# Patient Record
Sex: Male | Born: 1940
Health system: Southern US, Community
[De-identification: ages and names within clinical notes are randomized; demographics above are authoritative.]

## PROBLEM LIST (undated history)

## (undated) DIAGNOSIS — T7840XA Allergy, unspecified, initial encounter: Secondary | ICD-10-CM

## (undated) DIAGNOSIS — H409 Unspecified glaucoma: Secondary | ICD-10-CM

## (undated) DIAGNOSIS — R040 Epistaxis: Secondary | ICD-10-CM

## (undated) DIAGNOSIS — L309 Dermatitis, unspecified: Secondary | ICD-10-CM

## (undated) DIAGNOSIS — N281 Cyst of kidney, acquired: Secondary | ICD-10-CM

## (undated) DIAGNOSIS — K219 Gastro-esophageal reflux disease without esophagitis: Secondary | ICD-10-CM

## (undated) DIAGNOSIS — M5431 Sciatica, right side: Secondary | ICD-10-CM

## (undated) DIAGNOSIS — J101 Influenza due to other identified influenza virus with other respiratory manifestations: Secondary | ICD-10-CM

## (undated) DIAGNOSIS — E669 Obesity, unspecified: Secondary | ICD-10-CM

## (undated) DIAGNOSIS — Z8709 Personal history of other diseases of the respiratory system: Secondary | ICD-10-CM

## (undated) DIAGNOSIS — H8110 Benign paroxysmal vertigo, unspecified ear: Secondary | ICD-10-CM

## (undated) DIAGNOSIS — K760 Fatty (change of) liver, not elsewhere classified: Secondary | ICD-10-CM

## (undated) DIAGNOSIS — I499 Cardiac arrhythmia, unspecified: Secondary | ICD-10-CM

## (undated) DIAGNOSIS — I493 Ventricular premature depolarization: Secondary | ICD-10-CM

## (undated) DIAGNOSIS — R413 Other amnesia: Secondary | ICD-10-CM

## (undated) DIAGNOSIS — Z87442 Personal history of urinary calculi: Secondary | ICD-10-CM

## (undated) DIAGNOSIS — Z860100 Personal history of colon polyps, unspecified: Secondary | ICD-10-CM

## (undated) DIAGNOSIS — Z973 Presence of spectacles and contact lenses: Secondary | ICD-10-CM

## (undated) DIAGNOSIS — Z974 Presence of external hearing-aid: Secondary | ICD-10-CM

## (undated) DIAGNOSIS — J45909 Unspecified asthma, uncomplicated: Secondary | ICD-10-CM

## (undated) DIAGNOSIS — N400 Enlarged prostate without lower urinary tract symptoms: Secondary | ICD-10-CM

## (undated) DIAGNOSIS — H269 Unspecified cataract: Secondary | ICD-10-CM

## (undated) DIAGNOSIS — G4730 Sleep apnea, unspecified: Secondary | ICD-10-CM

## (undated) DIAGNOSIS — M1990 Unspecified osteoarthritis, unspecified site: Secondary | ICD-10-CM

## (undated) DIAGNOSIS — I2510 Atherosclerotic heart disease of native coronary artery without angina pectoris: Secondary | ICD-10-CM

## (undated) DIAGNOSIS — I10 Essential (primary) hypertension: Secondary | ICD-10-CM

## (undated) DIAGNOSIS — E349 Endocrine disorder, unspecified: Secondary | ICD-10-CM

## (undated) DIAGNOSIS — N200 Calculus of kidney: Secondary | ICD-10-CM

## (undated) DIAGNOSIS — N3281 Overactive bladder: Secondary | ICD-10-CM

## (undated) DIAGNOSIS — Z8719 Personal history of other diseases of the digestive system: Secondary | ICD-10-CM

## (undated) DIAGNOSIS — E785 Hyperlipidemia, unspecified: Secondary | ICD-10-CM

## (undated) DIAGNOSIS — K5790 Diverticulosis of intestine, part unspecified, without perforation or abscess without bleeding: Secondary | ICD-10-CM

## (undated) HISTORY — DX: Gastro-esophageal reflux disease without esophagitis: K21.9

## (undated) HISTORY — DX: Allergy, unspecified, initial encounter: T78.40XA

## (undated) HISTORY — DX: Atherosclerotic heart disease of native coronary artery without angina pectoris: I25.10

## (undated) HISTORY — DX: Presence of external hearing-aid: Z97.4

## (undated) HISTORY — DX: Unspecified cataract: H26.9

## (undated) HISTORY — DX: Essential (primary) hypertension: I10

## (undated) HISTORY — DX: Unspecified glaucoma: H40.9

## (undated) HISTORY — DX: Other amnesia: R41.3

## (undated) HISTORY — DX: Sleep apnea, unspecified: G47.30

## (undated) HISTORY — PX: OTHER SURGICAL HISTORY: SHX169

## (undated) HISTORY — DX: Cardiac arrhythmia, unspecified: I49.9

## (undated) HISTORY — PX: VASECTOMY: SHX75

## (undated) HISTORY — PX: COLONOSCOPY: SHX174

## (undated) HISTORY — PX: LITHOTRIPSY: SUR834

## (undated) HISTORY — PX: APPENDECTOMY: SHX54

---
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
## 2005-06-23 ENCOUNTER — Other Ambulatory Visit: Payer: Self-pay

## 2005-06-23 ENCOUNTER — Emergency Department: Payer: Self-pay | Admitting: Emergency Medicine

## 2005-11-11 ENCOUNTER — Ambulatory Visit: Payer: Self-pay | Admitting: Gastroenterology

## 2005-11-11 LAB — HM COLONOSCOPY: HM Colonoscopy: NORMAL

## 2006-09-01 ENCOUNTER — Ambulatory Visit: Payer: Self-pay | Admitting: Family Medicine

## 2006-09-07 ENCOUNTER — Ambulatory Visit: Payer: Self-pay | Admitting: Family Medicine

## 2006-09-07 LAB — CONVERTED CEMR LAB
ALT: 16 units/L (ref 0–40)
AST: 21 units/L (ref 0–37)
Alkaline Phosphatase: 76 units/L (ref 39–117)
Calcium: 9.6 mg/dL (ref 8.4–10.5)
Cholesterol: 171 mg/dL (ref 0–200)
GFR calc Af Amer: 65 mL/min
Glucose, Bld: 104 mg/dL — ABNORMAL HIGH (ref 70–99)
HDL: 32.4 mg/dL — ABNORMAL LOW (ref >39.0)
LDL Cholesterol: 107 mg/dL — ABNORMAL HIGH (ref 0–99)
Potassium: 4.2 meq/L (ref 3.5–5.1)
Sodium: 140 meq/L (ref 135–145)
Total Protein: 7.3 g/dL (ref 6.0–8.3)
VLDL: 32 mg/dL (ref 0–40)

## 2006-12-20 ENCOUNTER — Emergency Department (HOSPITAL_COMMUNITY): Admission: EM | Admit: 2006-12-20 | Discharge: 2006-12-20 | Payer: Self-pay | Admitting: Family Medicine

## 2007-02-16 ENCOUNTER — Ambulatory Visit: Payer: Self-pay | Admitting: *Deleted

## 2007-02-16 DIAGNOSIS — L299 Pruritus, unspecified: Secondary | ICD-10-CM | POA: Insufficient documentation

## 2007-02-16 DIAGNOSIS — E781 Pure hyperglyceridemia: Secondary | ICD-10-CM

## 2007-02-16 DIAGNOSIS — M1990 Unspecified osteoarthritis, unspecified site: Secondary | ICD-10-CM | POA: Insufficient documentation

## 2007-02-16 DIAGNOSIS — E6609 Other obesity due to excess calories: Secondary | ICD-10-CM | POA: Insufficient documentation

## 2007-02-16 DIAGNOSIS — K219 Gastro-esophageal reflux disease without esophagitis: Secondary | ICD-10-CM

## 2007-02-16 DIAGNOSIS — J45909 Unspecified asthma, uncomplicated: Secondary | ICD-10-CM | POA: Insufficient documentation

## 2007-02-16 DIAGNOSIS — Z6832 Body mass index (BMI) 32.0-32.9, adult: Secondary | ICD-10-CM | POA: Insufficient documentation

## 2007-02-16 DIAGNOSIS — N400 Enlarged prostate without lower urinary tract symptoms: Secondary | ICD-10-CM | POA: Insufficient documentation

## 2007-02-16 DIAGNOSIS — Q809 Congenital ichthyosis, unspecified: Secondary | ICD-10-CM

## 2007-02-16 DIAGNOSIS — I10 Essential (primary) hypertension: Secondary | ICD-10-CM | POA: Insufficient documentation

## 2007-02-16 DIAGNOSIS — E669 Obesity, unspecified: Secondary | ICD-10-CM

## 2007-02-17 LAB — CONVERTED CEMR LAB
PSA: 3.28 ng/mL (ref 0.10–4.00)
Total Bilirubin: 0.8 mg/dL (ref 0.3–1.2)
Total Protein: 7.6 g/dL (ref 6.0–8.3)

## 2007-03-01 ENCOUNTER — Telehealth: Payer: Self-pay | Admitting: Family Medicine

## 2007-04-07 ENCOUNTER — Ambulatory Visit: Payer: Self-pay | Admitting: Family Medicine

## 2007-04-07 DIAGNOSIS — I10 Essential (primary) hypertension: Secondary | ICD-10-CM | POA: Insufficient documentation

## 2007-04-07 DIAGNOSIS — H918X9 Other specified hearing loss, unspecified ear: Secondary | ICD-10-CM | POA: Insufficient documentation

## 2007-04-07 LAB — CONVERTED CEMR LAB
HDL goal, serum: 40 mg/dL
LDL Goal: 130 mg/dL

## 2007-08-03 ENCOUNTER — Emergency Department: Payer: Self-pay | Admitting: Emergency Medicine

## 2007-08-10 ENCOUNTER — Telehealth: Payer: Self-pay | Admitting: Family Medicine

## 2007-08-16 ENCOUNTER — Ambulatory Visit: Payer: Self-pay | Admitting: Family Medicine

## 2007-08-17 ENCOUNTER — Encounter (INDEPENDENT_AMBULATORY_CARE_PROVIDER_SITE_OTHER): Payer: Self-pay | Admitting: Internal Medicine

## 2007-09-12 ENCOUNTER — Encounter (INDEPENDENT_AMBULATORY_CARE_PROVIDER_SITE_OTHER): Payer: Self-pay | Admitting: *Deleted

## 2007-09-14 ENCOUNTER — Ambulatory Visit: Payer: Self-pay | Admitting: Family Medicine

## 2007-09-15 ENCOUNTER — Encounter: Admission: RE | Admit: 2007-09-15 | Discharge: 2007-09-15 | Payer: Self-pay | Admitting: Family Medicine

## 2007-09-16 ENCOUNTER — Ambulatory Visit: Payer: Self-pay | Admitting: Family Medicine

## 2007-09-21 DIAGNOSIS — E349 Endocrine disorder, unspecified: Secondary | ICD-10-CM | POA: Insufficient documentation

## 2007-09-21 LAB — CONVERTED CEMR LAB
ALT: 15 units/L (ref 0–53)
Basophils Relative: 0.2 % (ref 0.0–1.0)
Bilirubin, Direct: 0.1 mg/dL (ref 0.0–0.3)
CO2: 30 meq/L (ref 19–32)
Calcium: 9.6 mg/dL (ref 8.4–10.5)
Eosinophils Relative: 2.7 % (ref 0.0–5.0)
GFR calc Af Amer: 86 mL/min
Glucose, Bld: 96 mg/dL (ref 70–99)
HCT: 41.9 % (ref 39.0–52.0)
Hemoglobin: 13.9 g/dL (ref 13.0–17.0)
Lymphocytes Relative: 21.5 % (ref 12.0–46.0)
Monocytes Absolute: 0.5 10*3/uL (ref 0.2–0.7)
Neutro Abs: 5 10*3/uL (ref 1.4–7.7)
Neutrophils Relative %: 69 % (ref 43.0–77.0)
Potassium: 4.2 meq/L (ref 3.5–5.1)
Sodium: 142 meq/L (ref 135–145)
TSH: 0.87 microintl units/mL (ref 0.35–5.50)
Total Protein: 7.5 g/dL (ref 6.0–8.3)
VLDL: 24 mg/dL (ref 0–40)
Vitamin B-12: 316 pg/mL (ref 211–911)
WBC: 7.2 10*3/uL (ref 4.5–10.5)

## 2007-12-09 ENCOUNTER — Ambulatory Visit: Payer: Self-pay | Admitting: Family Medicine

## 2007-12-09 DIAGNOSIS — E7800 Pure hypercholesterolemia, unspecified: Secondary | ICD-10-CM | POA: Insufficient documentation

## 2007-12-16 LAB — CONVERTED CEMR LAB
ALT: 13 units/L (ref 0–53)
AST: 17 units/L (ref 0–37)
LDL Cholesterol: 117 mg/dL — ABNORMAL HIGH (ref 0–99)
VLDL: 23 mg/dL (ref 0–40)

## 2008-03-01 ENCOUNTER — Telehealth: Payer: Self-pay | Admitting: Family Medicine

## 2008-03-16 ENCOUNTER — Ambulatory Visit: Payer: Self-pay | Admitting: Family Medicine

## 2008-03-19 ENCOUNTER — Ambulatory Visit: Payer: Self-pay | Admitting: Family Medicine

## 2008-03-21 LAB — CONVERTED CEMR LAB
ALT: 13 units/L (ref 0–53)
AST: 18 units/L (ref 0–37)
Bilirubin, Direct: 0.1 mg/dL (ref 0.0–0.3)
HDL: 27.5 mg/dL — ABNORMAL LOW (ref >39.0)
Total Bilirubin: 1.2 mg/dL (ref 0.3–1.2)

## 2008-05-28 ENCOUNTER — Telehealth: Payer: Self-pay | Admitting: Family Medicine

## 2008-05-30 ENCOUNTER — Encounter (INDEPENDENT_AMBULATORY_CARE_PROVIDER_SITE_OTHER): Payer: Self-pay | Admitting: *Deleted

## 2008-06-02 ENCOUNTER — Emergency Department (HOSPITAL_COMMUNITY): Admission: EM | Admit: 2008-06-02 | Discharge: 2008-06-02 | Payer: Self-pay | Admitting: Psychology

## 2008-06-04 ENCOUNTER — Telehealth: Payer: Self-pay | Admitting: Family Medicine

## 2008-06-12 ENCOUNTER — Ambulatory Visit: Payer: Self-pay | Admitting: Family Medicine

## 2008-06-14 ENCOUNTER — Telehealth: Payer: Self-pay | Admitting: Family Medicine

## 2008-09-20 ENCOUNTER — Ambulatory Visit: Payer: Self-pay | Admitting: Family Medicine

## 2008-09-24 LAB — CONVERTED CEMR LAB
Alkaline Phosphatase: 93 units/L (ref 39–117)
Bilirubin, Direct: 0.1 mg/dL (ref 0.0–0.3)
GFR calc Af Amer: 86 mL/min
GFR calc non Af Amer: 71 mL/min
LDL Cholesterol: 122 mg/dL — ABNORMAL HIGH (ref 0–99)
Potassium: 4.3 meq/L (ref 3.5–5.1)
Sodium: 142 meq/L (ref 135–145)
VLDL: 17 mg/dL (ref 0–40)

## 2008-09-27 ENCOUNTER — Ambulatory Visit: Payer: Self-pay | Admitting: Family Medicine

## 2008-10-13 ENCOUNTER — Telehealth: Payer: Self-pay | Admitting: Internal Medicine

## 2009-01-08 ENCOUNTER — Encounter: Payer: Self-pay | Admitting: Family Medicine

## 2009-01-16 ENCOUNTER — Encounter: Payer: Self-pay | Admitting: Family Medicine

## 2009-01-23 ENCOUNTER — Encounter: Payer: Self-pay | Admitting: Family Medicine

## 2009-02-01 ENCOUNTER — Ambulatory Visit: Payer: Self-pay | Admitting: Family Medicine

## 2009-02-21 ENCOUNTER — Telehealth: Payer: Self-pay | Admitting: Family Medicine

## 2009-04-03 ENCOUNTER — Encounter (INDEPENDENT_AMBULATORY_CARE_PROVIDER_SITE_OTHER): Payer: Self-pay | Admitting: *Deleted

## 2009-04-05 ENCOUNTER — Ambulatory Visit: Payer: Self-pay | Admitting: Family Medicine

## 2009-04-08 ENCOUNTER — Ambulatory Visit: Payer: Self-pay | Admitting: Family Medicine

## 2009-04-08 LAB — CONVERTED CEMR LAB
Albumin: 4 g/dL (ref 3.5–5.2)
Alkaline Phosphatase: 77 units/L (ref 39–117)
Bilirubin, Direct: 0.2 mg/dL (ref 0.0–0.3)
Calcium: 9.1 mg/dL (ref 8.4–10.5)
GFR calc non Af Amer: 70.74 mL/min (ref >60)
Glucose, Bld: 89 mg/dL (ref 70–99)
HDL: 36.9 mg/dL — ABNORMAL LOW (ref >39.00)
Sodium: 141 meq/L (ref 135–145)
Total CHOL/HDL Ratio: 5
VLDL: 17 mg/dL (ref 0.0–40.0)

## 2009-04-09 ENCOUNTER — Telehealth: Payer: Self-pay | Admitting: Family Medicine

## 2009-04-19 ENCOUNTER — Encounter: Payer: Self-pay | Admitting: Family Medicine

## 2009-06-05 ENCOUNTER — Ambulatory Visit: Payer: Self-pay | Admitting: Family Medicine

## 2009-07-04 ENCOUNTER — Ambulatory Visit: Payer: Self-pay | Admitting: Family Medicine

## 2009-07-23 ENCOUNTER — Encounter: Payer: Self-pay | Admitting: Family Medicine

## 2009-09-11 ENCOUNTER — Encounter (INDEPENDENT_AMBULATORY_CARE_PROVIDER_SITE_OTHER): Payer: Self-pay | Admitting: *Deleted

## 2009-09-12 ENCOUNTER — Telehealth (INDEPENDENT_AMBULATORY_CARE_PROVIDER_SITE_OTHER): Payer: Self-pay | Admitting: *Deleted

## 2009-09-27 ENCOUNTER — Ambulatory Visit: Payer: Self-pay | Admitting: Family Medicine

## 2009-09-27 LAB — CONVERTED CEMR LAB
Albumin: 4.2 g/dL (ref 3.5–5.2)
BUN: 12 mg/dL (ref 6–23)
Bilirubin, Direct: 0.2 mg/dL (ref 0.0–0.3)
CO2: 28 meq/L (ref 19–32)
Calcium: 9.2 mg/dL (ref 8.4–10.5)
Cholesterol: 189 mg/dL (ref 0–200)
Creatinine, Ser: 1.1 mg/dL (ref 0.4–1.5)
Glucose, Bld: 94 mg/dL (ref 70–99)
HDL: 42.1 mg/dL (ref >39.00)
Total CHOL/HDL Ratio: 4
Total Protein: 7.3 g/dL (ref 6.0–8.3)
Triglycerides: 95 mg/dL (ref 0.0–149.0)

## 2009-10-01 ENCOUNTER — Ambulatory Visit: Payer: Self-pay | Admitting: Family Medicine

## 2009-11-08 ENCOUNTER — Encounter: Payer: Self-pay | Admitting: Family Medicine

## 2009-11-29 ENCOUNTER — Ambulatory Visit (HOSPITAL_BASED_OUTPATIENT_CLINIC_OR_DEPARTMENT_OTHER): Admission: RE | Admit: 2009-11-29 | Discharge: 2009-11-30 | Payer: Self-pay | Admitting: Urology

## 2009-11-29 HISTORY — PX: TRANSURETHRAL RESECTION OF PROSTATE: SHX73

## 2009-12-23 ENCOUNTER — Encounter: Payer: Self-pay | Admitting: Family Medicine

## 2010-04-01 ENCOUNTER — Ambulatory Visit: Payer: Self-pay | Admitting: Family Medicine

## 2010-04-07 LAB — CONVERTED CEMR LAB
ALT: 21 units/L (ref 0–53)
Albumin: 4.3 g/dL (ref 3.5–5.2)
BUN: 16 mg/dL (ref 6–23)
CO2: 24 meq/L (ref 19–32)
Calcium: 9.3 mg/dL (ref 8.4–10.5)
Chloride: 104 meq/L (ref 96–112)
Cholesterol: 181 mg/dL (ref 0–200)
Creatinine, Ser: 1 mg/dL (ref 0.4–1.5)
Glucose, Bld: 84 mg/dL (ref 70–99)
HDL: 37.1 mg/dL — ABNORMAL LOW (ref >39.00)
Total Protein: 6.8 g/dL (ref 6.0–8.3)
Triglycerides: 83 mg/dL (ref 0.0–149.0)

## 2010-07-08 ENCOUNTER — Encounter: Payer: Self-pay | Admitting: Family Medicine

## 2010-08-24 HISTORY — PX: ROTATOR CUFF REPAIR: SHX139

## 2010-08-26 ENCOUNTER — Ambulatory Visit
Admission: RE | Admit: 2010-08-26 | Discharge: 2010-08-26 | Payer: Self-pay | Source: Home / Self Care | Attending: Internal Medicine | Admitting: Internal Medicine

## 2010-09-23 NOTE — Progress Notes (Signed)
Summary: lab orderd for 09/12/09  ---- Converted from flag ---- ---- 09/11/2009 1:08 PM, Kerby Nora MD wrote: Dx 272.0 lipids, CMET.  ---- 09/10/2009 8:39 AM, Liane Comber CMA (AAMA) wrote: Pt is scheduled for cpx labs tomorrow, what labs to draw and dx codes? Thanks Tasha ------------------------------

## 2010-09-23 NOTE — Letter (Signed)
Summary: [facility]  [facility]   Imported By: Lanelle Bal 01/09/2010 09:53:35  _____________________________________________________________________  External Attachment:    Type:   Image     Comment:   External Document

## 2010-09-23 NOTE — Letter (Signed)
Summary: [place] No Show Letter  [place] [facility]  [address] [place], [place] [address]   Phone: [phone]  Fax: [phone]    09/11/2009 MRN: [id number]  [facility] [address] [place], [place]  [address]   Dear Mr. Dethloff,   Our records indicate that you missed your scheduled appointment with ____Lab_________________ on _11.19.11___________.  Please contact this office to reschedule your appointment as soon as possible.  It is important that you keep your scheduled appointments with your physician, so we can provide you the best care possible.  Please be advised that there may be a charge for "no show" appointments.    Sincerely,   [place] at [facility]

## 2010-09-23 NOTE — Letter (Signed)
Summary: [facility]  [facility]   Imported By: Maryln Gottron 11/12/2009 15:38:38  _____________________________________________________________________  External Attachment:    Type:   Image     Comment:   External Document

## 2010-09-23 NOTE — Letter (Signed)
Summary: [facility]  [facility]   Imported By: Maryln Gottron 07/18/2010 09:55:31  _____________________________________________________________________  External Attachment:    Type:   Image     Comment:   External Document

## 2010-09-23 NOTE — Assessment & Plan Note (Signed)
Summary: cpx/dlo   Vital Signs:  Patient profile:   70 year old male Weight:      214 pounds BMI:     30.82 Temp:     98.0 degrees F oral Pulse rate:   64 / minute Pulse rhythm:   regular BP sitting:   130 / 80  (left arm) Cuff size:   large CC: 30 minute exam, Hypertension Management   History of Present Illness: Nasal discharge, post nasal drip x 6 days. Not worseing, getting some better.  No sinus pain, no ear pain. No ST. No fever.   Recent skin exam at [facility]. Given eczema. Crypotherapy for AKs.   BPH..increasing issues with urinary urgency.  Sees Dr. Hillis Range..considering TURP for treatment. Stable PSA.. bx 1 year ago negative.   High chol.Conchita Paris well controlled on gemfibrozil and HDL increasing on niaspan. Some weight gain over holidays.Marland Kitchenexercsiing regularly.   Hypertension History:      He denies headache, chest pain, palpitations, syncope, and side effects from treatment.  Well controlled at home.        Positive major cardiovascular risk factors include male age 70 years old or older, hyperlipidemia, and hypertension.  Negative major cardiovascular risk factors include negative family history for ischemic heart disease and non-tobacco-user status.     Problems Prior to Update: 1)  External Hemorrhoids With Other Complication  (ICD-455.5) 2)  Cough  (ICD-786.2) 3)  Hemorrhoid, External, Thrombosed  (ICD-455.4) 4)  Back Pain, Upper  (ICD-724.5) 5)  Hypercholesterolemia  (ICD-272.0) 6)  Testosterone Deficiency  (ICD-257.2) 7)  Other Malaise and Fatigue  (ICD-780.79) 8)  Screening For Lipoid Disorders  (ICD-V77.91) 9)  Testicular Pain, Right  (ICD-608.9) 10)  Encounter For Removal of Sutures  (ICD-V58.32) 11)  Loss, Hearing Nec  (ICD-389.8) 12)  Onychomycosis, Toenails  (ICD-110.1) 13)  B P H  (ICD-600.01) 14)  Screening For Malignant Neoplasm, Prostate  (ICD-V76.44) 15)  Ichthyosis  (ICD-757.1) 16)  Hypertriglyceridemia  (ICD-272.1) 17)  Asthma, Childhood   (ICD-493.00) 18)  Obesity  (ICD-278.00) 19)  Osteoarthritis  (ICD-715.90) 20)  Hypertension  (ICD-401.9) 21)  Gerd  (ICD-530.81)  Current Medications (verified): 1)  Benicar 20 Mg  Tabs (Olmesartan Medoxomil) .... Take 1 Tablet By Mouth Once A Day 2)  Aspirin Childrens 81 Mg Chew (Aspirin) .... Take 1 Tablet By Mouth Once A Day 3)  Gemfibrozil 600 Mg Tabs (Gemfibrozil) .... Take 1 Tablet By Mouth Once A Day 4)  Niaspan 1000 Mg  Tbcr (Niacin (Antihyperlipidemic)) .... Take 1 Tablet By Mouth Twice A Day At Bedtime 5)  Testim 1 %  Gel (Testosterone) .... Once Daily 6)  Jalyn 0.5-0.4 Mg Caps (Dutasteride-Tamsulosin Hcl) .... Once A Day 7)  Hycodan Syrup .Marland Kitchen.. 1 - 2 Tsp By Mouth At Bedtime As Needed Cough 8)  Proair Hfa 108 (90 Base) Mcg/act  Aers (Albuterol Sulfate) .... 2 Inh Q4h As Needed Shortness of Breath  Allergies: 1)  Paxil  Past History:  Past medical, surgical, family and social histories (including risk factors) reviewed, and no changes noted (except as noted below).  Past Medical History: Reviewed history from 09/14/2007 and no changes required. GERD Hypertension Osteoarthritis  Past Surgical History: Reviewed history from 09/27/2008 and no changes required. Vasectomy 1976 Stress negative 06/2005 ECHO 06/2005 Lithotripsy- left kidney x 2 2003 bone spur removal  2008 Left and right foot 1st digit external hemmorhoid thrombose...irrigated 01/2008  Family History: Reviewed history from 02/16/2007 and no changes required. Father: Died at age 84, lung cancer  Mother: Alive with breast cancer and kidney cancer Siblings: Twin, who is overweight Grandparent CVA, CHF  Social History: Reviewed history from 02/16/2007 and no changes required. Marital Status: Married Children: 3 Occupation: Acupuncturist  Review of Systems General:  Denies fatigue and fever. CV:  Denies chest pain or discomfort. Resp:  Denies shortness of breath. GI:  Denies abdominal pain. GU:   Denies dysuria.  Physical Exam  General:  Well-developed,well-nourished,in no acute distress; alert,appropriate and cooperative throughout examination Ears:  External ear exam shows no significant lesions or deformities.  Otoscopic examination reveals clear canals, tympanic membranes are intact bilaterally without bulging, retraction, inflammation or discharge. Hearing is grossly normal bilaterally. Nose:  nasal dischargemucosal pallor.   Mouth:  Oral mucosa and oropharynx without lesions or exudates.  Teeth in good repair. MMM Neck:  no carotid bruit or thyromegaly no cervical or supraclavicular lymphadenopathy  Lungs:  Normal respiratory effort, chest expands symmetrically. Lungs are clear to auscultation, no crackles or wheezes. Heart:  Normal rate and regular rhythm. S1 and S2 normal without gallop, murmur, click, rub or other extra sounds. Abdomen:  Bowel sounds positive,abdomen soft and non-tender without masses, organomegaly or hernias noted. Prostate:  per uro Msk:  No deformity or scoliosis noted of thoracic or lumbar spine.   Pulses:  R and L posterior tibial pulses are full and equal bilaterally  Extremities:  no edema  Skin:  Intact without suspicious lesions or rashes Psych:  Cognition and judgment appear intact. Alert and cooperative with normal attention span and concentration. No apparent delusions, illusions, hallucinations   Impression & Recommendations:  Problem # 1:  HYPERCHOLESTEROLEMIA (ICD-272.0) Improved on current regimen with lifestyle change.  His updated medication list for this problem includes:    Gemfibrozil 600 Mg Tabs (Gemfibrozil) .Marland Kitchen... Take 1 tablet by mouth once a day    Niaspan 1000 Mg Tbcr (Niacin (antihyperlipidemic)) .Marland Kitchen... Take 1 tablet by mouth twice a day at bedtime  Labs Reviewed: SGOT: 22 (09/27/2009)   SGPT: 19 (09/27/2009)  Lipid Goals: Chol Goal: 200 (04/08/2009)   HDL Goal: 40 (04/07/2007)   LDL Goal: 130 (04/07/2007)   TG Goal: 150  (04/07/2007)  Prior 10 Yr Risk Heart Disease: 22 % (04/08/2009)   HDL:42.10 (09/27/2009), 36.90 (04/05/2009)  LDL:128 (09/27/2009), 128 (04/05/2009)  Chol:189 (09/27/2009), 182 (04/05/2009)  Trig:95.0 (09/27/2009), 85.0 (04/05/2009)  Problem # 2:  HYPERTENSION (ICD-401.9) Well controlled. Continue current medication.  His updated medication list for this problem includes:    Benicar 20 Mg Tabs (Olmesartan medoxomil) .Marland Kitchen... Take 1 tablet by mouth once a day  BP today: 130/80 Prior BP: 140/88 (07/04/2009)  Prior 10 Yr Risk Heart Disease: 22 % (04/08/2009)  Labs Reviewed: K+: 4.4 (09/27/2009) Creat: : 1.1 (09/27/2009)   Chol: 189 (09/27/2009)   HDL: 42.10 (09/27/2009)   LDL: 128 (09/27/2009)   TG: 95.0 (09/27/2009)  Problem # 3:  B P H (ICD-600.01) Surgery planned per [facility].   Problem # 4:  Preventive Health Care (ICD-V70.0) Assessment: Comment Only Reviewed preventive care protocols, scheduled due services, and updated immunizations.   Complete Medication List: 1)  Benicar 20 Mg Tabs (Olmesartan medoxomil) .... Take 1 tablet by mouth once a day 2)  Aspirin Childrens 81 Mg Chew (Aspirin) .... Take 1 tablet by mouth once a day 3)  Gemfibrozil 600 Mg Tabs (Gemfibrozil) .... Take 1 tablet by mouth once a day 4)  Niaspan 1000 Mg Tbcr (Niacin (antihyperlipidemic)) .... Take 1 tablet by mouth twice a day at bedtime 5)  Testim 1 % Gel (Testosterone) .... Once daily 6)  Jalyn 0.5-0.4 Mg Caps (Dutasteride-tamsulosin hcl) .... Once a day 7)  Hycodan Syrup  .Marland Kitchen.. 1 - 2 tsp by mouth at bedtime as needed cough 8)  Proair Hfa 108 (90 Base) Mcg/act Aers (Albuterol sulfate) .... 2 inh q4h as needed shortness of breath  Hypertension Assessment/Plan:      The patient's hypertensive risk group is category B: At least one risk factor (excluding diabetes) with no target organ damage.  His calculated 10 year risk of coronary heart disease is 22 %.  Today's blood pressure is 130/80.  His blood pressure goal  is < 140/90.  Patient Instructions: 1)  Fasitng lipids, CMET Dx 272.0 in 6 months.  2)  Please schedule a follow-up appointment in 1 year.   Current Allergies (reviewed today): PAXIL   Past Medical History:    Reviewed history from 09/14/2007 and no changes required:       GERD       Hypertension       Osteoarthritis  Past Surgical History:    Reviewed history from 09/27/2008 and no changes required:       Vasectomy 1976       Stress negative 06/2005       ECHO 06/2005       Lithotripsy- left kidney x 2 2003       bone spur removal  2008 Left and right foot 1st digit       external hemmorhoid thrombose...irrigated 01/2008

## 2010-09-25 NOTE — Assessment & Plan Note (Signed)
Summary: cold symptoms/alc   Vital Signs:  Patient profile:   70 year old male Height:      70 inches Weight:      217 pounds BMI:     31.25 Temp:     98.2 degrees F oral Pulse rate:   84 / minute Pulse rhythm:   regular BP sitting:   130 / 82  (left arm) Cuff size:   large  Vitals Entered By: Selena Batten Dance CMA (AAMA) (August 26, 2010 3:37 PM) CC: Cold symptom   History of Present Illness: CC: cold sxs, blood pressure "my main concern is blood pressure today"  1. blood pressure - has been running 155-160/90s at home.  Has gained a few lbs over holidays.  was on benicar/hctz now only benicar.  recently in pain and increase in NSAIDs.  no HA, vision changes, SOB, LE swelling.  2. cold - 1 wk h/o cold.  + sick contacts.  minimally productive cough.  Simple mucinex.  No fevers/chills.  worse at night.  + h/o asthma.  feels a bit tight in chest/back currently.  Tightness comes and goes.  Nothing relieves it.  Not exhertional.  doesn't happen when walking.  quit smoking remotely.  LDL last checked 03/2010, 127.  father with CAD/MI 70yo, CABG.  3. constant pain from torn rotator cuff L shoulder, seeing [place] [facility]. to see surgeon next week.  s/p [place] and minor tear dx.  vicodin at night helps him sleep.  during day taking alleve 1 in am and 1 in pm.  Cardiovascular Risk History:      Positive major cardiovascular risk factors include male age 87 years old or older, hyperlipidemia, and hypertension.  Negative major cardiovascular risk factors include no history of diabetes, negative family history for ischemic heart disease, and non-tobacco-user status.        Further assessment for target organ damage reveals no history of stroke/TIA, peripheral vascular disease, renal insufficiency, or hypertensive retinopathy.     Current Medications (verified): 1)  Benicar 20 Mg  Tabs (Olmesartan Medoxomil) .... Take 1 Tablet By Mouth Once A Day 2)  Aspirin Childrens 81 Mg Chew (Aspirin) .... Take 1 Tablet By  Mouth Once A Day 3)  Gemfibrozil 600 Mg Tabs (Gemfibrozil) .... Take 1 Tablet By Mouth Once A Day 4)  Niaspan 1000 Mg  Tbcr (Niacin (Antihyperlipidemic)) .... Take 1 Tablet By Mouth Twice A Day At Bedtime 5)  Testim 1 %  Gel (Testosterone) .... Once Daily 6)  Proair Hfa 108 (90 Base) Mcg/act  Aers (Albuterol Sulfate) .... 2 Inh Q4h As Needed Shortness of Breath  Allergies: 1)  Paxil  Past History:  Social History: Last updated: 02/16/2007 Marital Status: Married Children: 3 Occupation: Acupuncturist  Past Medical History: GERD Hypertension Osteoarthritis h/o asthma  Family History: Father: Died at age 58, lung cancer, CABG Mother: Alive with breast cancer and kidney cancer Siblings: Twin, who is overweight Grandparent CVA, CHF  Review of Systems       per HPI  Physical Exam  General:  Well-developed,well-nourished,in no acute distress; alert,appropriate and cooperative throughout examination Head:  no maxially sinus ttp Eyes:  No corneal or conjunctival inflammation noted. EOMI. Perrla.  Ears:  L TM clear R TM clear, significant amt cerumen Nose:  mucosal pallor Mouth:  MMM, no pharyngeal erythema/exudates Neck:  no carotid bruit or thyromegaly no cervical or supraclavicular lymphadenopathy  Lungs:  Normal respiratory effort, chest expands symmetrically. Lungs are clear to auscultation, no crackles  or wheezes. Heart:  Normal rate and regular rhythm. S1 and S2 normal without gallop, murmur, click, rub or other extra sounds. Pulses:  2+ rad pulses, brisk cap refill Extremities:  no edema  Skin:  Intact without suspicious lesions or rashes   Impression & Recommendations:  Problem # 1:  VIRAL URI (ICD-465.9) supportive care as per pt instructions.  lungs clear today, no asthma flare today.  chest/back tightness more consistent with pain from RTC tear, MSK rather than cardiac.  His updated medication list for this problem includes:    Aspirin Childrens 81 Mg  Chew (Aspirin) .Marland Kitchen... Take 1 tablet by mouth once a day  Problem # 2:  CERUMEN IMPACTION, RIGHT (ICD-380.4) attempted cleaning with plastic curette, however cerumen too thick.  successful irrigation  Orders: Cerumen Impaction Removal (11914)  Problem # 3:  HYPERTENSION (ICD-401.9) no changes currently, at goal.  discussed factor that pain and NSAIDs play on elevated BP.  f/u with pcp.   His updated medication list for this problem includes:    Benicar 20 Mg Tabs (Olmesartan medoxomil) .Marland Kitchen... Take 1 tablet by mouth once a day  BP today: 130/82 Prior BP: 130/80 (10/01/2009)  Prior 10 Yr Risk Heart Disease: 22 % (04/08/2009)  Labs Reviewed: K+: 4.8 (04/01/2010) Creat: : 1.0 (04/01/2010)   Chol: 181 (04/01/2010)   HDL: 37.10 (04/01/2010)   LDL: 127 (04/01/2010)   TG: 83.0 (04/01/2010)  Complete Medication List: 1)  Benicar 20 Mg Tabs (Olmesartan medoxomil) .... Take 1 tablet by mouth once a day 2)  Aspirin Childrens 81 Mg Chew (Aspirin) .... Take 1 tablet by mouth once a day 3)  Gemfibrozil 600 Mg Tabs (Gemfibrozil) .... Take 1 tablet by mouth once a day 4)  Niaspan 1000 Mg Tbcr (Niacin (antihyperlipidemic)) .... Take 1 tablet by mouth twice a day at bedtime 5)  Testim 1 % Gel (Testosterone) .... Once daily 6)  Proair Hfa 108 (90 Base) Mcg/act Aers (Albuterol sulfate) .... 2 inh q4h as needed shortness of breath  Cardiovascular Risk Assessment/Plan:      The patient's hypertensive risk group is category B: At least one risk factor (excluding diabetes) with no target organ damage.  His calculated 10 year risk of coronary heart disease is 22 %.  Today's blood pressure is 130/82.  His blood pressure goal is < 140/90.  Patient Instructions: 1)  better pain control may help with blood pressures 2)  changing from alleve to tylenol may help blood pressure control.  (watch out because vicodin has tylenol, max dose is 3 gm/day). 3)  Sounds like you have a viral upper respiratory infection. 4)   Antibiotics are not needed for this.  Viral infections usually take 7-10 days to resolve.  The cough can last 4 weeks to go away. 5)  Continue mucinex with plenty of fluid, get plenty of rest. 6)  Please return if you are not improving as expected, or if you have high fevers (>101.5) or difficulty swallowing. 7)  Call clinic with questions.  Pleasure to see you today!    Orders Added: 1)  Est. Patient Level III [78295] 2)  Cerumen Impaction Removal [[id number]]    Current Allergies (reviewed today): PAXIL

## 2010-09-30 ENCOUNTER — Telehealth (INDEPENDENT_AMBULATORY_CARE_PROVIDER_SITE_OTHER): Payer: Self-pay | Admitting: *Deleted

## 2010-10-03 ENCOUNTER — Other Ambulatory Visit (INDEPENDENT_AMBULATORY_CARE_PROVIDER_SITE_OTHER): Payer: 59

## 2010-10-03 ENCOUNTER — Encounter (INDEPENDENT_AMBULATORY_CARE_PROVIDER_SITE_OTHER): Payer: Self-pay | Admitting: *Deleted

## 2010-10-03 ENCOUNTER — Other Ambulatory Visit: Payer: Self-pay | Admitting: Family Medicine

## 2010-10-03 DIAGNOSIS — E781 Pure hyperglyceridemia: Secondary | ICD-10-CM

## 2010-10-03 LAB — HEPATIC FUNCTION PANEL
ALT: 21 U/L (ref 0–53)
Albumin: 4.3 g/dL (ref 3.5–5.2)
Alkaline Phosphatase: 83 U/L (ref 39–117)
Bilirubin, Direct: 0.2 mg/dL (ref 0.0–0.3)
Total Protein: 7 g/dL (ref 6.0–8.3)

## 2010-10-03 LAB — BASIC METABOLIC PANEL
CO2: 29 mEq/L (ref 19–32)
Calcium: 9.5 mg/dL (ref 8.4–10.5)
Chloride: 105 mEq/L (ref 96–112)
Creatinine, Ser: 1.1 mg/dL (ref 0.4–1.5)
Glucose, Bld: 89 mg/dL (ref 70–99)

## 2010-10-03 LAB — LIPID PANEL
HDL: 42.4 mg/dL (ref >39.00)
Triglycerides: 82 mg/dL (ref 0.0–149.0)

## 2010-10-07 ENCOUNTER — Encounter: Payer: Self-pay | Admitting: Family Medicine

## 2010-10-09 ENCOUNTER — Encounter (INDEPENDENT_AMBULATORY_CARE_PROVIDER_SITE_OTHER): Payer: 59 | Admitting: Family Medicine

## 2010-10-09 ENCOUNTER — Encounter: Payer: Self-pay | Admitting: Family Medicine

## 2010-10-09 DIAGNOSIS — I10 Essential (primary) hypertension: Secondary | ICD-10-CM

## 2010-10-09 DIAGNOSIS — E291 Testicular hypofunction: Secondary | ICD-10-CM

## 2010-10-09 DIAGNOSIS — E7800 Pure hypercholesterolemia, unspecified: Secondary | ICD-10-CM

## 2010-10-09 NOTE — Progress Notes (Signed)
----   Converted from flag ---- ---- 09/30/2010 8:29 AM, Kerby Nora MD wrote: Has PSA at urologist CMET, lipids Dx 272.0  ---- 09/30/2010 7:50 AM, Liane Comber CMA (AAMA) wrote: Lab orders please! Good Morning! This pt is scheduled for cpx labs Friday, which labs to draw and dx codes to use? Thanks Tasha ------------------------------

## 2010-10-13 ENCOUNTER — Other Ambulatory Visit (HOSPITAL_COMMUNITY): Payer: Self-pay

## 2010-10-15 ENCOUNTER — Ambulatory Visit (HOSPITAL_COMMUNITY): Admission: RE | Admit: 2010-10-15 | Payer: Self-pay | Source: Home / Self Care | Admitting: Orthopedic Surgery

## 2010-10-15 NOTE — Assessment & Plan Note (Signed)
Summary: CPX/RBH   Vital Signs:  Patient profile:   70 year old male Height:      70 inches Weight:      215.75 pounds BMI:     31.07 Temp:     98.2 degrees F oral Pulse rate:   84 / minute Pulse rhythm:   regular BP sitting:   150 / 84  (left arm) Cuff size:   large  Vitals Entered By: Benny Lennert CMA Duncan Dull) (October 09, 2010 10:21 AM)  History of Present Illness: Chief complaint cpx  HTN: ? control on benicar. At home...BPs have been higher with shoulder pain..  140/85-90   High cholesterol: Stable .Marland Kitchen LDL at goal <130. HDL >40 and triglycerides controlled on niaspan and gemfibrozil.   HAS gained wieight in last year.  BPH s/p TURP.Marland Kitchen sees URO for PSA and rectal exam. Dr Hillis Range.. next OV in May. Now off flomax. Testosterone supplementation as well.. stable on testim.  Left shoulder surgery scheduled Dr Tedra Coupe  in 1 week.. rotator cuff injury/partial tendon tear, acromial bone spur. Using tylenol for BP.  Lipid Management History:      Positive NCEP/ATP III risk factors include male age 104 years old or older and hypertension.  Negative NCEP/ATP III risk factors include non-diabetic, no family history for ischemic heart disease, non-tobacco-user status, no ASHD (atherosclerotic heart disease), no prior stroke/TIA, and no peripheral vascular disease.        His compliance with the TLC diet is fair.     Problems Prior to Update: 1)  Cerumen Impaction, Right  (ICD-380.4) 2)  Viral Uri  (ICD-465.9) 3)  Hemorrhoid, External, Thrombosed  (ICD-455.4) 4)  Hypercholesterolemia  (ICD-272.0) 5)  Testosterone Deficiency  (ICD-257.2) 6)  Screening For Lipoid Disorders  (ICD-V77.91) 7)  Loss, Hearing Nec  (ICD-389.8) 8)  Onychomycosis, Toenails  (ICD-110.1) 9)  B P H  (ICD-600.01) 10)  Screening For Malignant Neoplasm, Prostate  (ICD-V76.44) 11)  Ichthyosis  (ICD-757.1) 12)  Hypertriglyceridemia  (ICD-272.1) 13)  Asthma, Childhood  (ICD-493.00) 14)  Obesity   (ICD-278.00) 15)  Osteoarthritis  (ICD-715.90) 16)  Hypertension  (ICD-401.9) 17)  Gerd  (ICD-530.81)  Current Medications (verified): 1)  Benicar 20 Mg  Tabs (Olmesartan Medoxomil) .... Take 1 Tablet By Mouth Once A Day 2)  Aspirin Childrens 81 Mg Chew (Aspirin) .... Take 1 Tablet By Mouth Once A Day 3)  Gemfibrozil 600 Mg Tabs (Gemfibrozil) .... Take 1 Tablet By Mouth Once A Day 4)  Niaspan 1000 Mg  Tbcr (Niacin (Antihyperlipidemic)) .... Take 1 Tablet By Mouth Twice A Day At Bedtime 5)  Testim 1 %  Gel (Testosterone) .... Once Daily 6)  Proair Hfa 108 (90 Base) Mcg/act  Aers (Albuterol Sulfate) .... 2 Inh Q4h As Needed Shortness of Breath  Allergies: 1)  Paxil  Past History:  Past medical, surgical, family and social histories (including risk factors) reviewed, and no changes noted (except as noted below).  Past Medical History: Reviewed history from 08/26/2010 and no changes required. GERD Hypertension Osteoarthritis h/o asthma  Past Surgical History: Vasectomy 1976 Stress negative 06/2005 ECHO 06/2005 Lithotripsy- left kidney x 2 2003 bone spur removal  2008 Left and right foot 1st digit external hemmorhoid thrombose...irrigated 01/2008  12/2009 TURP. left rotator cuff surgery 09/2010  Family History: Reviewed history from 08/26/2010 and no changes required. Father: Died at age 61, lung cancer, CABG Mother: Alive with breast cancer and kidney cancer Siblings: Twin, who is overweight Grandparent CVA, CHF  Social History: Reviewed  history from 02/16/2007 and no changes required. Marital Status: Married Children: 3 Occupation: Acupuncturist  Review of Systems General:  Denies fatigue and fever. CV:  Denies chest pain or discomfort. Resp:  Denies shortness of breath. GI:  Denies abdominal pain, bloody stools, and constipation. GU:  Denies dysuria and hematuria.  Physical Exam  General:  OVerweight male in NAD  Nose:  External nasal examination shows no  deformity or inflammation. Nasal mucosa are pink and moist without lesions or exudates. Mouth:  MMM Neck:  no carotid bruit or thyromegaly no cervical or supraclavicular lymphadenopathy  Lungs:  Normal respiratory effort, chest expands symmetrically. Lungs are clear to auscultation, no crackles or wheezes. Heart:  Normal rate and regular rhythm. S1 and S2 normal without gallop, murmur, click, rub or other extra sounds. Abdomen:  Bowel sounds positive,abdomen soft and non-tender without masses, organomegaly or hernias noted. Genitalia:  per URO Prostate:  per URO Pulses:  R and L posterior tibial pulses are full and equal bilaterally  Extremities:  no edema Neurologic:  No cranial nerve deficits noted. Station and gait are normal. Plantar reflexes are down-going bilaterally. DTRs are symmetrical throughout. Sensory, motor and coordinative functions appear intact. Skin:  Intact without suspicious lesions or rashes, ichthyosis Psych:  Cognition and judgment appear intact. Alert and cooperative with normal attention span and concentration. No apparent delusions, illusions, hallucinations   Impression & Recommendations:  Problem # 1:  HYPERCHOLESTEROLEMIA (ICD-272.0)  Well controlled. Continue current medication. Encouraged exercise, weight loss, healthy eating habits.  His updated medication list for this problem includes:    Gemfibrozil 600 Mg Tabs (Gemfibrozil) .Marland Kitchen... Take 1 tablet by mouth once a day    Niaspan 1000 Mg Tbcr (Niacin (antihyperlipidemic)) .Marland Kitchen... Take 1 tablet by mouth twice a day at bedtime  Labs Reviewed: SGOT: 21 (10/03/2010)   SGPT: 21 (10/03/2010)  Lipid Goals: Chol Goal: 200 (04/08/2009)   HDL Goal: 40 (04/07/2007)   LDL Goal: 130 (04/07/2007)   TG Goal: 150 (04/07/2007)  10 Yr Risk Heart Disease: 27 % Prior 10 Yr Risk Heart Disease: 22 % (04/08/2009)   HDL:42.40 (10/03/2010), 37.10 (04/01/2010)  LDL:124 (10/03/2010), 127 (04/01/2010)  Chol:183 (10/03/2010), 181  (04/01/2010)  Trig:82.0 (10/03/2010), 83.0 (04/01/2010)  Problem # 2:  TESTOSTERONE DEFICIENCY (ICD-257.2) Stable per URO on testim.   Problem # 3:  HYPERTENSION (ICD-401.9) Borderline control.. pt hesitant abpout increasing med as elevation may be due to pain from shoulder. Continue to follow. If abpve 160/95 prior to surgery start higher dose of benicar. OR.... If after surgery BP remains borderline.Marland Kitchen go ahead and increase ot 40 mg daily of benicar.  Work on exercise and weight loss. His updated medication list for this problem includes:    Benicar 20 Mg Tabs (Olmesartan medoxomil) .Marland Kitchen... Take 1 tablet by mouth once a day  Problem # 4:  Preventive Health Care (ICD-V70.0) The patient's preventative maintenance and recommended screening tests for an annual wellness exam were reviewed in full today. Brought up to date unless services declined.  Counselled on the importance of diet, exercise, and its role in overall health and mortality. The patient's FH and SH was reviewed, including their home life, tobacco status, and drug and alcohol status.     Complete Medication List: 1)  Benicar 20 Mg Tabs (Olmesartan medoxomil) .... Take 1 tablet by mouth once a day 2)  Aspirin Childrens 81 Mg Chew (Aspirin) .... Take 1 tablet by mouth once a day 3)  Gemfibrozil 600 Mg Tabs (Gemfibrozil) .Marland KitchenMarland KitchenMarland Kitchen  Take 1 tablet by mouth once a day 4)  Niaspan 1000 Mg Tbcr (Niacin (antihyperlipidemic)) .... Take 1 tablet by mouth twice a day at bedtime 5)  Testim 1 % Gel (Testosterone) .... Once daily 6)  Proair Hfa 108 (90 Base) Mcg/act Aers (Albuterol sulfate) .... 2 inh q4h as needed shortness of breath  Lipid Assessment/Plan:      Based on NCEP/ATP III, the patient's risk factor category is "2 or more risk factors and a calculated 10 year CAD risk of > 20%".  The patient's lipid goals are as follows: Total cholesterol goal is 200; LDL cholesterol goal is 130; HDL cholesterol goal is 40; Triglyceride goal is 150.  His  LDL cholesterol goal has been met.    Patient Instructions: 1)  Get back on track with exercise and weight loss after surgery. 2)   Fasitng lipids, CMET Dx in 3)  Please schedule a follow-up appointment in 6 months BP check.    Orders Added: 1)  Est. Patient Level IV [60454]    Current Allergies (reviewed today): PAXIL  Last Flu Vaccine:  given (07/04/2009 11:54:13 AM) Flu Vaccine Result Date:  05/24/2010 Flu Vaccine Result:  given Flu Vaccine Next Due:  1 yr

## 2010-11-12 LAB — POCT I-STAT 4, (NA,K, GLUC, HGB,HCT)
Glucose, Bld: 91 mg/dL (ref 70–99)
Potassium: 4.3 mEq/L (ref 3.5–5.1)

## 2010-12-04 ENCOUNTER — Other Ambulatory Visit: Payer: Self-pay | Admitting: *Deleted

## 2010-12-04 MED ORDER — NIACIN ER (ANTIHYPERLIPIDEMIC) 1000 MG PO TBCR: EXTENDED_RELEASE_TABLET | ORAL | Status: DC

## 2010-12-04 MED ORDER — GEMFIBROZIL 600 MG PO TABS: ORAL_TABLET | ORAL | Status: DC

## 2011-01-09 NOTE — Letter (Signed)
September 21, 2006    Max T. Al Corpus, D.P.M.  [address]  [place], [place]. [address]   RE:  Jack, Norris  MRN:  [id number]  /  DOB:  May 09, 1941   Dear Dr. Al Corpus:   Thank you so much for seeing my patient, Jack Norris. After  reviewing his chart and the complete physical exam we did on his initial  visit in my office on September 01, 2006, he appears to be a 70 year old  gentleman with history of BPH, GERD, obesity, arthritis, hypertension,  hypertriglyceridemia, and metabolic syndrome/pre diabetes.  His labs  done on September 07, 2006 show normal electrolytes, normal kidney  function, normal liver function, slightly elevated blood sugar and  cholesterol at goal.  He is stable enough to undergo arthroplasty of the  first metatarsal phalangeal joint under intravenous sedation and local  anesthetic. There are no specific recommendations regarding this patient  prior to his surgery. I have attached a copy of his most recent lab  work. Please let me know if there are any further concerns or  information you require.    Sincerely,      Kerby Nora, MD  Electronically Signed    AB/MedQ  DD: 09/21/2006  DT: 09/21/2006  Job #: [id number]

## 2011-01-09 NOTE — Assessment & Plan Note (Signed)
[facility]                           [facility] OFFICE NOTE   WAH, SABIC                     MRN:          [id number]  DATE:09/01/2006                            DOB:          1941/06/13    CHIEF COMPLAINT:  A 70 year old male here to establish new doctor.   HISTORY OF PRESENT ILLNESS:  Mr. Lemming moved here approximately 1-  1/2 years ago from [place].  He has recently been seeing a doctor  at [facility], but would like to change his care to our practice so  that it is closer to his home.   He has the following concerns:  1. Diabetes screen:  He states that he has been lethargic after eating      sweets over the holidays.  He is concerned that he may have      diabetes.  He does not have a family history of diabetes.  He      denies increased thirst, increased urination, but does notice more      fatigue off and on lately.  2. Right nose bleeds:  This has been going on off and on for several      months to a year.  He does state that he picks his nose and is      trying to stop.  His nose seems to bleed pretty much daily first      thing in the morning. It only lasts about a minute, and he has been      able to get it to resolve with simple pressure.  He does have a      humidifier at home, but is not currently doing any nasal spray.   REVIEW OF SYSTEMS:  Otherwise negative.   PAST MEDICAL HISTORY:  1. BPH.  2. GERD.  3. Obesity.  4. Arthritis.  5. Childhood asthma, mild, intermittent.  6. Hypertension.  7. High triglycerides.  8. Kidney stones.   HOSPITALIZATIONS/SURGERIES/PROCEDURES:  1. In 1976, vasectomy.  2. In November 2006, negative cardiac stress test.  3. Echocardiogram, normal per patient.  4. In 2003, lithotripsy for left kidney stones x2.  He states that the      kidney stone is still in place, but does not cause him pain.  5. Colonoscopy, March 2007, negative.   ALLERGIES:  NONE.   MEDICATIONS:  1. Benicar 20/12.5 mg 1 tablet daily.  2. Gemfibrozil 600 mg 1 tablet p.o. b.i.d.  3. Flomax 0.4 mg 1 tablet p.o. daily.  4. Aspirin 81 mg 1 tablet daily.   SOCIAL HISTORY:  He has a 10-pack history of smoking, but quit in 1976.  He occasionally has 1 at holidays.  He has no history of drug use.  He  works as an Community education officer full time.  He has been married for 38  years.  He has 3 children who are healthy.  He does not get any regular  exercise.  He does eat a high fiber diet, but does not get a lot of  fruits and vegetables.  He is lactose intolerant.  He drinks occasional  water.   FAMILY HISTORY:  Father deceased at age 8 with lung cancer.  At age 64,  he began having problems with coronary artery disease which resulted  later in life with a coronary artery bypass graft.  His mother is alive  at age 22 with breast cancer and kidney cancer.  He has several  grandparents with history of stroke and what sounds like congestive  heart failure.  He has a twin who is overweight but is otherwise  healthy.  There is no family history of heart attack before age 60.  There is no family history of other type of cancer.   PHYSICAL EXAMINATION:  VITAL SIGNS:  Height 70 inches, weight 237  pounds, making BMI 33.  Blood pressure 147/74.  Pulse 92.  Temperature  98.1.  GENERAL:  Obese-appearing male in no apparent distress.  HEENT:  PERRLA.  Extraocular muscles intact.  Oropharynx clear.  Tympanic membranes clear.  No thyromegaly, no lymphadenopathy,  supraclavicular or cervical.  Right naris with several small clots.  No  active bleeding.  No blood vessels noted close to the surface.  ABDOMEN:  Central obesity.  Soft, nontender, normoactive bowel sounds.  No hepatosplenomegaly.  CARDIOVASCULAR:  Regular rate and rhythm, no murmurs, rubs, or gallops.  Normal PMI.  2+peripheral pulses.  No peripheral edema.  LUNGS:  Clear to auscultation bilaterally.  No wheezing, rales or   rhonchi.  MUSCULOSKELETAL:  Strength 5/5 in upper and lower extremities.  NEURO:  Cranial nerves II-XII grossly intact.  Alert and oriented x3.   ASSESSMENT/PLAN:  1. Concern for diabetes:  He does not have a lot of the symptoms of      diabetes, but given his concern and his central obesity, we will      check a fasting blood sugar.  2. Right nose bleeds:  He was instructed to completely eliminate nose      picking.  He will use nasal saline 3-4 times daily in bilateral      nostrils.  If the nose bleeding continues at this frequency, he was      instructed to give [place] a call and we can consider referring him to      an ear, nose, and throat doctor.  If his nose bleeds for more than      15 minutes at a time, he is instructed to go to the hospital for an      emergency, given the amount of blood loss that can happen.  3. Prevention.  It sounds as though he is due for a cholesterol panel.      I will obtain records      from his previous doctor.  He is otherwise up-to-date with colon      cancer screening and prostate cancer screening.  He is unsure when      tetanus was last checked.     Kerby Nora, MD  Electronically Signed    AB/MedQ  DD: 09/01/2006  DT: 09/01/2006  Job #: [id number]

## 2011-01-29 ENCOUNTER — Telehealth: Payer: Self-pay | Admitting: *Deleted

## 2011-01-29 NOTE — Telephone Encounter (Signed)
Patient has an appt on 04-16-11 for blood pressure follow up and he is asking if you want him to have labs prior. Please advise.

## 2011-01-30 NOTE — Telephone Encounter (Signed)
No labs needed...just had in 09/2010 prior to CPX.

## 2011-02-02 NOTE — Telephone Encounter (Signed)
Recommendations in the last year have suggest no need to check LFT more thatn yearly if on low dose cholesterol meds. He is not even on a statin med.  If he would still like to follow, we can. Let me know.

## 2011-02-02 NOTE — Telephone Encounter (Signed)
Patient says that you have checked a liver panel on him every 6 months for the past 6 years b/c he is taken cholesterol lowering medication

## 2011-02-03 NOTE — Telephone Encounter (Signed)
Patient advised via message on machine and also advised patient to call back if he wants labs done

## 2011-03-25 ENCOUNTER — Encounter: Payer: Self-pay | Admitting: Family Medicine

## 2011-03-26 ENCOUNTER — Ambulatory Visit (INDEPENDENT_AMBULATORY_CARE_PROVIDER_SITE_OTHER): Payer: 59 | Admitting: Family Medicine

## 2011-03-26 ENCOUNTER — Encounter: Payer: Self-pay | Admitting: Family Medicine

## 2011-03-26 VITALS — BP 140/90 | HR 72 | Temp 98.9°F | Ht 69.5 in | Wt 216.8 lb

## 2011-03-26 DIAGNOSIS — R109 Unspecified abdominal pain: Secondary | ICD-10-CM

## 2011-03-26 DIAGNOSIS — R3129 Other microscopic hematuria: Secondary | ICD-10-CM

## 2011-03-26 DIAGNOSIS — N200 Calculus of kidney: Secondary | ICD-10-CM

## 2011-03-26 LAB — POCT URINALYSIS DIPSTICK
Bilirubin, UA: NEGATIVE
Glucose, UA: NEGATIVE
Ketones, UA: NEGATIVE
Leukocytes, UA: NEGATIVE
Nitrite, UA: NEGATIVE
pH, UA: 7

## 2011-03-26 MED ORDER — TAMSULOSIN HCL 0.4 MG PO CAPS: 0.4000 mg | ORAL_CAPSULE | Freq: Every day | ORAL | Status: DC

## 2011-03-26 NOTE — Progress Notes (Signed)
  Subjective:    Patient ID: Jack Norris, male    DOB: 12/21/1940, 70 y.o.   MRN: [id number]  HPI  Pleasant patient presents with non-acute abdominal pain in the lower quadrants that has been ongoing for about the last 2 weeks. He denies any nausea or vomiting. No back pain. No chest pain. No shortness of breath. He is on any gross hematuria. There is some microscopic hematuria on urinalysis. There is overall distress sensation. Bowel movements have been perfectly normal. No diarrhea. He does have a significant history of nephrolithiasis twice in the past requiring lithotripsy.  Lower gi distress, some gas.  Not unlike when he had a kidney stone in the past. Did lithotripsy. Sees Dr. Lynnae Sandhoff. Had a TURP about a year ago. Normal bowel movements.   Has some overactive bladder. Fairly normal urination.  No STD risk.  The PMH, PSH, Social History, Family History, Medications, and allergies have been reviewed in [facility], and have been updated if relevant.  Review of Systems ROS: GEN: Acute illness details above GI: Tolerating PO intake GU: maintaining adequate hydration and urination Pulm: No SOB Interactive and getting along well at home.  Otherwise, ROS is as per the HPI.     Objective:   Physical Exam   Physical Exam  Blood pressure 140/90, pulse 72, temperature 98.9 F (37.2 C), temperature source Oral, height 5' 9.5" (1.765 m), weight 216 lb 12.8 oz (98.34 kg), SpO2 98.00%.  GEN: WDWN, NAD, Non-toxic, A & O x 3 HEENT: Atraumatic, Normocephalic. Neck supple. No masses, No LAD. Ears and Nose: No external deformity. CV: RRR, No M/G/R. No JVD. No thrill. No extra heart sounds. PULM: CTA B, no wheezes, crackles, rhonchi. No retractions. No resp. distress. No accessory muscle use. ABD: S, NT, ND, +BS. No rebound tenderness. No HSM.  EXTR: No c/c/e NEURO Normal gait.  PSYCH: Normally interactive. Conversant. Not depressed or anxious appearing.  Calm demeanor.          Assessment & Plan:   1. Nephrolithiasis    2. Abdominal pain  POCT Urinalysis Dipstick  3. Microscopic hematuria      Clinically most consistent with nephrolithiasis given microscopic hematuria and benign examination findings. For now, I given him some Flomax, asked him to push fluids. If on Monday he still has significant symptoms, I think that at the very least we need to do a renal protocol CT versus him following up with his urologist.

## 2011-03-27 DIAGNOSIS — N200 Calculus of kidney: Secondary | ICD-10-CM | POA: Insufficient documentation

## 2011-04-14 ENCOUNTER — Ambulatory Visit: Payer: 59 | Admitting: Family Medicine

## 2011-04-16 ENCOUNTER — Ambulatory Visit: Payer: 59 | Admitting: Family Medicine

## 2011-04-17 ENCOUNTER — Ambulatory Visit (INDEPENDENT_AMBULATORY_CARE_PROVIDER_SITE_OTHER): Payer: 59 | Admitting: Family Medicine

## 2011-04-17 ENCOUNTER — Encounter: Payer: Self-pay | Admitting: Family Medicine

## 2011-04-17 DIAGNOSIS — R109 Unspecified abdominal pain: Secondary | ICD-10-CM

## 2011-04-17 DIAGNOSIS — I10 Essential (primary) hypertension: Secondary | ICD-10-CM

## 2011-04-17 DIAGNOSIS — R1030 Lower abdominal pain, unspecified: Secondary | ICD-10-CM | POA: Insufficient documentation

## 2011-04-17 MED ORDER — OLMESARTAN MEDOXOMIL-HCTZ 20-12.5 MG PO TABS: 1.0000 | ORAL_TABLET | Freq: Every day | ORAL | Status: DC

## 2011-04-17 NOTE — Progress Notes (Signed)
  Subjective:    Patient ID: Jack Norris, male    DOB: Sep 06, 1940, 70 y.o.   MRN: [id number]  HPI  HTN: Improved control on  Stable dose of benicar.  Using medication without problems or lightheadedness:  Chest pain with exertion: None Edema:None Short of breath:None Average home BPs: Has not been checking regularly... At last 2 OV >140/90 Other issues:  walking 2-3 miles a day.   He has had shoulder surgery with Dr. Ave Filter.  Went well, no complications.  S/P PT rehab.  See earlier on 8/2 for presumed kidney stone. Followed up with Dr. Hillis Range...he felt possibly due to diverticulitis...placed on cipro and flagyl. Brownish urine while on medication, now resolved. Pain is improved but still remains some in left lower abdomen and right lower abdomen.. Very mild now, almost like heaviness.  No blood in stool, no diarrhea, no constipation on metamucil daily.  BPH s/p TURP.Marland Kitchen sees URO for PSA and rectal exam. Dr Hillis Range.. next OV in May.  Now off flomax.  Testosterone supplementation as well.. stable on testim.       Review of Systems  Constitutional: Negative for fever and unexpected weight change.  HENT: Negative for ear pain.   Eyes: Negative for pain.  Respiratory: Negative for chest tightness, shortness of breath and wheezing.   Cardiovascular: Negative for chest pain, palpitations and leg swelling.  Gastrointestinal: Negative for nausea, diarrhea, constipation, blood in stool and abdominal distention.  Genitourinary: Negative for dysuria, urgency, hematuria, penile swelling and penile pain.       Objective:   Physical Exam  Constitutional: Vital signs are normal. He appears well-developed and well-nourished.  HENT:  Head: Normocephalic.  Right Ear: Hearing normal.  Left Ear: Hearing normal.  Nose: Nose normal.  Mouth/Throat: Oropharynx is clear and moist and mucous membranes are normal.  Neck: Trachea normal. Carotid bruit is not present. No mass and no  thyromegaly present.  Cardiovascular: Normal rate, regular rhythm and normal pulses.  Exam reveals no gallop, no distant heart sounds and no friction rub.   No murmur heard.      No peripheral edema  Pulmonary/Chest: Effort normal and breath sounds normal. No respiratory distress.  Abdominal: Soft. Normal appearance and normal aorta. There is no hepatosplenomegaly. There is tenderness in the suprapubic area and left upper quadrant. There is no rigidity, no rebound, no guarding, no CVA tenderness, no tenderness at McBurney's point and negative Murphy's sign. No hernia. Hernia confirmed negative in the ventral area.  Skin: Skin is warm, dry and intact. No rash noted.  Psychiatric: He has a normal mood and affect. His speech is normal and behavior is normal. Thought content normal.          Assessment & Plan:

## 2011-04-17 NOTE — Assessment & Plan Note (Signed)
Add back HCTZ to benicar for improved control. Follow Bps at home. Call if remaining elevated.

## 2011-04-17 NOTE — Patient Instructions (Addendum)
Work on exercise and weight loss. We will add HCTZ back to Benicar. Follow BPs at home in next 2 weeks. Call if not  <140/90. Keep upcoming appt with Dr. Hillis Range. Call if urologic issues not likely.. We can go forward with abdominal CT to evaluate further.

## 2011-04-17 NOTE — Assessment & Plan Note (Signed)
Not celarly diverticultis or stones.  Will have Dr. Lilla Shook urine and consider possibility of stone causing pain. Symptms improived but did not resolve with antibiotics. Pain right now very mild. If not improving consider abd CT to eval further.. No other ongoing issues such as firm stools etc.

## 2011-06-12 ENCOUNTER — Ambulatory Visit (INDEPENDENT_AMBULATORY_CARE_PROVIDER_SITE_OTHER): Payer: 59 | Admitting: Family Medicine

## 2011-06-12 ENCOUNTER — Encounter: Payer: Self-pay | Admitting: Family Medicine

## 2011-06-12 DIAGNOSIS — M79609 Pain in unspecified limb: Secondary | ICD-10-CM

## 2011-06-12 DIAGNOSIS — M79606 Pain in leg, unspecified: Secondary | ICD-10-CM

## 2011-06-12 NOTE — Patient Instructions (Signed)
Keep your leg up/elevated.  You can alternate ice/heat, about 10 minutes each.  I would walk as pain allows. If you have a dramatic increase in pain or if you can't move your foot without severe pain, then notify us.  Take care.  Keep your finger clean and bandaged.

## 2011-06-12 NOTE — Progress Notes (Signed)
R anterior shin pain and swelling.  Started a few days ago with the cramp in the R calf.  Since then he has more anterior pain. No posterior pain now. Had been working hard in the hard.  Pain is 5/10, worse walking.  No trauma.    Also with R 2nd finger with apparent splinter on dorsum near DIP.  Had been working on a rose bush recently.    Meds, vitals, and allergies reviewed.   ROS: See HPI.  Otherwise, noncontributory.  nad ncat R shin with discolored area anteriorly and medially, tender and faintly darker, not red.  No fluctuant mass.  Able to bear weight.  No pain with rom of the foot.  Calf not ttp.   R 2nd finger with splinter on dorsum near DIP, easily removed with tweezers after area cleaned with alcohol.  Covered with bandaid and neosporin.

## 2011-06-14 ENCOUNTER — Encounter: Payer: Self-pay | Admitting: Family Medicine

## 2011-06-14 DIAGNOSIS — M79606 Pain in leg, unspecified: Secondary | ICD-10-CM | POA: Insufficient documentation

## 2011-06-14 NOTE — Assessment & Plan Note (Signed)
Likely hematoma, no sign of infection or compartment syndrome.  Ice, heat, rest and activity as tolerated.  Also local treatment for hand and fu prn. He agrees.

## 2011-06-24 ENCOUNTER — Telehealth: Payer: Self-pay

## 2011-06-24 NOTE — Telephone Encounter (Signed)
Pt saw Dr Para March on 06/12/11 with bump on front of shin and cramping in calf of rt leg. Pt not sure if had injury or what caused hematoma but is not better. Pt said leg hurts worse when walking especially stairs and hematoma is still there. Pt states he is limping from the pain and pain level now is 3-4. Pt understands Dr Ermalene Searing will be in office 06/25/11 and wants to wait to see what she suggest. Pt can be reached at [phone] or [phone].Please advise.

## 2011-06-25 NOTE — Telephone Encounter (Signed)
i will forward this to Dr. Para March since he saw Pt, but if he does not have any further recommendations...otherwise I would have him make a follow up for re-eval.

## 2011-06-25 NOTE — Telephone Encounter (Signed)
Patient schedule appt with Aram Beecham

## 2011-06-25 NOTE — Telephone Encounter (Signed)
These usually self resolve. If he's continuing to have significant pain (or if the pain is increasing), then we should recheck him.

## 2011-06-25 NOTE — Telephone Encounter (Signed)
Will await dr Armanda Heritage response

## 2011-06-26 ENCOUNTER — Ambulatory Visit (INDEPENDENT_AMBULATORY_CARE_PROVIDER_SITE_OTHER): Payer: 59 | Admitting: Family Medicine

## 2011-06-26 ENCOUNTER — Encounter: Payer: Self-pay | Admitting: Family Medicine

## 2011-06-26 VITALS — BP 140/80 | HR 94 | Temp 98.5°F | Ht 69.0 in | Wt 217.4 lb

## 2011-06-26 DIAGNOSIS — M79609 Pain in unspecified limb: Secondary | ICD-10-CM

## 2011-06-26 DIAGNOSIS — M79606 Pain in leg, unspecified: Secondary | ICD-10-CM

## 2011-06-26 DIAGNOSIS — Z23 Encounter for immunization: Secondary | ICD-10-CM

## 2011-06-26 NOTE — Assessment & Plan Note (Signed)
Hematoma.. No change in size. Pain appears most due to swelling at ankle from fluid/ blood moving with gravity. No red flags for nerve or vessel compression.  Recommend focused elevation of leg which pt has not been doing much.  Tylenol for pain.  Call back on Monday for update.  Iwill curbside Dr. Patsy Lager to see if he has any further recs if he is available... Or we can consider referral to [facility]  If not improving.

## 2011-06-26 NOTE — Progress Notes (Signed)
  Subjective:    Patient ID: Jack Norris, male    DOB: 1941-07-09, 70 y.o.   MRN: [id number]  HPI  70 year old male presents with continued  Right leg pain.  Seen on 10/19 by Dr. Duncan:dx with deep muscle hematoma. R anterior shin pain and swelling. Started a few days prior to presentation  on 10/6  with the cramp in the R calf after playing volleyball.. Hematoma developed and some more ad been working hard in the yard. No blunt trauma.  Pain is 5/10, worse walking.   Pt reports  since then continued  swelling/ hematoma in right anterior leg. No change in size of hematoma.  Now more right calf pain. Swelling  in medial ankle,  has had change in color of leg.. slightly purplish and  can see contusion/poooled blood in right medial ankle. Pain on pain scale 5-6/10  In last few day has had to start limping given ankle pain.   No limit of range of motion in knee, ankle, foot. Improved at rest.  He has been applying heat, not using any pain med. Occ elevating leg but not consistently.      Review of Systems  Constitutional: Negative for fever and fatigue.  HENT: Negative for ear pain.   Eyes: Negative for pain.  Respiratory: Negative for shortness of breath and wheezing.   Cardiovascular: Negative for chest pain.       Objective:   Physical Exam  Constitutional: He is oriented to person, place, and time. He appears well-developed and well-nourished.  Cardiovascular: Normal rate, regular rhythm and intact distal pulses.   Pulmonary/Chest: Effort normal and breath sounds normal.  Musculoskeletal:       Right knee: Normal.       Right ankle: He exhibits swelling and ecchymosis. He exhibits normal range of motion. tenderness. No lateral malleolus and no medial malleolus tenderness found.       6 cm diameter hematoma right anterior shin mildly tender to palpation, pain to palpation in right calf.  Discoloration/bruising in right calf ,most swelling and discoloration focused at  right medial ankle.  Neurological: He is alert and oriented to person, place, and time.          Assessment & Plan:

## 2011-06-26 NOTE — Patient Instructions (Addendum)
Focus on elevating foot above heart as much as possible over weekend.  Hold aspirin. Use tylenol for pain. Ice massage on calf. Call on Monday for update and possible referral to Ortho or discuss with Dr. Patsy Lager for further recommendations. Call sooner if increase in pain, numbness in foot etc.

## 2011-08-05 ENCOUNTER — Telehealth: Payer: Self-pay | Admitting: Internal Medicine

## 2011-08-05 NOTE — Telephone Encounter (Signed)
Patient called and stated he is switching pharmacies and needs his Rx printed out to mail into the new pharmacy.  Is this ok.  Please advise.

## 2011-08-05 NOTE — Telephone Encounter (Signed)
Yes  Check and see if this was done

## 2011-08-06 ENCOUNTER — Other Ambulatory Visit: Payer: Self-pay

## 2011-08-06 MED ORDER — OLMESARTAN MEDOXOMIL-HCTZ 20-12.5 MG PO TABS: 1.0000 | ORAL_TABLET | Freq: Every day | ORAL | Status: DC

## 2011-08-06 MED ORDER — LISINOPRIL-HYDROCHLOROTHIAZIDE 20-12.5 MG PO TABS: 1.0000 | ORAL_TABLET | Freq: Every day | ORAL | Status: DC

## 2011-08-06 MED ORDER — ALBUTEROL SULFATE HFA 108 (90 BASE) MCG/ACT IN AERS: 2.0000 | INHALATION_SPRAY | RESPIRATORY_TRACT | Status: DC | PRN

## 2011-08-06 MED ORDER — NIACIN ER (ANTIHYPERLIPIDEMIC) 1000 MG PO TBCR: EXTENDED_RELEASE_TABLET | ORAL | Status: DC

## 2011-08-06 MED ORDER — GEMFIBROZIL 600 MG PO TABS: ORAL_TABLET | ORAL | Status: DC

## 2011-08-06 NOTE — Telephone Encounter (Signed)
Pt picked up prescriptions today and his insurance will not approve Benicar HCT. Pt request a generic blood pressure medicine prescription so his insurance will approve it. Pt can be reached at [phone] or cell [phone].

## 2011-08-06 NOTE — Telephone Encounter (Signed)
rx put up front for pick up

## 2011-08-06 NOTE — Telephone Encounter (Signed)
Change to lisinopril HCTZ.  Follow Bp closely so we can see if it remains well controlled on med change.

## 2011-08-07 NOTE — Telephone Encounter (Signed)
Already done by [facility]

## 2011-08-10 ENCOUNTER — Other Ambulatory Visit: Payer: Self-pay | Admitting: *Deleted

## 2011-08-10 MED ORDER — LISINOPRIL-HYDROCHLOROTHIAZIDE 20-12.5 MG PO TABS: 1.0000 | ORAL_TABLET | Freq: Every day | ORAL | Status: DC

## 2011-09-07 ENCOUNTER — Telehealth: Payer: Self-pay | Admitting: Family Medicine

## 2011-09-07 ENCOUNTER — Encounter: Payer: Self-pay | Admitting: Family Medicine

## 2011-09-07 ENCOUNTER — Ambulatory Visit (INDEPENDENT_AMBULATORY_CARE_PROVIDER_SITE_OTHER): Payer: Self-pay | Admitting: Family Medicine

## 2011-09-07 VITALS — BP 144/78 | HR 78 | Temp 98.4°F | Wt 218.0 lb

## 2011-09-07 DIAGNOSIS — J029 Acute pharyngitis, unspecified: Secondary | ICD-10-CM

## 2011-09-07 DIAGNOSIS — Z20818 Contact with and (suspected) exposure to other bacterial communicable diseases: Secondary | ICD-10-CM

## 2011-09-07 DIAGNOSIS — Z2089 Contact with and (suspected) exposure to other communicable diseases: Secondary | ICD-10-CM

## 2011-09-07 NOTE — Assessment & Plan Note (Signed)
Anticipate due to PNDrainage. + strep exposures but not typical sxs, no centor criteria. RST negative. Supportive care, use albuterol PRN, use tylenol, antihistamin, nasal saline. Pt declines INS for now.

## 2011-09-07 NOTE — Telephone Encounter (Signed)
Patient was seen today by dr g

## 2011-09-07 NOTE — Progress Notes (Signed)
  Subjective:    Patient ID: Jack Norris, male    DOB: 21-Aug-1941, 71 y.o.   MRN: [id number]  HPI CC: ST  1 wk h/o sinus drainage.  Recent trip to [place], sick contacts.  Recent strep exposure - son, grandson, other friends.  Mild cough.  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  Yesterday a bit of chest tightness.  Has tried acetaminophen which helps.  Denies fevers/chills, abd pain, n/v, ear pain or tooth pain.  No h/o allergy issues. No h/o COPD.  + childhood asthma, uses albuterol PRN.  Review of Systems Per HPI    Objective:   Physical Exam  Nursing note and vitals reviewed. Constitutional: He appears well-developed and well-nourished. No distress.  HENT:  Head: Normocephalic and atraumatic.  Right Ear: Hearing, tympanic membrane, external ear and ear canal normal.  Left Ear: Hearing, tympanic membrane, external ear and ear canal normal.  Nose: Nose normal. No mucosal edema or rhinorrhea. Right sinus exhibits no maxillary sinus tenderness and no frontal sinus tenderness. Left sinus exhibits no maxillary sinus tenderness and no frontal sinus tenderness.  Mouth/Throat: Uvula is midline and mucous membranes are normal. Posterior oropharyngeal erythema present. No oropharyngeal exudate, posterior oropharyngeal edema or tonsillar abscesses.       Cerumen R ear  Eyes: Conjunctivae and EOM are normal. Pupils are equal, round, and reactive to light. No scleral icterus.  Neck: Normal range of motion. Neck supple. No thyromegaly present.  Cardiovascular: Normal rate, regular rhythm, normal heart sounds and intact distal pulses.   No murmur heard. Pulmonary/Chest: Effort normal and breath sounds normal. No respiratory distress. He has no wheezes. He has no rales.  Musculoskeletal: He exhibits no edema.  Lymphadenopathy:    He has no cervical adenopathy.  Skin: Skin is warm and dry. No rash noted.  Psychiatric: He has a normal mood and affect.      Assessment & Plan:

## 2011-09-07 NOTE — Telephone Encounter (Signed)
Make sure pt has appt scheduled to be seen or has been seen at urgent care.

## 2011-09-07 NOTE — Patient Instructions (Signed)
I think the drainage is causing the sore throat. Could be viral upper respiratory infection (cold). If fever >101, worsening cough, or other concerns, please let us know. Good to see you today, call us with questions. May do albuterol regularly for next few days, continue nasal saline, may use mucinex if congestion worsens, or antihistamine.

## 2011-09-07 NOTE — Telephone Encounter (Signed)
Triage Record Num: [id number] Operator: Ether Griffins Patient Name: Jack Norris Call Date & Time: 09/06/2011 1:59:50PM Patient Phone: [phone] PCP: Kerby Nora Patient Gender: Male PCP Fax : [phone] Patient DOB: Apr 03, 1941 Practice Name: [facility] Reason for Call: Caller: Dionis/Patient; PCP: Excell Seltzer.; CB#: [phone]; Calling about sore throat--white nodules & patches, sinus drainage and pressure--onset 1 week ago, tight chest.Onset 09/05/11.Was exposed to Strep throat.All emergent sxs of Sore throat or hoarseness r/o.Advised to call office in am to schedule an appt. Protocol(s) Used: Sore Throat or Hoarseness Recommended Outcome per Protocol: See Provider within 24 hours Reason for Outcome: Creamy-white sore patches in the mouth or throat AND patches brush off or come off when eating, leaving a new, bleeding surface Care Advice: ~ Call provider if have pain or difficulty swallowing. Call provider immediately if you are immunocompromised, including HIV positive, taking chemotherapy or medication that suppresses the immune system. ~ ~ SYMPTOM / CONDITION MANAGEMENT ~ CAUTIONS Increase intake of fluids. Drinking through a straw may reduce discomfort. Eat foods that are easy to swallow. Avoid drinking carbonated or alcoholic drinks. ~ Sore Throat Relief: - Use warm salt water gargles 3 to 4 times/day, as needed (1/2 tsp. salt in 8 oz. [.2 liters] water). - Suck on hard candy, nonprescription or herbal throat lozenges (sugar-free if diabetic) - Eat soothing, soft food/fluids (broths, soups, or honey and lemon juice in hot tea, Popsicles, frozen yogurt or sherbet, scrambled eggs, cooked cereals, Jell-O or puddings) whichever is most comforting. - Avoid eating salty, spicy or acidic foods. ~ 01/13/

## 2011-09-17 ENCOUNTER — Other Ambulatory Visit: Payer: Self-pay | Admitting: *Deleted

## 2011-09-17 MED ORDER — LISINOPRIL-HYDROCHLOROTHIAZIDE 20-12.5 MG PO TABS: 1.0000 | ORAL_TABLET | Freq: Every day | ORAL | Status: DC

## 2011-12-14 ENCOUNTER — Other Ambulatory Visit: Payer: 59

## 2011-12-14 ENCOUNTER — Ambulatory Visit (INDEPENDENT_AMBULATORY_CARE_PROVIDER_SITE_OTHER): Payer: Medicare Other | Admitting: Family Medicine

## 2011-12-14 ENCOUNTER — Encounter: Payer: Self-pay | Admitting: Family Medicine

## 2011-12-14 VITALS — BP 130/80 | HR 98 | Temp 98.2°F | Ht 69.0 in | Wt 216.1 lb

## 2011-12-14 DIAGNOSIS — I10 Essential (primary) hypertension: Secondary | ICD-10-CM

## 2011-12-14 DIAGNOSIS — E7800 Pure hypercholesterolemia, unspecified: Secondary | ICD-10-CM

## 2011-12-14 DIAGNOSIS — R109 Unspecified abdominal pain: Secondary | ICD-10-CM

## 2011-12-14 DIAGNOSIS — Z125 Encounter for screening for malignant neoplasm of prostate: Secondary | ICD-10-CM

## 2011-12-14 DIAGNOSIS — R1030 Lower abdominal pain, unspecified: Secondary | ICD-10-CM

## 2011-12-14 DIAGNOSIS — Z79899 Other long term (current) drug therapy: Secondary | ICD-10-CM

## 2011-12-14 LAB — CBC WITH DIFFERENTIAL/PLATELET
Basophils Absolute: 0 10*3/uL (ref 0.0–0.1)
Eosinophils Relative: 3.4 % (ref 0.0–5.0)
HCT: 43.2 % (ref 39.0–52.0)
Lymphocytes Relative: 24.1 % (ref 12.0–46.0)
Monocytes Relative: 6 % (ref 3.0–12.0)
Neutrophils Relative %: 65.9 % (ref 43.0–77.0)
Platelets: 256 10*3/uL (ref 150.0–400.0)
RDW: 14.3 % (ref 11.5–14.6)
WBC: 5.5 10*3/uL (ref 4.5–10.5)

## 2011-12-14 LAB — BASIC METABOLIC PANEL
BUN: 22 mg/dL (ref 6–23)
Calcium: 9.6 mg/dL (ref 8.4–10.5)
Creatinine, Ser: 1.1 mg/dL (ref 0.4–1.5)
GFR: 68.04 mL/min (ref >60.00)
Potassium: 4.4 mEq/L (ref 3.5–5.1)

## 2011-12-14 LAB — HEPATIC FUNCTION PANEL
ALT: 18 U/L (ref 0–53)
AST: 18 U/L (ref 0–37)
Alkaline Phosphatase: 86 U/L (ref 39–117)
Bilirubin, Direct: 0.1 mg/dL (ref 0.0–0.3)
Total Bilirubin: 0.6 mg/dL (ref 0.3–1.2)

## 2011-12-14 LAB — POCT URINALYSIS DIPSTICK
Glucose, UA: NEGATIVE
Ketones, UA: NEGATIVE
Leukocytes, UA: NEGATIVE
Protein, UA: NEGATIVE
Spec Grav, UA: 1.02

## 2011-12-14 MED ORDER — METRONIDAZOLE 500 MG PO TABS: 500.0000 mg | ORAL_TABLET | Freq: Three times a day (TID) | ORAL | Status: AC

## 2011-12-14 MED ORDER — CIPROFLOXACIN HCL 750 MG PO TABS: 750.0000 mg | ORAL_TABLET | Freq: Two times a day (BID) | ORAL | Status: AC

## 2011-12-14 NOTE — Progress Notes (Signed)
Patient Name: Jack Norris Date of Birth: 1940-09-27 Age: 71 y.o. Medical Record Number: [id number] Gender: male Date of Encounter: 12/14/2011  History of Present Illness:  Jack Norris is a 71 y.o. very pleasant male patient who presents with the following:  9 mo h/o abdominal pain:  The patient has an abdominal pain approximately 9 months ago, he was empirically treated with diverticulitis, and he also was found to have a nonobstructing kidney stone. Ultimately the patient's pain resolved. Approximately 2 weeks ago, the patient was eating some corn per his memory, and afterwards he is had some continued lower abdominal discomfort. He has also had some intermittent diarrhea. No fever. No blood in the stool. No mucus in the stool.  Has been lactose intolerant for many years, and has been for ten years or so. A couple of weeks ago, ate some corn, and had some discomfort and then had some diarrhea. Never has really gotten over the discomfort from a couple of weeks ago. Will feel some discomfort with cutting grass and cutting ggas. Lower abd.   Has an appointment with Urologist coming up in the next week or so.   Gallstones were found on CT in the fall.  Patient Active Problem List  Diagnoses  . TESTOSTERONE DEFICIENCY  . HYPERCHOLESTEROLEMIA  . HYPERTRIGLYCERIDEMIA  . OBESITY  . LOSS, HEARING NEC  . HYPERTENSION  . ASTHMA, CHILDHOOD  . GERD  . B P H  . OSTEOARTHRITIS  . ICHTHYOSIS  . Nephrolithiasis  . Lower abdominal pain   Past Medical History  Diagnosis Date  . Arthritis   . GERD (gastroesophageal reflux disease)   . Hypertension   . Asthma    Past Surgical History  Procedure Date  . Vasectomy   . Lithotripsy     left kidney x 2  . Bone spur removal   . Rotator cuff repair   . Transurethral resection of prostate    History  Substance Use Topics  . Smoking status: Never Smoker   . Smokeless tobacco: Not on file  . Alcohol Use: Not on file   Family  History  Problem Relation Age of Onset  . Cancer Mother     breast and kidney   . Cancer Father     lung  . Heart disease Father    Allergies  Allergen Reactions  . Paroxetine     REACTION: Had mild personality change on this medication and doesn't want to take this again.   Current Outpatient Prescriptions on File Prior to Visit  Medication Sig Dispense Refill  . aspirin 81 MG tablet Take 81 mg by mouth daily.        Marland Kitchen gemfibrozil (LOPID) 600 MG tablet Take one tablet daily  90 tablet  3  . lisinopril-hydrochlorothiazide (PRINZIDE,ZESTORETIC) 20-12.5 MG per tablet Take 1 tablet by mouth daily.  90 tablet  3  . niacin (NIASPAN) 1000 MG CR tablet Take one tablet twice a day      . Testosterone (ANDROGEL PUMP TD) Place onto the skin.           Past Medical History, Surgical History, Social History, Family History, Problem List, Medications, and Allergies have been reviewed and updated if relevant.  Review of Systems: As above. Able to eat and drink. No fever. No upper abdominal pain.  Physical Examination: Filed Vitals:   12/14/11 0852  BP: 130/80  Pulse: 98  Temp: 98.2 F (36.8 C)  TempSrc: Oral  Height: 5\' 9"  (1.753 m)  Weight: 216  lb 1.9 oz (98.031 kg)  SpO2: 98%    Body mass index is 31.92 kg/(m^2).   GEN: WDWN, NAD, Non-toxic, A & O x 3 HEENT: Atraumatic, Normocephalic. Neck supple. No masses, No LAD. Ears and Nose: No external deformity. CV: RRR, No M/G/R. No JVD. No thrill. No extra heart sounds. PULM: CTA B, no wheezes, crackles, rhonchi. No retractions. No resp. distress. No accessory muscle use. ABD: soft. Lower RIGHT and LEFT abdomen  With mild tenderness. No rebound. No RIGHT upper quadrant tenderness. No epigastric tenderness. No distention. Positive bowel sounds EXTR: No c/c/e NEURO Normal gait.  PSYCH: Normally interactive. Conversant. Not depressed or anxious appearing.  Calm demeanor.    Assessment and Plan:  1. Abdominal pain, lower  Hepatic  function panel, Lipase, POCT Urinalysis Dipstick  2. Prostate cancer screening  PSA  3. HYPERCHOLESTEROLEMIA  LDL cholesterol, direct  4. HYPERTENSION    5. Encounter for long-term (current) use of other medications  Basic metabolic panel, CBC with Differential, Hepatic function panel  6. Lower abdominal pain     Lower abdominal pain. Historically, concerning for potential diverticulitis. We will treat him empirically.  The patient also has not had any laboratories, and he is coming in for a physical with his primary care provider in the upcoming weeks.  He also has an upcoming urological appointment. If he continues to have lower abdominal pain without a source, he is going to follow up and call in the next 7-10 days, and I think it is reasonable to have gastroenterology evaluate him  Orders Today: Orders Placed This Encounter  Procedures  . Basic metabolic panel  . CBC with Differential  . Hepatic function panel  . PSA  . Lipase  . LDL cholesterol, direct  . POCT Urinalysis Dipstick    Medications Today: Meds ordered this encounter  Medications  . ciprofloxacin (CIPRO) 750 MG tablet    Sig: Take 1 tablet (750 mg total) by mouth 2 (two) times daily.    Dispense:  20 tablet    Refill:  0  . metroNIDAZOLE (FLAGYL) 500 MG tablet    Sig: Take 1 tablet (500 mg total) by mouth 3 (three) times daily.    Dispense:  30 tablet    Refill:  0

## 2011-12-15 ENCOUNTER — Encounter (HOSPITAL_COMMUNITY): Payer: Self-pay | Admitting: *Deleted

## 2011-12-15 ENCOUNTER — Emergency Department (HOSPITAL_COMMUNITY)
Admission: EM | Admit: 2011-12-15 | Discharge: 2011-12-16 | Disposition: A | Discharge status: Home / Self Care | Payer: Medicare Other | Attending: Emergency Medicine | Admitting: Emergency Medicine

## 2011-12-15 ENCOUNTER — Encounter: Payer: Self-pay | Admitting: *Deleted

## 2011-12-15 DIAGNOSIS — Z79899 Other long term (current) drug therapy: Secondary | ICD-10-CM | POA: Insufficient documentation

## 2011-12-15 DIAGNOSIS — R0602 Shortness of breath: Secondary | ICD-10-CM | POA: Insufficient documentation

## 2011-12-15 DIAGNOSIS — K219 Gastro-esophageal reflux disease without esophagitis: Secondary | ICD-10-CM | POA: Insufficient documentation

## 2011-12-15 DIAGNOSIS — J45909 Unspecified asthma, uncomplicated: Secondary | ICD-10-CM | POA: Insufficient documentation

## 2011-12-15 DIAGNOSIS — M129 Arthropathy, unspecified: Secondary | ICD-10-CM | POA: Insufficient documentation

## 2011-12-15 DIAGNOSIS — Z7982 Long term (current) use of aspirin: Secondary | ICD-10-CM | POA: Insufficient documentation

## 2011-12-15 DIAGNOSIS — R1110 Vomiting, unspecified: Secondary | ICD-10-CM | POA: Insufficient documentation

## 2011-12-15 DIAGNOSIS — R109 Unspecified abdominal pain: Secondary | ICD-10-CM | POA: Insufficient documentation

## 2011-12-15 LAB — DIFFERENTIAL
Eosinophils Absolute: 0.1 10*3/uL (ref 0.0–0.7)
Lymphs Abs: 1 10*3/uL (ref 0.7–4.0)
Monocytes Absolute: 0.5 10*3/uL (ref 0.1–1.0)
Monocytes Relative: 5 % (ref 3–12)
Neutrophils Relative %: 84 % — ABNORMAL HIGH (ref 43–77)

## 2011-12-15 LAB — CBC
HCT: 38.7 % — ABNORMAL LOW (ref 39.0–52.0)
Hemoglobin: 13.8 g/dL (ref 13.0–17.0)
MCH: 29.2 pg (ref 26.0–34.0)
MCV: 82 fL (ref 78.0–100.0)
RBC: 4.72 MIL/uL (ref 4.22–5.81)

## 2011-12-15 LAB — POCT I-STAT, CHEM 8
BUN: 26 mg/dL — ABNORMAL HIGH (ref 6–23)
Creatinine, Ser: 1.1 mg/dL (ref 0.50–1.35)
Potassium: 4 mEq/L (ref 3.5–5.1)
Sodium: 143 mEq/L (ref 135–145)
TCO2: 23 mmol/L (ref 0–100)

## 2011-12-15 MED ORDER — IOHEXOL 300 MG/ML  SOLN: 20.0000 mL | INTRAMUSCULAR | Status: AC

## 2011-12-15 MED ORDER — SODIUM CHLORIDE 0.9 % IV BOLUS (SEPSIS)
1000.0000 mL | Freq: Once | INTRAVENOUS | Status: AC
  Administered 2011-12-16: 1000 mL via INTRAVENOUS

## 2011-12-15 NOTE — ED Provider Notes (Signed)
History     CSN: [id number]  Arrival date & time 12/15/11  2306   First MD Initiated Contact with Patient 12/15/11 2325      Chief Complaint  Patient presents with  . Shortness of Breath  . Emesis    (Consider location/radiation/quality/duration/timing/severity/associated sxs/prior treatment) HPI Comments: 71 year old male with a history of hypertension and asthma presents with a complaint of shortness of breath. According to the patient and his spouse he was seen at the urgent care yesterday and started on ciprofloxacin and Flagyl do to what was thought to be clinical diverticulitis.  This was secondary to some lower abdominal pain that he had been having for several days. He denies any fevers, coughing, back pain, swelling, rashes. He does admit to having some shortness of breath after taking his third dose of antibiotics this evening. He describes the shortness of breath is having some difficulty getting a deep breath and having to work a little harder. He denies coughing, wheezing though he does have a history of asthma.  The symptoms are mild to moderate, persistent, seems to be better with supplemental oxygen.  Patient is a 71 y.o. male presenting with shortness of breath and vomiting. The history is provided by the patient, the spouse and the EMS personnel.  Shortness of Breath  Associated symptoms include shortness of breath.  Emesis     Past Medical History  Diagnosis Date  . Arthritis   . GERD (gastroesophageal reflux disease)   . Hypertension   . Asthma     Past Surgical History  Procedure Date  . Vasectomy   . Lithotripsy     left kidney x 2  . Bone spur removal   . Rotator cuff repair   . Transurethral resection of prostate     Family History  Problem Relation Age of Onset  . Cancer Mother     breast and kidney   . Cancer Father     lung  . Heart disease Father     History  Substance Use Topics  . Smoking status: Never Smoker   . Smokeless tobacco:  Not on file  . Alcohol Use: Not on file      Review of Systems  Respiratory: Positive for shortness of breath.   Gastrointestinal: Positive for vomiting.  All other systems reviewed and are negative.    Allergies  Paroxetine  Home Medications   Current Outpatient Rx  Name Route Sig Dispense Refill  . ALBUTEROL SULFATE HFA 108 (90 BASE) MCG/ACT IN AERS Inhalation Inhale 2 puffs into the lungs every 6 (six) hours as needed. Wheezing or shortness of breath Rarely used    . ASPIRIN 81 MG PO TABS Oral Take 81 mg by mouth daily.      Marland Kitchen CIPROFLOXACIN HCL 750 MG PO TABS Oral Take 1 tablet (750 mg total) by mouth 2 (two) times daily. 20 tablet 0  . GEMFIBROZIL 600 MG PO TABS  Take one tablet daily 90 tablet 3  . LISINOPRIL-HYDROCHLOROTHIAZIDE 20-12.5 MG PO TABS Oral Take 1 tablet by mouth daily. 90 tablet 3  . METRONIDAZOLE 500 MG PO TABS Oral Take 1 tablet (500 mg total) by mouth 3 (three) times daily. 30 tablet 0  . NIACIN ER (ANTIHYPERLIPIDEMIC) 1000 MG PO TBCR  Take one tablet twice a day    . ANDROGEL PUMP TD Transdermal Place 2 application onto the skin daily. 1.62 mg  Application = pumps    . NAPROXEN 500 MG PO TABS Oral Take 1  tablet (500 mg total) by mouth 2 (two) times daily with a meal. 30 tablet 0  . ONDANSETRON 4 MG PO TBDP Oral Take 1 tablet (4 mg total) by mouth every 8 (eight) hours as needed for nausea. 10 tablet 0  . ONDANSETRON HCL 4 MG/2ML IJ SOLN Intravenous Inject 4 mg into the vein once.      BP 122/75  Pulse 80  Temp 98.1 F (36.7 C)  Resp 23  SpO2 96%  Physical Exam  Nursing note and vitals reviewed. Constitutional: He appears well-developed and well-nourished. No distress.  HENT:  Head: Normocephalic and atraumatic.  Mouth/Throat: Oropharynx is clear and moist. No oropharyngeal exudate.       No angioedema of the lips or tongue  Eyes: Conjunctivae and EOM are normal. Pupils are equal, round, and reactive to light. Right eye exhibits no discharge. Left  eye exhibits no discharge. No scleral icterus.  Neck: Normal range of motion. Neck supple. No JVD present. No thyromegaly present.  Cardiovascular: Normal rate, regular rhythm, normal heart sounds and intact distal pulses.  Exam reveals no gallop and no friction rub.   No murmur heard. Pulmonary/Chest: Effort normal and breath sounds normal. No respiratory distress. He has no wheezes. He has no rales.       Breath sounds are absolutely normal with no rales wheezing increased work of breathing accessory muscle use. He is able to speak in full sentences without any distress  Abdominal: Soft. Bowel sounds are normal. He exhibits no distension and no mass. There is tenderness ( Mild bilateral lower abdominal tenderness, no guarding, non-peritoneal).  Musculoskeletal: Normal range of motion. He exhibits no edema and no tenderness.  Lymphadenopathy:    He has no cervical adenopathy.  Neurological: He is alert. Coordination normal.  Skin: Skin is warm and dry. No rash noted. No erythema.  Psychiatric: He has a normal mood and affect. His behavior is normal.    ED Course  Procedures (including critical care time)  Labs Reviewed  CBC - Abnormal; Notable for the following:    HCT 38.7 (*)    All other components within normal limits  DIFFERENTIAL - Abnormal; Notable for the following:    Neutrophils Relative 84 (*)    Neutro Abs 8.2 (*)    Lymphocytes Relative 10 (*)    All other components within normal limits  URINALYSIS, ROUTINE W REFLEX MICROSCOPIC - Abnormal; Notable for the following:    Color, Urine AMBER (*) BIOCHEMICALS MAY BE AFFECTED BY COLOR   Specific Gravity, Urine 1.031 (*)    Ketones, ur 15 (*)    Protein, ur 30 (*)    Leukocytes, UA SMALL (*)    All other components within normal limits  POCT I-STAT, CHEM 8 - Abnormal; Notable for the following:    BUN 26 (*)    Glucose, Bld 149 (*)    All other components within normal limits  URINE MICROSCOPIC-ADD ON   Ct Abdomen  Pelvis W Contrast  12/16/2011  *RADIOLOGY REPORT*  Clinical Data: Abdominal pain, nausea, vomiting, and diarrhea. Lower abdominal pain.  Shortness of breath.  CT ABDOMEN AND PELVIS WITH CONTRAST  Technique:  Multidetector CT imaging of the abdomen and pelvis was performed following the standard protocol during bolus administration of intravenous contrast.  Contrast: OMNIPAQUE IOHEXOL 300 MG/ML  SOLN  Comparison: 04/24/2011  Findings: Mild dependent changes in the lung bases.  Cholelithiasis without gallbladder wall thickening or distension. Liver, spleen, pancreas, adrenal glands, abdominal aorta, and  retroperitoneal lymph nodes are unremarkable.  Large cyst in the right kidney measures the 7 cm diameter.  Nonobstructing stones in the lower pole right kidney and in the right renal pelvis, measuring up to 11 mm diameter.  The stones in the renal pelvis have progressed from the collecting system since the previous study.  No ureterectasis or ureteral stones.  The stomach and small bowel demonstrate no evidence of distension or wall thickening. Contrast material and stool in the colon.  No distension or wall thickening.  No free air or free fluid in the abdomen.  Pelvis:  Diverticulosis of the sigmoid colon without evidence of inflammatory change.  Nothing to suggest diverticulitis.  The prostate gland is not enlarged.  The bladder wall is not thickened. The appendix is normal.  No significant pelvic lymphadenopathy. The spondylolysis with mild spondylolisthesis of L5 on the sacrum.  IMPRESSION: Diverticulosis without evidence of diverticulitis.  No focal inflammatory process demonstrated in the pelvis.  Cholelithiasis. Renal cysts.  Nonobstructing stone in the lower pole right kidney. Nonobstructing stones in the right renal pelvis which have moved to that location since the previous study.  Original Report Authenticated By: Marlon Pel, M.D.     1. Abdominal pain       MDM  Vital signs reflect  no significant abnormalities, he is afebrile, normal pulse of 85, blood pressure of 122/70 and oxygen saturations of 98-100% on room air as I examine the patient. He states that he feels better with supplemental oxygen however he stated this after I turned the oxygen off 10 minutes prior. He has normal lung sounds area and this may be related to a medication reaction. At this point we will do CT scan to rule out true diverticulitis as he may not need antibiotics anyway. He declines pain medications nausea medicines at this time. Labs and CT scan pending  Patient has been reevaluated several times, remains to have normal vital signs, no shortness of breath and minimal abdominal pain. On repeat abdominal exam there is minimal lower abdominal tenderness, urinalysis shows dehydration, patient given IV fluid bolus and has been tolerating by mouth fluids at home. We'll discharge him with pain medications, nausea medicines and followup as needed. Will stop antibiotics, patient informed of treatment plan and is agreeable with same.  Discharge Prescriptions include:  #1 Zofran  #2 Naprosyn      Vida Roller, MD 12/16/11 [phone]

## 2011-12-15 NOTE — ED Notes (Signed)
Received pt. From EMS with C/O N/V/D, pt. Seen at PCP yesterday started on abx. For diverticulosis, pt. Alert and oriented NAD noted, IV access per EMS N/S bolus, and Zofran given

## 2011-12-16 ENCOUNTER — Emergency Department (HOSPITAL_COMMUNITY): Payer: Medicare Other

## 2011-12-16 ENCOUNTER — Telehealth: Payer: Self-pay

## 2011-12-16 LAB — URINALYSIS, ROUTINE W REFLEX MICROSCOPIC
Glucose, UA: NEGATIVE mg/dL
Hgb urine dipstick: NEGATIVE
Protein, ur: 30 mg/dL — AB

## 2011-12-16 LAB — URINE MICROSCOPIC-ADD ON

## 2011-12-16 MED ORDER — IOHEXOL 300 MG/ML  SOLN
100.0000 mL | Freq: Once | INTRAMUSCULAR | Status: AC | PRN
  Administered 2011-12-16: 100 mL via INTRAVENOUS

## 2011-12-16 MED ORDER — NAPROXEN 500 MG PO TABS: 500.0000 mg | ORAL_TABLET | Freq: Two times a day (BID) | ORAL | Status: DC

## 2011-12-16 MED ORDER — ONDANSETRON 4 MG PO TBDP: 4.0000 mg | ORAL_TABLET | Freq: Three times a day (TID) | ORAL | Status: AC | PRN

## 2011-12-16 NOTE — Telephone Encounter (Signed)
Pt saw  caller ID and pt called back. Pt is feeling better today, still some abdominal pain but thinks could be kidney stone and pt already has appt with urologist tomorrow. Pt said has CPX with Dr Ermalene Searing 01/19/12 and plans to keep that appt. Pt is not sure whether N&V and trouble breathing was related to Cipro or Flagyl but would like one or both noted in pts chart as allergic reaction.Please advise.

## 2011-12-16 NOTE — Telephone Encounter (Signed)
INTOLERANCE DOCUMENTED

## 2011-12-16 NOTE — Telephone Encounter (Signed)
Unlike to be true allergy - discussed with Herbert Seta who will note in chart

## 2011-12-16 NOTE — ED Notes (Signed)
Pt. Discharged to home, pt. Alert and oriented, ambulatory gait steady, NAD noted 

## 2011-12-16 NOTE — Telephone Encounter (Signed)
Pt left v/m;Pt saw Dr Patsy Lager on 12/14/11 with abdominal pain. Pt said had reaction to antibiotic with N&V and breathing problem and  Pt seen at [facility] ER on 12/15/11 and had CT scan and was told no diverticulitis.  since pt does not have diverticulitis he said he will not take anymore antibiotic. Pt wanted update information for Dr Patsy Lager. Pt left [phone] as contact #. I tried to call pt to get more info and no answer and no v/m.

## 2011-12-16 NOTE — Discharge Instructions (Signed)
You have been diagnosed with undifferentiated abdominal pain.  Abdominal pain can be caused by many things. Your caregiver evaluates the seriousness of your pain by an examination and possibly blood or urine tests and imaging (CT scan, x-rays, ultrasound). Many cases can be observed and treated at home after initial evaluation in the emergency department. Even though you are being discharged home, abdominal pain can be unpredictable. Therefore, you need a repeat exam if your pain does not resolve, returns, or worsens. Most patient's with abdominal pain do not need to be admitted to the hospital or have surgery, but serious problems like appendicitis and gallbladder attacks can start out as nonspecific pain. Many abdominal conditions cannot be diagnosed in 1 visit, so followup evaluations are very important.  Seek immediate medical attention if:  *The pain does not go away or becomes severe. *Temperature above 101 develops *Repeated vomiting occurs(multiple episodes) *The pain becomes localized to portions of the abdomen. The right side could possibly be appendicitis. In an adult, the left lower portion of the abdomen could be colitis or diverticulitis. *Blood is being passed in stools or vomit *Return also if you develop chest pain, difficulty breathing, dizziness or fainting, or become confused poorly responsive or inconsolable (young children).     If you do not have a physician, you should reference the below phone numbers and call in the morning to establish follow up care.  RESOURCE GUIDE  Dental Problems  Patients with Medicaid: [facility]                     [facility] [address]                                           [address] Phone:  [phone]                                                  Phone:  [phone]  If unable to pay or uninsured, contact:  [facility] or [facility] to become qualified for the adult dental  clinic.  Chronic Pain Problems Contact [facility]  [phone] Patients need to be referred by their primary care doctor.  Insufficient Money for Medicine Contact [employer]:  call "211" or [facility] 271-5999.  No Primary Care Doctor Call [facility]  832-8000 Other agencies that provide inexpensive medical care    [facility]  [phone]    [facility]  [phone]    [facility]  [phone]    [facility] Clinic  [phone]    [facility]  [phone]    [facility]  [phone]  Psychological [facility]  [phone] [facility]  [phone] [facility]   [phone] (emergency services [phone])  Substance Abuse Resources [facility]  336-882-2125 [facility] 336-784-9470 [facility] 336-285-9073 [facility] 336-845-3988 Residential & Outpatient Substance Abuse Program  800-659-3381  Abuse/Neglect [place] Child Abuse Hotline (336) 641-3795 [place] Child Abuse Hotline 800-378-5315 (After Hours)  Emergency Shelter [facility] (336) 271-5985  [facility] (336) 275-9566 [facility]   [facility] (704) 372-4663  MRSA Hotline #:   832-7006    [place] [facility]  [facility] [place]     [employer]                          [facility] [address] [place]                       [address]      [address]                                                  [place]                            [place] Phone:  [phone]                                   Phone:  [phone]                 Phone:  [phone]  [facility] Phone:  [phone]  [place] Child Abuse Hotline (336) 342-1394 (336) 342-3537 (After Hours)    

## 2011-12-17 ENCOUNTER — Encounter: Payer: 59 | Admitting: Family Medicine

## 2011-12-18 ENCOUNTER — Other Ambulatory Visit: Payer: Self-pay | Admitting: Urology

## 2011-12-28 ENCOUNTER — Other Ambulatory Visit: Payer: 59

## 2011-12-31 ENCOUNTER — Encounter: Payer: 59 | Admitting: Family Medicine

## 2012-01-04 ENCOUNTER — Encounter (HOSPITAL_BASED_OUTPATIENT_CLINIC_OR_DEPARTMENT_OTHER): Payer: Self-pay | Admitting: *Deleted

## 2012-01-05 ENCOUNTER — Encounter (HOSPITAL_BASED_OUTPATIENT_CLINIC_OR_DEPARTMENT_OTHER): Payer: Self-pay | Admitting: *Deleted

## 2012-01-05 NOTE — Progress Notes (Signed)
NPO AFTER MN. ARRIVES AT 0900. NEEDS ISTAT. CURRENT EKG IN CHART AND EPIC. MAY TAKE PERCOCET/ ZOFRAN IF NEEDED W/ SIPS OF WATER.

## 2012-01-11 ENCOUNTER — Encounter (HOSPITAL_BASED_OUTPATIENT_CLINIC_OR_DEPARTMENT_OTHER): Payer: Self-pay | Admitting: *Deleted

## 2012-01-11 ENCOUNTER — Ambulatory Visit (HOSPITAL_BASED_OUTPATIENT_CLINIC_OR_DEPARTMENT_OTHER): Payer: Medicare Other | Admitting: Anesthesiology

## 2012-01-11 ENCOUNTER — Ambulatory Visit (HOSPITAL_BASED_OUTPATIENT_CLINIC_OR_DEPARTMENT_OTHER)
Admission: RE | Admit: 2012-01-11 | Discharge: 2012-01-11 | Disposition: A | Discharge status: Home / Self Care | Payer: Medicare Other | Source: Ambulatory Visit | Attending: Urology | Admitting: Urology

## 2012-01-11 ENCOUNTER — Encounter (HOSPITAL_BASED_OUTPATIENT_CLINIC_OR_DEPARTMENT_OTHER)
Admission: RE | Disposition: A | Discharge status: Home / Self Care | Payer: Self-pay | Source: Ambulatory Visit | Attending: Urology

## 2012-01-11 ENCOUNTER — Encounter (HOSPITAL_BASED_OUTPATIENT_CLINIC_OR_DEPARTMENT_OTHER): Payer: Self-pay | Admitting: Anesthesiology

## 2012-01-11 DIAGNOSIS — R972 Elevated prostate specific antigen [PSA]: Secondary | ICD-10-CM | POA: Insufficient documentation

## 2012-01-11 DIAGNOSIS — I10 Essential (primary) hypertension: Secondary | ICD-10-CM | POA: Insufficient documentation

## 2012-01-11 DIAGNOSIS — K219 Gastro-esophageal reflux disease without esophagitis: Secondary | ICD-10-CM | POA: Insufficient documentation

## 2012-01-11 DIAGNOSIS — J45909 Unspecified asthma, uncomplicated: Secondary | ICD-10-CM | POA: Insufficient documentation

## 2012-01-11 DIAGNOSIS — N400 Enlarged prostate without lower urinary tract symptoms: Secondary | ICD-10-CM | POA: Insufficient documentation

## 2012-01-11 DIAGNOSIS — N200 Calculus of kidney: Secondary | ICD-10-CM | POA: Insufficient documentation

## 2012-01-11 HISTORY — DX: Unspecified asthma, uncomplicated: J45.909

## 2012-01-11 HISTORY — DX: Personal history of urinary calculi: Z87.442

## 2012-01-11 HISTORY — DX: Dermatitis, unspecified: L30.9

## 2012-01-11 HISTORY — DX: Benign prostatic hyperplasia without lower urinary tract symptoms: N40.0

## 2012-01-11 HISTORY — DX: Calculus of kidney: N20.0

## 2012-01-11 HISTORY — DX: Cyst of kidney, acquired: N28.1

## 2012-01-11 LAB — POCT I-STAT 4, (NA,K, GLUC, HGB,HCT): Hemoglobin: 14.6 g/dL (ref 13.0–17.0)

## 2012-01-11 SURGERY — CYSTOURETEROSCOPY, WITH RETROGRADE PYELOGRAM AND STENT INSERTION
Anesthesia: General | Site: Ureter | Laterality: Right | Wound class: Clean Contaminated

## 2012-01-11 MED ORDER — ACETAMINOPHEN 325 MG PO TABS: 650.0000 mg | ORAL_TABLET | ORAL | Status: DC | PRN

## 2012-01-11 MED ORDER — PROMETHAZINE HCL 25 MG/ML IJ SOLN
6.2500 mg | INTRAMUSCULAR | Status: DC | PRN
  Administered 2012-01-11: 6.25 mg via INTRAVENOUS

## 2012-01-11 MED ORDER — OXYBUTYNIN CHLORIDE 5 MG PO TABS: 5.0000 mg | ORAL_TABLET | Freq: Three times a day (TID) | ORAL | Status: DC

## 2012-01-11 MED ORDER — PROPOFOL 10 MG/ML IV EMUL
INTRAVENOUS | Status: DC | PRN
  Administered 2012-01-11: 250 mg via INTRAVENOUS

## 2012-01-11 MED ORDER — DEXAMETHASONE SODIUM PHOSPHATE 4 MG/ML IJ SOLN
INTRAMUSCULAR | Status: DC | PRN
  Administered 2012-01-11: 10 mg via INTRAVENOUS

## 2012-01-11 MED ORDER — ONDANSETRON HCL 4 MG/2ML IJ SOLN: 4.0000 mg | Freq: Once | INTRAMUSCULAR | Status: DC

## 2012-01-11 MED ORDER — SODIUM CHLORIDE 0.9 % IR SOLN
Status: DC | PRN
  Administered 2012-01-11: 6000 mL

## 2012-01-11 MED ORDER — CEPHALEXIN 500 MG PO CAPS: 500.0000 mg | ORAL_CAPSULE | Freq: Four times a day (QID) | ORAL | Status: DC

## 2012-01-11 MED ORDER — FENTANYL CITRATE 0.05 MG/ML IJ SOLN
INTRAMUSCULAR | Status: DC | PRN
  Administered 2012-01-11: 50 ug via INTRAVENOUS
  Administered 2012-01-11 (×3): 25 ug via INTRAVENOUS
  Administered 2012-01-11: 50 ug via INTRAVENOUS
  Administered 2012-01-11 (×2): 25 ug via INTRAVENOUS

## 2012-01-11 MED ORDER — IOHEXOL 350 MG/ML SOLN
INTRAVENOUS | Status: DC | PRN
  Administered 2012-01-11: 60 mL

## 2012-01-11 MED ORDER — SODIUM CHLORIDE 0.9 % IJ SOLN: 3.0000 mL | Freq: Two times a day (BID) | INTRAMUSCULAR | Status: DC

## 2012-01-11 MED ORDER — MORPHINE SULFATE 2 MG/ML IJ SOLN: 2.0000 mg | INTRAMUSCULAR | Status: DC | PRN

## 2012-01-11 MED ORDER — BELLADONNA ALKALOIDS-OPIUM 16.2-60 MG RE SUPP
RECTAL | Status: DC | PRN
  Administered 2012-01-11: 1 via RECTAL

## 2012-01-11 MED ORDER — LIDOCAINE HCL (CARDIAC) 20 MG/ML IV SOLN
INTRAVENOUS | Status: DC | PRN
  Administered 2012-01-11: 80 mg via INTRAVENOUS

## 2012-01-11 MED ORDER — ONDANSETRON HCL 4 MG/2ML IJ SOLN
INTRAMUSCULAR | Status: DC | PRN
  Administered 2012-01-11: 4 mg
  Administered 2012-01-11: 4 mg via INTRAVENOUS

## 2012-01-11 MED ORDER — LACTATED RINGERS IV SOLN
INTRAVENOUS | Status: DC
Start: 2012-01-11 — End: 2012-01-11
  Administered 2012-01-11 (×4): via INTRAVENOUS

## 2012-01-11 MED ORDER — OXYCODONE HCL 5 MG PO TABS
5.0000 mg | ORAL_TABLET | ORAL | Status: DC | PRN
  Administered 2012-01-11 (×2): 5 mg via ORAL

## 2012-01-11 MED ORDER — ACETAMINOPHEN 650 MG RE SUPP: 650.0000 mg | RECTAL | Status: DC | PRN

## 2012-01-11 MED ORDER — SODIUM CHLORIDE 0.9 % IJ SOLN: 3.0000 mL | INTRAMUSCULAR | Status: DC | PRN

## 2012-01-11 MED ORDER — EPHEDRINE SULFATE 50 MG/ML IJ SOLN
INTRAMUSCULAR | Status: DC | PRN
  Administered 2012-01-11: 10 mg via INTRAVENOUS

## 2012-01-11 MED ORDER — ONDANSETRON HCL 4 MG/2ML IJ SOLN: 4.0000 mg | Freq: Four times a day (QID) | INTRAMUSCULAR | Status: DC | PRN

## 2012-01-11 MED ORDER — CEFAZOLIN SODIUM 1-5 GM-% IV SOLN
1.0000 g | INTRAVENOUS | Status: AC
  Administered 2012-01-11: 2 g via INTRAVENOUS

## 2012-01-11 MED ORDER — FENTANYL CITRATE 0.05 MG/ML IJ SOLN: 25.0000 ug | INTRAMUSCULAR | Status: DC | PRN

## 2012-01-11 MED ORDER — SODIUM CHLORIDE 0.9 % IV SOLN
250.0000 mL | INTRAVENOUS | Status: DC | PRN
Start: 2012-01-11 — End: 2012-01-11

## 2012-01-11 SURGICAL SUPPLY — 23 items
ADAPTER CATH URET PLST 4-6FR (CATHETERS) IMPLANT
ADPR CATH URET STRL DISP 4-6FR (CATHETERS)
BAG DRAIN URO-CYSTO SKYTR STRL (DRAIN) ×3 IMPLANT
BAG DRN UROCATH (DRAIN) ×2
BASKET ZERO TIP NITINOL 2.4FR (BASKET) ×1 IMPLANT
BSKT STON RTRVL ZERO TP 2.4FR (BASKET) ×2
CANISTER SUCT LVC 12 LTR MEDI- (MISCELLANEOUS) IMPLANT
CATH INTERMIT  6FR 70CM (CATHETERS) IMPLANT
CLOTH BEACON ORANGE TIMEOUT ST (SAFETY) ×3 IMPLANT
DRAPE CAMERA CLOSED 9X96 (DRAPES) ×3 IMPLANT
GLOVE BIO SURGEON STRL SZ8 (GLOVE) ×3 IMPLANT
GOWN PREVENTION PLUS LG XLONG (DISPOSABLE) ×3 IMPLANT
GOWN STRL REIN XL XLG (GOWN DISPOSABLE) ×3 IMPLANT
GUIDEWIRE 0.038 PTFE COATED (WIRE) IMPLANT
GUIDEWIRE ANG ZIPWIRE 038X150 (WIRE) IMPLANT
GUIDEWIRE STR DUAL SENSOR (WIRE) IMPLANT
IV NS IRRIG 3000ML ARTHROMATIC (IV SOLUTION) ×3 IMPLANT
KIT BALLN UROMAX 15FX4 (MISCELLANEOUS) IMPLANT
KIT BALLN UROMAX 26 75X4 (MISCELLANEOUS) ×1
LASER FIBER DISP (UROLOGICAL SUPPLIES) ×1 IMPLANT
NS IRRIG 500ML POUR BTL (IV SOLUTION) IMPLANT
PACK CYSTOSCOPY (CUSTOM PROCEDURE TRAY) ×3 IMPLANT
SHEATH ACCESS URETERAL 54CM (SHEATH) ×1 IMPLANT

## 2012-01-11 NOTE — Anesthesia Procedure Notes (Signed)
Procedure Name: LMA Insertion Date/Time: 01/11/2012 9:51 AM Performed by: Fran Lowes Pre-anesthesia Checklist: Patient identified, Emergency Drugs available, Suction available and Patient being monitored Patient Re-evaluated:Patient Re-evaluated prior to inductionOxygen Delivery Method: Circle System Utilized Preoxygenation: Pre-oxygenation with 100% oxygen Intubation Type: IV induction Ventilation: Mask ventilation without difficulty LMA: LMA inserted LMA Size: 4.0 Number of attempts: 1 Airway Equipment and Method: bite block Placement Confirmation: positive ETCO2 Tube secured with: Tape Dental Injury: Teeth and Oropharynx as per pre-operative assessment

## 2012-01-11 NOTE — Anesthesia Postprocedure Evaluation (Signed)
  Anesthesia Post-op Note  Patient: Jack Norris  Procedure(s) Performed: Procedure(s) (LRB): CYSTOSCOPY WITH RETROGRADE PYELOGRAM, URETEROSCOPY AND STENT PLACEMENT (Right) HOLMIUM LASER APPLICATION (Right)  Patient Location: PACU  Anesthesia Type: General  Level of Consciousness: awake and alert   Airway and Oxygen Therapy: Patient Spontanous Breathing  Post-op Pain: mild  Post-op Assessment: Post-op Vital signs reviewed, Patient's Cardiovascular Status Stable, Respiratory Function Stable, Patent Airway and No signs of Nausea or vomiting  Post-op Vital Signs: stable  Complications: No apparent anesthesia complications

## 2012-01-11 NOTE — Transfer of Care (Signed)
Immediate Anesthesia Transfer of Care Note  Patient: Jack Norris  Procedure(s) Performed: Procedure(s) (LRB): CYSTOSCOPY WITH RETROGRADE PYELOGRAM, URETEROSCOPY AND STENT PLACEMENT (Right) HOLMIUM LASER APPLICATION (Right)  Patient Location: Patient transported to PACU with oxygen via face mask at 4 Liters / Min  Anesthesia Type: General  Level of Consciousness: awake and alert   Airway & Oxygen Therapy: Patient Spontanous Breathing and Patient connected to face mask oxygen  Post-op Assessment: Report given to PACU RN and Post -op Vital signs reviewed and stable  Post vital signs: Reviewed and stable  Dentition: Teeth and oropharynx remain in pre-op condition  Complications: No apparent anesthesia complications

## 2012-01-11 NOTE — H&P (Signed)
Urology History and Physical Exam  CC: Right sided kidney stones  HPI: 71 year old male with the following history:  History of Present Illness   This man returns for followup of the below list of issues:  BPH  He was first seen in August, 2010. At that time, he presented for second opinion regarding management of BPH. He had been on Jalyn for a few months prior. Dr. Artis Flock, in [place], recommended thermotherapy. Because of the size of his prostate, as well as his symptoms, I recommended TURP. He concurred. He underwent TURP using the gyrus button in April, 2011. Prior to that, by ultrasound, prostatic volume was 55 g.   HYPOGONADISM  Prior to his presentation, he had been on androgen repletion, started by Dr. Artis Flock. He continues on that, and is on AndroGel daily. His testosterone levels have been physiologic.  PSA, ELEVATED  Prior to his presentation here, in May of 2010 he underwent ultrasound and biopsy of the prostate by Dr. Artis Flock. By history, his PSA was approximately 5. Glandular size was 55 g, biopsies were negative. His PSAs have been appropriately low, with the last PSA checked in July of 2012 being 1.2.  GROSS HEMATURIA  The patient noted dark colored urine. He underwent CT of the abdomen and pelvis in August, 2012 which revealed a 7 mm right lower pole stone as well as a 1 mm left renal stone. It also revealed a 7 cm simple right renal cyst.  BILATERAL RENAL CALCULI  He does have a history of urolithiasis, and lithotripsy. He has a 9 x 7 mm right lower pole stone, somewhat branched. There also are noted left renal calculi.   His right sided stones have been more symptomatic recently, with pain and intermittent hematuria. He presents now for ureteroscopic treatment instead of percutaneous or extracorporal treatment due to the presence of large renal cysts.   PMH: Past Medical History  Diagnosis Date  . Arthritis   . Hypertension   . Eczema   . BPH (benign  prostatic hypertrophy)   . Simple renal cyst right interpoler, simpler in nature , asymptomatic  per urologist note (dr Diron Haddon)  12-26-2011  . Renal calculi right   . Asthma, mild   . Reflux OCCASIONAL-  WATCHES DIET  . History of kidney stones     PSH: Past Surgical History  Procedure Date  . Vasectomy 1970'S    GEN. ANES.  . Lithotripsy APPROX.  1998    left kidney x 2  . Bone spur removal     BILATERAL GREAT TOE  . Rotator cuff repair 2012    LEFT SHOULDER  . Transurethral resection of prostate 11-29-2009    BPH    Allergies: Allergies  Allergen Reactions  . Paroxetine     REACTION: Had mild personality change on this medication and doesn't want to take this again.  . Ciprofloxacin Other (See Comments)    ABDOMINAL PAIN/ sob  . Flagyl (Metronidazole Hcl) Other (See Comments)    ABDOMINAL PAIN/ sob    Medications: No prescriptions prior to admission     Social History: History   Social History  . Marital Status: Married    Spouse Name: N/A    Number of Children: 3  . Years of Education: N/A   Occupational History  . 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 Smoking status: Former Smoker -- 8 years    Types: Cigarettes    Quit date: 01/05/1967  .  Smokeless tobacco: Never Used   Comment: QUIT AGE 30  . Alcohol Use: Yes     OCCASIONAL  . Drug Use: No  . Sexually Active: Not on file   Other Topics Concern  . Not on file   Social History Narrative  . No narrative on file    Family History: Family History  Problem Relation Age of Onset  . Cancer Mother     breast and kidney   . Cancer Father     lung  . Heart disease Father     Review of Systems: Positive: Gross hematuria, flank pain Negative:  A further 10 point review of systems was negative except what is listed in the HPI.  Physical Exam: @VITALS2 @ General: No acute distress.  Awake. Head:  Normocephalic.  Atraumatic. ENT:  EOMI.  Mucous membranes  moist Neck:  Supple.  No lymphadenopathy. CV:  S1 present. S2 present. Regular rate. Pulmonary: Equal effort bilaterally.  Clear to auscultation bilaterally. Abdomen: Soft.  Non tender to palpation. Skin:  Normal turgor.  No visible rash. Extremity: No gross deformity of bilateral upper extremities.  No gross deformity of    bilateral lower extremities. Neurologic: Alert. Appropriate mood.    Studies:  No results found for this basename: HGB:2,WBC:2,PLT:2 in the last 72 hours  No results found for this basename: NA:2,K:2,CL:2,CO2:2,BUN:2,CREATININE:2,CALCIUM:2,MAGNESIUM:2,GFRNONAA:2,GFRAA:2 in the last 72 hours   No results found for this basename: PT:2,INR:2,APTT:2 in the last 72 hours   No components found with this basename: ABG:2    Assessment:  Right lower pole and UPJ stones  Plan: Right ureteroscopy and laser treatment/extraction of stones.

## 2012-01-11 NOTE — Discharge Instructions (Signed)
POSTOPERATIVE CARE AFTER URETEROSCOPY ° °Stent management ° °*Stents are often left in after ureteroscopy and stone treatment. If left in, they often cause urinary frequency, urgency, occasional blood in the urine, as well as flank discomfort with urination. These are all expected issues, and should resolve after the stent is removed. °*Often times, a small thread is left on the end of the stent, and brought out through the urethra. If so, this is used to remove the stent, making it unnecessary to look in the bladder with a scope in the office to remove the stent. If a thread is left on, did not pull on it until instructed. ° °Diet ° °Once you have adequately recovered from anesthesia, you may gradually advance your diet, as tolerated, to your regular diet. ° °Activities ° °You may gradually increase your activities to your normal unrestricted level the day following your procedure. ° °Medications ° °You should resume all preoperative medications. If you are on aspirin-like compounds, you should not resume these until the blood clears from your urine. If given an antibiotic by the surgeon, take these until they are completed. You may also be given, if you have a stent, medications to decrease the urinary frequency and urgency. ° °Pain ° °After ureteroscopy, there may be some pain on the side of the scope. Take your pain medicine for this. Usually, this pain resolves within a day or 2. ° °Fever ° °Please report any fever over 100° to the doctor. ° °Post Anesthesia Home Care Instructions ° °Activity: °Get plenty of rest for the remainder of the day. A responsible adult should stay with you for 24 hours following the procedure.  °For the next 24 hours, DO NOT: °-Drive a car °-Operate machinery °-Drink alcoholic beverages °-Take any medication unless instructed by your physician °-Make any legal decisions or sign important papers. ° °Meals: °Start with liquid foods such as gelatin or soup. Progress to regular foods as  tolerated. Avoid greasy, spicy, heavy foods. If nausea and/or vomiting occur, drink only clear liquids until the nausea and/or vomiting subsides. Call your physician if vomiting continues. ° °Special Instructions/Symptoms: °Your throat may feel dry or sore from the anesthesia or the breathing tube placed in your throat during surgery. If this causes discomfort, gargle with warm salt water. The discomfort should disappear within 24 hours. ° °

## 2012-01-11 NOTE — Anesthesia Preprocedure Evaluation (Signed)
Anesthesia Evaluation  Patient identified by MRN, date of birth, ID band Patient awake    Reviewed: Allergy & Precautions, H&P , NPO status , Patient's Chart, lab work & pertinent test results  Airway Mallampati: II TM Distance: >3 FB Neck ROM: Full    Dental No notable dental hx. (+)    Pulmonary asthma ,  breath sounds clear to auscultation  Pulmonary exam normal       Cardiovascular Exercise Tolerance: Good hypertension, Pt. on medications Rhythm:Regular Rate:Normal  ECG: SR LAD   Neuro/Psych negative neurological ROS  negative psych ROS   GI/Hepatic Neg liver ROS, GERD-  Controlled,  Endo/Other  negative endocrine ROS  Renal/GU negative Renal ROS  negative genitourinary   Musculoskeletal negative musculoskeletal ROS (+)   Abdominal (+) + obese,   Peds negative pediatric ROS (+)  Hematology negative hematology ROS (+)   Anesthesia Other Findings   Reproductive/Obstetrics negative OB ROS                          Anesthesia Physical  Anesthesia Plan  ASA: II  Anesthesia Plan: General   Post-op Pain Management:    Induction: Intravenous  Airway Management Planned: LMA  Additional Equipment:   Intra-op Plan:   Post-operative Plan: Extubation in OR  Informed Consent: I have reviewed the patients History and Physical, chart, labs and discussed the procedure including the risks, benefits and alternatives for the proposed anesthesia with the patient or authorized representative who has indicated his/her understanding and acceptance.   Dental advisory given  Plan Discussed with: CRNA  Anesthesia Plan Comments:         Anesthesia Quick Evaluation  

## 2012-01-19 ENCOUNTER — Ambulatory Visit (INDEPENDENT_AMBULATORY_CARE_PROVIDER_SITE_OTHER): Payer: Medicare Other | Admitting: Family Medicine

## 2012-01-19 ENCOUNTER — Encounter: Payer: Self-pay | Admitting: Family Medicine

## 2012-01-19 VITALS — BP 118/80 | HR 91 | Temp 98.4°F | Ht 69.5 in | Wt 209.4 lb

## 2012-01-19 DIAGNOSIS — E7800 Pure hypercholesterolemia, unspecified: Secondary | ICD-10-CM

## 2012-01-19 DIAGNOSIS — R202 Paresthesia of skin: Secondary | ICD-10-CM

## 2012-01-19 DIAGNOSIS — Z0000 Encounter for general adult medical examination without abnormal findings: Secondary | ICD-10-CM

## 2012-01-19 DIAGNOSIS — I10 Essential (primary) hypertension: Secondary | ICD-10-CM

## 2012-01-19 DIAGNOSIS — R209 Unspecified disturbances of skin sensation: Secondary | ICD-10-CM

## 2012-01-19 DIAGNOSIS — E781 Pure hyperglyceridemia: Secondary | ICD-10-CM

## 2012-01-19 NOTE — Assessment & Plan Note (Signed)
Possibly due to meralgia paresthetica... Info given.  With noted ... Foot flapping ? Foot drop on right... Pt considering neuro eval.

## 2012-01-19 NOTE — Progress Notes (Signed)
Subjective:    Patient ID: Jack Norris, male    DOB: Jun 13, 1941, 71 y.o.   MRN: [id number]  HPI  71 year old male presents for annual exam. He states he does not have "real"medicare and does not want a medicare wellness exam.  Retired now in last year.  Elevated Cholesterol:  At goal on gemfibrozil, niacin Lab Results  Component Value Date   CHOL 183 10/03/2010   HDL 42.40 10/03/2010   LDLCALC 124* 10/03/2010   LDLDIRECT 107.6 12/14/2011   TRIG 82.0 10/03/2010   CHOLHDL 4 10/03/2010   Using medications without problems:None Diet compliance: Good Exercise:None in last month. Has lost 5 lbs Other complaints:  Hypertension:  Well controlled on lisinopril HCTZ   Using medication without problems or lightheadedness: None Chest pain with exertion:None Edema:None Short of breath:None Average home BPs: 125-130/80 Other issues:  Abdominal pain:  9 months ago.. CT scan showed diverticulitits: Given antibiotics. Had a serious reaction to metronidazole (SOB). EKG was unremarkable. Went to  ER.. repeta CT showed stones. Had kidney stone procedure for 6 stones 1 week ago with Dr. Hillis Range. Stent placed.  Has had bleeding since in urine.. But expected per URO. No dysuria. On tramadol for pain.  Review of Systems  Constitutional: Negative for fever, fatigue and unexpected weight change.  HENT: Negative for ear pain, congestion, sore throat, rhinorrhea, trouble swallowing and postnasal drip.   Eyes: Negative for pain.  Respiratory: Negative for cough, shortness of breath and wheezing.   Cardiovascular: Negative for chest pain, palpitations and leg swelling.  Gastrointestinal: Negative for nausea, abdominal pain, diarrhea, constipation and blood in stool.  Genitourinary: Negative for dysuria, urgency, hematuria, discharge, penile swelling, scrotal swelling, difficulty urinating, penile pain and testicular pain.  Musculoskeletal: Negative for back pain.       Has noted mild burning (no  numbness) on left lateral leg in last year. Saw derm, no changes in skin. Wife has noted that he flops his right foot in last year Has not noted weakness.  Skin: Negative for rash.  Neurological: Negative for syncope, weakness, light-headedness, numbness and headaches.       No slurred speech, no vision changes.  Psychiatric/Behavioral: Negative for behavioral problems, confusion and dysphoric mood. The patient is not nervous/anxious.        Objective:   Physical Exam  Constitutional: He appears well-developed and well-nourished.  Non-toxic appearance. He does not appear ill. No distress.  HENT:  Head: Normocephalic and atraumatic.  Right Ear: Hearing, tympanic membrane, external ear and ear canal normal.  Left Ear: Hearing, tympanic membrane, external ear and ear canal normal.  Nose: Nose normal.  Mouth/Throat: Uvula is midline, oropharynx is clear and moist and mucous membranes are normal.  Eyes: Conjunctivae, EOM and lids are normal. Pupils are equal, round, and reactive to light. No foreign bodies found.  Neck: Trachea normal, normal range of motion and phonation normal. Neck supple. Carotid bruit is not present. No mass and no thyromegaly present.  Cardiovascular: Normal rate, regular rhythm, S1 normal, S2 normal, intact distal pulses and normal pulses.  Exam reveals no gallop.   No murmur heard. Pulmonary/Chest: Breath sounds normal. He has no wheezes. He has no rhonchi. He has no rales.  Abdominal: Soft. Normal appearance and bowel sounds are normal. There is no hepatosplenomegaly. There is no tenderness. There is no rebound, no guarding and no CVA tenderness. No hernia.  Lymphadenopathy:    He has no cervical adenopathy.       Left:  No inguinal adenopathy present.  Neurological: He is alert. He has normal strength and normal reflexes. No cranial nerve deficit or sensory deficit. Gait normal.  Skin: Skin is warm, dry and intact. No rash noted.  Psychiatric: He has a normal mood  and affect. His speech is normal and behavior is normal. Judgment normal.    Foot exam: Normal inspection No skin breakdown No calluses  Normal DP pulses Normal sensation to light touch and monofilament Nails normal       Assessment & Plan:   Vaccines: Uptodate with shingles, PNA and Tdap Colon: 10/2005, [facility], nml,  plan every 10 years. Prostate: per uro. PSA nml on 12/14/2011 labs Former smoker, remotely

## 2012-01-19 NOTE — Patient Instructions (Addendum)
Return for fasting labs in 6 months prior to  6 month follow up appt.  Call if interested in neurology referral.

## 2012-01-25 NOTE — Op Note (Signed)
Preoperative diagnosis: Right renal pelvis and right lower pole renal calculi  Postoperative diagnosis: Same  Principal procedure: Cystoscopy, right retrograde ureteropyelogram, interpretive fluoroscopy, right ureteroscopy following dilatation of right ureter, holmium laser lithotripsy of right renal pelvic and right lower pole calculi, double-J stent placement  Surgeon: Taneeka Curtner  Anesthesia: Gen.  Complications: None  Drains: 24 cm x 6 French contour double-J stent  Indications: 71 year old male with recurrent hematuria and evidence of a right renal pelvic and right lower pole stone. He has large renal cyst on the right which precludes percutaneous and extracorporeal management. He presents at this time for ureteroscopy and holmium laser of his renal calculi.  Procedure: The patient was properly identified and marked in the holding area and received preoperative IV antibiotics. He was taken the operating room where general anesthetic was administered with the LMA. He is placed in the dorsolithotomy position. Genitalia and perineum were prepped and draped. Timeout was then performed.  A 22 French panendoscope was advanced and the patient's bladder. He had moderate obstruction from BPH. Bladder was inspected circumferentially. There were moderate trabeculations. No tumors or foreign bodies were noted.   A 6 French ureteral catheter was used to perform a retrograde. This revealed a normal ureter throughout, with no evident filling defects or hydronephrosis. The pyelo-calyceal system was normal except for a filling defect in the right renal pelvis consistent with renal pelvic calculi. I then passed a guidewire through the ureteral catheter, and remove the guidewire. The scope was removed. I then placed, after dilating with the inner core, a ureteral access sheath up to the right UPJ. The digital ureteroscope was then advanced into the access sheath and up into the renal pelvis. The tube renal  pelvic stones were seen, and were fragmented into multiple tiny fragments with the holmium laser, using a 200  laser fiber. These fragments were quite small, and actually were too small to be easily grasped by the Nitinol basket. I then passed the ureteroscope into the lower pole, where the previously mentioned stone was seen. This was also fragmented into multiple small pieces. Because I felt like there was adequate lithotripsy the small fragments, and the fact that there was some blood in the renal pelvis at this time from manipulation making visualization  somewhat difficult, I felt it best at this point to place a double-J stent. The access sheath was removed, and a 24 cm x 6 French contour stent was placed without tether. Good proximal and distal curls were seen following removal of the guidewire. The bladder was then drained. The scope was removed and the procedure terminated. The patient was awakened and taken to PACU in stable condition.

## 2012-01-26 NOTE — Assessment & Plan Note (Signed)
Well controlled. Continue current medication.  

## 2012-02-12 ENCOUNTER — Other Ambulatory Visit: Payer: Self-pay

## 2012-02-12 ENCOUNTER — Telehealth: Payer: Self-pay | Admitting: Family Medicine

## 2012-02-12 ENCOUNTER — Encounter: Payer: Self-pay | Admitting: Family Medicine

## 2012-02-12 ENCOUNTER — Ambulatory Visit (INDEPENDENT_AMBULATORY_CARE_PROVIDER_SITE_OTHER): Payer: Medicare Other | Admitting: Family Medicine

## 2012-02-12 ENCOUNTER — Ambulatory Visit: Payer: Medicare Other | Admitting: Family Medicine

## 2012-02-12 VITALS — BP 110/78 | HR 81 | Temp 97.5°F | Ht 69.5 in | Wt 208.5 lb

## 2012-02-12 DIAGNOSIS — H539 Unspecified visual disturbance: Secondary | ICD-10-CM

## 2012-02-12 DIAGNOSIS — I10 Essential (primary) hypertension: Secondary | ICD-10-CM

## 2012-02-12 DIAGNOSIS — R42 Dizziness and giddiness: Secondary | ICD-10-CM | POA: Insufficient documentation

## 2012-02-12 MED ORDER — LISINOPRIL 20 MG PO TABS: 20.0000 mg | ORAL_TABLET | Freq: Every day | ORAL | Status: DC

## 2012-02-12 NOTE — Telephone Encounter (Signed)
Caller: Jack Norris/Patient; PCP: Kerby Nora E.; CB#: [phone]; Call regarding having some problems with vision in R eye-having distortion and floaters-onset 02/11/11.  Took Benecar HCTZ in the past and had to go off med d/t lightheadedness and weight loss. Wondering if Lisinopril HCTZ is causing eye problem.  BP=121/79 last night. Recent surgery to removed Kidney Stones on 01/13/12-stent removed ~ 01/16/12. He was outside in the heat yesterday for a couple hours and then washed car. Still recovering from procedure and staminia not back to normal. Has appnt with Optometrist on 02/13/12. Triage and Care advice per Eye Vision Change Protocol and appnt advised within 4 hours for "sudden appearance of many floaters, halos, spots, specks lines or flashes". Scheduled for today -02/12/12 @ 1430 with Dr. Dayton Martes.

## 2012-02-12 NOTE — Assessment & Plan Note (Signed)
Decrease BP med to lisinopril alone. Follow BPs closely.

## 2012-02-12 NOTE — Progress Notes (Signed)
  Subjective:    Patient ID: Jack Norris, male    DOB: Jan 08, 1941, 71 y.o.   MRN: [id number]  HPI  71 year old male.. Noted floaters in right eye yesterday. He has noted distorted arch of vision in right medial eye at the same time. BP has been running 121/78.  Occ lightheaded with standing. He has lost weight and wonders if BP running too low.  No eye redness, no headache. No neurologic changes.  During appt .Marland Kitchen Distorted area has progressed to lateral lower area of vision.  Family history of carotid stenosis.  Review of Systems  Constitutional: Negative for fatigue.  HENT: Negative for neck pain.   Eyes: Negative for pain.  Cardiovascular: Negative for chest pain.  Gastrointestinal: Negative for abdominal pain.  Genitourinary: Negative for dysuria.       Objective:   Physical Exam  Constitutional: Vital signs are normal. He appears well-developed and well-nourished.  HENT:  Head: Normocephalic.  Right Ear: Hearing normal.  Left Ear: Hearing normal.  Nose: Nose normal.  Mouth/Throat: Oropharynx is clear and moist and mucous membranes are normal.  Eyes: Conjunctivae, EOM and lids are normal.  Fundoscopic exam:      The right eye shows no arteriolar narrowing, no AV nicking, no exudate, no hemorrhage and no papilledema.       The left eye shows no arteriolar narrowing, no AV nicking, no exudate, no hemorrhage and no papilledema.  Neck: Trachea normal. Carotid bruit is not present. No mass and no thyromegaly present.  Cardiovascular: Normal rate, regular rhythm and normal pulses.  Exam reveals no gallop, no distant heart sounds and no friction rub.   No murmur heard.      No peripheral edema No bruit B  Pulmonary/Chest: Effort normal and breath sounds normal. No respiratory distress.  Skin: Skin is warm, dry and intact. No rash noted.  Psychiatric: He has a normal mood and affect. His speech is normal and behavior is normal. Thought content normal.            Assessment & Plan:

## 2012-02-12 NOTE — Assessment & Plan Note (Addendum)
Concerning for retinal detachment vs clot. Emergent referral to Optho.  Given dizziness and family history of carotid stenosis.. Will eval with carotid dopplers.. No sign of bruit on exam.

## 2012-02-12 NOTE — Telephone Encounter (Signed)
Pt wanted to verify which pharmacy lisinopril was sent to. Dr Ermalene Searing sent rx electronically to [facility].Pt verified that was correct.

## 2012-02-12 NOTE — Patient Instructions (Addendum)
Change to lisinopril alone without HCTZ.  Stop at front desk to set up carotid dopplers and optho refferall, please schedule early next week or sooner.

## 2012-02-15 ENCOUNTER — Telehealth: Payer: Self-pay | Admitting: Family Medicine

## 2012-02-15 NOTE — Telephone Encounter (Signed)
Patient saw Dr Fransico Michael last Friday and he has a Vitreous detachment. No real treatment for the problem it will eventually resolve on its own as far as the floaters are concerned. Now the patient is questioning whether or not he needs the Carotid Dopplers that you have ordered. Please advise if they are necessary or not.

## 2012-02-16 NOTE — Telephone Encounter (Signed)
Patient advised and he started new medication and feels better

## 2012-02-16 NOTE — Telephone Encounter (Signed)
Let pt know .Marland Kitchen We can postpone them for now but if dizziness does not improve with lower dose of BP med then we will reorder. He needs to let us know.

## 2012-04-26 ENCOUNTER — Telehealth: Payer: Self-pay

## 2012-04-26 ENCOUNTER — Other Ambulatory Visit: Payer: Self-pay | Admitting: Urology

## 2012-04-26 NOTE — Telephone Encounter (Signed)
MArion.. There is no other urologist group in [place] right?

## 2012-04-26 NOTE — Telephone Encounter (Signed)
Pt has been seeing [facility] 1) recommended to have kidney stone removal and 2) pt has cyst on back of kidney. Pt wants second opinion from another urologist or urology group in [place].Please advise.  Pt will wait to hear from pt care coordinator.

## 2012-04-27 NOTE — Telephone Encounter (Signed)
[facility], [place], [place] only options

## 2012-04-27 NOTE — Telephone Encounter (Signed)
[facility] is the only Urology group in [place].

## 2012-04-28 NOTE — Telephone Encounter (Signed)
Patient stated he wants to be referred to [facility] for second opinion.  He is aware that patient care coordinator will be calling with the appt.

## 2012-04-28 NOTE — Telephone Encounter (Signed)
Let pt know and we can refer him to his choice for second oipinion.

## 2012-04-28 NOTE — Telephone Encounter (Signed)
Left message asking patient to return my call.

## 2012-05-03 ENCOUNTER — Telehealth: Payer: Self-pay | Admitting: Family Medicine

## 2012-05-03 DIAGNOSIS — N200 Calculus of kidney: Secondary | ICD-10-CM

## 2012-05-03 DIAGNOSIS — N281 Cyst of kidney, acquired: Secondary | ICD-10-CM | POA: Insufficient documentation

## 2012-05-03 NOTE — Telephone Encounter (Signed)
Message copied by Excell Seltzer on Tue May 03, 2012  4:32 PM ------      Message from: Carlton Adam      Created: Mon May 02, 2012  9:13 AM       Hello Dr B, Would you please put a referral in Epic for this patients 2nd opinion Urology to [facility] for me. This way we can track the referral. Thanks, Shirlee Limerick

## 2012-05-09 ENCOUNTER — Telehealth: Payer: Self-pay

## 2012-05-09 MED ORDER — TESTOSTERONE 20.25 MG/ACT (1.62%) TD GEL: 2.0000 "application " | Freq: Every day | TRANSDERMAL | Status: DC

## 2012-05-09 NOTE — Telephone Encounter (Signed)
Pt left v/m requesting refill Androgel 1.62% to [facility]; would not let me list under meds and orders need new rx.

## 2012-05-10 ENCOUNTER — Other Ambulatory Visit: Payer: Self-pay

## 2012-05-27 ENCOUNTER — Encounter (HOSPITAL_BASED_OUTPATIENT_CLINIC_OR_DEPARTMENT_OTHER): Payer: Self-pay | Admitting: *Deleted

## 2012-05-27 NOTE — Progress Notes (Signed)
NPO AFTER MN. ARRIVES AT 0745. NEEDS ISTAT AND KUB. CURRENT EKG IN EPIC AND CHART.  

## 2012-06-01 NOTE — H&P (Signed)
Urology History and Physical Exam  CC: Right sided upper ureteral stone  HPI: This 71 year old male with a history of obstructing right renal calculi presents for right ureteroscopy with lase and extraction of persistent right upper ureteral stones. His history is as follows:  He underwent ureteroscopy, holmium laser lithotripsy of 3 renal calculi  on 01/11/2012. A stent was left in, which was removed at his followup appt on 01/29/2012. Since that time, he has passed a few small stones from the right side. Followup KUB has revealed persistent 3-4 mm stones at his right UPJ.  BPH  He was first seen in August, 2010. At that time, he presented for second opinion regarding management of BPH. He had been on Jalyn for a few months prior. Dr. Artis Flock, in [place], recommended thermotherapy. Because of the size of his prostate, as well as his symptoms, I recommended TURP. He concurred. He underwent TURP using the gyrus button in April, 2011. Prior to that, by ultrasound, prostatic volume was 55 g.   HYPOGONADISM  Prior to his presentation, he had been on androgen repletion, started by Dr. Artis Flock. He continues on that, and is on AndroGel daily. His testosterone levels have been physiologic.  PSA, ELEVATED  Prior to his presentation here, in May of 2010 he underwent ultrasound and biopsy of the prostate by Dr. Artis Flock. By history, his PSA was approximately 5. Glandular size was 55 g, biopsies were negative. His PSAs have been appropriately low, with the last PSA checked in July of 2012 being 1.2.  GROSS HEMATURIA  The patient noted dark colored urine. He underwent CT of the abdomen and pelvis in August, 2012 which revealed a 7 mm right lower pole stone as well as a 1 mm left renal stone. It also revealed a 7 cm simple right renal cyst.     PMH: Past Medical History  Diagnosis Date  . Arthritis   . Hypertension   . Eczema   . BPH (benign prostatic hypertrophy)   . Asthma, mild   . Reflux  OCCASIONAL-  WATCHES DIET  . History of kidney stones   . Simple renal cyst right interpoler, simpler in nature , asymptomatic  per urologist note (dr Rafael Salway)  12-26-2011    BENIGN  . Renal calculi right     AND LEFT X1 NON-OBSTRUCTIVE    PSH: Past Surgical History  Procedure Date  . Vasectomy 1970'S    GEN. ANES.  . Lithotripsy APPROX.  1998    ESWL  left kidney x 2  . Bone spur removal     BILATERAL GREAT TOE  . Rotator cuff repair 2012    LEFT SHOULDER  . Transurethral resection of prostate 11-29-2009    BPH  . Cysto/ right retrograde pyelogram/ right ureter dilatation/ right ureteroscopy/ laser lithotripsy right renal pelvis and lower pole calculi/ placement stent 01-11-2012  DR Emri Sample    RIGHT RENAL PELVIS AND LOWER POLE CALCULI    Allergies: Allergies  Allergen Reactions  . Paroxetine     REACTION: Had mild personality change on this medication and doesn't want to take this again.  . Ciprofloxacin Other (See Comments)    ABDOMINAL PAIN/ sob  . Flagyl (Metronidazole Hcl) Other (See Comments)    ABDOMINAL PAIN/ sob    Medications: No prescriptions prior to admission     Social History: History   Social History  . Marital Status: Married    Spouse Name: N/A    Number of Children: 3  .  Years of Education: N/A   Occupational History  . 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 Smoking status: Former Smoker -- 8 years    Types: Cigarettes    Quit date: 01/05/1967  . Smokeless tobacco: Never Used   Comment: QUIT AGE 47  . Alcohol Use: Yes     OCCASIONAL  . Drug Use: No  . Sexually Active: Not on file   Other Topics Concern  . Not on file   Social History Narrative  . No narrative on file    Family History: Family History  Problem Relation Age of Onset  . Cancer Mother     breast and kidney   . Cancer Father     lung  . Heart disease Father     Review of Systems: Positive: Intermittent  hematuria. Negative:   A further 10 point review of systems was negative except what is listed in the HPI.  Physical Exam: @VITALS2 @ General: No acute distress.  Awake. Head:  Normocephalic.  Atraumatic. ENT:  EOMI.  Mucous membranes moist Neck:  Supple.  No lymphadenopathy. CV:  S1 present. S2 present. Regular rate. Pulmonary: Equal effort bilaterally.  Clear to auscultation bilaterally. Abdomen: Soft.  Non tender to palpation. Skin:  Normal turgor.  No visible rash. Extremity: No gross deformity of bilateral upper extremities.  No gross deformity of    bilateral lower extremities. Neurologic: Alert. Appropriate mood.   Studies:  No results found for this basename: HGB:2,WBC:2,PLT:2 in the last 72 hours  No results found for this basename: NA:2,K:2,CL:2,CO2:2,BUN:2,CREATININE:2,CALCIUM:2,MAGNESIUM:2,GFRNONAA:2,GFRAA:2 in the last 72 hours   No results found for this basename: PT:2,INR:2,APTT:2 in the last 72 hours   No components found with this basename: ABG:2    Assessment:  Right upper ureteral stones  Plan: Right ureteroscopic stone extraction.

## 2012-06-02 ENCOUNTER — Encounter (HOSPITAL_BASED_OUTPATIENT_CLINIC_OR_DEPARTMENT_OTHER)
Admission: RE | Disposition: A | Discharge status: Home / Self Care | Payer: Self-pay | Source: Ambulatory Visit | Attending: Urology

## 2012-06-02 ENCOUNTER — Ambulatory Visit (HOSPITAL_BASED_OUTPATIENT_CLINIC_OR_DEPARTMENT_OTHER): Payer: Medicare Other | Admitting: Anesthesiology

## 2012-06-02 ENCOUNTER — Ambulatory Visit (HOSPITAL_COMMUNITY): Payer: Medicare Other

## 2012-06-02 ENCOUNTER — Encounter (HOSPITAL_BASED_OUTPATIENT_CLINIC_OR_DEPARTMENT_OTHER): Payer: Self-pay | Admitting: *Deleted

## 2012-06-02 ENCOUNTER — Encounter (HOSPITAL_BASED_OUTPATIENT_CLINIC_OR_DEPARTMENT_OTHER): Payer: Self-pay | Admitting: Anesthesiology

## 2012-06-02 ENCOUNTER — Ambulatory Visit (HOSPITAL_BASED_OUTPATIENT_CLINIC_OR_DEPARTMENT_OTHER)
Admission: RE | Admit: 2012-06-02 | Discharge: 2012-06-02 | Disposition: A | Discharge status: Home / Self Care | Payer: Medicare Other | Source: Ambulatory Visit | Attending: Urology | Admitting: Urology

## 2012-06-02 DIAGNOSIS — Q619 Cystic kidney disease, unspecified: Secondary | ICD-10-CM | POA: Insufficient documentation

## 2012-06-02 DIAGNOSIS — I10 Essential (primary) hypertension: Secondary | ICD-10-CM | POA: Insufficient documentation

## 2012-06-02 DIAGNOSIS — N400 Enlarged prostate without lower urinary tract symptoms: Secondary | ICD-10-CM | POA: Insufficient documentation

## 2012-06-02 DIAGNOSIS — N201 Calculus of ureter: Secondary | ICD-10-CM | POA: Insufficient documentation

## 2012-06-02 DIAGNOSIS — R972 Elevated prostate specific antigen [PSA]: Secondary | ICD-10-CM | POA: Insufficient documentation

## 2012-06-02 DIAGNOSIS — E291 Testicular hypofunction: Secondary | ICD-10-CM | POA: Insufficient documentation

## 2012-06-02 DIAGNOSIS — F430 Acute stress reaction: Secondary | ICD-10-CM | POA: Insufficient documentation

## 2012-06-02 HISTORY — PX: CYSTOSCOPY WITH URETEROSCOPY: SHX5123

## 2012-06-02 HISTORY — PX: CYSTOSCOPY W/ RETROGRADES: SHX1426

## 2012-06-02 LAB — POCT I-STAT, CHEM 8
Calcium, Ion: 1.27 mmol/L (ref 1.13–1.30)
Chloride: 108 mEq/L (ref 96–112)
Glucose, Bld: 93 mg/dL (ref 70–99)
HCT: 42 % (ref 39.0–52.0)
Hemoglobin: 14.3 g/dL (ref 13.0–17.0)
TCO2: 22 mmol/L (ref 0–100)

## 2012-06-02 SURGERY — CYSTOSCOPY WITH URETEROSCOPY
Anesthesia: General | Site: Ureter | Laterality: Right | Wound class: Clean Contaminated

## 2012-06-02 MED ORDER — ACETAMINOPHEN 650 MG RE SUPP: 650.0000 mg | RECTAL | Status: DC | PRN

## 2012-06-02 MED ORDER — CEPHALEXIN 250 MG PO CAPS: ORAL_CAPSULE | ORAL | Status: DC

## 2012-06-02 MED ORDER — MEPERIDINE HCL 25 MG/ML IJ SOLN: 6.2500 mg | INTRAMUSCULAR | Status: DC | PRN

## 2012-06-02 MED ORDER — CEFAZOLIN SODIUM 1-5 GM-% IV SOLN: 1.0000 g | INTRAVENOUS | Status: DC

## 2012-06-02 MED ORDER — SODIUM CHLORIDE 0.9 % IJ SOLN: 3.0000 mL | INTRAMUSCULAR | Status: DC | PRN

## 2012-06-02 MED ORDER — BELLADONNA ALKALOIDS-OPIUM 16.2-60 MG RE SUPP
RECTAL | Status: DC | PRN
  Administered 2012-06-02: 1 via RECTAL

## 2012-06-02 MED ORDER — CEFAZOLIN SODIUM-DEXTROSE 2-3 GM-% IV SOLR: 2.0000 g | INTRAVENOUS | Status: DC

## 2012-06-02 MED ORDER — ONDANSETRON HCL 4 MG/2ML IJ SOLN
INTRAMUSCULAR | Status: DC | PRN
  Administered 2012-06-02: 4 mg via INTRAVENOUS

## 2012-06-02 MED ORDER — FENTANYL CITRATE 0.05 MG/ML IJ SOLN
INTRAMUSCULAR | Status: DC | PRN
  Administered 2012-06-02: 25 ug via INTRAVENOUS
  Administered 2012-06-02: 100 ug via INTRAVENOUS
  Administered 2012-06-02 (×3): 25 ug via INTRAVENOUS

## 2012-06-02 MED ORDER — DEXAMETHASONE SODIUM PHOSPHATE 4 MG/ML IJ SOLN
INTRAMUSCULAR | Status: DC | PRN
  Administered 2012-06-02: 10 mg via INTRAVENOUS

## 2012-06-02 MED ORDER — SODIUM CHLORIDE 0.9 % IV SOLN: 250.0000 mL | INTRAVENOUS | Status: DC | PRN

## 2012-06-02 MED ORDER — CEFAZOLIN SODIUM-DEXTROSE 2-3 GM-% IV SOLR
INTRAVENOUS | Status: DC | PRN
  Administered 2012-06-02: 2 g via INTRAVENOUS

## 2012-06-02 MED ORDER — KETOROLAC TROMETHAMINE 30 MG/ML IJ SOLN
INTRAMUSCULAR | Status: DC | PRN
  Administered 2012-06-02: 30 mg via INTRAVENOUS

## 2012-06-02 MED ORDER — SODIUM CHLORIDE 0.9 % IR SOLN
Status: DC | PRN
  Administered 2012-06-02: 60000 mL

## 2012-06-02 MED ORDER — PROPOFOL 10 MG/ML IV BOLUS
INTRAVENOUS | Status: DC | PRN
  Administered 2012-06-02: 200 mg via INTRAVENOUS

## 2012-06-02 MED ORDER — PROMETHAZINE HCL 25 MG PO TABS
25.0000 mg | ORAL_TABLET | Freq: Four times a day (QID) | ORAL | Status: DC | PRN
  Administered 2012-06-02: 25 mg via ORAL

## 2012-06-02 MED ORDER — FENTANYL CITRATE 0.05 MG/ML IJ SOLN: 25.0000 ug | INTRAMUSCULAR | Status: DC | PRN

## 2012-06-02 MED ORDER — LACTATED RINGERS IV SOLN
INTRAVENOUS | Status: DC
  Administered 2012-06-02 (×2): via INTRAVENOUS

## 2012-06-02 MED ORDER — MORPHINE SULFATE 2 MG/ML IJ SOLN: 2.0000 mg | INTRAMUSCULAR | Status: DC | PRN

## 2012-06-02 MED ORDER — EPHEDRINE SULFATE 50 MG/ML IJ SOLN
INTRAMUSCULAR | Status: DC | PRN
  Administered 2012-06-02 (×2): 10 mg via INTRAVENOUS

## 2012-06-02 MED ORDER — IOHEXOL 350 MG/ML SOLN
INTRAVENOUS | Status: DC | PRN
  Administered 2012-06-02: 6 mL

## 2012-06-02 MED ORDER — SODIUM CHLORIDE 0.9 % IJ SOLN: 3.0000 mL | Freq: Two times a day (BID) | INTRAMUSCULAR | Status: DC

## 2012-06-02 MED ORDER — ONDANSETRON HCL 4 MG/2ML IJ SOLN: 4.0000 mg | Freq: Four times a day (QID) | INTRAMUSCULAR | Status: DC | PRN

## 2012-06-02 MED ORDER — LACTATED RINGERS IV SOLN
INTRAVENOUS | Status: DC | PRN
  Administered 2012-06-02 (×2): via INTRAVENOUS

## 2012-06-02 MED ORDER — PROMETHAZINE HCL 25 MG/ML IJ SOLN: 6.2500 mg | INTRAMUSCULAR | Status: DC | PRN

## 2012-06-02 MED ORDER — LIDOCAINE HCL (CARDIAC) 20 MG/ML IV SOLN
INTRAVENOUS | Status: DC | PRN
  Administered 2012-06-02: 100 mg via INTRAVENOUS

## 2012-06-02 MED ORDER — MIDAZOLAM HCL 5 MG/5ML IJ SOLN
INTRAMUSCULAR | Status: DC | PRN
  Administered 2012-06-02: 2 mg via INTRAVENOUS

## 2012-06-02 MED ORDER — ACETAMINOPHEN 325 MG PO TABS: 650.0000 mg | ORAL_TABLET | ORAL | Status: DC | PRN

## 2012-06-02 MED ORDER — LACTATED RINGERS IV SOLN: INTRAVENOUS | Status: DC

## 2012-06-02 MED ORDER — OXYCODONE HCL 5 MG PO TABS
5.0000 mg | ORAL_TABLET | ORAL | Status: DC | PRN
  Administered 2012-06-02: 5 mg via ORAL

## 2012-06-02 SURGICAL SUPPLY — 22 items
ADAPTER CATH URET PLST 4-6FR (CATHETERS) ×2 IMPLANT
ADPR CATH URET STRL DISP 4-6FR (CATHETERS) ×2
BAG DRAIN URO-CYSTO SKYTR STRL (DRAIN) ×3 IMPLANT
BAG DRN UROCATH (DRAIN) ×2
BASKET ZERO TIP NITINOL 2.4FR (BASKET) ×2 IMPLANT
BSKT STON RTRVL ZERO TP 2.4FR (BASKET) ×2
CANISTER SUCT LVC 12 LTR MEDI- (MISCELLANEOUS) ×2 IMPLANT
CATH INTERMIT  6FR 70CM (CATHETERS) ×2 IMPLANT
CLOTH BEACON ORANGE TIMEOUT ST (SAFETY) ×3 IMPLANT
DRAPE CAMERA CLOSED 9X96 (DRAPES) ×3 IMPLANT
GLOVE BIO SURGEON STRL SZ8 (GLOVE) ×3 IMPLANT
GLOVE BIOGEL PI IND STRL 6.5 (GLOVE) ×2 IMPLANT
GLOVE BIOGEL PI INDICATOR 6.5 (GLOVE) ×2
GOWN STRL NON-REIN LRG LVL3 (GOWN DISPOSABLE) ×2 IMPLANT
GOWN STRL REIN XL XLG (GOWN DISPOSABLE) ×3 IMPLANT
GUIDEWIRE 0.038 PTFE COATED (WIRE) IMPLANT
GUIDEWIRE ANG ZIPWIRE 038X150 (WIRE) IMPLANT
GUIDEWIRE STR DUAL SENSOR (WIRE) ×2 IMPLANT
IV NS IRRIG 3000ML ARTHROMATIC (IV SOLUTION) ×5 IMPLANT
NS IRRIG 500ML POUR BTL (IV SOLUTION) ×2 IMPLANT
PACK CYSTOSCOPY (CUSTOM PROCEDURE TRAY) ×3 IMPLANT
SHEATH ACCESS URETERAL 54CM (SHEATH) ×2 IMPLANT

## 2012-06-02 NOTE — Anesthesia Postprocedure Evaluation (Signed)
  Anesthesia Post-op Note  Patient: Jack Norris  Procedure(s) Performed: Procedure(s) (LRB): CYSTOSCOPY WITH URETEROSCOPY (Right) CYSTOSCOPY WITH RETROGRADE PYELOGRAM (Right)  Patient Location: PACU  Anesthesia Type: General  Level of Consciousness: awake and alert   Airway and Oxygen Therapy: Patient Spontanous Breathing  Post-op Pain: mild  Post-op Assessment: Post-op Vital signs reviewed, Patient's Cardiovascular Status Stable, Respiratory Function Stable, Patent Airway and No signs of Nausea or vomiting  Post-op Vital Signs: stable  Complications: No apparent anesthesia complications

## 2012-06-02 NOTE — Anesthesia Preprocedure Evaluation (Addendum)
Anesthesia Evaluation  Patient identified by MRN, date of birth, ID band Patient awake    Reviewed: Allergy & Precautions, H&P , NPO status , Patient's Chart, lab work & pertinent test results  Airway Mallampati: II TM Distance: >3 FB Neck ROM: Full    Dental No notable dental hx. (+)    Pulmonary asthma ,  breath sounds clear to auscultation  Pulmonary exam normal       Cardiovascular Exercise Tolerance: Good hypertension, Pt. on medications Rhythm:Regular Rate:Normal  ECG: SR LAD   Neuro/Psych negative neurological ROS  negative psych ROS   GI/Hepatic Neg liver ROS, GERD-  Controlled,  Endo/Other  negative endocrine ROS  Renal/GU negative Renal ROS  negative genitourinary   Musculoskeletal negative musculoskeletal ROS (+)   Abdominal (+) + obese,   Peds negative pediatric ROS (+)  Hematology negative hematology ROS (+)   Anesthesia Other Findings   Reproductive/Obstetrics negative OB ROS                          Anesthesia Physical  Anesthesia Plan  ASA: II  Anesthesia Plan: General   Post-op Pain Management:    Induction: Intravenous  Airway Management Planned: LMA  Additional Equipment:   Intra-op Plan:   Post-operative Plan: Extubation in OR  Informed Consent: I have reviewed the patients History and Physical, chart, labs and discussed the procedure including the risks, benefits and alternatives for the proposed anesthesia with the patient or authorized representative who has indicated his/her understanding and acceptance.   Dental advisory given  Plan Discussed with: CRNA  Anesthesia Plan Comments:         Anesthesia Quick Evaluation

## 2012-06-02 NOTE — Op Note (Signed)
Preoperative diagnosis: Right ureteropelvic junction stone  Postoperative diagnosis: Right ureter and pelvic junction and right ureteral stones   Procedure: Cystoscopy, right retrograde ureteropyelogram with interpretation of fluoroscopy, right ureteroscopic stone extractions    Surgeon: Bertram Millard. Chyrl Elwell, M.D.   Anesthesia: Gen.   Complications: None  Specimen(s): Stones, the patient's wife    Drain(s): None   Indications: 71 year-old male with persistent ureteral calculi after prior procedure earlier this year. He is relatively asymptomatic but has 4-5 stones that have caused some intermittent pain and hematuria. He presents for ureteroscopic stone extraction with possible use of laser. Risks and complications have been discussed with the patient. He understands these and desires to proceed.    Technique and findings: the patient was properly identified and marked in the holding area. He was given preoperative IV antibiotics previous taken the operating room where general anesthetic was administered with the LMA. He is placed in the dorsolithotomy position. Genitalia and perineum were prepped and draped. Timeout was then performed.  A 22 French panendoscope was then passed to the patient's urethra, which was found to be normal. Prostate was indicative of prior TURP, without evidence of obstruction. His bladder was entered and inspected circumferentially. Mild trabeculations were noted, no urothelial lesions were noted. His ureteral orifices were normal in configuration and location. The right ureteral orifice was then cannulated with a 6 [place] open-ended catheter. A retrograde pyelogram was then performed.  The retrograde pyelogram revealed normal ureteral caliber throughout, with 2 small filling defects in the mid and more proximal ureter. There were no filling defects seen at the patient's ureteropelvic junction. There was mild pyelocaliectasis. I did not see any specific filling  defects within the renal pelvis. I then removed the ureteral catheter after I passed a guidewire through this all the way to the renal pelvis. The cystoscope was then removed, leaving the guidewire indwelling. I then dilated the distal and mid ureter with the inner core of a 55 cm digital ureteral access sheath. I then passed the rigid ureteroscope to the bladder and up to the first stone which was grasped with the Nitinol basket and removed. A second stone was encountered in the ureter slightly higher up and this was removed with the Nitinol basket as well. I then passed the scope more proximally up to the UPJ. No further stones or lesions were seen. I removed the rigid scope, and then placed the ureteral access sheath all the way up into the renal pelvis using the inner core/guide. The inner core in the wire was then removed. I then passed a digital ureteroscope up into the renal pelvis. All calyces were inspected. No urothelial lesions were noted. 4 separate calculi, each 3-4 mm in size, were identified, grasped, and removed through the ureteral access sheath. This may the total number of stones extracted 6. After a thorough inspection of all the calyces a couple of times, and seeing no further stones, I then removed the ureteroscope. The access sheath was removed, and the bladder drained. I did not leave a stent.  The patient tolerated procedure well. He was awakened and then taken to the PACU in stable condition.

## 2012-06-02 NOTE — Anesthesia Procedure Notes (Signed)
Procedure Name: LMA Insertion Date/Time: 06/02/2012 9:38 AM Performed by: Jessica Priest Pre-anesthesia Checklist: Patient identified, Emergency Drugs available, Suction available and Patient being monitored Patient Re-evaluated:Patient Re-evaluated prior to inductionOxygen Delivery Method: Circle System Utilized Preoxygenation: Pre-oxygenation with 100% oxygen Intubation Type: IV induction Ventilation: Mask ventilation without difficulty LMA: LMA inserted LMA Size: 5.0 Number of attempts: 1 Airway Equipment and Method: bite block Placement Confirmation: positive ETCO2 Tube secured with: Tape Dental Injury: Teeth and Oropharynx as per pre-operative assessment

## 2012-06-02 NOTE — Interval H&P Note (Signed)
History and Physical Interval Note:  06/02/2012 9:26 AM  Jack Norris Severe  has presented today for surgery, with the diagnosis of RIGHT RENAL CALCULI  The various methods of treatment have been discussed with the patient and family. After consideration of risks, benefits and other options for treatment, the patient has consented to  Procedure(s) (LRB) with comments: CYSTOSCOPY WITH URETEROSCOPY (Right) - 90 MIN ALSO EXTRACTION OF CALCULI  HOLMIUM LASER APPLICATION (Right) CYSTOSCOPY WITH STENT PLACEMENT (Right) as a surgical intervention .  The patient's history has been reviewed, patient examined, no change in status, stable for surgery.  I have reviewed the patient's chart and labs.  Questions were answered to the patient's satisfaction.     [name]

## 2012-06-02 NOTE — Transfer of Care (Signed)
Immediate Anesthesia Transfer of Care Note  Patient: Jack Norris  Procedure(s) Performed: Procedure(s) (LRB): CYSTOSCOPY WITH URETEROSCOPY (Right) CYSTOSCOPY WITH RETROGRADE PYELOGRAM (Right)  Patient Location: PACU  Anesthesia Type: General  Level of Consciousness: awake, sedated, patient cooperative and responds to stimulation  Airway & Oxygen Therapy: Patient Spontanous Breathing and Patient connected to face mask oxygen  Post-op Assessment: Report given to PACU RN, Post -op Vital signs reviewed and stable and Patient moving all extremities  Post vital signs: Reviewed and stable  Complications: No apparent anesthesia complications

## 2012-06-02 NOTE — H&P (View-Only) (Signed)
NPO AFTER MN. ARRIVES AT 0745. NEEDS ISTAT AND KUB. CURRENT EKG IN EPIC AND CHART.

## 2012-06-03 ENCOUNTER — Encounter (HOSPITAL_BASED_OUTPATIENT_CLINIC_OR_DEPARTMENT_OTHER): Payer: Self-pay | Admitting: Urology

## 2012-07-01 ENCOUNTER — Telehealth: Payer: Self-pay | Admitting: Family Medicine

## 2012-07-01 DIAGNOSIS — E7800 Pure hypercholesterolemia, unspecified: Secondary | ICD-10-CM

## 2012-07-01 DIAGNOSIS — E291 Testicular hypofunction: Secondary | ICD-10-CM

## 2012-07-01 DIAGNOSIS — E781 Pure hyperglyceridemia: Secondary | ICD-10-CM

## 2012-07-01 DIAGNOSIS — I10 Essential (primary) hypertension: Secondary | ICD-10-CM

## 2012-07-01 NOTE — Telephone Encounter (Signed)
Message copied by Excell Seltzer on Fri Jul 01, 2012 10:33 AM ------      Message from: Baldomero Lamy      Created: Fri Jun 24, 2012 10:34 AM      Regarding: 6 mo f/u labs Mon 11/11       Please order  future f/u labs for pt's upcomming lab appt.      Thanks      Rodney Booze

## 2012-07-04 ENCOUNTER — Other Ambulatory Visit (INDEPENDENT_AMBULATORY_CARE_PROVIDER_SITE_OTHER): Payer: Medicare Other

## 2012-07-04 DIAGNOSIS — E781 Pure hyperglyceridemia: Secondary | ICD-10-CM

## 2012-07-04 LAB — COMPREHENSIVE METABOLIC PANEL
AST: 16 U/L (ref 0–37)
Albumin: 4.2 g/dL (ref 3.5–5.2)
Alkaline Phosphatase: 94 U/L (ref 39–117)
BUN: 16 mg/dL (ref 6–23)
Creatinine, Ser: 1.1 mg/dL (ref 0.4–1.5)
Glucose, Bld: 99 mg/dL (ref 70–99)
Potassium: 4.5 mEq/L (ref 3.5–5.1)
Total Bilirubin: 0.6 mg/dL (ref 0.3–1.2)

## 2012-07-04 LAB — LIPID PANEL
Cholesterol: 185 mg/dL (ref 0–200)
HDL: 37.8 mg/dL — ABNORMAL LOW (ref >39.00)
LDL Cholesterol: 123 mg/dL — ABNORMAL HIGH (ref 0–99)
Triglycerides: 120 mg/dL (ref 0.0–149.0)

## 2012-07-11 ENCOUNTER — Ambulatory Visit (INDEPENDENT_AMBULATORY_CARE_PROVIDER_SITE_OTHER): Payer: Medicare Other | Admitting: Family Medicine

## 2012-07-11 ENCOUNTER — Encounter: Payer: Self-pay | Admitting: Family Medicine

## 2012-07-11 VITALS — BP 124/82 | HR 88 | Temp 98.5°F | Ht 69.5 in | Wt 214.8 lb

## 2012-07-11 DIAGNOSIS — E7800 Pure hypercholesterolemia, unspecified: Secondary | ICD-10-CM

## 2012-07-11 DIAGNOSIS — I10 Essential (primary) hypertension: Secondary | ICD-10-CM

## 2012-07-11 DIAGNOSIS — Z23 Encounter for immunization: Secondary | ICD-10-CM

## 2012-07-11 DIAGNOSIS — E781 Pure hyperglyceridemia: Secondary | ICD-10-CM

## 2012-07-11 NOTE — Assessment & Plan Note (Signed)
Well controlled. Continue current medication.  

## 2012-07-11 NOTE — Assessment & Plan Note (Signed)
Well controlled on gemfibrozil.

## 2012-07-11 NOTE — Assessment & Plan Note (Signed)
LDL at goal <130. HDl lower than goal.. Increase exercise continue niacin.

## 2012-07-11 NOTE — Patient Instructions (Addendum)
Work on exercise, weight loss, healthy eating habits.  

## 2012-07-11 NOTE — Progress Notes (Signed)
  Subjective:    Patient ID: Jack Norris, male    DOB: December 08, 1940, 72 y.o.   MRN: [id number]  HPI 71 year old male presents for 6 month follow up.  Elevated Cholesterol: Worsened control but at goal on gemfibrozil, niacin  Lab Results  Component Value Date   CHOL 185 07/04/2012   HDL 37.80* 07/04/2012   LDLCALC 123* 07/04/2012   LDLDIRECT 107.6 12/14/2011   TRIG 120.0 07/04/2012   CHOLHDL 5 07/04/2012  Using medications without problems:None  Diet compliance: Good  Exercise: walking 1-2 times a week  Wt Readings from Last 3 Encounters:  07/11/12 214 lb 12 oz (97.41 kg)  06/02/12 210 lb 5 oz (95.397 kg)  06/02/12 210 lb 5 oz (95.397 kg)  Other complaints:   Hypertension: Well controlled on lisinopril now without HCTZ. Using medication without problems or lightheadedness: None  Off HCTZ. Chest pain with exertion:None  Edema:None  Short of breath:None  Average home BPs: 120/70 Other issues:   Dr Vernice Jefferson.Marland KitchenMarland KitchenHad transurethral stone removal  06/02/2012. Tolerated well.  Dr. Holley Bouche: Detached vitreous stabalized.    Review of Systems  Constitutional: Negative for fever and fatigue.  HENT: Negative for ear pain.   Respiratory: Negative for cough and shortness of breath.   Cardiovascular: Negative for chest pain, palpitations and leg swelling.  Gastrointestinal: Negative for abdominal pain.       Objective:   Physical Exam  Constitutional: Vital signs are normal. He appears well-developed and well-nourished.  HENT:  Head: Normocephalic.  Right Ear: Hearing normal.  Left Ear: Hearing normal.  Nose: Nose normal.  Mouth/Throat: Oropharynx is clear and moist and mucous membranes are normal.  Neck: Trachea normal. Carotid bruit is not present. No mass and no thyromegaly present.  Cardiovascular: Normal rate, regular rhythm and normal pulses.  Exam reveals no gallop, no distant heart sounds and no friction rub.   No murmur heard.      No peripheral edema    Pulmonary/Chest: Effort normal and breath sounds normal. No respiratory distress.  Skin: Skin is warm, dry and intact. No rash noted.  Psychiatric: He has a normal mood and affect. His speech is normal and behavior is normal. Thought content normal.          Assessment & Plan:

## 2012-09-08 ENCOUNTER — Encounter (HOSPITAL_BASED_OUTPATIENT_CLINIC_OR_DEPARTMENT_OTHER): Payer: Self-pay | Admitting: *Deleted

## 2012-09-08 ENCOUNTER — Encounter (HOSPITAL_BASED_OUTPATIENT_CLINIC_OR_DEPARTMENT_OTHER)
Admission: RE | Admit: 2012-09-08 | Discharge: 2012-09-08 | Disposition: A | Discharge status: Home / Self Care | Payer: Medicare Other | Source: Ambulatory Visit | Attending: Orthopedic Surgery | Admitting: Orthopedic Surgery

## 2012-09-08 LAB — BASIC METABOLIC PANEL
BUN: 17 mg/dL (ref 6–23)
CO2: 24 mEq/L (ref 19–32)
Calcium: 9.6 mg/dL (ref 8.4–10.5)
Chloride: 103 mEq/L (ref 96–112)
Creatinine, Ser: 1.03 mg/dL (ref 0.50–1.35)
GFR calc Af Amer: 82 mL/min — ABNORMAL LOW (ref >90)

## 2012-09-08 NOTE — Progress Notes (Signed)
To come in for bmet Will go ahead and fix overnight bag just in case he needs to stay-is planning to go home post op No cardiac or resp now

## 2012-09-09 NOTE — Progress Notes (Signed)
09/09/12 0932  OBSTRUCTIVE SLEEP APNEA  Score 4 or greater  Results sent to PCP    

## 2012-09-09 NOTE — Progress Notes (Signed)
09/09/12 0932  OBSTRUCTIVE SLEEP APNEA  Score 4 or greater  Results sent to PCP

## 2012-09-12 ENCOUNTER — Encounter (HOSPITAL_BASED_OUTPATIENT_CLINIC_OR_DEPARTMENT_OTHER): Payer: Self-pay | Admitting: Anesthesiology

## 2012-09-12 ENCOUNTER — Encounter (HOSPITAL_BASED_OUTPATIENT_CLINIC_OR_DEPARTMENT_OTHER)
Admission: RE | Disposition: A | Discharge status: Home / Self Care | Payer: Self-pay | Source: Ambulatory Visit | Attending: Orthopedic Surgery

## 2012-09-12 ENCOUNTER — Ambulatory Visit (HOSPITAL_BASED_OUTPATIENT_CLINIC_OR_DEPARTMENT_OTHER): Payer: Medicare Other | Admitting: Anesthesiology

## 2012-09-12 ENCOUNTER — Other Ambulatory Visit: Payer: Self-pay

## 2012-09-12 ENCOUNTER — Encounter (HOSPITAL_BASED_OUTPATIENT_CLINIC_OR_DEPARTMENT_OTHER): Payer: Self-pay | Admitting: *Deleted

## 2012-09-12 ENCOUNTER — Ambulatory Visit (HOSPITAL_BASED_OUTPATIENT_CLINIC_OR_DEPARTMENT_OTHER)
Admission: RE | Admit: 2012-09-12 | Discharge: 2012-09-12 | Disposition: A | Discharge status: Home / Self Care | Payer: Medicare Other | Source: Ambulatory Visit | Attending: Orthopedic Surgery | Admitting: Orthopedic Surgery

## 2012-09-12 DIAGNOSIS — M66329 Spontaneous rupture of flexor tendons, unspecified upper arm: Secondary | ICD-10-CM | POA: Insufficient documentation

## 2012-09-12 DIAGNOSIS — Z79899 Other long term (current) drug therapy: Secondary | ICD-10-CM | POA: Insufficient documentation

## 2012-09-12 DIAGNOSIS — M75100 Unspecified rotator cuff tear or rupture of unspecified shoulder, not specified as traumatic: Secondary | ICD-10-CM

## 2012-09-12 DIAGNOSIS — Z7982 Long term (current) use of aspirin: Secondary | ICD-10-CM | POA: Insufficient documentation

## 2012-09-12 DIAGNOSIS — J45909 Unspecified asthma, uncomplicated: Secondary | ICD-10-CM | POA: Insufficient documentation

## 2012-09-12 DIAGNOSIS — Z01812 Encounter for preprocedural laboratory examination: Secondary | ICD-10-CM | POA: Insufficient documentation

## 2012-09-12 DIAGNOSIS — M25819 Other specified joint disorders, unspecified shoulder: Secondary | ICD-10-CM | POA: Insufficient documentation

## 2012-09-12 DIAGNOSIS — I10 Essential (primary) hypertension: Secondary | ICD-10-CM | POA: Insufficient documentation

## 2012-09-12 DIAGNOSIS — M67919 Unspecified disorder of synovium and tendon, unspecified shoulder: Secondary | ICD-10-CM | POA: Insufficient documentation

## 2012-09-12 DIAGNOSIS — M24119 Other articular cartilage disorders, unspecified shoulder: Secondary | ICD-10-CM | POA: Insufficient documentation

## 2012-09-12 DIAGNOSIS — M719 Bursopathy, unspecified: Secondary | ICD-10-CM | POA: Insufficient documentation

## 2012-09-12 HISTORY — DX: Presence of spectacles and contact lenses: Z97.3

## 2012-09-12 HISTORY — PX: SHOULDER ARTHROSCOPY WITH ROTATOR CUFF REPAIR AND SUBACROMIAL DECOMPRESSION: SHX5686

## 2012-09-12 LAB — POCT HEMOGLOBIN-HEMACUE: Hemoglobin: 15.3 g/dL (ref 13.0–17.0)

## 2012-09-12 SURGERY — SHOULDER ARTHROSCOPY WITH ROTATOR CUFF REPAIR AND SUBACROMIAL DECOMPRESSION
Anesthesia: General | Site: Shoulder | Laterality: Right | Wound class: Clean

## 2012-09-12 MED ORDER — DEXAMETHASONE SODIUM PHOSPHATE 4 MG/ML IJ SOLN
INTRAMUSCULAR | Status: DC | PRN
  Administered 2012-09-12: 4 mg
  Administered 2012-09-12: 10 mg via INTRAVENOUS

## 2012-09-12 MED ORDER — PROPOFOL 10 MG/ML IV BOLUS
INTRAVENOUS | Status: DC | PRN
  Administered 2012-09-12: 150 mg via INTRAVENOUS

## 2012-09-12 MED ORDER — OXYCODONE HCL 5 MG PO TABS: 5.0000 mg | ORAL_TABLET | Freq: Once | ORAL | Status: DC | PRN

## 2012-09-12 MED ORDER — SUCCINYLCHOLINE CHLORIDE 20 MG/ML IJ SOLN
INTRAMUSCULAR | Status: DC | PRN
  Administered 2012-09-12: 100 mg via INTRAVENOUS

## 2012-09-12 MED ORDER — BUPIVACAINE-EPINEPHRINE PF 0.5-1:200000 % IJ SOLN
INTRAMUSCULAR | Status: DC | PRN
  Administered 2012-09-12: 25 mL

## 2012-09-12 MED ORDER — MIDAZOLAM HCL 2 MG/2ML IJ SOLN
1.0000 mg | INTRAMUSCULAR | Status: DC | PRN
  Administered 2012-09-12: 2 mg via INTRAVENOUS

## 2012-09-12 MED ORDER — ONDANSETRON HCL 4 MG/2ML IJ SOLN
INTRAMUSCULAR | Status: DC | PRN
  Administered 2012-09-12: 4 mg via INTRAVENOUS

## 2012-09-12 MED ORDER — FENTANYL CITRATE 0.05 MG/ML IJ SOLN
50.0000 ug | Freq: Once | INTRAMUSCULAR | Status: AC
  Administered 2012-09-12: 100 ug via INTRAVENOUS

## 2012-09-12 MED ORDER — ACETAMINOPHEN 10 MG/ML IV SOLN
1000.0000 mg | Freq: Once | INTRAVENOUS | Status: AC
  Administered 2012-09-12: 1000 mg via INTRAVENOUS

## 2012-09-12 MED ORDER — OXYCODONE-ACETAMINOPHEN 5-325 MG PO TABS: 1.0000 | ORAL_TABLET | ORAL | Status: DC | PRN

## 2012-09-12 MED ORDER — LACTATED RINGERS IV SOLN
INTRAVENOUS | Status: DC
  Administered 2012-09-12 (×2): via INTRAVENOUS

## 2012-09-12 MED ORDER — OXYCODONE HCL 5 MG/5ML PO SOLN: 5.0000 mg | Freq: Once | ORAL | Status: DC | PRN

## 2012-09-12 MED ORDER — EPHEDRINE SULFATE 50 MG/ML IJ SOLN
INTRAMUSCULAR | Status: DC | PRN
  Administered 2012-09-12: 10 mg via INTRAVENOUS

## 2012-09-12 MED ORDER — HYDROMORPHONE HCL PF 1 MG/ML IJ SOLN: 0.2500 mg | INTRAMUSCULAR | Status: DC | PRN

## 2012-09-12 MED ORDER — GEMFIBROZIL 600 MG PO TABS: 600.0000 mg | ORAL_TABLET | Freq: Every day | ORAL | Status: DC

## 2012-09-12 MED ORDER — LIDOCAINE HCL (CARDIAC) 20 MG/ML IV SOLN
INTRAVENOUS | Status: DC | PRN
  Administered 2012-09-12: 60 mg via INTRAVENOUS

## 2012-09-12 MED ORDER — SODIUM CHLORIDE 0.9 % IR SOLN
Status: DC | PRN
  Administered 2012-09-12: 3000 mL
  Administered 2012-09-12: 6000 mL

## 2012-09-12 MED ORDER — CEFAZOLIN SODIUM 1-5 GM-% IV SOLN
1.0000 g | Freq: Once | INTRAVENOUS | Status: AC
  Administered 2012-09-12: 2 g via INTRAVENOUS

## 2012-09-12 MED ORDER — PROMETHAZINE HCL 25 MG/ML IJ SOLN: 6.2500 mg | INTRAMUSCULAR | Status: DC | PRN

## 2012-09-12 SURGICAL SUPPLY — 87 items
ADH SKN CLS APL DERMABOND .7 (GAUZE/BANDAGES/DRESSINGS)
ANCH SUT SWLK 19.1X4.75 (Anchor) ×4 IMPLANT
ANCHOR SUT BIO SW 4.75X19.1 (Anchor) ×4 IMPLANT
APL SKNCLS STERI-STRIP NONHPOA (GAUZE/BANDAGES/DRESSINGS)
BENZOIN TINCTURE PRP APPL 2/3 (GAUZE/BANDAGES/DRESSINGS) IMPLANT
BLADE SURG 15 STRL LF DISP TIS (BLADE) IMPLANT
BLADE SURG 15 STRL SS (BLADE)
BLADE SURG ROTATE 9660 (MISCELLANEOUS) IMPLANT
BUR OVAL 4.0 (BURR) ×1 IMPLANT
CANISTER OMNI JUG 16 LITER (MISCELLANEOUS) ×2 IMPLANT
CANISTER SUCTION 2500CC (MISCELLANEOUS) IMPLANT
CANNULA 5.75X71 LONG (CANNULA) ×2 IMPLANT
CANNULA TWIST IN 8.25X7CM (CANNULA) ×1 IMPLANT
CHLORAPREP W/TINT 26ML (MISCELLANEOUS) ×2 IMPLANT
CLOTH BEACON ORANGE TIMEOUT ST (SAFETY) ×2 IMPLANT
DECANTER SPIKE VIAL GLASS SM (MISCELLANEOUS) IMPLANT
DERMABOND ADVANCED (GAUZE/BANDAGES/DRESSINGS)
DERMABOND ADVANCED .7 DNX12 (GAUZE/BANDAGES/DRESSINGS) IMPLANT
DRAPE INCISE IOBAN 66X45 STRL (DRAPES) ×2 IMPLANT
DRAPE STERI 35X30 U-POUCH (DRAPES) ×2 IMPLANT
DRAPE SURG 17X23 STRL (DRAPES) ×2 IMPLANT
DRAPE U 20/CS (DRAPES) ×2 IMPLANT
DRAPE U-SHAPE 47X51 STRL (DRAPES) ×2 IMPLANT
DRAPE U-SHAPE 76X120 STRL (DRAPES) ×4 IMPLANT
DRSG PAD ABDOMINAL 8X10 ST (GAUZE/BANDAGES/DRESSINGS) ×2 IMPLANT
ELECT REM PT RETURN 9FT ADLT (ELECTROSURGICAL) ×2
ELECTRODE REM PT RTRN 9FT ADLT (ELECTROSURGICAL) ×1 IMPLANT
GAUZE SPONGE 4X4 16PLY XRAY LF (GAUZE/BANDAGES/DRESSINGS) IMPLANT
GAUZE XEROFORM 1X8 LF (GAUZE/BANDAGES/DRESSINGS) ×2 IMPLANT
GLOVE BIO SURGEON STRL SZ 6.5 (GLOVE) ×1 IMPLANT
GLOVE BIO SURGEON STRL SZ7 (GLOVE) ×2 IMPLANT
GLOVE BIO SURGEON STRL SZ7.5 (GLOVE) ×5 IMPLANT
GLOVE BIOGEL PI IND STRL 7.0 (GLOVE) ×1 IMPLANT
GLOVE BIOGEL PI IND STRL 7.5 (GLOVE) IMPLANT
GLOVE BIOGEL PI IND STRL 8 (GLOVE) ×2 IMPLANT
GLOVE BIOGEL PI INDICATOR 7.0 (GLOVE) ×2
GLOVE BIOGEL PI INDICATOR 7.5 (GLOVE) ×1
GLOVE BIOGEL PI INDICATOR 8 (GLOVE) ×2
GOWN PREVENTION PLUS XLARGE (GOWN DISPOSABLE) ×5 IMPLANT
IV NS IRRIG 3000ML ARTHROMATIC (IV SOLUTION) ×4 IMPLANT
NDL 1/2 CIR CATGUT .05X1.09 (NEEDLE) IMPLANT
NDL SCORPION MULTI FIRE (NEEDLE) IMPLANT
NDL SUT 6 .5 CRC .975X.05 MAYO (NEEDLE) IMPLANT
NEEDLE 1/2 CIR CATGUT .05X1.09 (NEEDLE) IMPLANT
NEEDLE MAYO TAPER (NEEDLE)
NEEDLE SCORPION MULTI FIRE (NEEDLE) ×2 IMPLANT
NS IRRIG 1000ML POUR BTL (IV SOLUTION) IMPLANT
PACK ARTHROSCOPY DSU (CUSTOM PROCEDURE TRAY) ×2 IMPLANT
PACK BASIN DAY SURGERY FS (CUSTOM PROCEDURE TRAY) ×2 IMPLANT
PENCIL BUTTON HOLSTER BLD 10FT (ELECTRODE) IMPLANT
RESECTOR FULL RADIUS 4.2MM (BLADE) ×2 IMPLANT
SLEEVE SCD COMPRESS KNEE MED (MISCELLANEOUS) ×2 IMPLANT
SLING ARM FOAM STRAP LRG (SOFTGOODS) IMPLANT
SLING ARM FOAM STRAP MED (SOFTGOODS) IMPLANT
SLING ARM FOAM STRAP XLG (SOFTGOODS) IMPLANT
SLING ARM IMMOBILIZER LRG (SOFTGOODS) ×1 IMPLANT
SLING ARM IMMOBILIZER MED (SOFTGOODS) IMPLANT
SPONGE GAUZE 4X4 12PLY (GAUZE/BANDAGES/DRESSINGS) ×2 IMPLANT
SPONGE LAP 4X18 X RAY DECT (DISPOSABLE) IMPLANT
STRIP CLOSURE SKIN 1/2X4 (GAUZE/BANDAGES/DRESSINGS) IMPLANT
SUCTION FRAZIER TIP 10 FR DISP (SUCTIONS) IMPLANT
SUPPORT WRAP ARM LG (MISCELLANEOUS) IMPLANT
SUT BONE WAX W31G (SUTURE) IMPLANT
SUT ETHIBOND 2 OS 4 DA (SUTURE) IMPLANT
SUT ETHILON 3 0 PS 1 (SUTURE) ×2 IMPLANT
SUT ETHILON 4 0 PS 2 18 (SUTURE) IMPLANT
SUT FIBERWIRE #2 38 T-5 BLUE (SUTURE)
SUT FIBERWIRE 2-0 18 17.9 3/8 (SUTURE)
SUT MNCRL AB 3-0 PS2 18 (SUTURE) IMPLANT
SUT MNCRL AB 4-0 PS2 18 (SUTURE) IMPLANT
SUT PDS AB 0 CT 36 (SUTURE) IMPLANT
SUT PROLENE 3 0 PS 2 (SUTURE) IMPLANT
SUT TIGER TAPE 7 IN WHITE (SUTURE) ×1 IMPLANT
SUT VIC AB 0 CT1 18XCR BRD 8 (SUTURE) IMPLANT
SUT VIC AB 0 CT1 8-18 (SUTURE)
SUT VIC AB 2-0 SH 18 (SUTURE) IMPLANT
SUTURE FIBERWR #2 38 T-5 BLUE (SUTURE) IMPLANT
SUTURE FIBERWR 2-0 18 17.9 3/8 (SUTURE) IMPLANT
SYR BULB 3OZ (MISCELLANEOUS) IMPLANT
TAPE FIBER 2MM 7IN #2 BLUE (SUTURE) ×1 IMPLANT
TOWEL OR 17X24 6PK STRL BLUE (TOWEL DISPOSABLE) ×3 IMPLANT
TOWEL OR NON WOVEN STRL DISP B (DISPOSABLE) ×3 IMPLANT
TUBE CONNECTING 20X1/4 (TUBING) ×3 IMPLANT
TUBING ARTHROSCOPY IRRIG 16FT (MISCELLANEOUS) ×2 IMPLANT
WAND STAR VAC 90 (SURGICAL WAND) ×2 IMPLANT
WATER STERILE IRR 1000ML POUR (IV SOLUTION) ×2 IMPLANT
YANKAUER SUCT BULB TIP NO VENT (SUCTIONS) IMPLANT

## 2012-09-12 NOTE — Anesthesia Procedure Notes (Addendum)
Anesthesia Regional Block:  Interscalene brachial plexus block  Pre-Anesthetic Checklist: ,, timeout performed, Correct Patient, Correct Site, Correct Laterality, Correct Procedure, Correct Position, site marked, Risks and benefits discussed,  Surgical consent,  Pre-op evaluation,  At surgeon's request and post-op pain management  Laterality: Right  Prep: chloraprep       Needles:  Injection technique: Single-shot  Needle Type: Echogenic Stimulator Needle     Needle Length: 5cm 5 cm Needle Gauge: 22 and 22 G    Additional Needles:  Procedures: ultrasound guided (picture in chart) and nerve stimulator Interscalene brachial plexus block  Nerve Stimulator or Paresthesia:  Response: 0.5 mA,   Additional Responses:   Narrative:  Start time: 09/12/2012 10:56 AM End time: 09/12/2012 11:08 AM Injection made incrementally with aspirations every 3 mL. Anesthesiologist: Dr Gypsy Balsam  Additional Notes: [phone] R ISB POP CHG prep, sterile tech #22 stim/echo needle w/good [place] visualization and pix in chart, stim at .5ma Multiple neg asp Vernia Buff .5% w/epi 1:200000 total 25cc+decadron 4mg  infiltrated No compl Dr Gypsy Balsam   Procedure Name: Intubation Performed by: York Grice Pre-anesthesia Checklist: Patient identified, Timeout performed, Emergency Drugs available, Suction available and Patient being monitored Patient Re-evaluated:Patient Re-evaluated prior to inductionOxygen Delivery Method: Circle system utilized Preoxygenation: Pre-oxygenation with 100% oxygen Intubation Type: IV induction Ventilation: Mask ventilation without difficulty Laryngoscope Size: Miller and 2 Grade View: Grade I Tube type: Oral Tube size: 8.0 mm Number of attempts: 2 Airway Equipment and Method: Stylet and Video-laryngoscopy Placement Confirmation: ETT inserted through vocal cords under direct vision,  breath sounds checked- equal and bilateral and positive ETCO2 Dental Injury: Teeth and Oropharynx  as per pre-operative assessment

## 2012-09-12 NOTE — H&P (Signed)
Jack Norris is an 72 y.o. male.   Chief Complaint: R shoulder pain  HPI: R shoulder small full thickness RCT, failed conservative treatment  Past Medical History  Diagnosis Date  . Arthritis   . Hypertension   . Eczema   . BPH (benign prostatic hypertrophy)   . Reflux OCCASIONAL-  WATCHES DIET  . History of kidney stones   . Simple renal cyst right interpoler, simpler in nature , asymptomatic  per urologist note (dr dahlstedt)  12-26-2011    BENIGN  . Renal calculi right     AND LEFT X1 NON-OBSTRUCTIVE  . Asthma, mild     as child-only now if has URI  . Wears glasses     Past Surgical History  Procedure Date  . Vasectomy 1970'S    GEN. ANES.  . Lithotripsy APPROX.  1998    ES[facility]  left kidney x 2  . Bone spur removal     BILATERAL GREAT TOE  . Rotator cuff repair 2012    LEFT SHOULDER  . Transurethral resection of prostate 11-29-2009    BPH  . Cysto/ right retrograde pyelogram/ right ureter dilatation/ right ureteroscopy/ laser lithotripsy right renal pelvis and lower pole calculi/ placement stent 01-11-2012  DR DAHLSTEDT    RIGHT RENAL PELVIS AND LOWER POLE CALCULI  . Cystoscopy with ureteroscopy 06/02/2012    Procedure: CYSTOSCOPY WITH URETEROSCOPY;  Surgeon: Marcine Matar, MD;  Location: [facility];  Service: Urology;  Laterality: Right;  90 MIN ALSO EXTRACTION OF CALCULI   . Cystoscopy w/ retrogrades 06/02/2012    Procedure: CYSTOSCOPY WITH RETROGRADE PYELOGRAM;  Surgeon: Marcine Matar, MD;  Location: [facility];  Service: Urology;  Laterality: Right;    Family History  Problem Relation Age of Onset  . Cancer Mother     breast and kidney   . Cancer Father     lung  . Heart disease Father    Social History:  reports that he quit smoking about 45 years ago. His smoking use included Cigarettes. He quit after 8 years of use. He has never used smokeless tobacco. He reports that he drinks alcohol. He reports that he does not  use illicit drugs.  Allergies:  Allergies  Allergen Reactions  . Paroxetine     REACTION: Had mild personality change on this medication and doesn't want to take this again.  . Ciprofloxacin Other (See Comments)    ABDOMINAL PAIN/ sob  . Flagyl (Metronidazole Hcl) Other (See Comments)    ABDOMINAL PAIN/ sob    Medications Prior to Admission  Medication Sig Dispense Refill  . aspirin 81 MG tablet Take 81 mg by mouth daily.       Marland Kitchen gemfibrozil (LOPID) 600 MG tablet Take 1 tablet (600 mg total) by mouth daily. Take one tablet daily  90 tablet  1  . lisinopril (PRINIVIL,ZESTRIL) 20 MG tablet Take 1 tablet (20 mg total) by mouth daily.  90 tablet  3  . niacin (NIASPAN) 1000 MG CR tablet Take 2,000 mg by mouth at bedtime.      . Testosterone (ANDROGEL PUMP) 20.25 MG/ACT (1.62%) GEL Apply 2 application topically daily. Apply as directed  75 g  0  . albuterol (PROVENTIL HFA;VENTOLIN HFA) 108 (90 BASE) MCG/ACT inhaler Inhale 2 puffs into the lungs every 6 (six) hours as needed.      . traMADol (ULTRAM) 50 MG tablet Take 50 mg by mouth every 6 (six) hours as needed.  Results for orders placed during the hospital encounter of 09/12/12 (from the past 48 hour(s))  POCT HEMOGLOBIN-HEMACUE     Status: Normal   Collection Time   09/12/12 10:54 AM      Component Value Range Comment   Hemoglobin 15.3  13.0 - 17.0 g/dL    No results found.  Review of Systems  All other systems reviewed and are negative.    Blood pressure 131/60, pulse 76, temperature 98.2 F (36.8 C), temperature source Oral, resp. rate 18, height 5' 9.5" (1.765 m), weight 97.07 kg (214 lb), SpO2 98.00%. Physical Exam  Constitutional: He is oriented to person, place, and time. He appears well-developed and well-nourished.  HENT:  Head: Atraumatic.  Eyes: EOM are normal.  Cardiovascular: Intact distal pulses.   Respiratory: Effort normal.  Musculoskeletal:       Right shoulder: He exhibits tenderness and decreased  strength.  Neurological: He is alert and oriented to person, place, and time.  Skin: Skin is warm and dry.  Psychiatric: He has a normal mood and affect.     Assessment/Plan  R shoulder small full thickness RCT, failed conservative treatment Plan R arth RCR, SAD Risks / benefits of surgery discussed Consent on chart  NPO for OR Preop antibiotics   Jack Norris 09/12/2012, 11:33 AM

## 2012-09-12 NOTE — Op Note (Signed)
Procedure(s): SHOULDER ARTHROSCOPY WITH ROTATOR CUFF REPAIR AND SUBACROMIAL DECOMPRESSION Procedure Note  Jack Norris male 72 y.o. 09/12/2012  Procedure(s) and Anesthesia Type:   #1 right shoulder arthroscopic rotator cuff repair   #2 right shoulder arthroscopic subacromial decompression   #3 right shoulder arthroscopic long head biceps tenotomy and debridement of superior labral tear  Postoperative diagnosis:   #1 right shoulder supraspinatus tear   #2 right shoulder impingement   #3 right shoulder superior labral and proximal long head biceps tear  Surgeon(s) and Role:    * Mable Paris, MD - Primary     Surgeon: Mable Paris   Assistants: Damita Lack PA-C (Danielle was present and scrubbed throughout the procedure and was essential in positioning, assisting with the camera and instrumentation, and closure)  Anesthesia: General endotracheal anesthesia with preoperative interscalene block    Procedure Detail   Estimated Blood Loss: Min         Drains: none  Blood Given: none         Specimens: none        Complications:  * No complications entered in OR log *         Disposition: PACU - hemodynamically stable.         Condition: stable    Procedure:   INDICATIONS FOR SURGERY: The patient is 72 y.o. male who has asymptomatic full-thickness right shoulder rotator cuff tear which failed conservative treatment. He was indicated for surgical treatment to restore function and decrease pain. He understood risks benefits alternatives to the procedure including but not limited to risk of bleeding infection damage to neurovascular structures, stiffness, nonhealing, and incomplete pain relief. He wished to go forward with surgery to  OPERATIVE FINDINGS: Examination under anesthesia: No stiffness or instability Diagnostic Arthroscopy:  Glenoid articular cartilage: Intact Humeral head articular cartilage: Intact Labrum: Extensive tearing of  the superior labrum into the biceps anchor, the proximal long head biceps tendon was also involved. The biceps tenotomy was carried out. Loose bodies: None Synovitis: Mild Rotator cuff: He had a full-thickness supraspinatus tear measuring about 1/2-2 cm in width. There was minimal retraction and the tendon was easily reduced. This was repaired in a double rose speed bridge fashion with 4 4.75 biocomposite swivel lock anchors using 2 fiber tapes in a crossing pattern. Tendon quality was excellent but bone quality was poor. Coracoacromial ligament: Severely frayed indicating chronic impingement. He had a hooked anterior acromion which was taken down the standard acromioplasty.  DESCRIPTION OF PROCEDURE: The patient was identified in preoperative  holding area where I personally marked the operative site after  verifying site, side, and procedure with the patient. An interscalene block was given by the attending anesthesiologist the holding area.  The patient was taken back to the operating room where general anesthesia was induced without complication and was placed in the beach-chair position with the back  elevated about 60 degrees and all extremities and head and neck carefully padded and  positioned.   The right upper extremity was then prepped and  draped in a standard sterile fashion. The appropriate time-out  procedure was carried out. The patient did receive IV antibiotics  within 30 minutes of incision.   A small posterior portal incision was made and the arthroscope was introduced into the joint. An anterior portal was then established above the subscapularis using needle localization. Small cannula was placed anteriorly. Diagnostic arthroscopy was then carried out with findings as described above.  The intra-articular portion of the long  head biceps tendon was carefully examined. The superior labrum was probed. He had significant tearing of the superior labrum into the biceps anchor.  The biceps long head root was also involved. This was felt to be a likely pain generator and therefore a biceps tenotomy was carried out through the intra-articular anterior portal using a large biter. The remaining superior labrum was debrided back to stable base.  The undersurface of the rotator cuff was carefully examined. The subscapularis was intact. The posterior rotator cuff was intact. He had a full-thickness tear of the supraspinatus involving essentially the entire supraspinatus footprint. There is not significant retraction. the undersurface of the tear was debrided through the anterior portal.  The arthroscope was then introduced into the subacromial space a standard lateral portal was established with needle localization. The shaver was used through the lateral portal to perform extensive bursectomy. Coracoacromial ligament was examined and found to be severely frayed indicating chronic impingement.  After the bursectomy the rotator cuff tear was identified from the bursal surface. A lateral portal was established and then a posterior lateral portal was established for viewing. A large cannula was placed laterally. The cuff edge was debrided back to healthy tendon. The tuberosity was debrided down to bleeding bone using a bur. A grasper was used to verify that the cuff could easily be reduced to its anatomic position. Anchors were then placed percutaneously just off the lateral edge of the acromion placing 2 4.75 mm swivel lock anchors spread evenly along the articular margin in the medial row. Each of these had a pre-loaded fiber tape. The 4 strands of the 2 fiber tapes were then passed evenly about 10 mm medial to the cuff edge. The most anterior suture and then the anterior suture from the posterior anchor were brought together into a lateral row anchor placed anterolaterally. The remaining sutures were then brought to a posterior lateral anchor. This created a suture bridge bringing the tendon  nicely down over the prepared tuberosity. The repair was viewed from posterior lateral and lateral portals and felt to be excellent.  The coracoacromial ligament was taken down off the anterior acromion with the ArthroCare exposing a  moderatehooked anterior anterior acromial spur. A high-speed bur was then used through the lateral portal to take down the anterior acromial spur from lateral to medial in a standard acromioplasty.  The acromioplasty was also viewed from the lateral portal and the bur was used as necessary to ensure that the acromion was completely flat from posterior to anterior.  The arthroscopic equipment was removed from the joint and the portals were closed with 3-0 nylon in an interrupted fashion. Sterile dressings were then applied including Xeroform 4 x 4's ABDs and tape. The patient was then allowed to awaken from general anesthesia, placed in a sling, transferred to the stretcher and taken to the recovery room in stable condition.   POSTOPERATIVE PLAN: The patient will be discharged home today and will followup in one week for suture removal and wound check.  He will follow the standard cuff protocol.

## 2012-09-12 NOTE — Anesthesia Postprocedure Evaluation (Signed)
  Anesthesia Post-op Note  Patient: Jack Norris  Procedure(s) Performed: Procedure(s) (LRB) with comments: SHOULDER ARTHROSCOPY WITH ROTATOR CUFF REPAIR AND SUBACROMIAL DECOMPRESSION (Right) - see above and biceps tendonotomy  Patient Location: PACU  Anesthesia Type:GA combined with regional for post-op pain  Level of Consciousness: awake and alert   Airway and Oxygen Therapy: Patient Spontanous Breathing  Post-op Pain: none  Post-op Assessment: Post-op Vital signs reviewed, Patient's Cardiovascular Status Stable, Respiratory Function Stable, Patent Airway, No signs of Nausea or vomiting, Adequate PO intake and Pain level controlled  Post-op Vital Signs: stable  Complications: No apparent anesthesia complications

## 2012-09-12 NOTE — Transfer of Care (Signed)
Immediate Anesthesia Transfer of Care Note  Patient: Jack Norris  Procedure(s) Performed: Procedure(s) (LRB) with comments: SHOULDER ARTHROSCOPY WITH ROTATOR CUFF REPAIR AND SUBACROMIAL DECOMPRESSION (Right) - see above and biceps tendonotomy  Patient Location: PACU  Anesthesia Type:GA combined with regional for post-op pain  Level of Consciousness: sedated and patient cooperative  Airway & Oxygen Therapy: Patient Spontanous Breathing and Patient connected to face mask oxygen  Post-op Assessment: Report given to PACU RN and Post -op Vital signs reviewed and stable  Post vital signs: Reviewed and stable  Complications: No apparent anesthesia complications

## 2012-09-12 NOTE — Telephone Encounter (Signed)
Pt request refill Gemfibrozil to [facility]. Advised pt done.

## 2012-09-12 NOTE — Anesthesia Preprocedure Evaluation (Signed)
Anesthesia Evaluation  Patient identified by MRN, date of birth, ID band Patient awake    Reviewed: Allergy & Precautions, H&P , NPO status , Patient's Chart, lab work & pertinent test results  Airway Mallampati: II TM Distance: >3 FB Neck ROM: Full    Dental No notable dental hx. (+)    Pulmonary asthma , former smoker,  breath sounds clear to auscultation  Pulmonary exam normal       Cardiovascular Exercise Tolerance: Good hypertension, Pt. on medications Rhythm:Regular Rate:Normal  ECG: SR LAD   Neuro/Psych negative neurological ROS  negative psych ROS   GI/Hepatic Neg liver ROS, GERD-  Controlled,  Endo/Other  negative endocrine ROS  Renal/GU negative Renal ROS  negative genitourinary   Musculoskeletal negative musculoskeletal ROS (+)   Abdominal (+) + obese,   Peds negative pediatric ROS (+)  Hematology negative hematology ROS (+)   Anesthesia Other Findings   Reproductive/Obstetrics negative OB ROS                           Anesthesia Physical Anesthesia Plan  ASA: II  Anesthesia Plan: General   Post-op Pain Management:    Induction: Intravenous  Airway Management Planned: Oral ETT  Additional Equipment:   Intra-op Plan:   Post-operative Plan: Extubation in OR  Informed Consent: I have reviewed the patients History and Physical, chart, labs and discussed the procedure including the risks, benefits and alternatives for the proposed anesthesia with the patient or authorized representative who has indicated his/her understanding and acceptance.     Plan Discussed with: CRNA and Surgeon  Anesthesia Plan Comments:         Anesthesia Quick Evaluation

## 2012-09-12 NOTE — Progress Notes (Signed)
Assisted Dr. Kasik with right, ultrasound guided, interscalene  block. Side rails up, monitors on throughout procedure. See vital signs in flow sheet. Tolerated Procedure well. 

## 2012-09-14 ENCOUNTER — Encounter (HOSPITAL_BASED_OUTPATIENT_CLINIC_OR_DEPARTMENT_OTHER): Payer: Self-pay | Admitting: Orthopedic Surgery

## 2012-10-05 ENCOUNTER — Encounter: Payer: Self-pay | Admitting: Family Medicine

## 2012-10-05 ENCOUNTER — Ambulatory Visit (INDEPENDENT_AMBULATORY_CARE_PROVIDER_SITE_OTHER): Payer: Medicare Other | Admitting: Family Medicine

## 2012-10-05 ENCOUNTER — Telehealth: Payer: Self-pay | Admitting: Family Medicine

## 2012-10-05 VITALS — BP 117/88 | HR 93 | Temp 97.8°F | Ht 69.5 in | Wt 211.5 lb

## 2012-10-05 DIAGNOSIS — R42 Dizziness and giddiness: Secondary | ICD-10-CM

## 2012-10-05 DIAGNOSIS — I951 Orthostatic hypotension: Secondary | ICD-10-CM

## 2012-10-05 MED ORDER — LISINOPRIL 20 MG PO TABS: 10.0000 mg | ORAL_TABLET | Freq: Every day | ORAL | Status: DC

## 2012-10-05 NOTE — Telephone Encounter (Signed)
Patient Information:  Caller Name: An[place]aun  Phone: [phone]  Patient: Jack Norris, Jack Norris  Gender: Male  DOB: May 24, 1941  Age: 72 Years  PCP: Kerby Nora (Family Practice)  Office Follow Up:  Does the office need to follow up with this patient?: No  Instructions For The Office: N/A   Symptoms  Reason For Call & Symptoms: Has dizziiness whenever he  bends over or leans over since Mon 2/10.  Sometimes feels dull in head.  Rolled over in bed once last night 2/11 and experienced vertigo but that has gone away  Reviewed Health History In EMR: Yes  Reviewed Medications In EMR: Yes  Reviewed Allergies In EMR: Yes  Reviewed Surgeries / Procedures: Yes  Date of Onset of Symptoms: 10/03/2012  Guideline(s) Used:  Dizziness  Disposition Per Guideline:   See Today in Office  Reason For Disposition Reached:   Patient wants to be seen  Advice Given:  N/A  Appointment Scheduled:  10/05/2012 11:45:00 Appointment Scheduled Provider:  Hannah Beat (Family Practice)

## 2012-10-05 NOTE — Progress Notes (Signed)
Nature conservation officer at [facility] [address] [place] [address] Phone: [phone] Fax: [phone]  Date:  10/05/2012   Name:  Jack Norris   DOB:  Jul 15, 1941   MRN:  [id number] Gender: male Age: 72 y.o.  Primary Physician:  Kerby Nora, MD  Evaluating MD: Hannah Beat, MD   Chief Complaint: Dizziness   History of Present Illness:  Jack Norris is a 72 y.o. pleasant patient who presents with the following:  A week ago :Dizziness, unsteadiness when walking. Was having some unsteadiness with oxycodone and tramadol, yesterday, started to feel a little bit more lightheaded. He was a lot steadier since discontinuing all of his pain medication, and he discontinued these approximately 5 days ago. He is approximately 2-1/2 weeks out from a RIGHT arthroscopic rotator cuff repair. His pain is manageable, and he is not taking anything aside from Tylenol and NSAIDs right now.  Sleeps on back, and when turned over, the room started to get some spinning. Vertigo, but it went away. Hit the wall this morning and wife had to help him to get up. He felt very lightheaded, as if he was going to pass out, but he did not truly lose consciousness. He has not had any palpitations or rapid heartbeat. No chest pain or shortness of breath.    Patient Active Problem List  Diagnosis  . TESTOSTERONE DEFICIENCY  . HYPERCHOLESTEROLEMIA  . HYPERTRIGLYCERIDEMIA  . OBESITY  . LOSS, HEARING NEC  . HYPERTENSION  . ASTHMA, CHILDHOOD  . GERD  . B P H  . OSTEOARTHRITIS  . ICHTHYOSIS  . Nephrolithiasis  . Solitary cyst of kidney    Past Medical History  Diagnosis Date  . Arthritis   . Hypertension   . Eczema   . BPH (benign prostatic hypertrophy)   . Reflux OCCASIONAL-  WATCHES DIET  . History of kidney stones   . Simple renal cyst right interpoler, simpler in nature , asymptomatic  per urologist note (dr dahlstedt)  12-26-2011    BENIGN  . Renal calculi right     AND LEFT X1  NON-OBSTRUCTIVE  . Asthma, mild     as child-only now if has URI  . Wears glasses     Past Surgical History  Procedure Laterality Date  . Vasectomy  1970'S    GEN. ANES.  . Lithotripsy  APPROX.  1998    ESWL  left kidney x 2  . Bone spur removal      BILATERAL GREAT TOE  . Rotator cuff repair  2012    LEFT SHOULDER  . Transurethral resection of prostate  11-29-2009    BPH  . Cysto/ right retrograde pyelogram/ right ureter dilatation/ right ureteroscopy/ laser lithotripsy right renal pelvis and lower pole calculi/ placement stent  01-11-2012  DR DAHLSTEDT    RIGHT RENAL PELVIS AND LOWER POLE CALCULI  . Cystoscopy with ureteroscopy  06/02/2012    Procedure: CYSTOSCOPY WITH URETEROSCOPY;  Surgeon: Marcine Matar, MD;  Location: [facility];  Service: Urology;  Laterality: Right;  90 MIN ALSO EXTRACTION OF CALCULI   . Cystoscopy w/ retrogrades  06/02/2012    Procedure: CYSTOSCOPY WITH RETROGRADE PYELOGRAM;  Surgeon: Marcine Matar, MD;  Location: [facility];  Service: Urology;  Laterality: Right;  . Shoulder arthroscopy with rotator cuff repair and subacromial decompression  09/12/2012    Procedure: SHOULDER ARTHROSCOPY WITH ROTATOR CUFF REPAIR AND SUBACROMIAL DECOMPRESSION;  Surgeon: Mable Paris, MD;  Location: [facility];  Service: Orthopedics;  Laterality: Right;  see above and biceps tendonotomy    History  Substance Use Topics  . Smoking status: Former Smoker -- 8 years    Types: Cigarettes    Quit date: 01/05/1967  . Smokeless tobacco: Never Used     Comment: QUIT AGE 52  . Alcohol Use: Yes     Comment: OCCASIONAL    Family History  Problem Relation Age of Onset  . Cancer Mother     breast and kidney   . Cancer Father     lung  . Heart disease Father     Allergies  Allergen Reactions  . Paroxetine     REACTION: Had mild personality change on this medication and doesn't want to take this again.  .  Ciprofloxacin Other (See Comments)    ABDOMINAL PAIN/ sob  . Flagyl (Metronidazole Hcl) Other (See Comments)    ABDOMINAL PAIN/ sob    Medication list has been reviewed and updated.  Outpatient Prescriptions Prior to Visit  Medication Sig Dispense Refill  . albuterol (PROVENTIL HFA;VENTOLIN HFA) 108 (90 BASE) MCG/ACT inhaler Inhale 2 puffs into the lungs every 6 (six) hours as needed.      Marland Kitchen aspirin 81 MG tablet Take 81 mg by mouth daily.       Marland Kitchen lisinopril (PRINIVIL,ZESTRIL) 20 MG tablet Take 1 tablet (20 mg total) by mouth daily.  90 tablet  3  . niacin (NIASPAN) 1000 MG CR tablet Take 2,000 mg by mouth at bedtime.      . Testosterone (ANDROGEL PUMP) 20.25 MG/ACT (1.62%) GEL Apply 2 application topically daily. Apply as directed  75 g  0  . gemfibrozil (LOPID) 600 MG tablet Take 1 tablet (600 mg total) by mouth daily. Take one tablet daily  90 tablet  1  . oxyCODONE-acetaminophen (ROXICET) 5-325 MG per tablet Take 1-2 tablets by mouth every 4 (four) hours as needed for pain.  60 tablet  0  . traMADol (ULTRAM) 50 MG tablet Take 50 mg by mouth every 6 (six) hours as needed.       No facility-administered medications prior to visit.    Review of Systems:  Recent right-sided shoulder surgery. No fever, chills, sweats, cold, flu, or other acute illnesses. No chest pain or shortness of breath  Physical Examination: Filed Vitals:   10/05/12 1128 10/05/12 1211 10/05/12 1213 10/05/12 1214  BP: 130/78 133/77 119/83 117/88  Pulse: 78 79 83 93  Temp: 97.8 F (36.6 C)     TempSrc: Oral     Height: 5' 9.5" (1.765 m)     Weight: 211 lb 8 oz (95.936 kg)     SpO2: 98%        Ideal Body Weight: Weight in (lb) to have BMI = 25: 171.4   GEN: WDWN, NAD, Non-toxic, A & O x 3 HEENT: Atraumatic, Normocephalic. Neck supple. No masses, No LAD. Ears and Nose: No external deformity. CV: RRR, No M/G/R. No JVD. No thrill. No extra heart sounds. PULM: CTA B, no wheezes, crackles, rhonchi. No  retractions. No resp. distress. No accessory muscle use. ABD: S, NT, ND, +BS. No rebound tenderness. No HSM.  EXTR: No c/c/e  Neuro: CN 2-12 grossly intact. PERRLA. EOMI. Sensation intact throughout. Str 5/5 all extremities. DTR 2+. No clonus. A and o x 4. Romberg neg. Finger nose neg. Heel -shin neg.   PSYCH: Normally interactive. Conversant. Not depressed or anxious appearing.  Calm demeanor.    Assessment and Plan:  Orthostasis  Dizziness and giddiness  His blood pressure didn't drop with standing and sitting up as well as his pulse increased, but not enough to be true orthostatic hypotension. Nevertheless, he is dehydrated some, and I suspect that this is most likely orthostasis and dropping his blood pressure. I again have him cut his blood pressure medication in half, hydrate aggressively, and see how he does from a clinical stance.  Orders Today:  No orders of the defined types were placed in this encounter.    Updated Medication List: (Includes new medications, updates to list, dose adjustments) Meds ordered this encounter  Medications  . lisinopril (PRINIVIL,ZESTRIL) 20 MG tablet    Sig: Take 0.5 tablets (10 mg total) by mouth daily.    Dispense:  90 tablet    Refill:  3    Medications Discontinued: Medications Discontinued During This Encounter  Medication Reason  . oxyCODONE-acetaminophen (ROXICET) 5-325 MG per tablet Error  . traMADol (ULTRAM) 50 MG tablet Error  . lisinopril (PRINIVIL,ZESTRIL) 20 MG tablet Reorder      Signed, Karleen Hampshire T. Seraphim Affinito, MD 10/05/2012 12:16 PM

## 2012-10-17 ENCOUNTER — Other Ambulatory Visit: Payer: Self-pay | Admitting: *Deleted

## 2012-10-17 MED ORDER — NIACIN ER (ANTIHYPERLIPIDEMIC) 1000 MG PO TBCR: 2000.0000 mg | EXTENDED_RELEASE_TABLET | Freq: Every day | ORAL | Status: DC

## 2012-10-24 ENCOUNTER — Telehealth: Payer: Self-pay | Admitting: Family Medicine

## 2012-10-24 NOTE — Telephone Encounter (Signed)
I would be interested in Dr. Daphine Deutscher opinion  In this case, too. Please cc me on note

## 2012-10-24 NOTE — Telephone Encounter (Signed)
Patient Information:  Caller Name: Earvin Hansen  Phone: [phone]  Patient: Jack Norris, Jack Norris  Gender: Male  DOB: [birth year]  Age: 72 Years  PCP: Kerby Nora (Family Practice)  Office Follow Up:  Does the office need to follow up with this patient?: No  Instructions For The Office: N/A   Symptoms  Reason For Call & Symptoms: Pt had right  rotator cuff surgery  six weeks ago.  Two weeks ago he started experiencing dizziness and saw Dr. Dallas Schimke who suggested he decrease his B/P medicaitons.  This has not helped.  Pt has had Vertigo before and states this is different.  He is still unsteady during the day, especilally when he leans over, or when he first awakens.  He wants an appt with Dr.  Ermalene Searing only next.  He also has a mild H/A in the back on top x 2 weeks that is relieved with Acetaminophen.    Reviewed Health History In EMR: Yes  Reviewed Medications In EMR: Yes  Reviewed Allergies In EMR: Yes  Reviewed Surgeries / Procedures: Yes  Date of Onset of Symptoms: 10/13/2012  Treatments Tried: Taking 5 mg of Lisinopril and hydrated.  tylenol helped with H/A.  Treatments Tried Worked: No  Guideline(s) Used:  Dizziness  Disposition Per Guideline:   Go to Office Now  Reason For Disposition Reached:   Lightheadedness (dizziness) present now, after 2 hours of rest and fluids  Advice Given:  Some Causes of Temporary Dizziness:  Poor Fluid Intake - Not drinking enough fluids and being a little dehydrated is a common cause of temporary dizziness. This is always worse during hot weather.  Standing Up Suddenly - Standing up suddenly (especially getting out of bed) or prolonged standing in one place are common causes of temporary dizziness. Not drinking enough fluids always makes it worse. Certain medications can cause or increase this type of dizziness (e.g., blood pressure medications).  Drink Fluids:  Drink several glasses of fruit juice, other clear fluids, or water. This will improve  hydration and blood glucose. If you have a fever or have had heat exposure, make sure the fluids are cold.  Rest for 1-2 Hours:  Lie down with feet elevated for 1 hour. This will improve blood flow and increase blood flow to the brain.  Stand Up Slowly:  In the mornings, sit up for a few minutes before you stand up. That will help your blood flow make the adjustment.  Call Back If:  Still feel dizzy after 2 hours of rest and fluids  Passes out (faints)  You become worse.  RN Overrode Recommendation:  Make Appointment  Pt only wants to be seen by Dr. Ermalene Searing and she is not in today, will be in 10/25/12.  Appointment Scheduled:  10/25/2012 08:15:00 Appointment Scheduled Provider:  Kerby Nora [facility])

## 2012-10-25 ENCOUNTER — Ambulatory Visit (INDEPENDENT_AMBULATORY_CARE_PROVIDER_SITE_OTHER): Payer: Medicare Other | Admitting: Family Medicine

## 2012-10-25 ENCOUNTER — Ambulatory Visit: Payer: Self-pay | Admitting: Family Medicine

## 2012-10-25 ENCOUNTER — Encounter: Payer: Self-pay | Admitting: Family Medicine

## 2012-10-25 VITALS — BP 130/80 | HR 94 | Temp 98.0°F | Ht 69.5 in | Wt 215.0 lb

## 2012-10-25 DIAGNOSIS — H8110 Benign paroxysmal vertigo, unspecified ear: Secondary | ICD-10-CM | POA: Insufficient documentation

## 2012-10-25 NOTE — Progress Notes (Signed)
Subjective:    Patient ID: Jack Norris, male    DOB: 06-30-1941, 72 y.o.   MRN: [id number]  HPI  Jack Norris is a 72 y.o. pleasant patient who presents with continued dizziness  Saw Dr Patsy Lager on 2/12 for: 1 week ago : Sudden onset Dizziness, unsteadiness when walking. Was having some unsteadiness with oxycodone and tramadol, yesterday, started to feel a little bit more lightheaded. He was a lot steadier since discontinuing all of his pain medication, and he discontinued these approximately 5 days ago. He is approximately 2-1/2 weeks out from a RIGHT arthroscopic rotator cuff repair. His pain is manageable, and he is not taking anything aside from Tylenol and NSAIDs right now.  Sleeps on back, and when turned over, the room started to get some spinning. Vertigo, but it went away. Hit the wall this morning and wife had to help him to get up. He felt very lightheaded, as if he was going to pass out, but he did not truly lose consciousness. He has not had any palpitations or rapid heartbeat. No chest pain or shortness of breath.  Dr. Patsy Lager felt issue could be due to orthostatic hypotension. Recommended  him to cut his blood pressure medication in half, hydrate aggressively. He tried to drop BP meds to half.. No improvement in symptom   Since then pt reports shoulder is healing really well from surgery now 7 weeks ago Getting PT. No longer on pain med.  No complications with surgery except pre-op nerve block.. Facial droop, vocal cord change, decrease breathing, cough...resolved in 24 hours  He reports continued room spinning when he lies back or gets up. Rolling bed. Moving head quickly.  Lasts 5 secs.  alking is slightly different then previously, wife has noted he looks more unsteady.  Mild headhache in AM, tylenol makes it go away. No prescyncope.  No confusion, no vision change, no weakness, no numbness.. No other new neuro symptoms.  Has histroy of benign vertigo in  past.    Review of Systems  Constitutional: Negative for fever, fatigue and unexpected weight change.  HENT: Negative for ear pain, congestion, sore throat, rhinorrhea, trouble swallowing and postnasal drip.   Eyes: Negative for pain.  Respiratory: Negative for cough, shortness of breath and wheezing.   Cardiovascular: Negative for chest pain, palpitations and leg swelling.  Gastrointestinal: Negative for nausea, abdominal pain, diarrhea, constipation and blood in stool.  Skin: Negative for rash.  Neurological: Positive for headaches. Negative for syncope, weakness and numbness.  Psychiatric/Behavioral: Negative for behavioral problems and dysphoric mood. The patient is not nervous/anxious.        Objective:   Physical Exam  Constitutional: He is oriented to person, place, and time. Vital signs are normal. He appears well-developed and well-nourished.  HENT:  Head: Normocephalic.  Right Ear: Hearing normal.  Left Ear: Hearing normal.  Nose: Nose normal.  Mouth/Throat: Oropharynx is clear and moist and mucous membranes are normal.  Neck: Trachea normal. Carotid bruit is not present. No mass and no thyromegaly present.  Cardiovascular: Normal rate, regular rhythm and normal pulses.  Exam reveals no gallop, no distant heart sounds and no friction rub.   No murmur heard. No peripheral edema  Pulmonary/Chest: Effort normal and breath sounds normal. No respiratory distress.  Neurological: He is alert and oriented to person, place, and time. He has normal strength and normal reflexes. He displays no atrophy. No cranial nerve deficit or sensory deficit. He exhibits normal muscle tone. He displays a negative Romberg  sign. Coordination and gait normal.  Mildly positive dix hallpike  Skin: Skin is warm, dry and intact. No rash noted.  Psychiatric: He has a normal mood and affect. His speech is normal and behavior is normal. Thought content normal.          Assessment & Plan:

## 2012-10-25 NOTE — Patient Instructions (Addendum)
Home position manuevers. Can use meclizine if very symptomatic.  Do not drive while vertigo is present. Call if symptoms not improving after 2 weeks for referral to rehab.

## 2012-10-25 NOTE — Assessment & Plan Note (Signed)
May initially had some dehydration and SE to pain meds... Lightheadedness now resolved.  Only vertigo remains , intermittant a dn positional.  Likely BPPV as he has had in past.  No sign of neuro changes... Doubt CVA or brain tumor. Will treat with home eply maneuver, info given. If not improving consider vestibular rehab.

## 2012-10-25 NOTE — Telephone Encounter (Signed)
Noted. Will see pt in an appointment.

## 2012-12-05 ENCOUNTER — Telehealth: Payer: Self-pay | Admitting: Family Medicine

## 2012-12-05 NOTE — Telephone Encounter (Signed)
Patient Information:  Caller Name: Kase  Phone: [phone]  Patient: Jack Norris, Jack Norris  Gender: Male  DOB: May 06, 1941  Age: 72 Years  PCP: Kerby Nora (Family Practice)  Office Follow Up:  Does the office need to follow up with this patient?: No  Instructions For The Office: N/A  RN Note:  Pt declined to go to [facility] or ED (since he has had intermittent pain since 11/30/2012) and requested appointmemt for 12/06/2012.  Symptoms  Reason For Call & Symptoms: Onset 11/30/2012 intermittent left lower quadrant abd pain (rates 3-4 on 0-10 scale).  Had fever on 04/10-06/2013, checked by touch.  Chills also then.  Pain worsened if "bladder or colon full" according to pt.  Urologist office visit 12/05/2012.  Reviewed Health History In EMR: Yes  Reviewed Medications In EMR: Yes  Reviewed Allergies In EMR: Yes  Reviewed Surgeries / Procedures: Yes  Date of Onset of Symptoms: 11/30/2012  Treatments Tried: APAP 1,000 mg every six hour for fever.  Treatments Tried Worked: No  Any Fever: Yes  Fever Taken: Oral  Fever Time Of Reading: 09:00:00  Fever Last Reading: 98.5  Guideline(s) Used:  Abdominal Pain - Male  Disposition Per Guideline:   Go to ED Now (or to Office with PCP Approval)  Reason For Disposition Reached:   Constant abdominal pain lasting > 2 hours  Advice Given:  Call Back If:  Abdominal pain is constant and present for more than 2 hours  You become worse.  Patient Will Follow Care Advice:  YES  Appointment Scheduled:  12/06/2012 09:30:00 Appointment Scheduled Provider:  Kerby Nora [facility])

## 2012-12-06 ENCOUNTER — Other Ambulatory Visit: Payer: Self-pay | Admitting: *Deleted

## 2012-12-06 ENCOUNTER — Encounter: Payer: Self-pay | Admitting: Family Medicine

## 2012-12-06 ENCOUNTER — Ambulatory Visit (INDEPENDENT_AMBULATORY_CARE_PROVIDER_SITE_OTHER): Payer: Medicare Other | Admitting: Family Medicine

## 2012-12-06 VITALS — BP 128/80 | HR 103 | Temp 97.8°F | Ht 69.5 in | Wt 214.5 lb

## 2012-12-06 DIAGNOSIS — R1032 Left lower quadrant pain: Secondary | ICD-10-CM | POA: Insufficient documentation

## 2012-12-06 MED ORDER — AMOXICILLIN-POT CLAVULANATE 875-125 MG PO TABS: 1.0000 | ORAL_TABLET | Freq: Two times a day (BID) | ORAL | Status: DC

## 2012-12-06 MED ORDER — TESTOSTERONE 20.25 MG/ACT (1.62%) TD GEL: 2.0000 "application " | Freq: Every day | TRANSDERMAL | Status: DC

## 2012-12-06 NOTE — Progress Notes (Signed)
  Subjective:    Patient ID: Jack Norris, male    DOB: September 17, 1940, 72 y.o.   MRN: [id number]  HPI  72 year old male with history of GERD, kidney stones presents with 1 week of pain in LLQ.  This feels different to pain from kidney stones he has had in past. Saw urologist, X-ray and [place] of kidneys was nml, no blood in urine.  Tenderness to palpation now, no pain with movement.  No change in stool, no blood, no N/V. Taking metamucil in past.   Treated with acetaminophen. Has helped some.  Has had diverticulosis noted on  CT abdomen in past. Last colonoscopy  7 years ago, nml, no polyps.  Last seen for BPPV.Marland Kitchen Resolved after Eply maneuver.   Review of Systems  Constitutional: Positive for fatigue. Negative for fever.  HENT: Negative for ear pain.   Eyes: Negative for pain.  Respiratory: Negative for chest tightness, shortness of breath and wheezing.   Cardiovascular: Negative for chest pain, palpitations and leg swelling.  Gastrointestinal: Positive for abdominal pain. Negative for abdominal distention and anal bleeding.  Genitourinary: Negative for urgency, flank pain, discharge, scrotal swelling, penile pain and testicular pain.       Objective:   Physical Exam  Constitutional: Vital signs are normal. He appears well-developed and well-nourished.  HENT:  Head: Normocephalic.  Right Ear: Hearing normal.  Left Ear: Hearing normal.  Nose: Nose normal.  Mouth/Throat: Oropharynx is clear and moist and mucous membranes are normal.  Neck: Trachea normal. Carotid bruit is not present. No mass and no thyromegaly present.  Cardiovascular: Normal rate, regular rhythm and normal pulses.  Exam reveals no gallop, no distant heart sounds and no friction rub.   No murmur heard. No peripheral edema  Pulmonary/Chest: Effort normal and breath sounds normal. No respiratory distress.  Abdominal: Soft. Normal appearance and bowel sounds are normal. He exhibits no fluid wave and no ascites.  There is no hepatosplenomegaly. There is tenderness in the left lower quadrant. There is no rigidity, no rebound, no guarding, no CVA tenderness, no tenderness at McBurney's point and negative Murphy's sign. No hernia.  Skin: Skin is warm, dry and intact. No rash noted.  Psychiatric: He has a normal mood and affect. His speech is normal and behavior is normal. Thought content normal.          Assessment & Plan:

## 2012-12-06 NOTE — Patient Instructions (Addendum)
This is likely diverticulitis. Treat with fluids, rest and antibiotics. Follow up next week on Tuesday. If not dramatically better in 48-72 hours or if pain worsening.. Follow up and we will evaluate with CT abd pelvis.

## 2012-12-06 NOTE — Assessment & Plan Note (Signed)
Urologic source ruled out at Urologist. Most likely acute diverticulitis, given very typical symptoms, will treat emperically... Allergic to cipro and  Flagyl .Marland Kitchen Will treat with augmentin x 10 days.  If not responding or if worse.. Pt to call, follow up in 1 week for re-eval.

## 2012-12-13 ENCOUNTER — Ambulatory Visit: Payer: Medicare Other | Admitting: Family Medicine

## 2013-01-24 ENCOUNTER — Telehealth: Payer: Self-pay | Admitting: Family Medicine

## 2013-01-24 ENCOUNTER — Other Ambulatory Visit (INDEPENDENT_AMBULATORY_CARE_PROVIDER_SITE_OTHER): Payer: Medicare Other

## 2013-01-24 DIAGNOSIS — E7800 Pure hypercholesterolemia, unspecified: Secondary | ICD-10-CM

## 2013-01-24 DIAGNOSIS — E291 Testicular hypofunction: Secondary | ICD-10-CM

## 2013-01-24 LAB — COMPREHENSIVE METABOLIC PANEL
ALT: 14 U/L (ref 0–53)
AST: 17 U/L (ref 0–37)
Albumin: 4.1 g/dL (ref 3.5–5.2)
CO2: 28 mEq/L (ref 19–32)
Calcium: 9.5 mg/dL (ref 8.4–10.5)
Chloride: 107 mEq/L (ref 96–112)
GFR: 72.23 mL/min (ref >60.00)
Potassium: 4.8 mEq/L (ref 3.5–5.1)
Sodium: 140 mEq/L (ref 135–145)
Total Protein: 7.2 g/dL (ref 6.0–8.3)

## 2013-01-24 LAB — LIPID PANEL
HDL: 35.7 mg/dL — ABNORMAL LOW (ref >39.00)
Total CHOL/HDL Ratio: 5

## 2013-01-24 NOTE — Telephone Encounter (Signed)
Message copied by Excell Seltzer on Tue Jan 24, 2013  8:27 AM ------      Message from: Alvina Chou      Created: Wed Jan 11, 2013  4:15 PM      Regarding: Lab orders for Tuesday,6.3.14       Patient is scheduled for CPX labs, please order future labs, Thanks , Terri       ------

## 2013-01-27 ENCOUNTER — Ambulatory Visit (INDEPENDENT_AMBULATORY_CARE_PROVIDER_SITE_OTHER): Payer: Medicare Other | Admitting: Family Medicine

## 2013-01-27 ENCOUNTER — Encounter: Payer: Self-pay | Admitting: Family Medicine

## 2013-01-27 VITALS — BP 124/60 | HR 84 | Temp 97.5°F | Ht 69.0 in | Wt 211.0 lb

## 2013-01-27 DIAGNOSIS — E291 Testicular hypofunction: Secondary | ICD-10-CM

## 2013-01-27 DIAGNOSIS — I10 Essential (primary) hypertension: Secondary | ICD-10-CM

## 2013-01-27 DIAGNOSIS — Z0000 Encounter for general adult medical examination without abnormal findings: Secondary | ICD-10-CM

## 2013-01-27 DIAGNOSIS — E781 Pure hyperglyceridemia: Secondary | ICD-10-CM

## 2013-01-27 DIAGNOSIS — E7800 Pure hypercholesterolemia, unspecified: Secondary | ICD-10-CM

## 2013-01-27 DIAGNOSIS — R413 Other amnesia: Secondary | ICD-10-CM | POA: Insufficient documentation

## 2013-01-27 LAB — CBC WITH DIFFERENTIAL/PLATELET
Eosinophils Relative: 4.9 % (ref 0.0–5.0)
Lymphocytes Relative: 22.2 % (ref 12.0–46.0)
MCV: 87.8 fl (ref 78.0–100.0)
Monocytes Absolute: 0.4 10*3/uL (ref 0.1–1.0)
Neutrophils Relative %: 64.6 % (ref 43.0–77.0)
Platelets: 228 10*3/uL (ref 150.0–400.0)
WBC: 5.4 10*3/uL (ref 4.5–10.5)

## 2013-01-27 LAB — TSH: TSH: 0.62 u[IU]/mL (ref 0.35–5.50)

## 2013-01-27 LAB — VITAMIN B12: Vitamin B-12: 369 pg/mL (ref 211–911)

## 2013-01-27 MED ORDER — TERBINAFINE HCL 250 MG PO TABS: 250.0000 mg | ORAL_TABLET | Freq: Every day | ORAL | Status: DC

## 2013-01-27 MED ORDER — TESTOSTERONE 20.25 MG/ACT (1.62%) TD GEL: 2.0000 "application " | Freq: Every day | TRANSDERMAL | Status: DC

## 2013-01-27 NOTE — Assessment & Plan Note (Signed)
Well controlled. Continue current medication.  

## 2013-01-27 NOTE — Patient Instructions (Addendum)
Work on [employer] changes and continue regular exercise.  Stop at lab on your way out.

## 2013-01-27 NOTE — Assessment & Plan Note (Signed)
Level checked by uro in last year.  Refilled.

## 2013-01-27 NOTE — Progress Notes (Signed)
Subjective:    Patient ID: Jack Norris, male    DOB: 1941/01/03, 72 y.o.   MRN: [id number]  HPI The patient is here for annual wellness exam and preventative care.    Hypertension:    At goal on lisinopril Using medication without problems or lightheadedness: None Chest pain with exertion: None Edema:None Short of breath:None Average home BPs: 130/80 Other issues:  Elevated Cholesterol: LDL above goal < 130 on gemfibrozil and niacin. Lab Results  Component Value Date   CHOL 190 01/24/2013   HDL 35.70* 01/24/2013   LDLCALC 137* 01/24/2013   LDLDIRECT 107.6 12/14/2011   TRIG 88.0 01/24/2013   CHOLHDL 5 01/24/2013  Using medications without problems: Muscle aches: None Diet compliance: moderate Exercise: walking everyday. Other complaints:  Wt Readings from Last 3 Encounters:  01/27/13 211 lb (95.709 kg)  12/06/12 214 lb 8 oz (97.297 kg)  10/25/12 215 lb (97.523 kg)   Requests refill of lamisil for toenail infeciton in great toes.  Has noted some short term memory loss, mild. MMSE 30 today. Animal recall 14/15 in 30 sec.  History   Social History  . Marital Status: Married    Spouse Name: N/A    Number of Children: 3  . Years of Education: N/A   Occupational History  . 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 Smoking status: Former Smoker -- 8 years    Types: Cigarettes    Quit date: 01/05/1967  . Smokeless tobacco: Never Used     Comment: QUIT AGE 53  . Alcohol Use: Yes     Comment: OCCASIONAL  . Drug Use: No  . Sexually Active: None   Other Topics Concern  . None   Social History Narrative   regular exercise    Full code, (Reviewed 2014)      Review of Systems  Constitutional: Negative for fever and fatigue.  HENT: Negative for ear pain.   Eyes: Negative for pain.  Respiratory: Negative for shortness of breath.   Cardiovascular: Negative for chest pain, palpitations and leg swelling.  Gastrointestinal: Negative for  abdominal pain.  Endocrine: Negative for polyuria.  Genitourinary: Negative for hematuria.       Objective:   Physical Exam  Constitutional: He is oriented to person, place, and time. Vital signs are normal. He appears well-developed and well-nourished.  HENT:  Head: Normocephalic.  Right Ear: Hearing normal.  Left Ear: Hearing normal.  Nose: Nose normal.  Mouth/Throat: Oropharynx is clear and moist and mucous membranes are normal.  Neck: Trachea normal. Carotid bruit is not present. No mass and no thyromegaly present.  Cardiovascular: Normal rate, regular rhythm and normal pulses.  Exam reveals no gallop, no distant heart sounds and no friction rub.   No murmur heard. No peripheral edema  Pulmonary/Chest: Effort normal and breath sounds normal. No respiratory distress.  Neurological: He is alert and oriented to person, place, and time. He has normal reflexes. He displays normal reflexes. No cranial nerve deficit. Coordination normal.  Skin: Skin is warm, dry and intact. No rash noted.  Psychiatric: He has a normal mood and affect. His speech is normal and behavior is normal. Thought content normal.          Assessment & Plan:   The patient's preventative maintenance and recommended screening tests for an annual wellness exam were reviewed in full today. Brought up to date unless services declined.  Counselled on the importance of diet, exercise, and its role in overall  health and mortality. The patient's FH and SH was reviewed, including their home life, tobacco status, and drug and alcohol status.   Vaccines: Uptodate with shingles, PNA and Tdap  Colon: 10/2005, [facility], nml, plan every 10 years.  Prostate: per uro. PSA nml on 06/2012 labs Former smoker, remotely

## 2013-01-27 NOTE — Assessment & Plan Note (Signed)
Borderline control... Work on lifestyle change and continue current meds.

## 2013-01-27 NOTE — Assessment & Plan Note (Signed)
MMSE 30/30 Will eval with labs. Follow over time. Encouraged exercise,staying active and mind memory games.  No family history of alzheimer's.

## 2013-01-28 LAB — VITAMIN D 25 HYDROXY (VIT D DEFICIENCY, FRACTURES): Vit D, 25-Hydroxy: 63 ng/mL (ref 30–89)

## 2013-01-31 ENCOUNTER — Encounter: Payer: Medicare Other | Admitting: Family Medicine

## 2013-01-31 ENCOUNTER — Other Ambulatory Visit: Payer: Self-pay | Admitting: Family Medicine

## 2013-02-28 ENCOUNTER — Telehealth: Payer: Self-pay

## 2013-02-28 NOTE — Telephone Encounter (Signed)
Prior auth needed for Androgel 1.62 % gel pump; form in Dr Daphine Deutscher in box.

## 2013-03-02 ENCOUNTER — Other Ambulatory Visit: Payer: Self-pay | Admitting: *Deleted

## 2013-03-02 MED ORDER — GEMFIBROZIL 600 MG PO TABS: 600.0000 mg | ORAL_TABLET | Freq: Every day | ORAL | Status: DC

## 2013-03-09 NOTE — Telephone Encounter (Signed)
In your inbox for signature.

## 2013-03-10 NOTE — Telephone Encounter (Signed)
Jack Norris with [employer] left v/m Androgel approved 03/09/13 thru 03/09/14. [facility] notified and rx went thru.

## 2013-04-20 ENCOUNTER — Ambulatory Visit (INDEPENDENT_AMBULATORY_CARE_PROVIDER_SITE_OTHER): Payer: Medicare Other | Admitting: Family Medicine

## 2013-04-20 ENCOUNTER — Encounter: Payer: Self-pay | Admitting: Family Medicine

## 2013-04-20 VITALS — BP 118/66 | HR 78 | Temp 98.2°F | Ht 69.0 in | Wt 218.2 lb

## 2013-04-20 DIAGNOSIS — K5732 Diverticulitis of large intestine without perforation or abscess without bleeding: Secondary | ICD-10-CM | POA: Insufficient documentation

## 2013-04-20 DIAGNOSIS — K5792 Diverticulitis of intestine, part unspecified, without perforation or abscess without bleeding: Secondary | ICD-10-CM | POA: Insufficient documentation

## 2013-04-20 MED ORDER — AMOXICILLIN-POT CLAVULANATE 875-125 MG PO TABS: 1.0000 | ORAL_TABLET | Freq: Two times a day (BID) | ORAL | Status: DC

## 2013-04-20 NOTE — Progress Notes (Signed)
Augmentin sent to wrong pharmacy.  [facility] and cancelled with [employer].

## 2013-04-20 NOTE — Patient Instructions (Addendum)
Complete Augmentin twice daily x 10 days. Call if pain not improving in 48 hours, go to ER if fever or severe pain. Follow up early next week on Tuesday.

## 2013-04-20 NOTE — Assessment & Plan Note (Signed)
Allergic to cipro an flagyl... Treat with augmentin with close follow up.

## 2013-04-20 NOTE — Progress Notes (Signed)
  Subjective:    Patient ID: Jack Norris, male    DOB: 08/19/1941, 72 y.o.   MRN: [id number]  HPI  [age] year old male with history of diverticulitis presents with episode of lower central abdominal tenderness 5 days ago.Marland Kitchen Awoke him from sleep.  Sharp pain  Intermittant. Improved by AM.  Soreness never went away until yesterday and today.  only pain he has is mild ttp in lower abdomen today.  No N/V/D/C no fever  No dysuria, no hematuria, no blood in stool.  Last diverticulitis flare earlier this year in 11/2012. Last colonoscopy 7 years ago, nml, no polyps.   Allergic to cipro, flagyl.. Tolerates augmentin.     Review of Systems  Constitutional: Negative for fever and fatigue.  HENT: Negative for ear pain.   Eyes: Negative for pain.  Respiratory: Negative for shortness of breath.   Cardiovascular: Negative for chest pain.  Gastrointestinal: Positive for abdominal pain.       Objective:   Physical Exam  Constitutional: Vital signs are normal. He appears well-developed and well-nourished.  HENT:  Head: Normocephalic.  Right Ear: Hearing normal.  Left Ear: Hearing normal.  Nose: Nose normal.  Mouth/Throat: Oropharynx is clear and moist and mucous membranes are normal.  Neck: Trachea normal. Carotid bruit is not present. No mass and no thyromegaly present.  Cardiovascular: Normal rate, regular rhythm and normal pulses.  Exam reveals no gallop, no distant heart sounds and no friction rub.   No murmur heard. No peripheral edema  Pulmonary/Chest: Effort normal and breath sounds normal. No respiratory distress.  Abdominal: There is no hepatosplenomegaly. There is tenderness in the suprapubic area and left lower quadrant. There is guarding. There is no rigidity, no rebound and no CVA tenderness. No hernia.  Skin: Skin is warm, dry and intact. No rash noted.  Psychiatric: He has a normal mood and affect. His speech is normal and behavior is normal. Thought content normal.           Assessment & Plan:

## 2013-04-25 ENCOUNTER — Encounter: Payer: Self-pay | Admitting: Family Medicine

## 2013-04-25 ENCOUNTER — Ambulatory Visit (INDEPENDENT_AMBULATORY_CARE_PROVIDER_SITE_OTHER): Payer: Medicare Other | Admitting: Family Medicine

## 2013-04-25 VITALS — BP 110/72 | HR 80 | Temp 98.1°F | Ht 69.0 in | Wt 217.8 lb

## 2013-04-25 DIAGNOSIS — K5732 Diverticulitis of large intestine without perforation or abscess without bleeding: Secondary | ICD-10-CM

## 2013-04-25 DIAGNOSIS — K5792 Diverticulitis of intestine, part unspecified, without perforation or abscess without bleeding: Secondary | ICD-10-CM

## 2013-04-25 NOTE — Assessment & Plan Note (Signed)
Improving, tolerating antibiotics.

## 2013-04-25 NOTE — Patient Instructions (Addendum)
Compete antibiotics and let me know if symptoms are continuing at end of course.  Go to ER if severe pain, call if fever.

## 2013-04-25 NOTE — Progress Notes (Signed)
  Subjective:    Patient ID: Jack Norris, male    DOB: [birth year], 72 y.o.   MRN: [id number]  HPI  72 year old male presents for follow up on diverticulitis... On day 4/10 of augmentin. Decreasing pain, now about 50% pain as he had before. Still some. Having some diarrhea occ from antibiotics. No N/V. No fever.  He has been having hearing loss in right ear.  Review of Systems  Constitutional: Negative for fever and fatigue.  Cardiovascular: Negative for chest pain.  Gastrointestinal: Positive for abdominal pain and diarrhea. Negative for vomiting.       Objective:   Physical Exam  Constitutional: He appears well-developed and well-nourished.  HENT:  Mouth/Throat: Oropharynx is clear and moist.  Cardiovascular: Normal rate and regular rhythm.   Pulmonary/Chest: Effort normal and breath sounds normal.  Abdominal: Soft. There is tenderness in the left lower quadrant.          Assessment & Plan:

## 2013-05-01 ENCOUNTER — Other Ambulatory Visit: Payer: Self-pay | Admitting: Family Medicine

## 2013-05-02 ENCOUNTER — Telehealth: Payer: Self-pay

## 2013-05-02 DIAGNOSIS — K5732 Diverticulitis of large intestine without perforation or abscess without bleeding: Secondary | ICD-10-CM

## 2013-05-02 DIAGNOSIS — K5792 Diverticulitis of intestine, part unspecified, without perforation or abscess without bleeding: Secondary | ICD-10-CM

## 2013-05-02 NOTE — Telephone Encounter (Signed)
Pt left v/m has completed antibiotics for diverticulitis given 04/25/13; if pt pushes on lower abdomen still has pain. Pt request GI referral (pt previously discussed with Dr Ermalene Searing). Pt request Dr Evette Cristal with [facility].Please advise.

## 2013-05-08 ENCOUNTER — Telehealth: Payer: Self-pay

## 2013-05-08 DIAGNOSIS — H8110 Benign paroxysmal vertigo, unspecified ear: Secondary | ICD-10-CM

## 2013-05-08 NOTE — Telephone Encounter (Signed)
Referral sent... Also let pt know that we can set him up with vestibular rehab... Like PT for the inner ear... if ENT does not.

## 2013-05-08 NOTE — Telephone Encounter (Signed)
Patient notified as instructed by telephone. 

## 2013-05-08 NOTE — Telephone Encounter (Signed)
Pt has had vertigo on and off for months; last night pt had another episode of vertigo with nausea. Pt request referral to ENT in [place]. [facility]; pt said E[facility]y helping. Please advise.

## 2013-05-22 ENCOUNTER — Other Ambulatory Visit: Payer: Self-pay | Admitting: Family Medicine

## 2013-05-22 NOTE — Telephone Encounter (Signed)
Last office visit 09//09/2012.  Ok to refill? 

## 2013-05-23 NOTE — Telephone Encounter (Signed)
Jack Norris notified refill has been called in to his pharmacy.

## 2013-05-23 NOTE — Telephone Encounter (Signed)
[facility] 

## 2013-05-23 NOTE — Telephone Encounter (Signed)
Please call in .Marland Kitchen Sent to [facility] by mistake.

## 2013-05-23 NOTE — Telephone Encounter (Signed)
Pt checking on status of androgel refill; pt concerned will need prior auth. Under media tab PA good thru 03/09/2014. Pt said will need refill before end of week.

## 2013-06-19 LAB — HM COLONOSCOPY

## 2013-06-27 ENCOUNTER — Encounter: Payer: Self-pay | Admitting: Family Medicine

## 2013-06-29 ENCOUNTER — Other Ambulatory Visit: Payer: Self-pay

## 2013-07-14 ENCOUNTER — Ambulatory Visit
Admission: RE | Admit: 2013-07-14 | Discharge: 2013-07-14 | Disposition: A | Discharge status: Home / Self Care | Payer: Medicare Other | Source: Ambulatory Visit | Attending: Gastroenterology | Admitting: Gastroenterology

## 2013-07-14 ENCOUNTER — Other Ambulatory Visit: Payer: Self-pay | Admitting: Gastroenterology

## 2013-07-14 DIAGNOSIS — K5792 Diverticulitis of intestine, part unspecified, without perforation or abscess without bleeding: Secondary | ICD-10-CM

## 2013-07-14 MED ORDER — IOHEXOL 300 MG/ML  SOLN
125.0000 mL | Freq: Once | INTRAMUSCULAR | Status: AC | PRN
  Administered 2013-07-14: 125 mL via INTRAVENOUS

## 2013-07-26 ENCOUNTER — Encounter: Payer: Self-pay | Admitting: Family Medicine

## 2013-07-26 ENCOUNTER — Ambulatory Visit (INDEPENDENT_AMBULATORY_CARE_PROVIDER_SITE_OTHER): Payer: Medicare Other | Admitting: Family Medicine

## 2013-07-26 VITALS — BP 122/74 | HR 89 | Temp 98.5°F | Ht 70.0 in | Wt 229.0 lb

## 2013-07-26 DIAGNOSIS — R051 Acute cough: Secondary | ICD-10-CM | POA: Insufficient documentation

## 2013-07-26 DIAGNOSIS — R059 Cough, unspecified: Secondary | ICD-10-CM

## 2013-07-26 DIAGNOSIS — Z23 Encounter for immunization: Secondary | ICD-10-CM

## 2013-07-26 DIAGNOSIS — R05 Cough: Secondary | ICD-10-CM | POA: Insufficient documentation

## 2013-07-26 MED ORDER — GUAIFENESIN-CODEINE 100-10 MG/5ML PO SYRP: 5.0000 mL | ORAL_SOLUTION | Freq: Every evening | ORAL | Status: DC | PRN

## 2013-07-26 MED ORDER — PREDNISONE 20 MG PO TABS: ORAL_TABLET | ORAL | Status: DC

## 2013-07-26 MED ORDER — ALBUTEROL SULFATE HFA 108 (90 BASE) MCG/ACT IN AERS: 2.0000 | INHALATION_SPRAY | Freq: Four times a day (QID) | RESPIRATORY_TRACT | Status: DC | PRN

## 2013-07-26 NOTE — Assessment & Plan Note (Signed)
Anticipate viral URI that has led to reactive airway flare. Treat with albuterol scheduled (updated prescription), cheratussin for cough at night, and provided with printed steroid script to fill if worsening wheezing or dypsnea. Discussed red flags to seek urgent care. Pt agrees with plan.

## 2013-07-26 NOTE — Patient Instructions (Signed)
Flu shot today. I do think you have mild upper respiratory infection that has led to a flare of reactive airways. Treat with albuterol scheduled for the next 2-3 days and codeine cough syrup at night time. If worsening wheezing or shortness of breath, fill prednisone course. If fever >101 or worsening productive cough, return to see [place].

## 2013-07-26 NOTE — Addendum Note (Signed)
Addended by: Josph Macho A on: 07/26/2013 10:05 AM   Modules accepted: Orders

## 2013-07-26 NOTE — Progress Notes (Signed)
Pre-visit discussion using our clinic review tool. No additional management support is needed unless otherwise documented below in the visit note.  

## 2013-07-26 NOTE — Progress Notes (Signed)
Patient seen and examined with PA student Ricarda Frame.  Note reviewed, agree with assessment and plan unless changes documented in my note.   CC: cough  4d h/o wheezing, dyspnea, some chest tightness with coughing and chest congestion, headache, rhinorrhea, worsening ST.  Subjective fever yesterday, chest tightness and congestion.  Cough dry, waking him up from sleep.  Using albuterol (?expired) without relief.  Also taking tylenol (1000mg  tid). No chest pain, ear pain, facial pain or pressure.  Recent diverticulitis, treating with augmentin (currently on this and culturelle).  Wife recently dx with bronchitis.  F/u with GI scheduled for tomorrow.  H/o childhood asthma, triggered by cold sxs or over exertion. Denies h/o seasonal allergies. No smokers at home. Would like flu shot today.  Past Medical History  Diagnosis Date  . Arthritis   . Hypertension   . Eczema   . BPH (benign prostatic hypertrophy)   . Reflux OCCASIONAL-  WATCHES DIET  . History of kidney stones   . Simple renal cyst right interpoler, simpler in nature , asymptomatic  per urologist note (dr dahlstedt)  12-26-2011    BENIGN  . Renal calculi right     AND LEFT X1 NON-OBSTRUCTIVE  . Asthma, mild     as child-only now if has URI  . Wears glasses      PE: GEN: NAD, WDWN CM HEENT: TMs clear, nares clear, oropharynx clear, PERRLA, EOMI CVS: nl S1, S2, no m/r/g Pulm: good air movement, no significant wheeze noted.

## 2013-07-26 NOTE — Progress Notes (Signed)
   Subjective:    Patient ID: Jack Norris, male    DOB: 03-02-1941, 72 y.o.   MRN: [id number]  HPI CC: cough and asthma flare x 4 days  Sx began Sunday with wheezing, coughing, headache, rhinorrhea, mild sore throat and have been worsening. Subjective fever yesterday. Complains of chest tightness and congestion. Cough is nonproductive and wakes him from sleep. Using Albuterol (likely expired) several times daily without much relief. Last albuterol use was yesterday. Tylenol helps headache and subjective fevers (taking 1000 mg 3 times a day). Asthma was issue in childhood, mostly outgrown. Usually triggered by cold symptoms or over exertion.  Denies facial pain or pressure, tooth pain, ear pain, chest pain.   Diagnosed with diverticulitis 2 weeks ago and on Augmentin for last 2 weeks, follows up with GI tomorrow. Some antibiotic associated diarrhea treating with Culturelle.    Wife has similar symptoms since Saturday and diagnosed with bronchitis yesterday.   Denies hx of seasonal allergies. No smokers at home. Requests flu shot today.   Review of Systems Per HPI.     Objective:   Physical Exam  Constitutional: He appears well-developed and well-nourished.  HENT:  Head: Normocephalic and atraumatic.  Right Ear: Tympanic membrane, external ear and ear canal normal.  Left Ear: Tympanic membrane, external ear and ear canal normal.  Nose: Rhinorrhea present. Right sinus exhibits no maxillary sinus tenderness and no frontal sinus tenderness. Left sinus exhibits no maxillary sinus tenderness and no frontal sinus tenderness.  Mouth/Throat: Posterior oropharyngeal erythema present. No oropharyngeal exudate, posterior oropharyngeal edema or tonsillar abscesses.  Eyes: Conjunctivae and EOM are normal. Pupils are equal, round, and reactive to light. Right eye exhibits no discharge. Left eye exhibits no discharge. No scleral icterus.  Neck: Normal range of motion. Neck supple.  Cardiovascular:  Normal rate, regular rhythm and normal heart sounds.  Exam reveals no gallop and no friction rub.   No murmur heard. Pulmonary/Chest: Effort normal. No respiratory distress. He has no wheezes.  Lymphadenopathy:    He has no cervical adenopathy.  Skin: Skin is warm. No rash noted.      Assessment & Plan:  Upper respiratory infection (suspect viral etiology). Bronchitis with associated asthma exacerbation. Will give new Rx for albuterol to use every 6 hours for the next 2-3 days. Codine cough syrup for use at bedtime.    WASP for short prednisone course if worsening wheezing or shortness of breath.  Mucinex with lots of water for next several days. Contact us if symptoms worsen, fail to improve, worsening productive cough, or fevers over 101.5.  Flu shot today.

## 2013-08-07 ENCOUNTER — Other Ambulatory Visit: Payer: Self-pay | Admitting: Family Medicine

## 2013-09-01 ENCOUNTER — Other Ambulatory Visit: Payer: Self-pay

## 2013-09-01 MED ORDER — GEMFIBROZIL 600 MG PO TABS: 600.0000 mg | ORAL_TABLET | Freq: Every day | ORAL | Status: DC

## 2013-09-01 MED ORDER — NIACIN ER (ANTIHYPERLIPIDEMIC) 1000 MG PO TBCR: 1000.0000 mg | EXTENDED_RELEASE_TABLET | Freq: Two times a day (BID) | ORAL | Status: DC

## 2013-09-01 NOTE — Telephone Encounter (Signed)
Pt left note requesting refill gemfibrozil and niacin to [facility]. Left v/m on pts phone refill done.

## 2013-09-14 ENCOUNTER — Telehealth: Payer: Self-pay

## 2013-09-14 NOTE — Telephone Encounter (Signed)
Pt left v/m; pt said Niacin CR 1000 mg is now a Tier 4 with current ins. Co and is extremely expensive. Pt request a substitute med for Niacin CR to increase HDL to [facility]. Pt request cb.

## 2013-09-15 MED ORDER — GEMFIBROZIL 600 MG PO TABS: 600.0000 mg | ORAL_TABLET | Freq: Two times a day (BID) | ORAL | Status: DC

## 2013-09-15 NOTE — Telephone Encounter (Signed)
Left message for patient to return my call.

## 2013-09-15 NOTE — Telephone Encounter (Signed)
He can increase gemfibrozil to twice daily. Change in med list and refill as needed.  Also regular exercise and eating veggie fats  ( almonds, avacado, olive oil) instead of animal fats in diet.

## 2013-09-15 NOTE — Telephone Encounter (Signed)
Jack Norris notified as instructed by telephone.  Updated on Medication List.  Prescription sent to [facility] with new directions and quantity.

## 2013-09-20 MED ORDER — GEMFIBROZIL 600 MG PO TABS: 600.0000 mg | ORAL_TABLET | Freq: Two times a day (BID) | ORAL | Status: DC

## 2013-09-20 NOTE — Addendum Note (Signed)
Addended by: Carter Kitten on: 09/20/2013 03:12 PM   Modules accepted: Orders

## 2014-01-23 ENCOUNTER — Other Ambulatory Visit: Payer: Medicare Other

## 2014-01-23 ENCOUNTER — Telehealth: Payer: Self-pay | Admitting: Family Medicine

## 2014-01-23 DIAGNOSIS — E291 Testicular hypofunction: Secondary | ICD-10-CM

## 2014-01-23 DIAGNOSIS — E7800 Pure hypercholesterolemia, unspecified: Secondary | ICD-10-CM

## 2014-01-23 NOTE — Telephone Encounter (Signed)
Message copied by Jinny Sanders on Tue Jan 23, 2014  7:31 AM ------      Message from: Marchia Bond      Created: Thu Jan 18, 2014  5:44 PM      Regarding: Cpx labs 01/23/14       Please order  future cpx labs for pt's upcoming lab appt.      Thanks      Tasha       ------

## 2014-01-30 ENCOUNTER — Other Ambulatory Visit (INDEPENDENT_AMBULATORY_CARE_PROVIDER_SITE_OTHER): Payer: Medicare HMO

## 2014-01-30 ENCOUNTER — Encounter: Payer: Medicare Other | Admitting: Family Medicine

## 2014-01-30 DIAGNOSIS — E7800 Pure hypercholesterolemia, unspecified: Secondary | ICD-10-CM

## 2014-01-30 DIAGNOSIS — I10 Essential (primary) hypertension: Secondary | ICD-10-CM

## 2014-01-30 LAB — COMPREHENSIVE METABOLIC PANEL
ALT: 16 U/L (ref 0–53)
AST: 23 U/L (ref 0–37)
Albumin: 4.3 g/dL (ref 3.5–5.2)
Alkaline Phosphatase: 89 U/L (ref 39–117)
BUN: 20 mg/dL (ref 6–23)
CALCIUM: 9.5 mg/dL (ref 8.4–10.5)
CHLORIDE: 106 meq/L (ref 96–112)
CO2: 24 meq/L (ref 19–32)
CREATININE: 1.2 mg/dL (ref 0.4–1.5)
GFR: 66.28 mL/min (ref >60.00)
Glucose, Bld: 88 mg/dL (ref 70–99)
Potassium: 4.4 mEq/L (ref 3.5–5.1)
Sodium: 139 mEq/L (ref 135–145)
Total Bilirubin: 0.8 mg/dL (ref 0.2–1.2)
Total Protein: 7.6 g/dL (ref 6.0–8.3)

## 2014-01-30 LAB — LIPID PANEL
Cholesterol: 184 mg/dL (ref 0–200)
HDL: 35.4 mg/dL — AB (ref >39.00)
LDL Cholesterol: 129 mg/dL — ABNORMAL HIGH (ref 0–99)
NonHDL: 148.6
TRIGLYCERIDES: 97 mg/dL (ref 0.0–149.0)
Total CHOL/HDL Ratio: 5
VLDL: 19.4 mg/dL (ref 0.0–40.0)

## 2014-02-01 ENCOUNTER — Other Ambulatory Visit: Payer: Self-pay | Admitting: Family Medicine

## 2014-02-06 ENCOUNTER — Ambulatory Visit (INDEPENDENT_AMBULATORY_CARE_PROVIDER_SITE_OTHER): Payer: Medicare HMO | Admitting: Family Medicine

## 2014-02-06 ENCOUNTER — Encounter: Payer: Self-pay | Admitting: Family Medicine

## 2014-02-06 VITALS — BP 126/74 | HR 74 | Temp 98.3°F | Ht 69.5 in | Wt 215.2 lb

## 2014-02-06 DIAGNOSIS — E781 Pure hyperglyceridemia: Secondary | ICD-10-CM

## 2014-02-06 DIAGNOSIS — R413 Other amnesia: Secondary | ICD-10-CM

## 2014-02-06 DIAGNOSIS — I10 Essential (primary) hypertension: Secondary | ICD-10-CM

## 2014-02-06 DIAGNOSIS — K219 Gastro-esophageal reflux disease without esophagitis: Secondary | ICD-10-CM

## 2014-02-06 DIAGNOSIS — Z0000 Encounter for general adult medical examination without abnormal findings: Secondary | ICD-10-CM

## 2014-02-06 NOTE — Assessment & Plan Note (Signed)
Well controlled. Continue current medication.  

## 2014-02-06 NOTE — Assessment & Plan Note (Signed)
2 week course of prilosec

## 2014-02-06 NOTE — Progress Notes (Signed)
The patient is here for annual wellness exam and preventative care.   He has had diverticulitis 3-4 times in last year. Followed by Dr. Penelope Coop at [facility].  he is considering seeing a Psychologist, sport and exercise for definitive treatment if occurs again.  He responds to Augmentin.  Hypertension: At goal on lisinopril  BP Readings from Last 3 Encounters:  02/06/14 126/74  07/26/13 122/74  04/25/13 110/72  Using medication without problems or lightheadedness: None  Chest pain with exertion: None  Edema:None  Short of breath:None  Average home BPs: 130/80  Other issues:   Elevated Cholesterol: LDL at goal < 130 on gemfibrozil. He has stopped niacin due to cost. Lab Results  Component Value Date   CHOL 184 01/30/2014   HDL 35.40* 01/30/2014   LDLCALC 129* 01/30/2014   LDLDIRECT 107.6 12/14/2011   TRIG 97.0 01/30/2014   CHOLHDL 5 01/30/2014  Using medications without problems:  Muscle aches: None  Diet compliance: moderate  Exercise: walking 3-4 times a week  Other complaints:  Wt Readings from Last 3 Encounters:  02/06/14 215 lb 4 oz (97.637 kg)  07/26/13 229 lb (103.874 kg)  04/25/13 217 lb 12 oz (98.771 kg)   At last OV noted some short term memory loss, mild.  MMSE 30 last year Animal recall 14/15 in 30 sec.  Labs were normal 2015. Today animal recall 13/15 in 30 sec.  He has mild daily GERD and daily cough from acid.  History    Social History   .  Marital Status:  Married     Spouse Name:  N/A     Number of Children:  3   .  Years of Education:  N/A    Occupational History   .  QUOTATION ENGINEER  [employer]    Social History Main Topics   .  Smoking status:  Former Smoker -- 8 years     Types:  Cigarettes     Quit date:  01/05/1967   .  Smokeless tobacco:  Never Used      Comment: QUIT AGE 68   .  Alcohol Use:  Yes      Comment: OCCASIONAL   .  Drug Use:  No   .  Sexually Active:  None    Other Topics  Concern   .  None    Social History Narrative    regular exercise     Full code, (Reviewed 2015)   Review of Systems  Constitutional: Negative for fever and fatigue.  HENT: Negative for ear pain.  Eyes: Negative for pain.  Respiratory: Negative for shortness of breath.  Cardiovascular: Negative for chest pain, palpitations and leg swelling.  Gastrointestinal: Negative for abdominal pain.  Endocrine: Negative for polyuria.  Genitourinary: Negative for hematuria.  Objective:   Physical Exam  Constitutional: He is oriented to person, place, and time. Vital signs are normal. He appears well-developed and well-nourished.  HENT:  Head: Normocephalic.  Right Ear: Hearing normal.  Left Ear: Hearing normal.  Nose: Nose normal.  Mouth/Throat: Oropharynx is clear and moist and mucous membranes are normal.  Neck: Trachea normal. Carotid bruit is not present. No mass and no thyromegaly present.  Cardiovascular: Normal rate, regular rhythm and normal pulses. Exam reveals no gallop, no distant heart sounds and no friction rub.  No murmur heard. No peripheral edema  Pulmonary/Chest: Effort normal and breath sounds normal. No respiratory distress.  Neurological: He is alert and oriented to person, place, and time. He has normal reflexes. He  displays normal reflexes. No cranial nerve deficit. Coordination normal.  Skin: Skin is warm, dry and intact. No rash noted.  Psychiatric: He has a normal mood and affect. His speech is normal and behavior is normal. Thought content normal.  Assessment & Plan:   The patient's preventative maintenance and recommended screening tests for an annual wellness exam were reviewed in full today.  Brought up to date unless services declined.  Counselled on the importance of diet, exercise, and its role in overall health and mortality.  The patient's FH and SH was reviewed, including their home life, tobacco status, and drug and alcohol status.   Vaccines: Uptodate with shingles, PNA and Tdap  Colon: 10/2005, [facility], nml, plan every 10 years.   Prostate: per uro. PSA nml on 08/2013 labs  Former smoker, remotely

## 2014-02-06 NOTE — Progress Notes (Signed)
Pre visit review using our clinic review tool, if applicable. No additional management support is needed unless otherwise documented below in the visit note. 

## 2014-02-06 NOTE — Patient Instructions (Addendum)
Prilosec 20 mg daily x 2 weeks. Avoid triggers, alcohol, tomato, spicy, chocolate, caffeine etc. Look into audiology eval.  Keep working on healthy eating, exercise and weight loss.

## 2014-02-07 ENCOUNTER — Telehealth: Payer: Self-pay | Admitting: Family Medicine

## 2014-02-07 NOTE — Telephone Encounter (Signed)
Relevant patient education assigned to patient using Emmi. ° °

## 2014-04-03 ENCOUNTER — Encounter (HOSPITAL_COMMUNITY): Payer: Self-pay | Admitting: Emergency Medicine

## 2014-04-03 ENCOUNTER — Emergency Department (HOSPITAL_COMMUNITY): Payer: No Typology Code available for payment source

## 2014-04-03 ENCOUNTER — Emergency Department (HOSPITAL_COMMUNITY)
Admission: EM | Admit: 2014-04-03 | Discharge: 2014-04-04 | Disposition: A | Discharge status: Home / Self Care | Payer: No Typology Code available for payment source | Attending: Emergency Medicine | Admitting: Emergency Medicine

## 2014-04-03 DIAGNOSIS — Z87891 Personal history of nicotine dependence: Secondary | ICD-10-CM | POA: Diagnosis not present

## 2014-04-03 DIAGNOSIS — IMO0002 Reserved for concepts with insufficient information to code with codable children: Secondary | ICD-10-CM | POA: Diagnosis present

## 2014-04-03 DIAGNOSIS — Z872 Personal history of diseases of the skin and subcutaneous tissue: Secondary | ICD-10-CM | POA: Diagnosis not present

## 2014-04-03 DIAGNOSIS — Y9389 Activity, other specified: Secondary | ICD-10-CM | POA: Diagnosis not present

## 2014-04-03 DIAGNOSIS — Z87442 Personal history of urinary calculi: Secondary | ICD-10-CM | POA: Insufficient documentation

## 2014-04-03 DIAGNOSIS — Z7982 Long term (current) use of aspirin: Secondary | ICD-10-CM | POA: Insufficient documentation

## 2014-04-03 DIAGNOSIS — M129 Arthropathy, unspecified: Secondary | ICD-10-CM | POA: Diagnosis not present

## 2014-04-03 DIAGNOSIS — Y92410 Unspecified street and highway as the place of occurrence of the external cause: Secondary | ICD-10-CM | POA: Diagnosis not present

## 2014-04-03 DIAGNOSIS — I10 Essential (primary) hypertension: Secondary | ICD-10-CM | POA: Insufficient documentation

## 2014-04-03 DIAGNOSIS — J45909 Unspecified asthma, uncomplicated: Secondary | ICD-10-CM | POA: Diagnosis not present

## 2014-04-03 DIAGNOSIS — M545 Low back pain: Secondary | ICD-10-CM

## 2014-04-03 DIAGNOSIS — Z79899 Other long term (current) drug therapy: Secondary | ICD-10-CM | POA: Insufficient documentation

## 2014-04-03 DIAGNOSIS — S335XXA Sprain of ligaments of lumbar spine, initial encounter: Secondary | ICD-10-CM | POA: Diagnosis not present

## 2014-04-03 DIAGNOSIS — Z87448 Personal history of other diseases of urinary system: Secondary | ICD-10-CM | POA: Insufficient documentation

## 2014-04-03 DIAGNOSIS — S39012A Strain of muscle, fascia and tendon of lower back, initial encounter: Secondary | ICD-10-CM
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
## 2014-04-04 ENCOUNTER — Telehealth: Payer: Self-pay
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
## 2014-04-04 NOTE — ED Notes (Signed)
See Paper Charting

## 2014-04-04 NOTE — Discharge Instructions (Signed)
Please call your doctor for a followup appointment within 24-48 hours. When you talk to your doctor please let them know that you were seen in the emergency department and have them acquire all of your records so that they can discuss the findings with you and formulate a treatment plan to fully care for your new and ongoing problems. Please call and set-up an appointment with orthopedics Please rest, heat, massage Please continue to monitor symptoms closely and if symptoms are to worsen or change please report back to the ED immediately   Back Pain, Adult Low back pain is very common. About 1 in 5 people have back pain.The cause of low back pain is rarely dangerous. The pain often gets better over time.About half of people with a sudden onset of back pain feel better in just 2 weeks. About 8 in 10 people feel better by 6 weeks.  CAUSES Some common causes of back pain include:  Strain of the muscles or ligaments supporting the spine.  Wear and tear (degeneration) of the spinal discs.  Arthritis.  Direct injury to the back. DIAGNOSIS Most of the time, the direct cause of low back pain is not known.However, back pain can be treated effectively even when the exact cause of the pain is unknown.Answering your caregiver's questions about your overall health and symptoms is one of the most accurate ways to make sure the cause of your pain is not dangerous. If your caregiver needs more information, he or she may order lab work or imaging tests (X-rays or MRIs).However, even if imaging tests show changes in your back, this usually does not require surgery. HOME CARE INSTRUCTIONS For many people, back pain returns.Since low back pain is rarely dangerous, it is often a condition that people can learn to [facility] their own.   Remain active. It is stressful on the back to sit or stand in one place. Do not sit, drive, or stand in one place for more than 30 minutes at a time. Take short walks on level  surfaces as soon as pain allows.Try to increase the length of time you walk each day.  Do not stay in bed.Resting more than 1 or 2 days can delay your recovery.  Do not avoid exercise or work.Your body is made to move.It is not dangerous to be active, even though your back may hurt.Your back will likely heal faster if you return to being active before your pain is gone.  Pay attention to your body when you bend and lift. Many people have less discomfortwhen lifting if they bend their knees, keep the load close to their bodies,and avoid twisting. Often, the most comfortable positions are those that put less stress on your recovering back.  Find a comfortable position to sleep. Use a firm mattress and lie on your side with your knees slightly bent. If you lie on your back, put a pillow under your knees.  Only take over-the-counter or prescription medicines as directed by your caregiver. Over-the-counter medicines to reduce pain and inflammation are often the most helpful.Your caregiver may prescribe muscle relaxant drugs.These medicines help dull your pain so you can more quickly return to your normal activities and healthy exercise.  Put ice on the injured area.  Put ice in a plastic bag.  Place a towel between your skin and the bag.  Leave the ice on for 15-20 minutes, 03-04 times a day for the first 2 to 3 days. After that, ice and heat may be alternated to  reduce pain and spasms.  Ask your caregiver about trying back exercises and gentle massage. This may be of some benefit.  Avoid feeling anxious or stressed.Stress increases muscle tension and can worsen back pain.It is important to recognize when you are anxious or stressed and learn ways to manage it.Exercise is a great option. SEEK MEDICAL CARE IF:  You have pain that is not relieved with rest or medicine.  You have pain that does not improve in 1 week.  You have new symptoms.  You are generally not feeling  well. SEEK IMMEDIATE MEDICAL CARE IF:   You have pain that radiates from your back into your legs.  You develop new bowel or bladder control problems.  You have unusual weakness or numbness in your arms or legs.  You develop nausea or vomiting.  You develop abdominal pain.  You feel faint. Document Released: 08/10/2005 Document Revised: 02/09/2012 Document Reviewed: 12/12/2013 [facility] Patient Information 2015 [place], [place]. This information is not intended to replace advice given to you by your health care provider. Make sure you discuss any questions you have with your health care provider.

## 2014-04-05 NOTE — Telephone Encounter (Signed)
Noted  
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
## 2014-04-23 ENCOUNTER — Other Ambulatory Visit: Payer: Self-pay | Admitting: *Deleted

## 2014-04-23 MED ORDER — GEMFIBROZIL 600 MG PO TABS: 600.0000 mg | ORAL_TABLET | Freq: Two times a day (BID) | ORAL | Status: DC

## 2014-06-12 ENCOUNTER — Ambulatory Visit (INDEPENDENT_AMBULATORY_CARE_PROVIDER_SITE_OTHER): Payer: Commercial Managed Care - HMO | Admitting: Family Medicine

## 2014-06-12 ENCOUNTER — Encounter: Payer: Self-pay | Admitting: Family Medicine

## 2014-06-12 ENCOUNTER — Ambulatory Visit (INDEPENDENT_AMBULATORY_CARE_PROVIDER_SITE_OTHER)
Admission: RE | Admit: 2014-06-12 | Discharge: 2014-06-12 | Disposition: A | Discharge status: Home / Self Care | Payer: Commercial Managed Care - HMO | Source: Ambulatory Visit | Attending: Family Medicine | Admitting: Family Medicine

## 2014-06-12 VITALS — BP 120/70 | HR 63 | Temp 98.1°F | Ht 69.5 in | Wt 205.5 lb

## 2014-06-12 DIAGNOSIS — M25562 Pain in left knee: Secondary | ICD-10-CM

## 2014-06-12 DIAGNOSIS — Z23 Encounter for immunization: Secondary | ICD-10-CM

## 2014-06-12 DIAGNOSIS — R040 Epistaxis: Secondary | ICD-10-CM | POA: Insufficient documentation

## 2014-06-12 MED ORDER — MELOXICAM 15 MG PO TABS: 15.0000 mg | ORAL_TABLET | Freq: Every day | ORAL | Status: DC

## 2014-06-12 NOTE — Addendum Note (Signed)
Addended by: Modena Nunnery on: 06/12/2014 01:12 PM   Modules accepted: Orders

## 2014-06-12 NOTE — Patient Instructions (Addendum)
Hold ibuprofen. Use meloxicam for pain and swelling in left knee.  Ice, elevated, can use pull on brace as needed. We will call with X-rays.  If not improving in 2 weeks follow up.

## 2014-06-12 NOTE — Assessment & Plan Note (Signed)
Due to NSAIDS, ASA and blood vessels close to surface in nose.  Will try to limit NSAIDs as able. If continuing consider ENT for cautery.

## 2014-06-12 NOTE — Progress Notes (Signed)
   Subjective:    Patient ID: Jack Norris, male    DOB: 1941/07/20, 73 y.o.   MRN: [id number]  HPI  73 year old male presents with new onset pain in left knee in last several months after walking . Last week he noted swelling in left knee as well as pain after walking a golf course.  Decrease ROM. Pain greatest with bending.  No trauma or falls.  Using ibuprofen 800 mg daily in AM. Helps with pain.  He feels like left knee is unstable with twisting foot. No clicking, no popping.  Never had X-ray in knee. No past knee surgeries.  Has lost weight with weight watchers.  Wt Readings from Last 3 Encounters:  06/12/14 205 lb 8 oz (93.214 kg)  04/03/14 210 lb (95.255 kg)  02/06/14 215 lb 4 oz (97.637 kg)     Review of Systems  Constitutional: Negative for fever and fatigue.  HENT: Negative for ear pain.   Eyes: Negative for pain.  Respiratory: Negative for shortness of breath.   Cardiovascular: Negative for chest pain.  Musculoskeletal:       Occ low back pain intermittent , radiates down right leg.       Objective:   Physical Exam  Constitutional: Vital signs are normal. He appears well-developed and well-nourished.  HENT:  Head: Normocephalic.  Right Ear: Hearing normal.  Left Ear: Hearing normal.  Nose: No epistaxis.  Mouth/Throat: Oropharynx is clear and moist and mucous membranes are normal.  Scab on septum right, blood vessels close to surface.  Neck: Trachea normal. Carotid bruit is not present. No mass and no thyromegaly present.  Cardiovascular: Normal rate, regular rhythm and normal pulses.  Exam reveals no gallop, no distant heart sounds and no friction rub.   No murmur heard. No peripheral edema  Pulmonary/Chest: Effort normal and breath sounds normal. No respiratory distress.  Musculoskeletal:       Right knee: Normal.       Left knee: He exhibits decreased range of motion, swelling and effusion. He exhibits no bony tenderness, normal meniscus and no  MCL laxity. Tenderness found. Medial joint line and lateral joint line tenderness noted. No MCL, no LCL and no patellar tendon tenderness noted.  Crepitus in B knees Neg Mcmurray's B.  Skin: Skin is warm, dry and intact. No rash noted.  Psychiatric: He has a normal mood and affect. His speech is normal and behavior is normal. Thought content normal.          Assessment & Plan:

## 2014-06-12 NOTE — Assessment & Plan Note (Signed)
X-ray , likely OA in joint. Start NSAIDS, ice, elevation and brace prn.  Follow up if not improving in 2 weeks.

## 2014-06-12 NOTE — Progress Notes (Signed)
Pre visit review using our clinic review tool, if applicable. No additional management support is needed unless otherwise documented below in the visit note. 

## 2014-06-14 ENCOUNTER — Other Ambulatory Visit: Payer: Self-pay | Admitting: Family Medicine

## 2014-06-14 DIAGNOSIS — M25562 Pain in left knee: Secondary | ICD-10-CM

## 2014-06-18 ENCOUNTER — Telehealth: Payer: Self-pay | Admitting: Family Medicine

## 2014-06-18 DIAGNOSIS — M25562 Pain in left knee: Secondary | ICD-10-CM

## 2014-06-18 NOTE — Telephone Encounter (Signed)
Pt is requesting a referral to Dr Thea Alken for left knee. Pt has [employer].

## 2014-07-25 ENCOUNTER — Other Ambulatory Visit: Payer: Self-pay | Admitting: Family Medicine

## 2014-10-04 ENCOUNTER — Ambulatory Visit (INDEPENDENT_AMBULATORY_CARE_PROVIDER_SITE_OTHER): Payer: Commercial Managed Care - HMO | Admitting: Nurse Practitioner

## 2014-10-04 ENCOUNTER — Encounter: Payer: Self-pay | Admitting: Nurse Practitioner

## 2014-10-04 ENCOUNTER — Ambulatory Visit: Payer: Commercial Managed Care - HMO | Admitting: Family Medicine

## 2014-10-04 VITALS — BP 130/76 | HR 96 | Temp 98.2°F | Resp 12 | Ht 69.5 in | Wt 202.0 lb

## 2014-10-04 DIAGNOSIS — K5792 Diverticulitis of intestine, part unspecified, without perforation or abscess without bleeding: Secondary | ICD-10-CM | POA: Diagnosis not present

## 2014-10-04 MED ORDER — AMOXICILLIN-POT CLAVULANATE 875-125 MG PO TABS: 1.0000 | ORAL_TABLET | Freq: Two times a day (BID) | ORAL | Status: DC

## 2014-10-04 NOTE — Assessment & Plan Note (Addendum)
Pt having LLQ pain, worsening. Pt leaving for [facility] tomorrow. Augmentin script for twice daily x 10 days. Asked pt to start it. Gave handout with information regarding when to seek immediate medical care and reviewed with pt. He verbalized understanding.

## 2014-10-04 NOTE — Patient Instructions (Signed)

## 2014-10-04 NOTE — Progress Notes (Signed)
Pre visit review using our clinic review tool, if applicable. No additional management support is needed unless otherwise documented below in the visit note. 

## 2014-10-04 NOTE — Progress Notes (Signed)
   Subjective:    Patient ID: Jack Norris, male    DOB: 06/04/1941, 74 y.o.   MRN: [id number]  HPI  Jack Norris is a 74 yo male with a CC of left lower quadrant pain.   1) 2 flare ups a year or two ago now.  Uncomfortable at nighttime, okay in the daytime Patient is leaving for [place] tomorrow and is concerned about flare ups.  Pt is allergic to Cipro and Flagyl and takes Augmentin twice daily for 10 days for his flare ups.   Review of Systems  Constitutional: Positive for fever. Negative for chills, diaphoresis and fatigue.  Eyes: Negative for visual disturbance.  Respiratory: Negative for chest tightness, shortness of breath and wheezing.   Cardiovascular: Negative for chest pain, palpitations and leg swelling.  Gastrointestinal: Positive for abdominal pain. Negative for nausea, vomiting, diarrhea, constipation and abdominal distention.  Genitourinary: Negative for dysuria and flank pain.       Sees urology  Skin: Negative for rash.       Objective:   Physical Exam  Constitutional: He is oriented to person, place, and time. He appears well-developed and well-nourished. No distress.  BP 130/76 mmHg  Pulse 96  Temp(Src) 98.2 F (36.8 C) (Oral)  Resp 12  Ht 5' 9.5" (1.765 m)  Wt 202 lb (91.627 kg)  BMI 29.41 kg/m2  SpO2 96%   HENT:  Head: Normocephalic and atraumatic.  Right Ear: External ear normal.  Left Ear: External ear normal.  Cardiovascular: Normal rate, regular rhythm, normal heart sounds and intact distal pulses.  Exam reveals no gallop and no friction rub.   No murmur heard. Pulmonary/Chest: Effort normal and breath sounds normal. No respiratory distress. He has no wheezes. He has no rales. He exhibits no tenderness.  Abdominal: Bowel sounds are normal. He exhibits no distension and no mass. There is tenderness. There is no rebound and no guarding.  Pt very hesitant when I was palpating around LLQ. He started to guard and released, but tender to light  palpation.   Neurological: He is alert and oriented to person, place, and time.  Skin: Skin is warm and dry. No rash noted. He is not diaphoretic.  Psychiatric: He has a normal mood and affect. His behavior is normal. Judgment and thought content normal.      Assessment & Plan:

## 2014-10-24 DIAGNOSIS — H40031 Anatomical narrow angle, right eye: Secondary | ICD-10-CM | POA: Diagnosis not present

## 2014-11-15 ENCOUNTER — Other Ambulatory Visit: Payer: Self-pay | Admitting: Family Medicine

## 2015-01-01 DIAGNOSIS — Z1283 Encounter for screening for malignant neoplasm of skin: Secondary | ICD-10-CM | POA: Diagnosis not present

## 2015-01-01 DIAGNOSIS — L570 Actinic keratosis: Secondary | ICD-10-CM | POA: Diagnosis not present

## 2015-01-01 DIAGNOSIS — Z872 Personal history of diseases of the skin and subcutaneous tissue: Secondary | ICD-10-CM | POA: Diagnosis not present

## 2015-01-01 DIAGNOSIS — L309 Dermatitis, unspecified: Secondary | ICD-10-CM | POA: Diagnosis not present

## 2015-01-08 DIAGNOSIS — H35373 Puckering of macula, bilateral: Secondary | ICD-10-CM | POA: Diagnosis not present

## 2015-01-15 ENCOUNTER — Other Ambulatory Visit: Payer: Self-pay | Admitting: Family Medicine

## 2015-02-01 ENCOUNTER — Other Ambulatory Visit: Payer: Medicare HMO

## 2015-02-08 ENCOUNTER — Encounter: Payer: Medicare HMO | Admitting: Family Medicine

## 2015-02-18 ENCOUNTER — Other Ambulatory Visit: Payer: Self-pay

## 2015-03-04 ENCOUNTER — Telehealth: Payer: Self-pay | Admitting: Family Medicine

## 2015-03-04 NOTE — Telephone Encounter (Signed)
Pt called to r/s his cpx appointment with you on March 29, 2023 due to death in family and will be out of town until next week.  Your next cpx is 10/20.  Pt wanted to know if he could be see sooner

## 2015-03-05 ENCOUNTER — Telehealth: Payer: Self-pay | Admitting: Family Medicine

## 2015-03-05 DIAGNOSIS — E7800 Pure hypercholesterolemia, unspecified: Secondary | ICD-10-CM

## 2015-03-05 DIAGNOSIS — E349 Endocrine disorder, unspecified: Secondary | ICD-10-CM

## 2015-03-05 NOTE — Telephone Encounter (Signed)
-----   Message from Ellamae Sia sent at 02/27/2015  3:56 PM EDT ----- Regarding: Lab orders for Wednesday,7.13.16 Patient is scheduled for CPX labs, please order future labs, Thanks , Karna Christmas

## 2015-03-05 NOTE — Telephone Encounter (Signed)
Okay to put in sooner. 30 min slot.

## 2015-03-06 ENCOUNTER — Other Ambulatory Visit: Payer: Medicare HMO

## 2015-03-13 ENCOUNTER — Encounter: Payer: Medicare HMO | Admitting: Family Medicine

## 2015-03-13 NOTE — Telephone Encounter (Signed)
Left message asking pt to call office  °

## 2015-03-18 NOTE — Telephone Encounter (Signed)
Appointment 8/22

## 2015-03-27 ENCOUNTER — Other Ambulatory Visit: Payer: Self-pay | Admitting: *Deleted

## 2015-03-27 MED ORDER — GEMFIBROZIL 600 MG PO TABS: ORAL_TABLET | ORAL | Status: DC

## 2015-03-27 NOTE — Telephone Encounter (Signed)
Patient called stating that he had requested a refill on Gemfibrozil from [facility] and they told him that the script came back denied stating that he was not a patient of Dr. Rometta Emery. Patient stated that he is almost out of this medication and needs a refill sent to [facility]. Patient has an appointment scheduled with Dr. Diona Browner 04/15/15 and lab appointment 04/08/15. Patient stated that he has had to cancel several appointments because he has been going out of the state to care for his mother and she recently passed away.   Okay per Dr. Damita Dunnings to refill until appointment. Refill sent to pharmacy.

## 2015-04-08 ENCOUNTER — Other Ambulatory Visit: Payer: Commercial Managed Care - HMO

## 2015-04-09 ENCOUNTER — Other Ambulatory Visit (INDEPENDENT_AMBULATORY_CARE_PROVIDER_SITE_OTHER): Payer: Commercial Managed Care - HMO

## 2015-04-09 DIAGNOSIS — E7800 Pure hypercholesterolemia, unspecified: Secondary | ICD-10-CM

## 2015-04-09 DIAGNOSIS — H0289 Other specified disorders of eyelid: Secondary | ICD-10-CM | POA: Diagnosis not present

## 2015-04-09 LAB — LIPID PANEL
CHOL/HDL RATIO: 4
Cholesterol: 156 mg/dL (ref 0–200)
HDL: 37.8 mg/dL — ABNORMAL LOW (ref >39.00)
LDL Cholesterol: 107 mg/dL — ABNORMAL HIGH (ref 0–99)
NONHDL: 118.34
Triglycerides: 56 mg/dL (ref 0.0–149.0)
VLDL: 11.2 mg/dL (ref 0.0–40.0)

## 2015-04-09 LAB — COMPREHENSIVE METABOLIC PANEL
ALBUMIN: 4.4 g/dL (ref 3.5–5.2)
ALT: 10 U/L (ref 0–53)
AST: 15 U/L (ref 0–37)
Alkaline Phosphatase: 81 U/L (ref 39–117)
BUN: 20 mg/dL (ref 6–23)
CO2: 29 meq/L (ref 19–32)
CREATININE: 1.06 mg/dL (ref 0.40–1.50)
Calcium: 9.7 mg/dL (ref 8.4–10.5)
Chloride: 108 mEq/L (ref 96–112)
GFR: 72.58 mL/min (ref >60.00)
GLUCOSE: 95 mg/dL (ref 70–99)
POTASSIUM: 4.5 meq/L (ref 3.5–5.1)
SODIUM: 142 meq/L (ref 135–145)
Total Bilirubin: 0.6 mg/dL (ref 0.2–1.2)
Total Protein: 7.2 g/dL (ref 6.0–8.3)

## 2015-04-13 ENCOUNTER — Other Ambulatory Visit: Payer: Self-pay | Admitting: Family Medicine

## 2015-04-15 ENCOUNTER — Encounter: Payer: Self-pay | Admitting: Family Medicine

## 2015-04-15 ENCOUNTER — Ambulatory Visit (INDEPENDENT_AMBULATORY_CARE_PROVIDER_SITE_OTHER): Payer: Commercial Managed Care - HMO | Admitting: Family Medicine

## 2015-04-15 VITALS — BP 130/76 | HR 87 | Temp 98.3°F | Ht 69.0 in | Wt 200.5 lb

## 2015-04-15 DIAGNOSIS — R1314 Dysphagia, pharyngoesophageal phase: Secondary | ICD-10-CM | POA: Diagnosis not present

## 2015-04-15 DIAGNOSIS — Z23 Encounter for immunization: Secondary | ICD-10-CM | POA: Diagnosis not present

## 2015-04-15 DIAGNOSIS — R0989 Other specified symptoms and signs involving the circulatory and respiratory systems: Secondary | ICD-10-CM | POA: Insufficient documentation

## 2015-04-15 DIAGNOSIS — R0689 Other abnormalities of breathing: Secondary | ICD-10-CM

## 2015-04-15 DIAGNOSIS — I10 Essential (primary) hypertension: Secondary | ICD-10-CM

## 2015-04-15 DIAGNOSIS — Z0000 Encounter for general adult medical examination without abnormal findings: Secondary | ICD-10-CM | POA: Diagnosis not present

## 2015-04-15 DIAGNOSIS — Z7189 Other specified counseling: Secondary | ICD-10-CM | POA: Insufficient documentation

## 2015-04-15 NOTE — Progress Notes (Signed)
The patient is here for annual wellness exam and preventative care.   Hypertension: At goal on lisinopril  BP Readings from Last 3 Encounters:  04/15/15 130/76  10/04/14 130/76  06/12/14 120/70  Using medication without problems or lightheadedness: None  Chest pain with exertion: None  Edema:None  Short of breath:None  Average home BPs: 130/80  Other issues:   Elevated Cholesterol: LDL at goal < 130 on gemfibrozil. He has stopped niacin due to cost. Lab Results  Component Value Date   CHOL 156 04/09/2015   HDL 37.80* 04/09/2015   LDLCALC 107* 04/09/2015   LDLDIRECT 107.6 12/14/2011   TRIG 56.0 04/09/2015   CHOLHDL 4 04/09/2015  Using medications without problems: None Muscle aches: None  Diet compliance: moderate, using weight watchers Exercise: walking 3-4 times a week  Other complaints:  Wt Readings from Last 3 Encounters:  04/15/15 200 lb 8 oz (90.946 kg)  10/04/14 202 lb (91.627 kg)  06/12/14 205 lb 8 oz (93.214 kg)    He has been noting choking/coughing with drinking liquids. Occuring 2 times  A day. Occuring occ when just sitting with his saliva.  No pain with swallowing.  No other neuro symptoms.  Mother passed away in Mar 14, 2023 ( squamous cell CA of palate) she was aspirating a lot so, he wonders if it is behavoiral/mental.  No  GERD unless eating chilli. Improves with tums.     Social History   .  Marital Status:  Married     Spouse Name:  N/A     Number of Children:  3   .  Years of Education:  N/A    Occupational History   .  QUOTATION ENGINEER  [employer]    Social History Main Topics   .  Smoking status:  Former Smoker -- 8 years     Types:  Cigarettes     Quit date:  01/05/1967   .  Smokeless tobacco:  Never Used      Comment: QUIT AGE 79   .  Alcohol Use:  Yes      Comment: OCCASIONAL   .  Drug Use:  No   .  Sexually Active:  None    Other Topics  Concern   .  None     Social History Narrative    regular exercise    Full code, (Reviewed 2015)   Review of Systems  Constitutional: Negative for fever and fatigue.  HENT: Negative for ear pain.  Eyes: Negative for pain.  Respiratory: Negative for shortness of breath.  Cardiovascular: Negative for chest pain, palpitations and leg swelling.  Gastrointestinal: Negative for abdominal pain.  Endocrine: Negative for polyuria.  Genitourinary: Negative for hematuria.  Objective:   Physical Exam  Constitutional: He is oriented to person, place, and time. Vital signs are normal. He appears well-developed and well-nourished.  HENT:  Head: Normocephalic.  Right Ear: Hearing normal.  Left Ear: Hearing normal.  Nose: Nose normal.  Mouth/Throat: Oropharynx is clear and moist and mucous membranes are normal.  Neck: Trachea normal. Carotid bruit is not present. No mass and no thyromegaly present.  Cardiovascular: Normal rate, regular rhythm and normal pulses. Exam reveals no gallop, no distant heart sounds and no friction rub.  No murmur heard. No peripheral edema  Pulmonary/Chest: Effort normal and breath sounds normal. No respiratory distress.  Neurological: He is alert and oriented to person, place, and time. He has normal reflexes. He displays normal reflexes. No cranial nerve deficit. Coordination normal.  Skin: Skin is warm, dry and intact. No rash noted.  Psychiatric: He has a normal mood and affect. His speech is normal and behavior is normal. Thought content normal.  Assessment & Plan:   The patient's preventative maintenance and recommended screening tests for an annual wellness exam were reviewed in full today.  Brought up to date unless services declined.  Counselled on the importance of diet, exercise, and its role in overall health and mortality.  The patient's FH and SH was reviewed, including their home life, tobacco status, and drug and alcohol status.   Vaccines:  Uptodate with shingles, PNA and Tdap . Given flu and prevnar today. Colon: 10/2005, [facility], nml, plan every 10 years.  Prostate: per uro. PSA nml on 08/2013 labs  Has upcoming appt with Dr. Diona Fanti next week. Former smoker, remotely

## 2015-04-15 NOTE — Progress Notes (Signed)
Pre visit review using our clinic review tool, if applicable. No additional management support is needed unless otherwise documented below in the visit note. 

## 2015-04-15 NOTE — Addendum Note (Signed)
Addended byEliezer Lofts E on: 04/15/2015 12:48 PM   Modules accepted: SmartSet

## 2015-04-15 NOTE — Addendum Note (Signed)
Addended byEliezer Lofts E on: 04/15/2015 12:54 PM   Modules accepted: SmartSet

## 2015-04-15 NOTE — Patient Instructions (Addendum)
Stop at front desk to set up swallowing study.  Keep working on healthy eating and regular exercise.

## 2015-04-16 ENCOUNTER — Other Ambulatory Visit (HOSPITAL_COMMUNITY): Payer: Self-pay | Admitting: Family Medicine

## 2015-04-16 DIAGNOSIS — R1314 Dysphagia, pharyngoesophageal phase: Secondary | ICD-10-CM

## 2015-04-17 DIAGNOSIS — E291 Testicular hypofunction: Secondary | ICD-10-CM | POA: Diagnosis not present

## 2015-04-17 DIAGNOSIS — Z87442 Personal history of urinary calculi: Secondary | ICD-10-CM | POA: Diagnosis not present

## 2015-04-17 DIAGNOSIS — N400 Enlarged prostate without lower urinary tract symptoms: Secondary | ICD-10-CM | POA: Diagnosis not present

## 2015-04-22 ENCOUNTER — Ambulatory Visit (HOSPITAL_COMMUNITY)
Admission: RE | Admit: 2015-04-22 | Discharge: 2015-04-22 | Disposition: A | Discharge status: Home / Self Care | Payer: Commercial Managed Care - HMO | Source: Ambulatory Visit | Attending: Family Medicine | Admitting: Family Medicine

## 2015-04-22 DIAGNOSIS — R05 Cough: Secondary | ICD-10-CM | POA: Diagnosis not present

## 2015-04-22 DIAGNOSIS — R0989 Other specified symptoms and signs involving the circulatory and respiratory systems: Secondary | ICD-10-CM | POA: Insufficient documentation

## 2015-04-22 DIAGNOSIS — R1314 Dysphagia, pharyngoesophageal phase: Secondary | ICD-10-CM

## 2015-04-22 NOTE — Procedures (Signed)
Objective Swallowing Evaluation: Other (Comment)  Patient Details  Name: Jack Norris MRN: [id number] Date of Birth: [birth year]  Today's Date: 04/22/2015 Time: SLP Start Time (ACUTE ONLY): 1300-SLP Stop Time (ACUTE ONLY): 1335 SLP Time Calculation (min) (ACUTE ONLY): 35 min  Past Medical History:  Past Medical History  Diagnosis Date  . Arthritis   . Hypertension   . Eczema   . BPH (benign prostatic hypertrophy)   . Reflux OCCASIONAL-  WATCHES DIET  . History of kidney stones   . Simple renal cyst right interpoler, simpler in nature , asymptomatic  per urologist note (dr dahlstedt)  12-26-2011    BENIGN  . Renal calculi right     AND LEFT X1 NON-OBSTRUCTIVE  . Asthma, mild     as child-only now if has URI  . Wears glasses    Past Surgical History:  Past Surgical History  Procedure Laterality Date  . Vasectomy  1970'S    GEN. ANES.  . Lithotripsy  APPROX.  1998    ES[facility]  left kidney x 2  . Bone spur removal      BILATERAL GREAT TOE  . Rotator cuff repair  2012    LEFT SHOULDER  . Transurethral resection of prostate  11-29-2009    BPH  . Cysto/ right retrograde pyelogram/ right ureter dilatation/ right ureteroscopy/ laser lithotripsy right renal pelvis and lower pole calculi/ placement stent  01-11-2012  DR DAHLSTEDT    RIGHT RENAL PELVIS AND LOWER POLE CALCULI  . Cystoscopy with ureteroscopy  06/02/2012    Procedure: CYSTOSCOPY WITH URETEROSCOPY;  Surgeon: Franchot Gallo, MD;  Location: [facility];  Service: Urology;  Laterality: Right;  90 MIN ALSO EXTRACTION OF CALCULI   . Cystoscopy w/ retrogrades  06/02/2012    Procedure: CYSTOSCOPY WITH RETROGRADE PYELOGRAM;  Surgeon: Franchot Gallo, MD;  Location: [facility];  Service: Urology;  Laterality: Right;  . Shoulder arthroscopy with rotator cuff repair and subacromial decompression  09/12/2012    Procedure: SHOULDER ARTHROSCOPY WITH ROTATOR CUFF REPAIR AND SUBACROMIAL  DECOMPRESSION;  Surgeon: Nita Sells, MD;  Location: [place];  Service: Orthopedics;  Laterality: Right;  see above and biceps tendonotomy   HPI:  Other Pertinent Information: 74 yo male referred for MBS by Dr Diona Browner due to pt report of choking on saliva 1-2 times each day- NOT occuring with meal.  Events may occur when pt is reclining in his chair as pt spends time in his recliner per his wife.  Pt reports choking does NOT occur during meals or with liquids.  He has not required heimlich manuever, had unintentional weight loss nor frequent pneumonias.  Pt PMH + for smoker = quit in 1960s, HTN, GERD, asthma, memory and hearing loss, OEA, BPH, dental grinding - s/p  jaw realignment and 23 crowns placed approx 6 years ago.   Medication list includes lisinopril, ASA, gemfibrozil per his records.  Pt also reports h/o endoscopy completed approx 15-20 years ago with diagnosis of reflux that was "healing nicely" per his statement.  He then took OTC reflux medication and slept with HOB elevated on short term basis per spouse.  Pt admits to reflux episode last week after eating chili - he reports he took Tums and symptoms resolved.    No Data Recorded  Assessment / Plan / Recommendation CHL IP CLINICAL IMPRESSIONS 04/22/2015  Therapy Diagnosis WFL  Clinical Impression  Pt presents with functional oropharyngeal swallow ability - No aspiraiton or penetration of any  consistency tested noted.  Swallow was timely and strong with boluses observed *thin, nectar, pudding, cracker and barium tablet.  Of note pt did report sensatin of barium tablet lodging in distal esophagus upon completion of study - Esophageal sweep at end of test completed with appearance of clear esophagus and no delay in pill entering stomach.    Reflux Symptom Index (RSI) administered to patient with is score being 10 of 45 - indicating no significant symptoms of LPR.  Provided pt with reflux precautions given his h/o  reflux.  Thanks for this referral.       No flowsheet data found.   CHL IP DIET RECOMMENDATION 04/22/2015  SLP Diet Recommendations Age appropriate regular solids;Thin  Liquid Administration via As tolerated  Medication Administration Whole meds with liquid  Compensations Slow rate;Small sips/bites  Postural Changes and/or Swallow Maneuvers Reflux precautions     CHL IP OTHER RECOMMENDATIONS 04/22/2015  Recommended Consults (None)  Oral Care Recommendations Oral care BID  Other Recommendations (None)         CHL IP REASON FOR REFERRAL 04/22/2015  Reason for Referral Objectively evaluate swallowing function     CHL IP ORAL PHASE 04/22/2015  Oral Phase WFL      CHL IP PHARYNGEAL PHASE 04/22/2015  Pharyngeal Phase WFL      CHL IP CERVICAL ESOPHAGEAL PHASE 04/22/2015  Cervical Esophageal Phase WFL  Nectar Cup [facility]  Nectar Straw (None)  Nectar Sippy Cup (None)  Thin Teaspoon (None)  Thin Cup [facility]    CHL IP GO 04/22/2015  Functional Assessment Tool Used MBS, clinical judgement  Functional Limitations Swallowing  Swallow Current Status (W8032) CI  Swallow Goal Status (Z2248) CI  Swallow Discharge Status [phone]) CI           Luanna Salk, [facility] SLP [phone]  04/22/2015, 2:29 PM

## 2015-04-30 ENCOUNTER — Other Ambulatory Visit: Payer: Self-pay | Admitting: *Deleted

## 2015-04-30 MED ORDER — LISINOPRIL 20 MG PO TABS: 20.0000 mg | ORAL_TABLET | Freq: Every day | ORAL | Status: DC

## 2015-05-14 DIAGNOSIS — D72829 Elevated white blood cell count, unspecified: Secondary | ICD-10-CM | POA: Diagnosis not present

## 2015-05-14 DIAGNOSIS — R338 Other retention of urine: Secondary | ICD-10-CM | POA: Diagnosis not present

## 2015-05-14 DIAGNOSIS — R162 Hepatomegaly with splenomegaly, not elsewhere classified: Secondary | ICD-10-CM | POA: Diagnosis not present

## 2015-05-14 DIAGNOSIS — G8918 Other acute postprocedural pain: Secondary | ICD-10-CM | POA: Diagnosis not present

## 2015-05-14 DIAGNOSIS — K3580 Unspecified acute appendicitis: Secondary | ICD-10-CM | POA: Diagnosis not present

## 2015-05-14 DIAGNOSIS — E785 Hyperlipidemia, unspecified: Secondary | ICD-10-CM | POA: Diagnosis not present

## 2015-05-14 DIAGNOSIS — I10 Essential (primary) hypertension: Secondary | ICD-10-CM | POA: Diagnosis not present

## 2015-05-14 DIAGNOSIS — R1032 Left lower quadrant pain: Secondary | ICD-10-CM | POA: Diagnosis not present

## 2015-05-14 DIAGNOSIS — N281 Cyst of kidney, acquired: Secondary | ICD-10-CM | POA: Diagnosis not present

## 2015-05-15 DIAGNOSIS — G8918 Other acute postprocedural pain: Secondary | ICD-10-CM | POA: Diagnosis not present

## 2015-05-15 DIAGNOSIS — R339 Retention of urine, unspecified: Secondary | ICD-10-CM | POA: Diagnosis not present

## 2015-05-15 DIAGNOSIS — R338 Other retention of urine: Secondary | ICD-10-CM | POA: Diagnosis not present

## 2015-05-15 DIAGNOSIS — K3580 Unspecified acute appendicitis: Secondary | ICD-10-CM | POA: Diagnosis not present

## 2015-05-15 DIAGNOSIS — D72829 Elevated white blood cell count, unspecified: Secondary | ICD-10-CM | POA: Diagnosis not present

## 2015-05-15 DIAGNOSIS — I10 Essential (primary) hypertension: Secondary | ICD-10-CM | POA: Diagnosis not present

## 2015-05-15 DIAGNOSIS — E785 Hyperlipidemia, unspecified: Secondary | ICD-10-CM | POA: Diagnosis not present

## 2015-05-16 DIAGNOSIS — K3580 Unspecified acute appendicitis: Secondary | ICD-10-CM | POA: Diagnosis not present

## 2015-05-16 DIAGNOSIS — D72829 Elevated white blood cell count, unspecified: Secondary | ICD-10-CM | POA: Diagnosis not present

## 2015-05-16 DIAGNOSIS — E785 Hyperlipidemia, unspecified: Secondary | ICD-10-CM | POA: Diagnosis not present

## 2015-05-16 DIAGNOSIS — R339 Retention of urine, unspecified: Secondary | ICD-10-CM | POA: Diagnosis not present

## 2015-05-16 DIAGNOSIS — R338 Other retention of urine: Secondary | ICD-10-CM | POA: Diagnosis not present

## 2015-05-16 DIAGNOSIS — G8918 Other acute postprocedural pain: Secondary | ICD-10-CM | POA: Diagnosis not present

## 2015-05-16 DIAGNOSIS — I10 Essential (primary) hypertension: Secondary | ICD-10-CM | POA: Diagnosis not present

## 2015-05-17 DIAGNOSIS — G8918 Other acute postprocedural pain: Secondary | ICD-10-CM | POA: Diagnosis not present

## 2015-05-17 DIAGNOSIS — E785 Hyperlipidemia, unspecified: Secondary | ICD-10-CM | POA: Diagnosis not present

## 2015-05-17 DIAGNOSIS — K3580 Unspecified acute appendicitis: Secondary | ICD-10-CM | POA: Diagnosis not present

## 2015-05-17 DIAGNOSIS — I10 Essential (primary) hypertension: Secondary | ICD-10-CM | POA: Diagnosis not present

## 2015-05-17 DIAGNOSIS — R338 Other retention of urine: Secondary | ICD-10-CM | POA: Diagnosis not present

## 2015-05-17 DIAGNOSIS — D72829 Elevated white blood cell count, unspecified: Secondary | ICD-10-CM | POA: Diagnosis not present

## 2015-05-28 ENCOUNTER — Ambulatory Visit (INDEPENDENT_AMBULATORY_CARE_PROVIDER_SITE_OTHER): Payer: Commercial Managed Care - HMO | Admitting: Family Medicine

## 2015-05-28 ENCOUNTER — Encounter: Payer: Self-pay | Admitting: Family Medicine

## 2015-05-28 VITALS — BP 120/70 | HR 84 | Temp 98.4°F | Ht 69.0 in | Wt 195.8 lb

## 2015-05-28 DIAGNOSIS — K760 Fatty (change of) liver, not elsewhere classified: Secondary | ICD-10-CM | POA: Insufficient documentation

## 2015-05-28 DIAGNOSIS — Z09 Encounter for follow-up examination after completed treatment for conditions other than malignant neoplasm: Secondary | ICD-10-CM | POA: Diagnosis not present

## 2015-05-28 NOTE — Assessment & Plan Note (Signed)
Workl on low fat diet, continue exercise and weight loss. HAs lost 7 more lbs. Body mass index is 28.89 kg/(m^2).

## 2015-05-28 NOTE — Progress Notes (Signed)
   Subjective:    Patient ID: Jack Norris, male    DOB: 01-28-1941, 74 y.o.   MRN: [id number]  HPI 74 year old male pt presents for hospital follow up for appendectomy on 9/21. Records from [facility] in [place] reviewed in detail.   Presented with classic RLQ pain symptoms, fever.   Appendicitis and possible small perforation seen on CT. Also noted tiny cyst lower pole right kidney (known previously), fatty liver, mild hepatosplenomegaly, gallstones and tiny umbilical hernia containing fat. Treated with IV Zosyn, followed by 5 days of oral antibiotics (Augmentin). Following appendectomy by Dr. Tye Maryland. Only comp post op was urinary retention.. Given flomax and catheterization prn.  Cbc 14.1 Cr 1.19  Today he reports:  No further issues.  Good po intake.  Nml BMs.  No UOP.  No fever.  Working on [employer], low fat diet and weight loss.   Wt Readings from Last 3 Encounters:  05/28/15 195 lb 12 oz (88.792 kg)  04/15/15 200 lb 8 oz (90.946 kg)  10/04/14 202 lb (91.627 kg)      Review of Systems  Constitutional: Negative for fever and fatigue.  HENT: Negative for ear pain.   Eyes: Negative for pain.  Respiratory: Negative for shortness of breath.        Objective:   Physical Exam  Constitutional: Vital signs are normal. He appears well-developed and well-nourished.  HENT:  Head: Normocephalic.  Right Ear: Hearing normal.  Left Ear: Hearing normal.  Nose: Nose normal.  Mouth/Throat: Oropharynx is clear and moist and mucous membranes are normal.  Neck: Trachea normal. Carotid bruit is not present. No thyroid mass and no thyromegaly present.  Cardiovascular: Normal rate, regular rhythm and normal pulses.  Exam reveals no gallop, no distant heart sounds and no friction rub.   No murmur heard. No peripheral edema  Pulmonary/Chest: Effort normal and breath sounds normal. No respiratory distress.  Abdominal: Normal appearance and bowel sounds are normal. He exhibits  no shifting dullness and no abdominal bruit. There is no hepatosplenomegaly. There is no tenderness. There is no rebound and no CVA tenderness.    Healing surgical sites, with steri strips  Skin: Skin is warm, dry and intact. No rash noted.  Psychiatric: He has a normal mood and affect. His speech is normal and behavior is normal. Thought content normal.          Assessment & Plan:

## 2015-05-28 NOTE — Assessment & Plan Note (Signed)
No complications, doing well post op.

## 2015-05-28 NOTE — Patient Instructions (Addendum)
Keep on working on healthy habits.

## 2015-05-28 NOTE — Progress Notes (Signed)
Pre visit review using our clinic review tool, if applicable. No additional management support is needed unless otherwise documented below in the visit note. 

## 2015-06-07 ENCOUNTER — Other Ambulatory Visit: Payer: Medicare HMO

## 2015-06-13 ENCOUNTER — Encounter: Payer: Medicare HMO | Admitting: Family Medicine

## 2015-06-17 ENCOUNTER — Encounter: Payer: Self-pay | Admitting: Family Medicine

## 2015-06-17 ENCOUNTER — Telehealth: Payer: Self-pay | Admitting: Family Medicine

## 2015-06-17 ENCOUNTER — Ambulatory Visit (INDEPENDENT_AMBULATORY_CARE_PROVIDER_SITE_OTHER): Payer: Commercial Managed Care - HMO | Admitting: Family Medicine

## 2015-06-17 VITALS — BP 130/60 | HR 70 | Temp 98.3°F | Ht 69.0 in | Wt 206.5 lb

## 2015-06-17 DIAGNOSIS — R1031 Right lower quadrant pain: Secondary | ICD-10-CM | POA: Diagnosis not present

## 2015-06-17 DIAGNOSIS — Z09 Encounter for follow-up examination after completed treatment for conditions other than malignant neoplasm: Secondary | ICD-10-CM | POA: Diagnosis not present

## 2015-06-17 LAB — CBC WITH DIFFERENTIAL/PLATELET
BASOS ABS: 0 10*3/uL (ref 0.0–0.1)
Basophils Relative: 0.5 % (ref 0.0–3.0)
EOS PCT: 2.6 % (ref 0.0–5.0)
Eosinophils Absolute: 0.2 10*3/uL (ref 0.0–0.7)
HCT: 41.8 % (ref 39.0–52.0)
HEMOGLOBIN: 14.1 g/dL (ref 13.0–17.0)
Lymphocytes Relative: 22.2 % (ref 12.0–46.0)
Lymphs Abs: 1.6 10*3/uL (ref 0.7–4.0)
MCHC: 33.8 g/dL (ref 30.0–36.0)
MCV: 82.9 fl (ref 78.0–100.0)
MONOS PCT: 7 % (ref 3.0–12.0)
Monocytes Absolute: 0.5 10*3/uL (ref 0.1–1.0)
NEUTROS PCT: 67.7 % (ref 43.0–77.0)
Neutro Abs: 4.8 10*3/uL (ref 1.4–7.7)
Platelets: 297 10*3/uL (ref 150.0–400.0)
RBC: 5.04 Mil/uL (ref 4.22–5.81)
RDW: 14.4 % (ref 11.5–15.5)
WBC: 7.1 10*3/uL (ref 4.0–10.5)

## 2015-06-17 LAB — BASIC METABOLIC PANEL
BUN: 16 mg/dL (ref 6–23)
CALCIUM: 9.9 mg/dL (ref 8.4–10.5)
CO2: 28 mEq/L (ref 19–32)
Chloride: 105 mEq/L (ref 96–112)
Creatinine, Ser: 0.99 mg/dL (ref 0.40–1.50)
GFR: 78.49 mL/min (ref >60.00)
Glucose, Bld: 87 mg/dL (ref 70–99)
POTASSIUM: 4.3 meq/L (ref 3.5–5.1)
SODIUM: 141 meq/L (ref 135–145)

## 2015-06-17 LAB — HEPATIC FUNCTION PANEL
ALBUMIN: 4.6 g/dL (ref 3.5–5.2)
ALK PHOS: 100 U/L (ref 39–117)
ALT: 14 U/L (ref 0–53)
AST: 16 U/L (ref 0–37)
BILIRUBIN TOTAL: 0.5 mg/dL (ref 0.2–1.2)
Bilirubin, Direct: 0.1 mg/dL (ref 0.0–0.3)
Total Protein: 7.5 g/dL (ref 6.0–8.3)

## 2015-06-17 LAB — LIPASE: LIPASE: 27 U/L (ref 11.0–59.0)

## 2015-06-17 NOTE — Telephone Encounter (Signed)
Scheduled to see Dr. Lorelei Pont today at 11:00 am.

## 2015-06-17 NOTE — Patient Instructions (Signed)

## 2015-06-17 NOTE — Telephone Encounter (Signed)
PLEASE NOTE: All timestamps contained within this report are represented as [place] Standard Time. CONFIDENTIALTY NOTICE: This fax transmission is intended only for the addressee. It contains information that is legally privileged, confidential or otherwise protected from use or disclosure. If you are not the intended recipient, you are strictly prohibited from reviewing, disclosing, copying using or disseminating any of this information or taking any action in reliance on or regarding this information. If you have received this fax in error, please notify us immediately by telephone so that we can arrange for its return to [place]. Phone: [phone], Toll-Free: [phone], Fax: [phone] Page: 1 of 2 Call Id: [id number] [place] Patient Name: Jack Norris Gender: Male DOB: 20-Aug-1941 Age: 74 Y 2 M 25 D Return Phone Number: [phone] (Primary), [phone] (Secondary) Address[place] [address] Client [place] Night - Client Client Site [place] Physician Diona Browner, [place] Contact Type Call Call Type Triage / Clinical Relationship To Patient Self Return Phone Number [phone] (Primary) Chief Complaint Abdominal Pain Initial Comment Caller States has lower right abdomen pain about #4. started yesterday PreDisposition Call Doctor Nurse Assessment Nurse: Ronnie Derby, RN, Estill Bamberg Date/Time Eilene Ghazi Time): 06/15/2015 9:52:36 AM Confirm and document reason for call. If symptomatic, describe symptoms. ---Caller states he is having some lower right abdominal pain that seems to be getting worse. No other symptoms. He had an appendectomy about a month ago. Has the patient traveled out of the country within the last 30 days? ---No Does the patient have any new or worsening symptoms? ---Yes Will a triage be completed?  ---Yes Related visit to physician within the last 2 weeks? ---No Does the PT have any chronic conditions? (i.e. diabetes, asthma, etc.) ---Yes List chronic conditions. ---HTN Guidelines Guideline Title Affirmed Question Affirmed Notes Nurse Date/Time Eilene Ghazi Time) Post-Op Symptoms and Questions Other post-op symptom or question (all triage questions negative) Ronnie Derby, RN, Estill Bamberg 06/15/2015 9:54:38 AM Disp. Time Eilene Ghazi Time) Disposition Final User 06/15/2015 9:58:14 AM Home Care Yes Omer, RN, Shelly Coss Understands: Yes Disagree/Comply: Comply Care Advice Given Per Guideline PLEASE NOTE: All timestamps contained within this report are represented as [place] Standard Time. CONFIDENTIALTY NOTICE: This fax transmission is intended only for the addressee. It contains information that is legally privileged, confidential or otherwise protected from use or disclosure. If you are not the intended recipient, you are strictly prohibited from reviewing, disclosing, copying using or disseminating any of this information or taking any action in reliance on or regarding this information. If you have received this fax in error, please notify us immediately by telephone so that we can arrange for its return to [place]. Phone: [phone], Toll-Free: [phone], Fax: [phone] Page: 2 of 2 Call Id: [id number] Care Advice Given Per Guideline HOME CARE: You should be able to treat this at home. HOME CARE: You should be able to treat this at home. PAIN MEDICINE: * For relief of mild to moderate pain, you may take ACETAMINOPHEN every 6 hours (Adults [phone] mg) OR IBUPROFEN every 6-8 hours (Adults 400 mg). LIQUIDS: Adequate liquid intake is important. Drink 8 glasses of water or other liquid daily. CALL BACK IF: * Severe pain * Fever over 100.5 F (38.1 C) * You become worse. CARE ADVICE per Post-Op Symptoms and Questions (Adult) guideline. After Care Instructions Given Call Event Type User Date / Time  Description

## 2015-06-17 NOTE — Progress Notes (Signed)
Pre visit review using our clinic review tool, if applicable. No additional management support is needed unless otherwise documented below in the visit note. 

## 2015-06-17 NOTE — Progress Notes (Signed)
Dr. Frederico Hamman T. Taryn Shellhammer, MD, [facility] [place], [address] Phone: [phone] Fax: [phone]  06/17/2015  Patient: Jack Norris, MRN: [id number], DOB: 1940-08-29, 74 y.o.  Primary Physician:  Eliezer Lofts, MD   Chief Complaint  Patient presents with  . Abdominal Pain    Lower Abdomen-Lower Right Side   Subjective:   Jack Norris is a 74 y.o. very pleasant male patient who presents with the following:  S/p appy 1 month ago.  Saw Dr. Diona Browner the first week of October and everything was feeling ok. Still have some pain.  Had some mild pain at post-op check.  Discomfort has grown and it will come and go and speards to the right.  His abdominal calm for has continued to heightened.  He is accompanied by his wife today.  He is currently not febrile in the office.  On his appendectomy there was a question of potential abscess at the time of surgery.  He was held in the hospital and had a longer course of IV antibiotics..  Augmentin - if diverticulitis  Past Medical History, Surgical History, Social History, Family History, Problem List, Medications, and Allergies have been reviewed and updated if relevant.  Patient Active Problem List   Diagnosis Date Noted  . Status post appendectomy, follow-up exam 05/28/2015  . Fatty liver disease, nonalcoholic 77/93/9030  . Counseling regarding end of life decision making 04/15/2015  . Left knee pain 06/12/2014  . Frequent nosebleeds 06/12/2014  . Memory loss 01/27/2013  . Solitary cyst of kidney 05/03/2012  . Nephrolithiasis 03/27/2011  . HYPERCHOLESTEROLEMIA 12/09/2007  . Testosterone deficiency 09/21/2007  . LOSS, HEARING NEC 04/07/2007  . HYPERTRIGLYCERIDEMIA 02/16/2007  . OBESITY 02/16/2007  . Essential hypertension 02/16/2007  . ASTHMA, CHILDHOOD 02/16/2007  . GERD 02/16/2007  . B P H 02/16/2007  . OSTEOARTHRITIS 02/16/2007  . ICHTHYOSIS 02/16/2007     Past Medical History  Diagnosis Date  . Arthritis   . Hypertension   . Eczema   . BPH (benign prostatic hypertrophy)   . Reflux OCCASIONAL-  WATCHES DIET  . History of kidney stones   . Simple renal cyst right interpoler, simpler in nature , asymptomatic  per urologist note (dr dahlstedt)  12-26-2011    BENIGN  . Renal calculi right     AND LEFT X1 NON-OBSTRUCTIVE  . Asthma, mild     as child-only now if has URI  . Wears glasses     Past Surgical History  Procedure Laterality Date  . Vasectomy  1970'S    GEN. ANES.  . Lithotripsy  APPROX.  1998    ESWL  left kidney x 2  . Bone spur removal      BILATERAL GREAT TOE  . Rotator cuff repair  2012    LEFT SHOULDER  . Transurethral resection of prostate  11-29-2009    BPH  . Cysto/ right retrograde pyelogram/ right ureter dilatation/ right ureteroscopy/ laser lithotripsy right renal pelvis and lower pole calculi/ placement stent  01-11-2012  DR DAHLSTEDT    RIGHT RENAL PELVIS AND LOWER POLE CALCULI  . Cystoscopy with ureteroscopy  06/02/2012    Procedure: CYSTOSCOPY WITH URETEROSCOPY;  Surgeon: Franchot Gallo, MD;  Location: [facility];  Service: Urology;  Laterality: Right;  90 MIN ALSO EXTRACTION OF CALCULI   . Cystoscopy w/ retrogrades  06/02/2012    Procedure: CYSTOSCOPY WITH RETROGRADE PYELOGRAM;  Surgeon: Franchot Gallo, MD;  Location:  [facility];  Service: Urology;  Laterality: Right;  . Shoulder arthroscopy with rotator cuff repair and subacromial decompression  09/12/2012    Procedure: SHOULDER ARTHROSCOPY WITH ROTATOR CUFF REPAIR AND SUBACROMIAL DECOMPRESSION;  Surgeon: Nita Sells, MD;  Location: [facility];  Service: Orthopedics;  Laterality: Right;  see above and biceps tendonotomy    Social History   Social History  . Marital Status: Married    Spouse Name: N/A  . Number of Children: 3  . Years of Education: N/A   Occupational History  .  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 Smoking status: Former Smoker -- 8 years    Types: Cigarettes    Quit date: 01/05/1967  . Smokeless tobacco: Never Used     Comment: QUIT AGE 15  . Alcohol Use: 0.0 oz/week    0 Standard drinks or equivalent per week     Comment: OCCASIONAL  . Drug Use: No  . Sexual Activity: Not on file   Other Topics Concern  . Not on file   Social History Narrative   regular exercise    Full code, (Reviewed 2014)    Family History  Problem Relation Age of Onset  . Cancer Mother     breast and kidney   . Cancer Father     lung  . Heart disease Father   . Squamous cell carcinoma Mother     Allergies  Allergen Reactions  . Paroxetine     REACTION: Had mild personality change on this medication and doesn't want to take this again.  . Ciprofloxacin Other (See Comments)    ABDOMINAL PAIN/ sob  . Flagyl [Metronidazole Hcl] Other (See Comments)    ABDOMINAL PAIN/ sob    Medication list reviewed and updated in full in [facility].  ROS: GEN: Acute illness details above GI: Tolerating PO intake GU: maintaining adequate hydration and urination Pulm: No SOB Interactive and getting along well at home.  Otherwise, ROS is as per the HPI.   Objective:   BP 130/60 mmHg  Pulse 70  Temp(Src) 98.3 F (36.8 C) (Oral)  Ht 5\' 9"  (1.753 m)  Wt 206 lb 8 oz (93.668 kg)  BMI 30.48 kg/m2  GEN: WDWN, NAD, Non-toxic, A & O x 3 HEENT: Atraumatic, Normocephalic. Neck supple. No masses, No LAD. Ears and Nose: No external deformity. CV: RRR, No M/G/R. No JVD. No thrill. No extra heart sounds. PULM: CTA B, no wheezes, crackles, rhonchi. No retractions. No resp. distress. No accessory muscle use. ABD: S, middle stomach, RLQ, hypogastric regions mild - mod ttp, ND, +BS. No rebound. No HSM. EXTR: No c/c/e NEURO Normal gait.  PSYCH: Normally interactive. Conversant. Not depressed or anxious appearing.  Calm demeanor.       Laboratory and Imaging Data: Results for orders placed or performed in visit on 91/47/82  Basic metabolic panel  Result Value Ref Range   Sodium 141 135 - 145 mEq/L   Potassium 4.3 3.5 - 5.1 mEq/L   Chloride 105 96 - 112 mEq/L   CO2 28 19 - 32 mEq/L   Glucose, Bld 87 70 - 99 mg/dL   BUN 16 6 - 23 mg/dL   Creatinine, Ser 0.99 0.40 - 1.50 mg/dL   Calcium 9.9 8.4 - 10.5 mg/dL   GFR 78.49 >60.00 mL/min  CBC with Differential/Platelet  Result Value Ref Range   WBC 7.1 4.0 - 10.5 K/uL   RBC 5.04 4.22 -  5.81 Mil/uL   Hemoglobin 14.1 13.0 - 17.0 g/dL   HCT 41.8 39.0 - 52.0 %   MCV 82.9 78.0 - 100.0 fl   MCHC 33.8 30.0 - 36.0 g/dL   RDW 14.4 11.5 - 15.5 %   Platelets 297.0 150.0 - 400.0 K/uL   Neutrophils Relative % 67.7 43.0 - 77.0 %   Lymphocytes Relative 22.2 12.0 - 46.0 %   Monocytes Relative 7.0 3.0 - 12.0 %   Eosinophils Relative 2.6 0.0 - 5.0 %   Basophils Relative 0.5 0.0 - 3.0 %   Neutro Abs 4.8 1.4 - 7.7 K/uL   Lymphs Abs 1.6 0.7 - 4.0 K/uL   Monocytes Absolute 0.5 0.1 - 1.0 K/uL   Eosinophils Absolute 0.2 0.0 - 0.7 K/uL   Basophils Absolute 0.0 0.0 - 0.1 K/uL  Hepatic function panel  Result Value Ref Range   Total Bilirubin 0.5 0.2 - 1.2 mg/dL   Bilirubin, Direct 0.1 0.0 - 0.3 mg/dL   Alkaline Phosphatase 100 39 - 117 U/L   AST 16 0 - 37 U/L   ALT 14 0 - 53 U/L   Total Protein 7.5 6.0 - 8.3 g/dL   Albumin 4.6 3.5 - 5.2 g/dL  Lipase  Result Value Ref Range   Lipase 27.0 11.0 - 59.0 U/L     Assessment and Plan:   Right lower quadrant abdominal pain - Plan: Basic metabolic panel, CBC with Differential/Platelet, Hepatic function panel, Lipase, CT Abdomen Pelvis W Contrast  Status post appendectomy, follow-up exam - Plan: Basic metabolic panel, CBC with Differential/Platelet, Hepatic function panel, Lipase, CT Abdomen Pelvis W Contrast  tthe patient had an appendectomy one month ago in [place].  He presents today with worsening indolent pain in the right lower  quadrant and to a lesser extent in the hypogastric region.  He is currently not having any fever and he is able to eat and drink, but has had worsening pain, notably over the last few days.  Given the patient's timing of recent appendectomy and physical examination, obtain a CT of the abdomen and pelvis with contrast to evaluate for abscess or potentially free air or other complication postoperative.  Follow-up: depending on imaging  Orders Placed This Encounter  Procedures  . CT Abdomen Pelvis W Contrast  . Basic metabolic panel  . CBC with Differential/Platelet  . Hepatic function panel  . Lipase    Signed,  Goldie Tregoning T. Antonio Woodhams, MD   Patient's Medications  New Prescriptions   No medications on file  Previous Medications   ALBUTEROL (PROVENTIL HFA;VENTOLIN HFA) 108 (90 BASE) MCG/ACT INHALER    Inhale 2 puffs into the lungs every 6 (six) hours as needed.   ASPIRIN 81 MG TABLET    Take 81 mg by mouth daily.    GEMFIBROZIL (LOPID) 600 MG TABLET    TAKE 1 TABLET TWICE DAILY BEFORE A MEAL   LISINOPRIL (PRINIVIL,ZESTRIL) 20 MG TABLET    Take 1 tablet (20 mg total) by mouth daily.   PSYLLIUM (METAMUCIL) 58.6 % POWDER    Take 1 packet by mouth daily.  Modified Medications   No medications on file  Discontinued Medications   No medications on file

## 2015-06-18 ENCOUNTER — Ambulatory Visit (INDEPENDENT_AMBULATORY_CARE_PROVIDER_SITE_OTHER)
Admission: RE | Admit: 2015-06-18 | Discharge: 2015-06-18 | Disposition: A | Discharge status: Home / Self Care | Payer: Commercial Managed Care - HMO | Source: Ambulatory Visit | Attending: Family Medicine | Admitting: Family Medicine

## 2015-06-18 DIAGNOSIS — K8020 Calculus of gallbladder without cholecystitis without obstruction: Secondary | ICD-10-CM | POA: Diagnosis not present

## 2015-06-18 DIAGNOSIS — R1031 Right lower quadrant pain: Secondary | ICD-10-CM | POA: Diagnosis not present

## 2015-06-18 DIAGNOSIS — Z09 Encounter for follow-up examination after completed treatment for conditions other than malignant neoplasm: Secondary | ICD-10-CM

## 2015-06-18 MED ORDER — IOHEXOL 300 MG/ML  SOLN
100.0000 mL | Freq: Once | INTRAMUSCULAR | Status: AC | PRN
  Administered 2015-06-18: 100 mL via INTRAVENOUS

## 2015-06-19 ENCOUNTER — Telehealth: Payer: Self-pay | Admitting: Family Medicine

## 2015-06-19 ENCOUNTER — Telehealth: Payer: Self-pay | Admitting: *Deleted

## 2015-06-19 MED ORDER — AMOXICILLIN-POT CLAVULANATE 875-125 MG PO TABS: 1.0000 | ORAL_TABLET | Freq: Two times a day (BID) | ORAL | Status: DC

## 2015-06-19 NOTE — Telephone Encounter (Signed)
Pt is requesting a call back from Dr Diona Browner personally regarding his antibiotic. I offered to have him speak to triage, and he refused.  He does not want to speak with Dr Rometta Emery cma either. cb numbers are [phone] and [phone] Thank you

## 2015-06-19 NOTE — Telephone Encounter (Signed)
-----   Message from Owens Loffler, MD sent at 06/18/2015  5:28 PM EDT ----- i left a message on machine, basically all ok.   I still think he may have colitis vs diverticulitis not seen on CT, and that is the best bet. I would prefer to treat presumptively and monitor his progress.   Can you tell him this? Augmentin 875 mg, 1 po bid, #20, 0 ref

## 2015-06-19 NOTE — Telephone Encounter (Signed)
Jack Norris notified as instructed by telephone.  Rx for Augmentin sent into [facility].

## 2015-06-19 NOTE — Telephone Encounter (Signed)
Called both numbers, LMOM. No answer from patient.   Will include Dr. B in case she would like to call or attempt as requested.

## 2015-06-20 NOTE — Telephone Encounter (Signed)
I will  call him tommorow when I am in office. Please make contact today to see if it needs to be taken care of today or if Dr.C can answer the question.

## 2015-06-20 NOTE — Telephone Encounter (Signed)
Left message for Jack Norris that Dr. Diona Browner has been out of the office but will call him on Friday when she returns to clinic.

## 2015-06-21 NOTE — Telephone Encounter (Signed)
Discussed symptoms in detail with pt as well as concerns. GIven no sign of infeciton on CT ( normal) no sign of complication of appendectomy.Jack Norris He wishes to hold off on antibiotics.  Will instead try probiotic for possible abnormal bacterial flora gas etc following surgery and recent antibiotics.  Pt agreeable, and will call to u[pdate next week.  Pt will start antibiotics if worsening pain or fever.

## 2015-07-07 ENCOUNTER — Other Ambulatory Visit: Payer: Self-pay | Admitting: Family Medicine

## 2015-10-22 DIAGNOSIS — H35373 Puckering of macula, bilateral: Secondary | ICD-10-CM | POA: Diagnosis not present

## 2015-10-22 DIAGNOSIS — H2513 Age-related nuclear cataract, bilateral: Secondary | ICD-10-CM | POA: Diagnosis not present

## 2015-11-18 DIAGNOSIS — J45901 Unspecified asthma with (acute) exacerbation: Secondary | ICD-10-CM | POA: Diagnosis not present

## 2015-11-18 DIAGNOSIS — J069 Acute upper respiratory infection, unspecified: Secondary | ICD-10-CM | POA: Diagnosis not present

## 2015-11-18 DIAGNOSIS — I10 Essential (primary) hypertension: Secondary | ICD-10-CM | POA: Diagnosis not present

## 2015-11-21 ENCOUNTER — Ambulatory Visit (INDEPENDENT_AMBULATORY_CARE_PROVIDER_SITE_OTHER): Payer: Commercial Managed Care - HMO | Admitting: Family Medicine

## 2015-11-21 ENCOUNTER — Encounter: Payer: Self-pay | Admitting: Family Medicine

## 2015-11-21 VITALS — BP 140/76 | HR 83 | Temp 98.0°F | Wt 209.0 lb

## 2015-11-21 DIAGNOSIS — J988 Other specified respiratory disorders: Secondary | ICD-10-CM | POA: Diagnosis not present

## 2015-11-21 NOTE — Progress Notes (Signed)
Subjective:  Patient ID: Jack Norris, male    DOB: 1941/06/26  Age: 75 y.o. MRN: [id number]  CC: ST, Respiratory infection  HPI:  75 year old male with a history of asthma presents with the above complaints.  Patient states that he's been sick since this past weekend. His symptoms initially started with sore throat. He was seen at a local urgent care on Monday and was given Augmentin. He states that he developed fever, sinus pain and pressure as well as congestion and cough despite the antibiotic. He states that this is slowly been improving. He is now taking Zyrtec, Mucinex, ibuprofen, and the antibiotic. He states that his symptoms are currently improved. He still reports some fatigue and body aches and decreased appetite. No known exacerbating factors. No other complaints at this time.  Social Hx   Social History   Social History  . Marital Status: Married    Spouse Name: N/A  . Number of Children: 3  . Years of Education: N/A   Occupational History  . 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 Smoking status: Former Smoker -- 8 years    Types: Cigarettes    Quit date: 01/05/1967  . Smokeless tobacco: Never Used     Comment: QUIT AGE 77  . Alcohol Use: 0.0 oz/week    0 Standard drinks or equivalent per week     Comment: OCCASIONAL  . Drug Use: No  . Sexual Activity: Not Asked   Other Topics Concern  . None   Social History Narrative   regular exercise    Full code, (Reviewed 2014)   Review of Systems  Constitutional: Positive for fever.  HENT: Positive for congestion and sore throat.   Respiratory: Positive for cough and chest tightness.   Musculoskeletal:       Body aches.   Objective:  BP 140/76 mmHg  Pulse 83  Temp(Src) 98 F (36.7 C) (Oral)  Wt 209 lb (94.802 kg)  SpO2 97%  BP/Weight 11/21/2015 06/17/2015 [id number]  Systolic BP [id number]  Diastolic BP 76 60 70  Wt. (Lbs) 209 206.5 195.75  BMI 30.85 30.48 28.89    Physical Exam  Constitutional: He is oriented to person, place, and time. He appears well-developed. No distress.  HENT:  Head: Normocephalic and atraumatic.  Normal TMs. Oropharynx with mild erythema. No exudate.  Eyes: Conjunctivae are normal.  Cardiovascular: Normal rate and regular rhythm.   Pulmonary/Chest: Effort normal and breath sounds normal. No respiratory distress. He has no wheezes. He has no rales.  Neurological: He is alert and oriented to person, place, and time.  Psychiatric: He has a normal mood and affect.  Vitals reviewed.  Lab Results  Component Value Date   WBC 7.1 06/17/2015   HGB 14.1 06/17/2015   HCT 41.8 06/17/2015   PLT 297.0 06/17/2015   GLUCOSE 87 06/17/2015   CHOL 156 04/09/2015   TRIG 56.0 04/09/2015   HDL 37.80* 04/09/2015   LDLDIRECT 107.6 12/14/2011   LDLCALC 107* 04/09/2015   ALT 14 06/17/2015   AST 16 06/17/2015   NA 141 06/17/2015   K 4.3 06/17/2015   CL 105 06/17/2015   CREATININE 0.99 06/17/2015   BUN 16 06/17/2015   CO2 28 06/17/2015   TSH 0.62 01/27/2013   PSA 1.17 12/14/2011    Assessment & Plan:   Problem List Items Addressed This Visit    Respiratory infection - Primary    New problem. Likely viral in origin.  He is improving currently. His primary concern is that he is traveling out of the country was to make sure he is cleared to do that. As far as I'm concerned he is cleared to travel. Advise continue use of Zyrtec, Mucinex, ibuprofen. I doubt this is bacterial. I informed her that he could stop the antibiotics or finish the course if he desires.        Follow-up: PRN  D[place]

## 2015-11-21 NOTE — Assessment & Plan Note (Signed)
New problem. Likely viral in origin. He is improving currently. His primary concern is that he is traveling out of the country was to make sure he is cleared to do that. As far as I'm concerned he is cleared to travel. Advise continue use of Zyrtec, Mucinex, ibuprofen. I doubt this is bacterial. I informed her that he could stop the antibiotics or finish the course if he desires.

## 2015-11-21 NOTE — Progress Notes (Signed)
Pre visit review using our clinic review tool, if applicable. No additional management support is needed unless otherwise documented below in the visit note. 

## 2015-11-21 NOTE — Patient Instructions (Signed)
Continue your current therapy.  You're fine to travel.  Let your PCP know if you worsen.  Take care  Dr. Lacinda Axon

## 2016-01-01 DIAGNOSIS — L570 Actinic keratosis: Secondary | ICD-10-CM | POA: Diagnosis not present

## 2016-01-01 DIAGNOSIS — B351 Tinea unguium: Secondary | ICD-10-CM | POA: Diagnosis not present

## 2016-01-01 DIAGNOSIS — Z1283 Encounter for screening for malignant neoplasm of skin: Secondary | ICD-10-CM | POA: Diagnosis not present

## 2016-01-01 DIAGNOSIS — Z872 Personal history of diseases of the skin and subcutaneous tissue: Secondary | ICD-10-CM | POA: Diagnosis not present

## 2016-01-05 IMAGING — CT CT ABD-PELV W/ CM
2 of 5 series · 17 of 46 positions shown, 19 images · IV contrast (Omnipaque 300)
Comparison: CT scan of July 14, 2013.

CLINICAL DATA: Right lower quadrant abdominal pain 1 week.

EXAM:
CT ABDOMEN AND PELVIS WITH CONTRAST
TECHNIQUE: Multidetector CT imaging of the abdomen and pelvis was performed
using the standard protocol following bolus administration of
intravenous contrast.
CONTRAST:  100mL OMNIPAQUE IOHEXOL 300 MG/ML  SOLN

[Series 2: abd/ pel 5mm · axial · 0.77mm/px · z∈[-512,-102]mm · 14 of 92 slices shown, 16 images]
[im 5/92  soft-tissue]
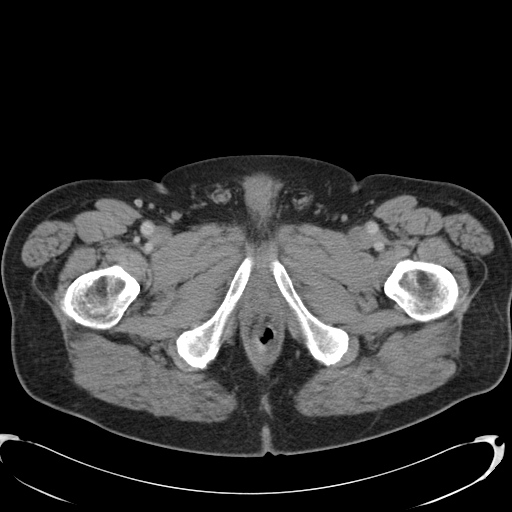
[im 5/92  bone]
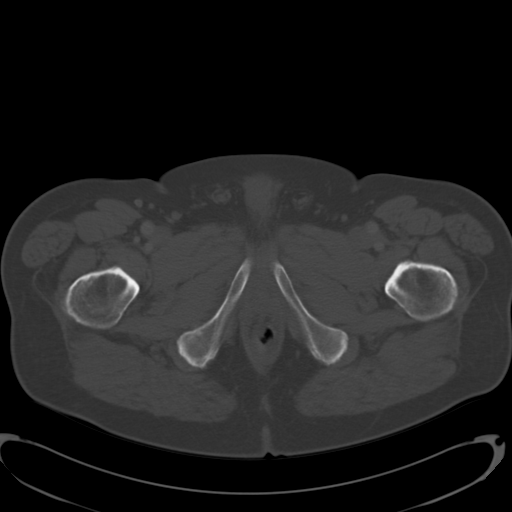
[im 10/92  soft-tissue]
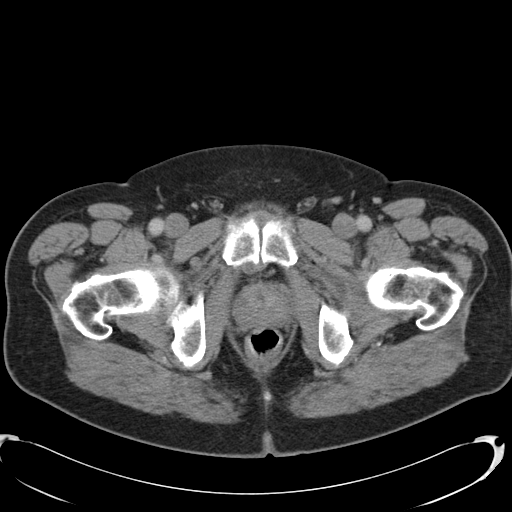
[im 20/92  soft-tissue]
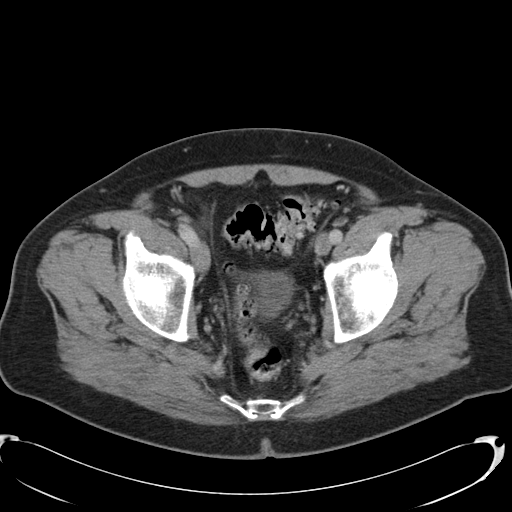
[im 24/92  soft-tissue]
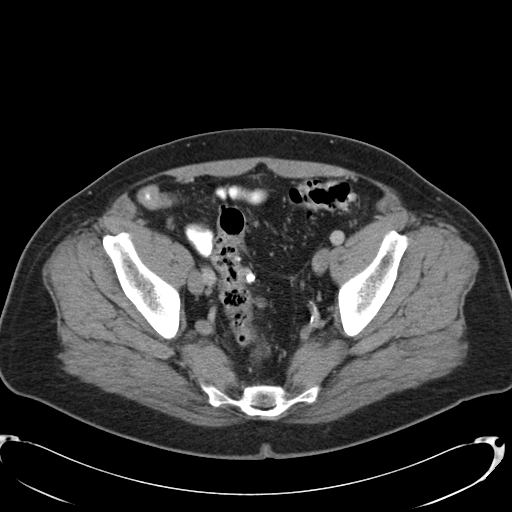
[im 29/92  soft-tissue]
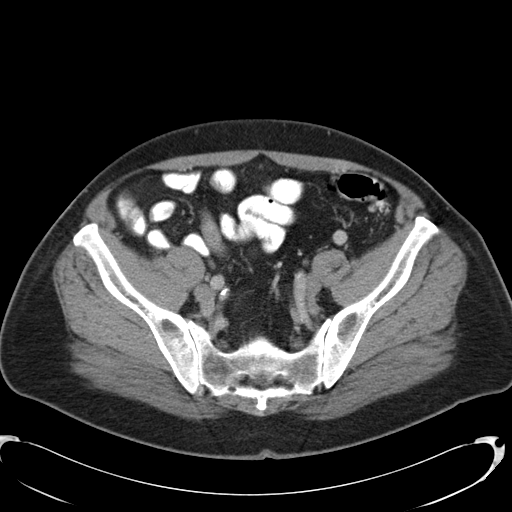
[im 39/92  soft-tissue]
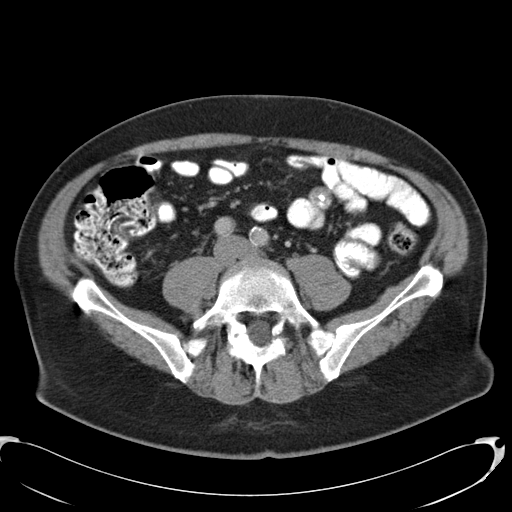
[im 44/92  soft-tissue]
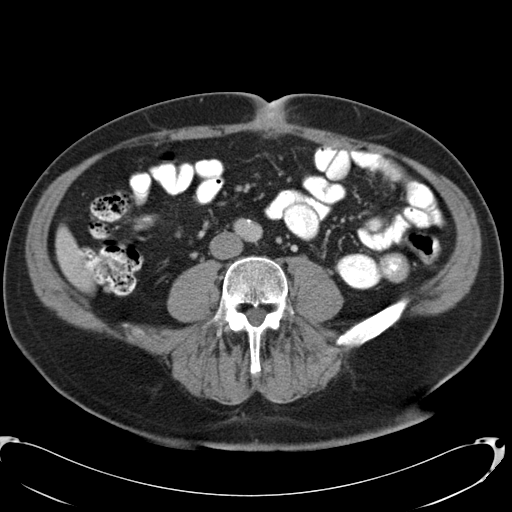
[im 48/92  soft-tissue]
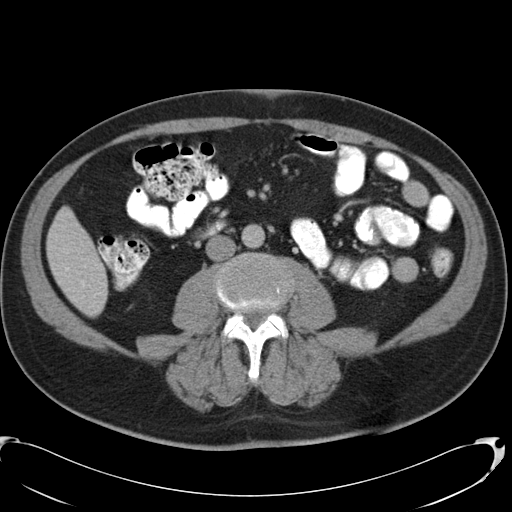
[im 53/92  soft-tissue]
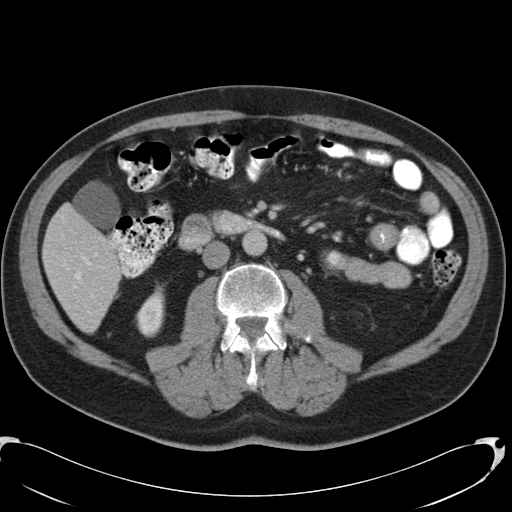
[im 53/92  bone]
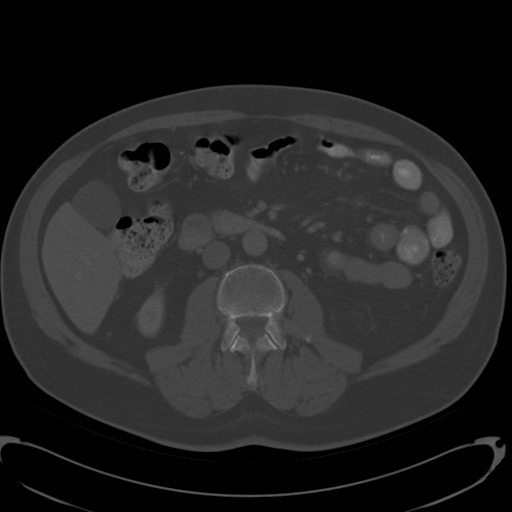
[im 63/92  soft-tissue]
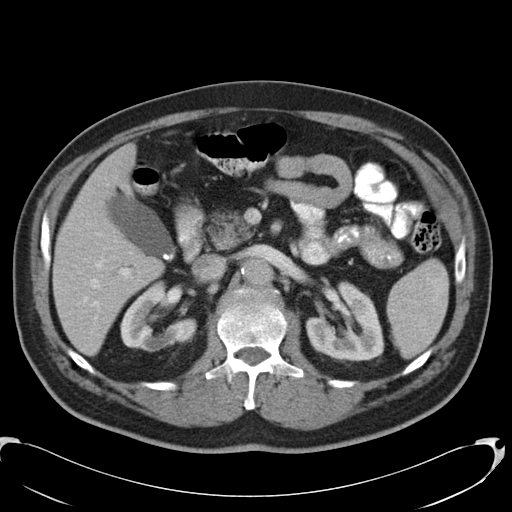
[im 68/92  soft-tissue]
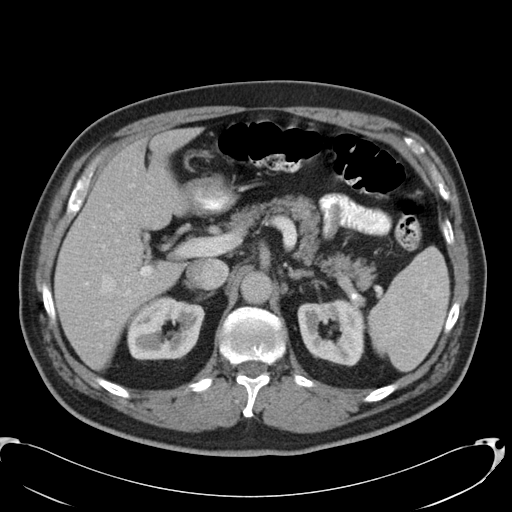
[im 72/92  soft-tissue]
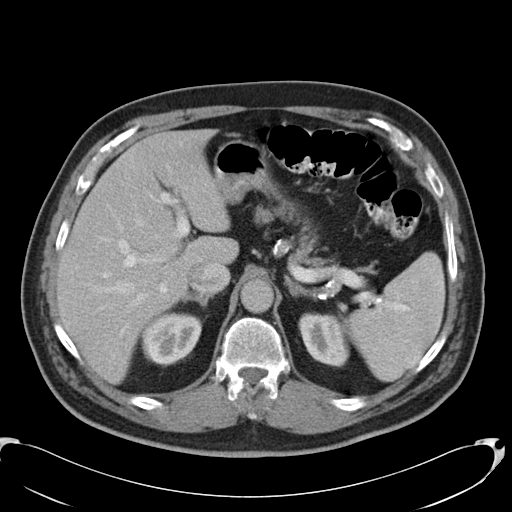
[im 82/92  soft-tissue]
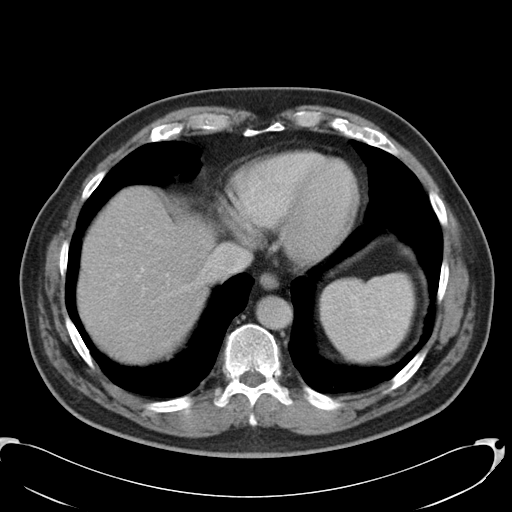
[im 87/92  soft-tissue]
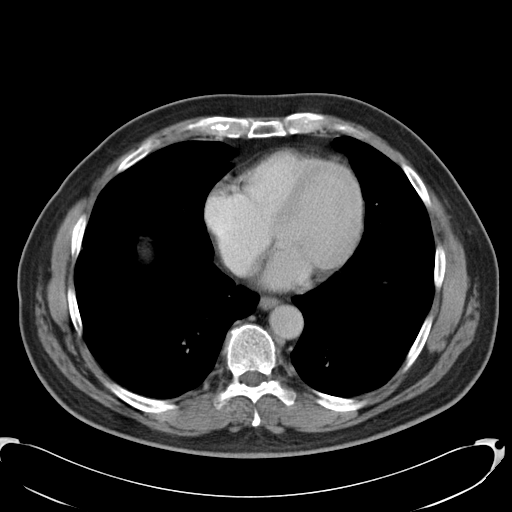

[Series 602: cor · coronal · 0.93mm/px · 3 of 137 slices shown]
[im 46/137  soft-tissue]
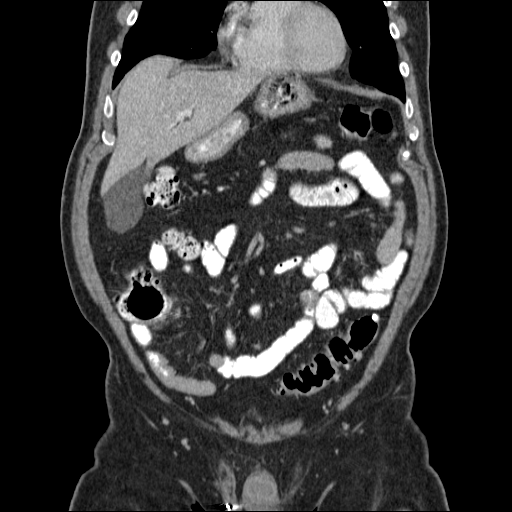
[im 61/137  soft-tissue]
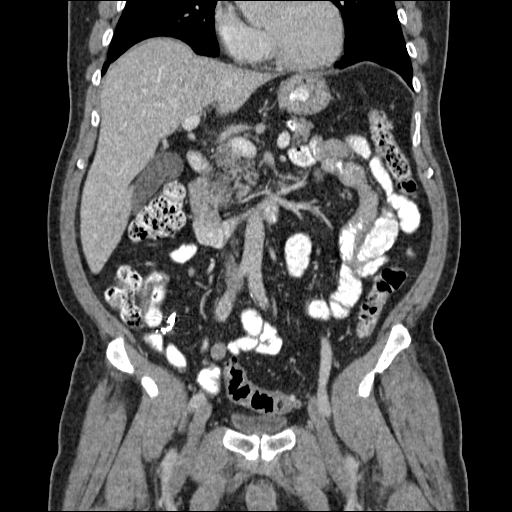
[im 76/137  soft-tissue]
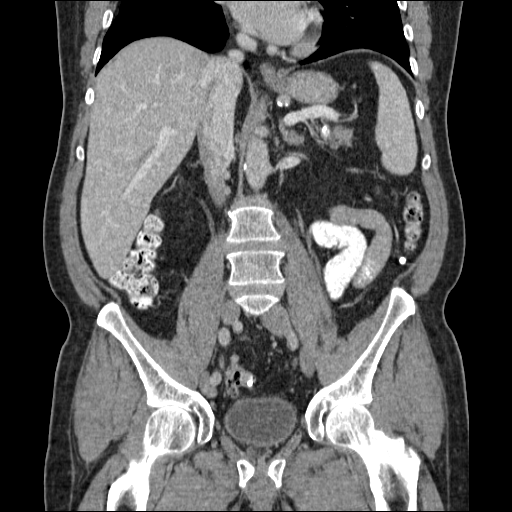

[17 of 46 positions shown; findings below may reference images not displayed]

FINDINGS: Bilateral pars defects are seen at L5. Visualized lung bases are
unremarkable.

Small gallstones are noted. The liver, spleen and pancreas appear
normal. Adrenal glands appear normal. Scarring of mid pole of right
kidney is noted. Left kidney appears normal. No hydronephrosis or
renal obstruction is noted. No renal or ureteral calculi noted.
Status post appendectomy. There is no evidence of bowel obstruction.
No abnormal fluid collection is noted. Sigmoid diverticulosis is
noted without inflammation. Urinary bladder appears normal. No
significant adenopathy is noted. There is no evidence of abdominal
aortic aneurysm. No significant adenopathy is noted.
IMPRESSION: Minimal cholelithiasis.

Sigmoid diverticulosis without inflammation.

Status post appendectomy. No evidence of bowel obstruction or
abnormal fluid collection is noted.

## 2016-02-19 DIAGNOSIS — L821 Other seborrheic keratosis: Secondary | ICD-10-CM | POA: Diagnosis not present

## 2016-03-02 ENCOUNTER — Telehealth: Payer: Self-pay | Admitting: Family Medicine

## 2016-03-02 NOTE — Telephone Encounter (Signed)
Lm for pt to sch AWV on 8/24 at 1pm, before CPE, [place]n

## 2016-04-03 ENCOUNTER — Telehealth: Payer: Self-pay | Admitting: Family Medicine

## 2016-04-03 DIAGNOSIS — E349 Endocrine disorder, unspecified: Secondary | ICD-10-CM

## 2016-04-03 DIAGNOSIS — E7800 Pure hypercholesterolemia, unspecified: Secondary | ICD-10-CM

## 2016-04-03 NOTE — Telephone Encounter (Signed)
-----   Message from Marchia Bond sent at 04/01/2016  3:09 PM EDT ----- Regarding: Cpx labs Tues 8/15, need orders. Thanks! :-) Please order  future cpx labs for pt's upcoming lab appt. Thanks Aniceto Boss

## 2016-04-07 ENCOUNTER — Other Ambulatory Visit (INDEPENDENT_AMBULATORY_CARE_PROVIDER_SITE_OTHER): Payer: Commercial Managed Care - HMO

## 2016-04-07 DIAGNOSIS — E7800 Pure hypercholesterolemia, unspecified: Secondary | ICD-10-CM

## 2016-04-07 LAB — LIPID PANEL
Cholesterol: 172 mg/dL (ref 0–200)
HDL: 36.8 mg/dL — ABNORMAL LOW (ref >39.00)
LDL Cholesterol: 114 mg/dL — ABNORMAL HIGH (ref 0–99)
NONHDL: 134.72
Total CHOL/HDL Ratio: 5
Triglycerides: 102 mg/dL (ref 0.0–149.0)
VLDL: 20.4 mg/dL (ref 0.0–40.0)

## 2016-04-07 LAB — COMPREHENSIVE METABOLIC PANEL
ALK PHOS: 82 U/L (ref 39–117)
ALT: 8 U/L (ref 0–53)
AST: 13 U/L (ref 0–37)
Albumin: 4.4 g/dL (ref 3.5–5.2)
BILIRUBIN TOTAL: 0.6 mg/dL (ref 0.2–1.2)
BUN: 19 mg/dL (ref 6–23)
CO2: 28 mEq/L (ref 19–32)
Calcium: 9.7 mg/dL (ref 8.4–10.5)
Chloride: 105 mEq/L (ref 96–112)
Creatinine, Ser: 1.11 mg/dL (ref 0.40–1.50)
GFR: 68.63 mL/min (ref >60.00)
GLUCOSE: 91 mg/dL (ref 70–99)
Potassium: 4.5 mEq/L (ref 3.5–5.1)
SODIUM: 140 meq/L (ref 135–145)
TOTAL PROTEIN: 7.2 g/dL (ref 6.0–8.3)

## 2016-04-16 ENCOUNTER — Ambulatory Visit (INDEPENDENT_AMBULATORY_CARE_PROVIDER_SITE_OTHER): Payer: Commercial Managed Care - HMO | Admitting: Family Medicine

## 2016-04-16 ENCOUNTER — Ambulatory Visit (INDEPENDENT_AMBULATORY_CARE_PROVIDER_SITE_OTHER): Payer: Commercial Managed Care - HMO

## 2016-04-16 ENCOUNTER — Encounter: Payer: Self-pay | Admitting: Family Medicine

## 2016-04-16 VITALS — BP 116/68 | HR 91 | Temp 98.5°F | Ht 69.0 in | Wt 199.5 lb

## 2016-04-16 DIAGNOSIS — Z0000 Encounter for general adult medical examination without abnormal findings: Secondary | ICD-10-CM

## 2016-04-16 DIAGNOSIS — E8881 Metabolic syndrome: Secondary | ICD-10-CM | POA: Insufficient documentation

## 2016-04-16 DIAGNOSIS — R5382 Chronic fatigue, unspecified: Secondary | ICD-10-CM | POA: Diagnosis not present

## 2016-04-16 DIAGNOSIS — R413 Other amnesia: Secondary | ICD-10-CM | POA: Diagnosis not present

## 2016-04-16 DIAGNOSIS — E7800 Pure hypercholesterolemia, unspecified: Secondary | ICD-10-CM

## 2016-04-16 DIAGNOSIS — Z23 Encounter for immunization: Secondary | ICD-10-CM

## 2016-04-16 DIAGNOSIS — E781 Pure hyperglyceridemia: Secondary | ICD-10-CM

## 2016-04-16 DIAGNOSIS — H9193 Unspecified hearing loss, bilateral: Secondary | ICD-10-CM

## 2016-04-16 DIAGNOSIS — K760 Fatty (change of) liver, not elsewhere classified: Secondary | ICD-10-CM

## 2016-04-16 DIAGNOSIS — I10 Essential (primary) hypertension: Secondary | ICD-10-CM

## 2016-04-16 DIAGNOSIS — N400 Enlarged prostate without lower urinary tract symptoms: Secondary | ICD-10-CM

## 2016-04-16 LAB — CBC WITH DIFFERENTIAL/PLATELET
BASOS PCT: 0.4 % (ref 0.0–3.0)
Basophils Absolute: 0 10*3/uL (ref 0.0–0.1)
EOS ABS: 0.2 10*3/uL (ref 0.0–0.7)
Eosinophils Relative: 2 % (ref 0.0–5.0)
HCT: 40.1 % (ref 39.0–52.0)
Hemoglobin: 13.7 g/dL (ref 13.0–17.0)
Lymphocytes Relative: 18.6 % (ref 12.0–46.0)
Lymphs Abs: 1.5 10*3/uL (ref 0.7–4.0)
MCHC: 34.2 g/dL (ref 30.0–36.0)
MCV: 82.3 fl (ref 78.0–100.0)
MONO ABS: 0.4 10*3/uL (ref 0.1–1.0)
Monocytes Relative: 5.4 % (ref 3.0–12.0)
NEUTROS ABS: 5.7 10*3/uL (ref 1.4–7.7)
Neutrophils Relative %: 73.6 % (ref 43.0–77.0)
PLATELETS: 258 10*3/uL (ref 150.0–400.0)
RBC: 4.88 Mil/uL (ref 4.22–5.81)
RDW: 14.2 % (ref 11.5–15.5)
WBC: 7.8 10*3/uL (ref 4.0–10.5)

## 2016-04-16 LAB — VITAMIN B12: Vitamin B-12: 258 pg/mL (ref 211–911)

## 2016-04-16 LAB — T4, FREE: FREE T4: 0.84 ng/dL (ref 0.60–1.60)

## 2016-04-16 LAB — VITAMIN D 25 HYDROXY (VIT D DEFICIENCY, FRACTURES): VITD: 39.42 ng/mL (ref 30.00–100.00)

## 2016-04-16 LAB — T3, FREE: T3 FREE: 2.8 pg/mL (ref 2.3–4.2)

## 2016-04-16 LAB — TSH: TSH: 0.89 u[IU]/mL (ref 0.35–4.50)

## 2016-04-16 MED ORDER — LISINOPRIL 20 MG PO TABS: 20.0000 mg | ORAL_TABLET | Freq: Every day | ORAL | 3 refills | Status: DC

## 2016-04-16 MED ORDER — GEMFIBROZIL 600 MG PO TABS: 600.0000 mg | ORAL_TABLET | Freq: Every day | ORAL | 3 refills | Status: DC

## 2016-04-16 NOTE — Progress Notes (Signed)
Pre visit review using our clinic review tool, if applicable. No additional management support is needed unless otherwise documented below in the visit note. 

## 2016-04-16 NOTE — Patient Instructions (Addendum)
Stop at lab on way out.  Stopat front desk for audoiologist.

## 2016-04-16 NOTE — Assessment & Plan Note (Signed)
Well controlled. Continue current medication. Encouraged exercise, weight loss, healthy eating habits.  

## 2016-04-16 NOTE — Assessment & Plan Note (Signed)
Well controlled. Continue current medication.  

## 2016-04-16 NOTE — Assessment & Plan Note (Addendum)
Requests referral to audiologist.

## 2016-04-16 NOTE — Assessment & Plan Note (Signed)
Stable on AMW mental status exam.

## 2016-04-16 NOTE — Progress Notes (Signed)
I reviewed health advisor's note, was available for consultation, and agree with documentation and plan.  Tanequa Kretz, MD [facility]  

## 2016-04-16 NOTE — Progress Notes (Signed)
75 year old male presents for AMW PART 2. Earlier today she saw Candis Musa, LPN for medicare wellness. Note reviewed in detail when completed. Flu vaccine - addressed Hearing - failed Patient concerns:  Pt states he feels his BP has become elevated secondary to stress.  Hypertension: At goal on lisinopril  BP Readings from Last 3 Encounters:  04/16/16 116/68  04/16/16 116/68  11/21/15 140/76  Using medication without problems or lightheadedness: None  Chest pain with exertion: None  Edema:None  Short of breath:None  Average home BPs: 150/70 recently due to [facility] board stress.  Other issues:   He has been more tired in last year. Sleeping well at night.  No nocturia. No bleeding.  Elevated Cholesterol: LDL at goal < 130 on gemfibrozil. He has stopped niacin due to cost. Lab Results  Component Value Date   CHOL 172 04/07/2016   HDL 36.80 (L) 04/07/2016   LDLCALC 114 (H) 04/07/2016   LDLDIRECT 107.6 12/14/2011   TRIG 102.0 04/07/2016   CHOLHDL 5 04/07/2016  Using medications without problems: None Muscle aches: None  Diet compliance: moderate, using weight watchers Exercise: walking 3-4 times a week  Other complaints:  Wt Readings from Last 3 Encounters:  04/16/16 199 lb 8 oz (90.5 kg)  04/16/16 199 lb 8 oz (90.5 kg)  11/21/15 209 lb (94.8 kg)   Body mass index is 29.46 kg/m.  Social History /Family History/Past Medical History reviewed and updated if needed.          Social History   .  Marital Status:  Married     Spouse Name:  N/A     Number of Children:  3   .  Years of Education:  N/A        Occupational History   .  QUOTATION ENGINEER  [employer]          Social History Main Topics   .  Smoking status:  Former Smoker -- 8 years     Types:  Cigarettes     Quit date:  01/05/1967   .  Smokeless tobacco:  Never Used      Comment: QUIT AGE 46   .  Alcohol Use:  Yes       Comment: OCCASIONAL   .  Drug Use:  No   .  Sexually Active:  None    Review of Systems  Constitutional: Negative for fever and fatigue.  HENT: Negative for ear pain.  Eyes: Negative for pain.  Respiratory: Negative for shortness of breath.  Cardiovascular: Negative for chest pain, palpitations and leg swelling.  Gastrointestinal: Negative for abdominal pain.  Endocrine: Negative for polyuria.  Genitourinary: Negative for hematuria.  Objective:   Physical Exam  Constitutional: He is oriented to person, place, and time. Vital signs are normal. He appears well-developed and well-nourished. Central obesity HENT:  Head: Normocephalic.  Right Ear: Hearing diminished, moderate cerumen, no impaction. Left Ear: Hearing  diminished Nose: Nose normal.  Mouth/Throat: Oropharynx is clear and moist and mucous membranes are normal.  Neck: Trachea normal. Carotid bruit is not present. No mass and no thyromegaly present.  Cardiovascular: Normal rate, regular rhythm and normal pulses. Exam reveals no gallop, no distant heart sounds and no friction rub.  No murmur heard. No peripheral edema  Pulmonary/Chest: Effort normal and breath sounds normal. No respiratory distress.  Neurological: He is alert and oriented to person, place, and time. He has normal reflexes. He displays normal reflexes. No cranial nerve deficit. Coordination  normal.   Abdomen: Slight ttp R lower quadrant where he had appendectomy in 2016. Skin: Skin is warm, dry and intact. No rash noted.  Psychiatric: He has a normal mood and affect. His speech is normal and behavior is normal. Thought content normal.  Assessment & Plan:   The patient's preventative maintenance and recommended screening tests for an annual wellness exam were reviewed in full today.  Brought up to date unless services declined.  Counselled on the importance of diet, exercise, and its role in overall health and mortality.  The  patient's FH and SH was reviewed, including their home life, tobacco status, and drug and alcohol status.   Vaccines: Uptodate given flu today. Colon: 05/2013, [place] nml, no further indicated.  Prostate: per uro. PSA  Followed there Has upcoming appt with Dr. Diona Fanti  tomorow. Former smoker, remotely.

## 2016-04-16 NOTE — Assessment & Plan Note (Signed)
Encouraged exercise, weight loss, healthy eating habits. ? ?

## 2016-04-16 NOTE — Assessment & Plan Note (Signed)
Not interfering with sleep. Followed by [facility].

## 2016-04-16 NOTE — Progress Notes (Signed)
Subjective:   Jack Norris is a 75 y.o. male who presents for Medicare Annual/Subsequent preventive examination.  Review of Systems:  N/A Cardiac Risk Factors include: advanced age (>29men, >88 women);male gender;hypertension;dyslipidemia     Objective:    Vitals: BP 116/68 (BP Location: Left Arm, Patient Position: Sitting, Cuff Size: Normal)   Pulse 91   Temp 98.5 F (36.9 C) (Oral)   Ht 5\' 9"  (1.753 m) Comment: no shoes  Wt 199 lb 8 oz (90.5 kg)   SpO2 97%   BMI 29.46 kg/m   Body mass index is 29.46 kg/m.  Tobacco History  Smoking Status  . Former Smoker  . Years: 8.00  . Types: Cigarettes  . Quit date: 01/05/1967  Smokeless Tobacco  . Never Used    Comment: QUIT AGE 55     Counseling given: No   Past Medical History:  Diagnosis Date  . Arthritis   . Asthma, mild    as child-only now if has URI  . BPH (benign prostatic hypertrophy)   . Eczema   . History of kidney stones   . Hypertension   . Reflux OCCASIONAL-  WATCHES DIET  . Renal calculi right    AND LEFT X1 NON-OBSTRUCTIVE  . Simple renal cyst right interpoler, simpler in nature , asymptomatic  per urologist note (dr dahlstedt)  12-26-2011   BENIGN  . Wears glasses    Past Surgical History:  Procedure Laterality Date  . bone spur removal     BILATERAL GREAT TOE  . CYSTO/ RIGHT RETROGRADE PYELOGRAM/ RIGHT URETER DILATATION/ RIGHT URETEROSCOPY/ LASER LITHOTRIPSY RIGHT RENAL PELVIS AND LOWER POLE CALCULI/ PLACEMENT STENT  01-11-2012  DR DAHLSTEDT   RIGHT RENAL PELVIS AND LOWER POLE CALCULI  . CYSTOSCOPY W/ RETROGRADES  06/02/2012   Procedure: CYSTOSCOPY WITH RETROGRADE PYELOGRAM;  Surgeon: Franchot Gallo, MD;  Location: [facility];  Service: Urology;  Laterality: Right;  . CYSTOSCOPY WITH URETEROSCOPY  06/02/2012   Procedure: CYSTOSCOPY WITH URETEROSCOPY;  Surgeon: Franchot Gallo, MD;  Location: [facility];  Service: Urology;  Laterality: Right;  90  MIN ALSO EXTRACTION OF CALCULI   . LITHOTRIPSY  APPROX.  1998   ESWL  left kidney x 2  . ROTATOR CUFF REPAIR  2012   LEFT SHOULDER  . SHOULDER ARTHROSCOPY WITH ROTATOR CUFF REPAIR AND SUBACROMIAL DECOMPRESSION  09/12/2012   Procedure: SHOULDER ARTHROSCOPY WITH ROTATOR CUFF REPAIR AND SUBACROMIAL DECOMPRESSION;  Surgeon: Nita Sells, MD;  Location: [facility];  Service: Orthopedics;  Laterality: Right;  see above and biceps tendonotomy  . TRANSURETHRAL RESECTION OF PROSTATE  11-29-2009   BPH  . VASECTOMY  1970'S   GEN. ANES.   Family History  Problem Relation Age of Onset  . Cancer Mother     breast and kidney   . Squamous cell carcinoma Mother   . Cancer Father     lung  . Heart disease Father    History  Sexual Activity  . Sexual activity: Yes    Outpatient Encounter Prescriptions as of 04/16/2016  Medication Sig  . albuterol (PROVENTIL HFA;VENTOLIN HFA) 108 (90 BASE) MCG/ACT inhaler Inhale 2 puffs into the lungs every 6 (six) hours as needed.  Marland Kitchen aspirin 81 MG tablet Take 81 mg by mouth daily.   Marland Kitchen gemfibrozil (LOPID) 600 MG tablet TAKE 1 TABLET TWICE DAILY BEFORE A MEAL (Patient taking differently: TAKE 1 TABLET ONCE DAILY)  . lisinopril (PRINIVIL,ZESTRIL) 20 MG tablet Take 1 tablet (20  mg total) by mouth daily.  . psyllium (METAMUCIL) 58.6 % powder Take 1 packet by mouth daily.  . [DISCONTINUED] amoxicillin-clavulanate (AUGMENTIN) 875-125 MG tablet Take 1 tablet by mouth 2 (two) times daily.   No facility-administered encounter medications on file as of 04/16/2016.     Activities of Daily Living In your present state of health, do you have any difficulty performing the following activities: 04/16/2016  Hearing? Y  Vision? N  Difficulty concentrating or making decisions? Y  Walking or climbing stairs? N  Dressing or bathing? N  Doing errands, shopping? N  Preparing Food and eating ? N  Using the Toilet? N  In the past six months, have you  accidently leaked urine? N  Do you have problems with loss of bowel control? N  Managing your Medications? N  Managing your Finances? N  Housekeeping or managing your Housekeeping? N  Some recent data might be hidden    Patient Care Team: Jinny Sanders, MD as PCP - General Clent Jacks, MD as Consulting Physician (Ophthalmology) Franchot Gallo, MD as Consulting Physician (Urology) Jannet Mantis, MD as Consulting Physician (Dermatology)   Assessment:       Hearing Screening   125Hz  250Hz  500Hz  1000Hz  2000Hz  3000Hz  4000Hz  6000Hz  8000Hz   Right ear:   0 40 40  0    Left ear:   40 40 40  0    Vision Screening Comments: Last vision exam in Jan 2017 with Dr. Clent Jacks   Exercise Activities and Dietary recommendations Current Exercise Habits: Home exercise routine, Type of exercise: walking, Time (Minutes): 60, Frequency (Times/Week): 5, Weekly Exercise (Minutes/Week): 300, Intensity: Moderate, Exercise limited by: None identified  Goals    . Increase physical activity          Starting 04/16/2016, I will continue to walk at least 60 min 5 days.       Fall Risk Fall Risk  04/16/2016 04/15/2015 02/06/2014 01/27/2013  Falls in the past year? No No No No   Depression Screen PHQ 2/9 Scores 04/16/2016 04/15/2015 02/06/2014 01/27/2013  PHQ - 2 Score 0 0 0 0    Cognitive Testing MMSE - Mini Mental State Exam 04/16/2016  Orientation to time 5  Orientation to Place 5  Registration 3  Attention/ Calculation 0  Recall 3  Language- name 2 objects 0  Language- repeat 1  Language- follow 3 step command 3  Language- read & follow direction 0  Write a sentence 0  Copy design 0  Total score 20   PLEASE NOTE: A Mini-Cog screen was completed. Maximum score is 20. A value of 0 denotes this part of Folstein MMSE was not completed or the patient failed this part of the Mini-Cog screening.   Mini-Cog Screening Orientation to Time - Max 5 pts Orientation to Place - Max 5  pts Registration - Max 3 pts Recall - Max 3 pts Language Repeat - Max 1 pts Language Follow 3 Step Command - Max 3 pts  Immunization History  Administered Date(s) Administered  . Influenza Split 06/26/2011, 07/11/2012  . Influenza Whole 05/24/2008, 06/24/2008, 07/04/2009, 05/24/2010  . Influenza,inj,Quad PF,36+ Mos 07/26/2013, 06/12/2014, 04/15/2015  . Pneumococcal Conjugate-13 04/15/2015  . Pneumococcal Polysaccharide-23 09/14/2007  . Td 08/02/2007  . Zoster 04/08/2009   Screening Tests Health Maintenance  Topic Date Due  . INFLUENZA VACCINE  08/23/2016 (Originally 03/24/2016)  . TETANUS/TDAP  08/01/2017  . COLONOSCOPY  06/20/2023  . ZOSTAVAX  Completed  . PNA vac Low Risk  Adult  Completed      Plan:     I have personally reviewed and addressed the Medicare Annual Wellness questionnaire and have noted the following in the patient's chart:  A. Medical and social history B. Use of alcohol, tobacco or illicit drugs  C. Current medications and supplements D. Functional ability and status E.  Nutritional status F.  Physical activity G. Advance directives H. List of other physicians I.  Hospitalizations, surgeries, and ER visits in previous 12 months J.  [place] to include hearing, vision, cognitive, depression L. Referrals and appointments - none  In addition, I have reviewed and discussed with patient certain preventive protocols, quality metrics, and best practice recommendations. A written personalized care plan for preventive services as well as general preventive health recommendations were provided to patient.  See attached scanned questionnaire for additional information.   Signed,   Lindell Noe, MHA, BS, LPN Health Advisor

## 2016-04-16 NOTE — Assessment & Plan Note (Signed)
NML LFTS. Encouraged exercise, weight loss, healthy eating habits.

## 2016-04-16 NOTE — Assessment & Plan Note (Signed)
Requests referral to audiologist

## 2016-04-16 NOTE — Progress Notes (Signed)
PCP notes:   Health maintenance:  Flu vaccine - addressed  Abnormal screenings:   Hearing - failed  Patient concerns:   Pt states he feels his BP has become elevated secondary to stress.  Nurse concerns:  None  Next PCP appt:   04/16/16 @ 1400

## 2016-04-16 NOTE — Patient Instructions (Signed)
Jack Norris , Thank you for taking time to come for your Medicare Wellness Visit. I appreciate your ongoing commitment to your health goals. Please review the following plan we discussed and let me know if I can assist you in the future.   These are the goals we discussed: Goals    . Increase physical activity          Starting 04/16/2016, I will continue to walk at least 60 min 5 days.        This is a list of the screening recommended for you and due dates:  Health Maintenance  Topic Date Due  . Flu Shot  08/23/2016*  . Tetanus Vaccine  08/01/2017  . Colon Cancer Screening  06/20/2023  . Shingles Vaccine  Completed  . Pneumonia vaccines  Completed  *Topic was postponed. The date shown is not the original due date.   Preventive Care for Adults  A healthy lifestyle and preventive care can promote health and wellness. Preventive health guidelines for adults include the following key practices.  . A routine yearly physical is a good way to check with your health care provider about your health and preventive screening. It is a chance to share any concerns and updates on your health and to receive a thorough exam.  . Visit your dentist for a routine exam and preventive care every 6 months. Brush your teeth twice a day and floss once a day. Good oral hygiene prevents tooth decay and gum disease.  . The frequency of eye exams is based on your age, health, family medical history, use  of contact lenses, and other factors. Follow your health care provider's ecommendations for frequency of eye exams.  . Eat a healthy diet. Foods like vegetables, fruits, whole grains, low-fat dairy products, and lean protein foods contain the nutrients you need without too many calories. Decrease your intake of foods high in solid fats, added sugars, and salt. Eat the right amount of calories for you. Get information about a proper diet from your health care provider, if necessary.  . Regular physical  exercise is one of the most important things you can do for your health. Most adults should get at least 150 minutes of moderate-intensity exercise (any activity that increases your heart rate and causes you to sweat) each week. In addition, most adults need muscle-strengthening exercises on 2 or more days a week.  Silver Sneakers may be a benefit available to you. To determine eligibility, you may visit the website: www.silversneakers.com or contact program at 307-544-4422 Mon-Fri between 8AM-8PM.   . Maintain a healthy weight. The body mass index (BMI) is a screening tool to identify possible weight problems. It provides an estimate of body fat based on height and weight. Your health care provider can find your BMI and can help you achieve or maintain a healthy weight.   For adults 20 years and older: ? A BMI below 18.5 is considered underweight. ? A BMI of 18.5 to 24.9 is normal. ? A BMI of 25 to 29.9 is considered overweight. ? A BMI of 30 and above is considered obese.   . Maintain normal blood lipids and cholesterol levels by exercising and minimizing your intake of saturated fat. Eat a balanced diet with plenty of fruit and vegetables. Blood tests for lipids and cholesterol should begin at age 87 and be repeated every 5 years. If your lipid or cholesterol levels are high, you are over 50, or you are at high risk for  heart disease, you may need your cholesterol levels checked more frequently. Ongoing high lipid and cholesterol levels should be treated with medicines if diet and exercise are not working.  . If you smoke, find out from your health care provider how to quit. If you do not use tobacco, please do not start.  . If you choose to drink alcohol, please do not consume more than 2 drinks per day. One drink is considered to be 12 ounces (355 mL) of beer, 5 ounces (148 mL) of wine, or 1.5 ounces (44 mL) of liquor.  . If you are 23-1 years old, ask your health care provider if you  should take aspirin to prevent strokes.  . Use sunscreen. Apply sunscreen liberally and repeatedly throughout the day. You should seek shade when your shadow is shorter than you. Protect yourself by wearing long sleeves, pants, a wide-brimmed hat, and sunglasses year round, whenever you are outdoors.  . Once a month, do a whole body skin exam, using a mirror to look at the skin on your back. Tell your health care provider of new moles, moles that have irregular borders, moles that are larger than a pencil eraser, or moles that have changed in shape or color.

## 2016-04-17 DIAGNOSIS — R972 Elevated prostate specific antigen [PSA]: Secondary | ICD-10-CM | POA: Diagnosis not present

## 2016-04-17 DIAGNOSIS — R3915 Urgency of urination: Secondary | ICD-10-CM | POA: Diagnosis not present

## 2016-04-17 DIAGNOSIS — N401 Enlarged prostate with lower urinary tract symptoms: Secondary | ICD-10-CM | POA: Diagnosis not present

## 2016-04-23 DIAGNOSIS — H2513 Age-related nuclear cataract, bilateral: Secondary | ICD-10-CM | POA: Diagnosis not present

## 2016-04-23 DIAGNOSIS — H40013 Open angle with borderline findings, low risk, bilateral: Secondary | ICD-10-CM | POA: Diagnosis not present

## 2016-04-23 DIAGNOSIS — H35373 Puckering of macula, bilateral: Secondary | ICD-10-CM | POA: Diagnosis not present

## 2016-04-24 DIAGNOSIS — Z0100 Encounter for examination of eyes and vision without abnormal findings: Secondary | ICD-10-CM | POA: Diagnosis not present

## 2016-04-28 DIAGNOSIS — E291 Testicular hypofunction: Secondary | ICD-10-CM | POA: Diagnosis not present

## 2016-04-30 DIAGNOSIS — H2513 Age-related nuclear cataract, bilateral: Secondary | ICD-10-CM | POA: Diagnosis not present

## 2016-04-30 DIAGNOSIS — H40051 Ocular hypertension, right eye: Secondary | ICD-10-CM | POA: Diagnosis not present

## 2016-04-30 DIAGNOSIS — H35373 Puckering of macula, bilateral: Secondary | ICD-10-CM | POA: Diagnosis not present

## 2016-05-06 DIAGNOSIS — H6123 Impacted cerumen, bilateral: Secondary | ICD-10-CM | POA: Diagnosis not present

## 2016-05-06 DIAGNOSIS — H903 Sensorineural hearing loss, bilateral: Secondary | ICD-10-CM | POA: Diagnosis not present

## 2016-05-28 DIAGNOSIS — H903 Sensorineural hearing loss, bilateral: Secondary | ICD-10-CM | POA: Diagnosis not present

## 2016-06-18 DIAGNOSIS — H40051 Ocular hypertension, right eye: Secondary | ICD-10-CM | POA: Diagnosis not present

## 2016-06-18 DIAGNOSIS — H2513 Age-related nuclear cataract, bilateral: Secondary | ICD-10-CM | POA: Diagnosis not present

## 2016-06-18 DIAGNOSIS — H35373 Puckering of macula, bilateral: Secondary | ICD-10-CM | POA: Diagnosis not present

## 2016-08-18 ENCOUNTER — Encounter: Payer: Self-pay | Admitting: Family Medicine

## 2016-08-21 NOTE — Telephone Encounter (Signed)
Please add these meds to pat med list and let pt know this was done. Thanks!

## 2016-08-24 ENCOUNTER — Encounter: Payer: Self-pay | Admitting: *Deleted

## 2016-08-24 ENCOUNTER — Encounter: Payer: Self-pay | Admitting: Family Medicine

## 2016-08-24 MED ORDER — TESTOSTERONE CYPIONATE 200 MG/ML IM SOLN: 100.0000 mg | INTRAMUSCULAR | 0 refills | Status: DC

## 2016-08-24 MED ORDER — TESTOSTERONE CYPIONATE 100 MG/ML IM SOLN: 50.0000 mg | INTRAMUSCULAR | 0 refills | Status: DC

## 2016-08-24 MED ORDER — LATANOPROST 0.005 % OP SOLN: 1.0000 [drp] | Freq: Every day | OPHTHALMIC | 12 refills | Status: DC

## 2016-08-31 ENCOUNTER — Ambulatory Visit (INDEPENDENT_AMBULATORY_CARE_PROVIDER_SITE_OTHER): Payer: Commercial Managed Care - HMO | Admitting: Family Medicine

## 2016-08-31 ENCOUNTER — Encounter: Payer: Self-pay | Admitting: Family Medicine

## 2016-08-31 VITALS — BP 124/80 | HR 79 | Temp 98.2°F | Ht 69.0 in | Wt 211.7 lb

## 2016-08-31 DIAGNOSIS — R109 Unspecified abdominal pain: Secondary | ICD-10-CM

## 2016-08-31 NOTE — Patient Instructions (Signed)
Make a follow up with your doctor in 2-4 weeks  Heat for 15 minutes twice daily and can use ibuprofen per instructions if needed.  Monitor for worsening, new symptoms or bulge that is painful and does not reduce - seek care immediately in interim if any other this or other concerns should occur.

## 2016-08-31 NOTE — Progress Notes (Signed)
HPI:  Acute visit for:  LLQ pain: -started yesterday -one specific spot in L lower abd wall, thought maybe felt a very small bulge here but not sure and concerned for hernia -now much better -no fevers, malaise, change in bowels, NVD, constipation, hematuria or dysuria -can't think of sig change in activities - some lifting and stretching with holiday decorating/removing  ROS: See pertinent positives and negatives per HPI.  Past Medical History:  Diagnosis Date  . Arthritis   . Asthma, mild    as child-only now if has URI  . BPH (benign prostatic hypertrophy)   . Eczema   . Glaucoma   . History of kidney stones   . Hypertension   . Reflux OCCASIONAL-  WATCHES DIET  . Renal calculi right    AND LEFT X1 NON-OBSTRUCTIVE  . Simple renal cyst right interpoler, simpler in nature , asymptomatic  per urologist note (dr dahlstedt)  12-26-2011   BENIGN  . Wears glasses   . Wears hearing aid     Past Surgical History:  Procedure Laterality Date  . bone spur removal     BILATERAL GREAT TOE  . CYSTO/ RIGHT RETROGRADE PYELOGRAM/ RIGHT URETER DILATATION/ RIGHT URETEROSCOPY/ LASER LITHOTRIPSY RIGHT RENAL PELVIS AND LOWER POLE CALCULI/ PLACEMENT STENT  01-11-2012  DR DAHLSTEDT   RIGHT RENAL PELVIS AND LOWER POLE CALCULI  . CYSTOSCOPY W/ RETROGRADES  06/02/2012   Procedure: CYSTOSCOPY WITH RETROGRADE PYELOGRAM;  Surgeon: Franchot Gallo, MD;  Location: [facility];  Service: Urology;  Laterality: Right;  . CYSTOSCOPY WITH URETEROSCOPY  06/02/2012   Procedure: CYSTOSCOPY WITH URETEROSCOPY;  Surgeon: Franchot Gallo, MD;  Location: [facility];  Service: Urology;  Laterality: Right;  90 MIN ALSO EXTRACTION OF CALCULI   . LITHOTRIPSY  APPROX.  1998   ESWL  left kidney x 2  . ROTATOR CUFF REPAIR  2012   LEFT SHOULDER  . SHOULDER ARTHROSCOPY WITH ROTATOR CUFF REPAIR AND SUBACROMIAL DECOMPRESSION  09/12/2012   Procedure: SHOULDER ARTHROSCOPY WITH ROTATOR  CUFF REPAIR AND SUBACROMIAL DECOMPRESSION;  Surgeon: Nita Sells, MD;  Location: [facility];  Service: Orthopedics;  Laterality: Right;  see above and biceps tendonotomy  . TRANSURETHRAL RESECTION OF PROSTATE  11-29-2009   BPH  . VASECTOMY  1970'S   GEN. ANES.    Family History  Problem Relation Age of Onset  . Cancer Mother     breast and kidney   . Squamous cell carcinoma Mother   . Cancer Father     lung  . Heart disease Father     Social History   Social History  . Marital status: Married    Spouse name: N/A  . Number of children: 3  . Years of education: N/A   Occupational History  . 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 Smoking status: Former Smoker    Years: 8.00    Types: Cigarettes    Quit date: 01/05/1967  . Smokeless tobacco: Never Used     Comment: QUIT AGE 57  . Alcohol use 0.0 oz/week     Comment: OCCASIONAL  . Drug use: No  . Sexual activity: Yes   Other Topics Concern  . None   Social History Narrative   regular exercise    Full code, (Reviewed 2014)     Current Outpatient Prescriptions:  .  albuterol (PROVENTIL HFA;VENTOLIN HFA) 108 (90 BASE) MCG/ACT inhaler, Inhale 2 puffs into the lungs every 6 (six)  hours as needed., Disp: 1 Inhaler, Rfl: 3 .  aspirin 81 MG tablet, Take 81 mg by mouth daily. , Disp: , Rfl:  .  gemfibrozil (LOPID) 600 MG tablet, Take 1 tablet (600 mg total) by mouth daily., Disp: 90 tablet, Rfl: 3 .  latanoprost (XALATAN) 0.005 % ophthalmic solution, Place 1 drop into the right eye at bedtime., Disp: 2.5 mL, Rfl: 12 .  lisinopril (PRINIVIL,ZESTRIL) 20 MG tablet, Take 1 tablet (20 mg total) by mouth daily., Disp: 90 tablet, Rfl: 3 .  psyllium (METAMUCIL) 58.6 % powder, Take 1 packet by mouth daily., Disp: , Rfl:  .  testosterone cypionate (DEPOTESTOSTERONE CYPIONATE) 200 MG/ML injection, Inject 0.5 mLs (100 mg total) into the muscle every 7 (seven) days., Disp: 10 mL,  Rfl: 0  EXAM:  Vitals:   08/31/16 1416  BP: 124/80  Pulse: 79  Temp: 98.2 F (36.8 C)    Body mass index is 31.26 kg/m.  GENERAL: vitals reviewed and listed above, alert, oriented, appears well hydrated and in no acute distress  HEENT: atraumatic, conjunttiva clear, no obvious abnormalities on inspection of external nose and ears  NECK: no obvious masses on inspection  ABD: BS+, dime sized area of tenderness to superficial palpation - no bulge appreciated here today even with valsalva lying and standing, no  Rebound to guarding or TTP elsewhere.  MS: moves all extremities without noticeable abnormality  PSYCH: pleasant and cooperative, no obvious depression or anxiety  ASSESSMENT AND PLAN:  Discussed the following assessment and plan:  Abdominal wall pain  -we had a rather lengthy discussion about possible serious and likely etiologies of abd pain and abd wall pain, workup and treatment, treatment risks and return precautions - suspect muscle strain vs small hernia not appreciable today -after this discussion, Carolyn opted for heat, OTC analgesic if needed, observation for now -follow up advised in 2-4 weeks -of course, we advised Tajae  to return or notify a doctor immediately if symptoms worsen or persist or new concerns arise. We also discussed emergency precuations/signs/symptoms of worsening or serious illness.   Patient Instructions  Make a follow up with your doctor in 2-4 weeks  Heat for 15 minutes twice daily and can use ibuprofen per instructions if needed.  Monitor for worsening, new symptoms or bulge that is painful and does not reduce - seek care immediately in interim if any other this or other concerns should occur.   Colin Benton R., DO

## 2016-08-31 NOTE — Progress Notes (Signed)
Pre visit review using our clinic review tool, if applicable. No additional management support is needed unless otherwise documented below in the visit note. 

## 2016-11-06 DIAGNOSIS — R3915 Urgency of urination: Secondary | ICD-10-CM | POA: Diagnosis not present

## 2016-11-06 DIAGNOSIS — N401 Enlarged prostate with lower urinary tract symptoms: Secondary | ICD-10-CM | POA: Diagnosis not present

## 2016-11-06 DIAGNOSIS — R3912 Poor urinary stream: Secondary | ICD-10-CM | POA: Diagnosis not present

## 2016-11-11 DIAGNOSIS — H2513 Age-related nuclear cataract, bilateral: Secondary | ICD-10-CM | POA: Diagnosis not present

## 2016-11-11 DIAGNOSIS — H40021 Open angle with borderline findings, high risk, right eye: Secondary | ICD-10-CM | POA: Diagnosis not present

## 2016-11-11 DIAGNOSIS — H35372 Puckering of macula, left eye: Secondary | ICD-10-CM | POA: Diagnosis not present

## 2016-11-11 DIAGNOSIS — H43812 Vitreous degeneration, left eye: Secondary | ICD-10-CM | POA: Diagnosis not present

## 2016-11-11 DIAGNOSIS — H35373 Puckering of macula, bilateral: Secondary | ICD-10-CM | POA: Diagnosis not present

## 2016-12-10 DIAGNOSIS — N401 Enlarged prostate with lower urinary tract symptoms: Secondary | ICD-10-CM | POA: Diagnosis not present

## 2016-12-10 DIAGNOSIS — R3915 Urgency of urination: Secondary | ICD-10-CM | POA: Diagnosis not present

## 2016-12-11 ENCOUNTER — Telehealth: Payer: Self-pay

## 2016-12-11 NOTE — Telephone Encounter (Signed)
Jack Norris notified as instructed by telephone.   

## 2016-12-11 NOTE — Telephone Encounter (Signed)
Left message for Jack Norris to return my call.

## 2016-12-11 NOTE — Telephone Encounter (Signed)
Okay to take if significant bothersome symptoms.. Follow BP for 1st 1-2 weeks on.. If not elevated BP.Marland Kitchen Continue Myrbetriq. Stop if BP elevated.

## 2016-12-11 NOTE — Telephone Encounter (Signed)
Pt left v/m; pt was seen by urologist and was given myrbetriq 25 mg daily for overactive bladder. Pt was advised could raise BP; pt is taking lisinopril and wants Dr Rometta Emery opinion if OK to take Myrbetriq. Last seen 04/16/16.

## 2017-01-11 DIAGNOSIS — Z872 Personal history of diseases of the skin and subcutaneous tissue: Secondary | ICD-10-CM | POA: Diagnosis not present

## 2017-01-11 DIAGNOSIS — B351 Tinea unguium: Secondary | ICD-10-CM | POA: Diagnosis not present

## 2017-01-11 DIAGNOSIS — L570 Actinic keratosis: Secondary | ICD-10-CM | POA: Diagnosis not present

## 2017-01-11 DIAGNOSIS — I781 Nevus, non-neoplastic: Secondary | ICD-10-CM | POA: Diagnosis not present

## 2017-01-11 DIAGNOSIS — Z86018 Personal history of other benign neoplasm: Secondary | ICD-10-CM | POA: Diagnosis not present

## 2017-01-11 DIAGNOSIS — Q800 Ichthyosis vulgaris: Secondary | ICD-10-CM | POA: Diagnosis not present

## 2017-01-20 DIAGNOSIS — N401 Enlarged prostate with lower urinary tract symptoms: Secondary | ICD-10-CM | POA: Diagnosis not present

## 2017-01-20 DIAGNOSIS — R351 Nocturia: Secondary | ICD-10-CM | POA: Diagnosis not present

## 2017-01-20 DIAGNOSIS — E291 Testicular hypofunction: Secondary | ICD-10-CM | POA: Diagnosis not present

## 2017-02-02 ENCOUNTER — Telehealth: Payer: Self-pay | Admitting: Family Medicine

## 2017-02-02 NOTE — Telephone Encounter (Signed)
PLEASE NOTE: All timestamps contained within this report are represented as [place] Standard Time. CONFIDENTIALTY NOTICE: This fax transmission is intended only for the addressee. It contains information that is legally privileged, confidential or otherwise protected from use or disclosure. If you are not the intended recipient, you are strictly prohibited from reviewing, disclosing, copying using or disseminating any of this information or taking any action in reliance on or regarding this information. If you have received this fax in error, please notify us immediately by telephone so that we can arrange for its return to [place]. Phone: [phone], Toll-Free: [phone], Fax: [phone] Page: 1 of 2 Call Id: [id number] [place] Patient Name: Jack Norris Gender: Male DOB: [birth year] Age: 76 Y 10 M 16 D Return Phone Number: [phone] (Primary), [phone] (Secondary) City/State/Zip: [place] [address] Client [facility] - Client Client Site [place] - Day Physician Eliezer Lofts - MD Who Is Calling Patient / Member / Family / Caregiver Call Type Triage / Clinical Relationship To Patient Self Return Phone Number [phone] (Primary) Chief Complaint Tick Bite Reason for Call Symptomatic / Request for [place] states he had two ticks, picked them out, one looks like it is healing. The other has a black dot under the skin, concerned he might not have gotten it all out. Wondering if he should try to dig it out, see his PCP, or go to the dermatologist Appointment Disposition EMR Appointment Not Necessary Info pasted into Epic Yes Nurse Assessment Nurse: Harlow Mares, RN, Suanne Marker Date/Time Eilene Ghazi Time): 02/02/2017 3:11:22 PM Confirm and document reason for call. If symptomatic, describe symptoms. ---Caller  states he had two ticks, picked them out (was about 1 week ago), one looks like it is healing. The other has a black dot under the skin, concerned he might not have gotten it all out. Wondering if he should try to dig it out, see his PCP, or go to the dermatologist. No symptoms. Does the PT have any chronic conditions? (i.e. diabetes, asthma, etc.) ---Yes List chronic conditions. ---asthma Guidelines Guideline Title Affirmed Question Tick Bite Tick bite with no complications Disp. Time Eilene Ghazi Time) Disposition Final User 02/02/2017 3:20:40 PM Home Care Yes Harlow Mares, RN, [facility] Advice Given Per Guideline HOME CARE: You should be able to treat this at home. REASSURANCE: Most tick bites are harmless and can be treated at home. The spread of disease by ticks is rare. WOOD TICK REMOVAL - BEST METHOD: * Use a tweezers and grasp the wood tick close to the skin (on its head). * Pull the wood tick straight upward without twisting or crushing it. * Maintain a steady pressure until it releases its grip. * If tweezers aren't available, use fingers, a loop of thread around the jaws, or a needle between the jaws for traction. * Note: covering the tick with petroleum jelly, nail polish or rubbing alcohol doesn't work. Neither does touching the tick with a hot or cold object. * Wash your hands. TINY DEER TICK REMOVAL: * Deer ticks are very small and need to be scraped off with a credit card edge or the edge of a knife blade. * Wash your hands. ANTIBIOTIC OINTMENT: Wash the wound and your hands with soap and water after removal to prevent catching any tick disease. Apply antibiotic ointment (OTC) to the bite once. CALL PLEASE NOTE: All timestamps contained within this report  are represented as [place] Standard Time. CONFIDENTIALTY NOTICE: This fax transmission is intended only for the addressee. It contains information that is legally privileged, confidential or otherwise protected from use or  disclosure. If you are not the intended recipient, you are strictly prohibited from reviewing, disclosing, copying using or disseminating any of this information or taking any action in reliance on or regarding this information. If you have received this fax in error, please notify us immediately by telephone so that we can arrange for its return to [place]. Phone: [phone], Toll-Free: [phone], Fax: [phone] Page: 2 of 2 Call Id: [id number] Care Advice Given Per Guideline BACK IF: * You can't remove the tick or the tick's head * Fever or rash occur in the next 2 weeks * Bite begins to look infected * You become worse. CARE ADVICE given per Tick Bites (Adult) guideline.

## 2017-02-02 NOTE — Telephone Encounter (Signed)
Jack Norris notified as instructed by telephone.   

## 2017-02-02 NOTE — Telephone Encounter (Signed)
[facility]  Patient Name: Jack Norris  DOB: [birth year]    Initial Comment Caller states he had two ticks, picked them out, one looks like it is healing. The other has a black dot under the skin, concerned he might not have gotten it all out. Wondering if he should try to dig it out, see his PCP, or go to the dermatologist    Nurse Assessment  Nurse: Harlow Mares, RN, Suanne Marker Date/Time Eilene Ghazi Time): 02/02/2017 3:11:22 PM  Confirm and document reason for call. If symptomatic, describe symptoms. ---Caller states he had two ticks, picked them out (was about 1 week ago), one looks like it is healing. The other has a black dot under the skin, concerned he might not have gotten it all out. Wondering if he should try to dig it out, see his PCP, or go to the dermatologist. No symptoms.  Does the patient have any new or worsening symptoms? ---Yes  Will a triage be completed? ---Yes  Related visit to physician within the last 2 weeks? ---N/A  Does the PT have any chronic conditions? (i.e. diabetes, asthma, etc.) ---Yes  List chronic conditions. ---asthma  Is this a behavioral health or substance abuse call? ---No     Guidelines    Guideline Title Affirmed Question Affirmed Notes  Tick Bite Tick bite with no complications    Final Disposition [place], RN, Suanne Marker    Disagree/Comply: Comply

## 2017-02-02 NOTE — Telephone Encounter (Signed)
Do not try to dig out mouthparts. Leave in place..  Your body will extrude over time. Apply antibiotic ointment. Make appt if redness around site spreading, or target rash or fever.

## 2017-02-06 DIAGNOSIS — Z0100 Encounter for examination of eyes and vision without abnormal findings: Secondary | ICD-10-CM | POA: Diagnosis not present

## 2017-02-07 DIAGNOSIS — H66001 Acute suppurative otitis media without spontaneous rupture of ear drum, right ear: Secondary | ICD-10-CM | POA: Diagnosis not present

## 2017-02-07 DIAGNOSIS — J209 Acute bronchitis, unspecified: Secondary | ICD-10-CM | POA: Diagnosis not present

## 2017-02-07 DIAGNOSIS — R05 Cough: Secondary | ICD-10-CM | POA: Diagnosis not present

## 2017-03-05 ENCOUNTER — Ambulatory Visit (INDEPENDENT_AMBULATORY_CARE_PROVIDER_SITE_OTHER): Payer: Medicare HMO | Admitting: Family Medicine

## 2017-03-05 ENCOUNTER — Encounter: Payer: Self-pay | Admitting: Family Medicine

## 2017-03-05 DIAGNOSIS — J9801 Acute bronchospasm: Secondary | ICD-10-CM | POA: Diagnosis not present

## 2017-03-05 MED ORDER — PREDNISONE 20 MG PO TABS: ORAL_TABLET | ORAL | 0 refills | Status: DC

## 2017-03-05 NOTE — Progress Notes (Signed)
   Subjective:    Patient ID: Jack Norris, male    DOB: 1941/07/16, 76 y.o.   MRN: [id number]  Cough  This is a new problem. The current episode started more than 1 month ago (in middle of June treated with azithromycin for ear infeciton and bronchitis.). The problem has been gradually improving. The cough is productive of sputum. Associated symptoms include myalgias and postnasal drip. Pertinent negatives include no chills, ear congestion, ear pain, fever, nasal congestion, sore throat, shortness of breath or wheezing. Associated symptoms comments: Chest tigtness  no sneeze, no itchy eyes. The symptoms are aggravated by lying down. Risk factors: remote former smoker. Treatments tried: allergra, azithromycin. The treatment provided mild relief. His past medical history is significant for asthma. There is no history of COPD.  Sore Throat   Associated symptoms include coughing. Pertinent negatives include no ear pain or shortness of breath.      Review of Systems  Constitutional: Negative for chills and fever.  HENT: Positive for postnasal drip. Negative for ear pain and sore throat.   Respiratory: Positive for cough. Negative for shortness of breath and wheezing.   Musculoskeletal: Positive for myalgias.       Objective:   Physical Exam  Constitutional: Vital signs are normal. He appears well-developed and well-nourished.  Non-toxic appearance. He does not appear ill. No distress.  HENT:  Head: Normocephalic and atraumatic.  Right Ear: Hearing, tympanic membrane, external ear and ear canal normal. No tenderness. No foreign bodies. Tympanic membrane is not retracted and not bulging.  Left Ear: Hearing, tympanic membrane, external ear and ear canal normal. No tenderness. No foreign bodies. Tympanic membrane is not retracted and not bulging.  Nose: Nose normal. No mucosal edema or rhinorrhea. Right sinus exhibits no maxillary sinus tenderness and no frontal sinus tenderness. Left sinus  exhibits no maxillary sinus tenderness and no frontal sinus tenderness.  Mouth/Throat: Uvula is midline, oropharynx is clear and moist and mucous membranes are normal. Normal dentition. No dental caries. No oropharyngeal exudate or tonsillar abscesses.  Eyes: Pupils are equal, round, and reactive to light. Conjunctivae, EOM and lids are normal. Lids are everted and swept, no foreign bodies found.  Neck: Trachea normal, normal range of motion and phonation normal. Neck supple. Carotid bruit is not present. No thyroid mass and no thyromegaly present.  Cardiovascular: Normal rate, regular rhythm, S1 normal, S2 normal, normal heart sounds, intact distal pulses and normal pulses.  Exam reveals no gallop.   No murmur heard. Pulmonary/Chest: Effort normal and breath sounds normal. No respiratory distress. He has no wheezes. He has no rhonchi. He has no rales.  Abdominal: Soft. Normal appearance and bowel sounds are normal. There is no hepatosplenomegaly. There is no tenderness. There is no rebound, no guarding and no CVA tenderness. No hernia.  Neurological: He is alert. He has normal reflexes.  Skin: Skin is warm, dry and intact. No rash noted.  Psychiatric: He has a normal mood and affect. His speech is normal and behavior is normal. Judgment normal.          Assessment & Plan:

## 2017-03-05 NOTE — Patient Instructions (Addendum)
   No sign of bacterial infection.  Most likely post infectious bronchospasm. Complete a course of prednisone.  Can use albuterol for coughing fits.  Call if new fever, or feeling ill. Jack Norris

## 2017-03-05 NOTE — Assessment & Plan Note (Signed)
No sign of bacterial infection. Treat with steroid taper and albuterol prn.

## 2017-04-08 ENCOUNTER — Telehealth: Payer: Self-pay | Admitting: Family Medicine

## 2017-04-08 DIAGNOSIS — E7800 Pure hypercholesterolemia, unspecified: Secondary | ICD-10-CM

## 2017-04-08 NOTE — Telephone Encounter (Signed)
-----   Message from Ellamae Sia sent at 04/07/2017 11:03 AM EDT ----- Regarding: Lab orders for Friday, 8.17.18 Patient is scheduled for CPX labs, please order future labs, Thanks , Karna Christmas

## 2017-04-09 ENCOUNTER — Other Ambulatory Visit (INDEPENDENT_AMBULATORY_CARE_PROVIDER_SITE_OTHER): Payer: Medicare HMO

## 2017-04-09 DIAGNOSIS — E7800 Pure hypercholesterolemia, unspecified: Secondary | ICD-10-CM | POA: Diagnosis not present

## 2017-04-09 LAB — COMPREHENSIVE METABOLIC PANEL
ALT: 12 U/L (ref 0–53)
AST: 14 U/L (ref 0–37)
Albumin: 4.1 g/dL (ref 3.5–5.2)
Alkaline Phosphatase: 63 U/L (ref 39–117)
BUN: 16 mg/dL (ref 6–23)
CO2: 28 mEq/L (ref 19–32)
Calcium: 9.2 mg/dL (ref 8.4–10.5)
Chloride: 107 mEq/L (ref 96–112)
Creatinine, Ser: 1.09 mg/dL (ref 0.40–1.50)
GFR: 69.9 mL/min (ref >60.00)
Glucose, Bld: 93 mg/dL (ref 70–99)
Potassium: 4.4 mEq/L (ref 3.5–5.1)
Sodium: 140 mEq/L (ref 135–145)
Total Bilirubin: 0.6 mg/dL (ref 0.2–1.2)
Total Protein: 6.5 g/dL (ref 6.0–8.3)

## 2017-04-09 LAB — LIPID PANEL
CHOLESTEROL: 165 mg/dL (ref 0–200)
HDL: 36.4 mg/dL — ABNORMAL LOW (ref >39.00)
LDL Cholesterol: 114 mg/dL — ABNORMAL HIGH (ref 0–99)
NonHDL: 128.34
TRIGLYCERIDES: 73 mg/dL (ref 0.0–149.0)
Total CHOL/HDL Ratio: 5
VLDL: 14.6 mg/dL (ref 0.0–40.0)

## 2017-04-12 ENCOUNTER — Telehealth: Payer: Self-pay | Admitting: Family Medicine

## 2017-04-12 NOTE — Telephone Encounter (Signed)
-----   Message from Marchia Bond sent at 04/01/2017  2:55 PM EDT ----- Regarding: Cpx labs Tues 8/21, need orders. Thanks :-) Please order  future cpx labs for pt's upcoming lab appt. Thanks Aniceto Boss

## 2017-04-13 ENCOUNTER — Other Ambulatory Visit: Payer: Medicare HMO

## 2017-04-20 ENCOUNTER — Encounter: Payer: Self-pay | Admitting: Family Medicine

## 2017-04-20 ENCOUNTER — Ambulatory Visit (INDEPENDENT_AMBULATORY_CARE_PROVIDER_SITE_OTHER): Payer: Medicare HMO | Admitting: Family Medicine

## 2017-04-20 ENCOUNTER — Ambulatory Visit (INDEPENDENT_AMBULATORY_CARE_PROVIDER_SITE_OTHER): Payer: Medicare HMO

## 2017-04-20 VITALS — BP 132/74 | HR 88 | Temp 98.8°F | Ht 69.25 in | Wt 209.5 lb

## 2017-04-20 DIAGNOSIS — Z6830 Body mass index (BMI) 30.0-30.9, adult: Secondary | ICD-10-CM

## 2017-04-20 DIAGNOSIS — E6609 Other obesity due to excess calories: Secondary | ICD-10-CM

## 2017-04-20 DIAGNOSIS — I10 Essential (primary) hypertension: Secondary | ICD-10-CM | POA: Diagnosis not present

## 2017-04-20 DIAGNOSIS — Z7189 Other specified counseling: Secondary | ICD-10-CM

## 2017-04-20 DIAGNOSIS — Z0000 Encounter for general adult medical examination without abnormal findings: Secondary | ICD-10-CM

## 2017-04-20 DIAGNOSIS — E7800 Pure hypercholesterolemia, unspecified: Secondary | ICD-10-CM | POA: Diagnosis not present

## 2017-04-20 DIAGNOSIS — K760 Fatty (change of) liver, not elsewhere classified: Secondary | ICD-10-CM

## 2017-04-20 MED ORDER — LISINOPRIL 20 MG PO TABS: 20.0000 mg | ORAL_TABLET | Freq: Every day | ORAL | 3 refills | Status: DC

## 2017-04-20 NOTE — Assessment & Plan Note (Signed)
Triglycerides well controlled on gemfibrozil. HDL too low.  AHA 10 year risk of CVD:  32%.. Statin indicated... Encouraged pt to change gemfibrozil to atrorvastatin.Jack Norris He will consider.  he also request referral to cardiologist for CVD risk counseling and consideration of stress testing ( he is asymptomatic.

## 2017-04-20 NOTE — Progress Notes (Signed)
Subjective:    Patient ID: Jack Norris, male    DOB: [birth year], 76 y.o.   MRN: [id number]  HPI  76 year old male presents for  complete physical and review of chronic health problems. He also has the following acute concerns today:  vertigo   He notes that he had an episode of vertigo when looking down leaning over.Marland Kitchen Room spinning resulting in emesis.  Slept rest of day.Marland Kitchen Resolved when he woke up.  Feels unsteady when leaning over and moving head.  No new neuro symptoms.. Weakness, numbness. No new hearing loss ( has  Hx), no new tinnitus ( has hx). Has hearing aides. Has follow up with Dr. Tami Ribas in next month ENT. IMproved with eply maneuver in past.   Had similar  Several years ago BPPV.   Following appt with me he will see Benita Stabile for his AMW. The note will be reviewed in detail when completed.  Hypertension:  Well controlled on  lisinopril Using medication without problems or lightheadedness:  See above Chest pain with exertion: none Edema: none Short of breath: none Average home BPs: 130/70s Other issues:   Wt Readings from Last 3 Encounters:  04/20/17 209 lb 8 oz (95 kg)  03/05/17 209 lb (94.8 kg)  08/31/16 211 lb 11.2 oz (96 kg)      Fatty liver disease: LFts wnl  Elevated Cholesterol: Hypertriglyceridemia resolved on gemfibrozil. Lab Results  Component Value Date   CHOL 165 04/09/2017   HDL 36.40 (L) 04/09/2017   LDLCALC 114 (H) 04/09/2017   LDLDIRECT 107.6 12/14/2011   TRIG 73.0 04/09/2017   CHOLHDL 5 04/09/2017  Using medications without problems: none Muscle aches: none Diet compliance: good Exercise: walking 3-4 days a week Other complaints:  No early. family history of CAD.  Social History /Family History/Past Medical History reviewed in detail and updated in EMR if needed. Blood pressure 132/74, pulse 88, temperature 98.8 F (37.1 C), temperature source Oral, height 5' 9.25" (1.759 m), weight 209 lb 8 oz (95 kg).  Review of  Systems  Constitutional: Negative for fatigue and fever.  HENT: Negative for ear pain.   Eyes: Negative for pain.  Respiratory: Negative for cough and shortness of breath.   Cardiovascular: Negative for chest pain, palpitations and leg swelling.  Gastrointestinal: Negative for abdominal pain.  Genitourinary: Negative for dysuria.  Musculoskeletal: Negative for arthralgias.  Neurological: Negative for syncope, light-headedness and headaches.  Psychiatric/Behavioral: Negative for dysphoric mood.       Objective:   Physical Exam  Constitutional: He is oriented to person, place, and time. Vital signs are normal. He appears well-developed and well-nourished.  Non-toxic appearance. He does not appear ill. No distress.  HENT:  Head: Normocephalic and atraumatic.  Right Ear: Hearing, tympanic membrane, external ear and ear canal normal.  Left Ear: Hearing, tympanic membrane, external ear and ear canal normal.  Nose: Nose normal.  Mouth/Throat: Uvula is midline, oropharynx is clear and moist and mucous membranes are normal.  Vertigo triggered by leaning forward.  Eyes: Pupils are equal, round, and reactive to light. Conjunctivae, EOM and lids are normal. Lids are everted and swept, no foreign bodies found.  Neck: Trachea normal, normal range of motion and phonation normal. Neck supple. Carotid bruit is not present. No thyroid mass and no thyromegaly present.  Cardiovascular: Normal rate, regular rhythm, S1 normal, S2 normal, intact distal pulses and normal pulses.  Exam reveals no gallop, no distant heart sounds and no friction rub.  No murmur heard. No peripheral edema  Pulmonary/Chest: Effort normal and breath sounds normal. No respiratory distress. He has no wheezes. He has no rhonchi. He has no rales.  Abdominal: Soft. Normal appearance and bowel sounds are normal. There is no hepatosplenomegaly. There is no tenderness. There is no rebound, no guarding and no CVA tenderness. No hernia.    Lymphadenopathy:    He has no cervical adenopathy.  Neurological: He is alert and oriented to person, place, and time. He has normal strength and normal reflexes. No cranial nerve deficit or sensory deficit. Gait normal.  Skin: Skin is warm, dry and intact. No rash noted.  Psychiatric: He has a normal mood and affect. His speech is normal and behavior is normal. Judgment and thought content normal.     Body mass index is 30.71 kg/m.      Assessment & Plan:  The patient's preventative maintenance and recommended screening tests for an annual wellness exam were reviewed in full today. Brought up to date unless services declined.  Counselled on the importance of diet, exercise, and its role in overall health and mortality. The patient's FH and SH was reviewed, including their home life, tobacco status, and drug and alcohol status.   Vaccines: Uptodate Colon: 05/2013, [facility] nml, no further indicated.  Prostate: per uro. PSA  Followed there Dr. Diona Fanti  Former smoker, remotely.

## 2017-04-20 NOTE — Assessment & Plan Note (Signed)
Counseled on weight loss. 

## 2017-04-20 NOTE — Patient Instructions (Signed)
Mr. Brogden , Thank you for taking time to come for your Medicare Wellness Visit. I appreciate your ongoing commitment to your health goals. Please review the following plan we discussed and let me know if I can assist you in the future.   These are the goals we discussed: Goals    . Increase physical activity          Starting 04/20/2017, I will continue to walk at least 60 min 2 days.        This is a list of the screening recommended for you and due dates:  Health Maintenance  Topic Date Due  . Flu Shot  11/21/2017*  . Tetanus Vaccine  08/01/2017  . Pneumonia vaccines  Completed  *Topic was postponed. The date shown is not the original due date.   Preventive Care for Adults  A healthy lifestyle and preventive care can promote health and wellness. Preventive health guidelines for adults include the following key practices.  . A routine yearly physical is a good way to check with your health care provider about your health and preventive screening. It is a chance to share any concerns and updates on your health and to receive a thorough exam.  . Visit your dentist for a routine exam and preventive care every 6 months. Brush your teeth twice a day and floss once a day. Good oral hygiene prevents tooth decay and gum disease.  . The frequency of eye exams is based on your age, health, family medical history, use  of contact lenses, and other factors. Follow your health care provider's ecommendations for frequency of eye exams.  . Eat a healthy diet. Foods like vegetables, fruits, whole grains, low-fat dairy products, and lean protein foods contain the nutrients you need without too many calories. Decrease your intake of foods high in solid fats, added sugars, and salt. Eat the right amount of calories for you. Get information about a proper diet from your health care provider, if necessary.  . Regular physical exercise is one of the most important things you can do for your health.  Most adults should get at least 150 minutes of moderate-intensity exercise (any activity that increases your heart rate and causes you to sweat) each week. In addition, most adults need muscle-strengthening exercises on 2 or more days a week.  Silver Sneakers may be a benefit available to you. To determine eligibility, you may visit the website: www.silversneakers.com or contact program at 4793904225 Mon-Fri between 8AM-8PM.   . Maintain a healthy weight. The body mass index (BMI) is a screening tool to identify possible weight problems. It provides an estimate of body fat based on height and weight. Your health care provider can find your BMI and can help you achieve or maintain a healthy weight.   For adults 20 years and older: ? A BMI below 18.5 is considered underweight. ? A BMI of 18.5 to 24.9 is normal. ? A BMI of 25 to 29.9 is considered overweight. ? A BMI of 30 and above is considered obese.   . Maintain normal blood lipids and cholesterol levels by exercising and minimizing your intake of saturated fat. Eat a balanced diet with plenty of fruit and vegetables. Blood tests for lipids and cholesterol should begin at age 64 and be repeated every 5 years. If your lipid or cholesterol levels are high, you are over 50, or you are at high risk for heart disease, you may need your cholesterol levels checked more frequently. Ongoing high  lipid and cholesterol levels should be treated with medicines if diet and exercise are not working.  . If you smoke, find out from your health care provider how to quit. If you do not use tobacco, please do not start.  . If you choose to drink alcohol, please do not consume more than 2 drinks per day. One drink is considered to be 12 ounces (355 mL) of beer, 5 ounces (148 mL) of wine, or 1.5 ounces (44 mL) of liquor.  . If you are 50-46 years old, ask your health care provider if you should take aspirin to prevent strokes.  . Use sunscreen. Apply sunscreen  liberally and repeatedly throughout the day. You should seek shade when your shadow is shorter than you. Protect yourself by wearing long sleeves, pants, a wide-brimmed hat, and sunglasses year round, whenever you are outdoors.  . Once a month, do a whole body skin exam, using a mirror to look at the skin on your back. Tell your health care provider of new moles, moles that have irregular borders, moles that are larger than a pencil eraser, or moles that have changed in shape or color.

## 2017-04-20 NOTE — Progress Notes (Signed)
I reviewed health advisor's note, was available for consultation, and agree with documentation and plan.  

## 2017-04-20 NOTE — Progress Notes (Signed)
Subjective:   Jack Norris is a 76 y.o. male who presents for Medicare Annual/Subsequent preventive examination.  Review of Systems:  N/A Cardiac Risk Factors include: advanced age (>51men, >61 women);male gender;obesity (BMI >30kg/m2);hypertension;dyslipidemia     Objective:    Vitals: BP 132/74   Pulse 88   Temp 98.8 F (37.1 C) (Oral)   Ht 5' 9.25" (1.759 m)   Wt 209 lb 8 oz (95 kg)   SpO2 95%   BMI 30.71 kg/m   Body mass index is 30.71 kg/m.  Tobacco History  Smoking Status  . Former Smoker  . Years: 8.00  . Types: Cigarettes  . Quit date: 01/05/1967  Smokeless Tobacco  . Never Used    Comment: QUIT AGE 31     Counseling given: No   Past Medical History:  Diagnosis Date  . Arthritis   . Asthma, mild    as child-only now if has URI  . BPH (benign prostatic hypertrophy)   . Eczema   . Glaucoma   . History of kidney stones   . Hypertension   . Reflux OCCASIONAL-  WATCHES DIET  . Renal calculi right    AND LEFT X1 NON-OBSTRUCTIVE  . Simple renal cyst right interpoler, simpler in nature , asymptomatic  per urologist note (dr dahlstedt)  12-26-2011   BENIGN  . Wears glasses   . Wears hearing aid    Past Surgical History:  Procedure Laterality Date  . bone spur removal     BILATERAL GREAT TOE  . CYSTO/ RIGHT RETROGRADE PYELOGRAM/ RIGHT URETER DILATATION/ RIGHT URETEROSCOPY/ LASER LITHOTRIPSY RIGHT RENAL PELVIS AND LOWER POLE CALCULI/ PLACEMENT STENT  01-11-2012  DR DAHLSTEDT   RIGHT RENAL PELVIS AND LOWER POLE CALCULI  . CYSTOSCOPY W/ RETROGRADES  06/02/2012   Procedure: CYSTOSCOPY WITH RETROGRADE PYELOGRAM;  Surgeon: Franchot Gallo, MD;  Location: [facility];  Service: Urology;  Laterality: Right;  . CYSTOSCOPY WITH URETEROSCOPY  06/02/2012   Procedure: CYSTOSCOPY WITH URETEROSCOPY;  Surgeon: Franchot Gallo, MD;  Location: [facility];  Service: Urology;  Laterality: Right;  90 MIN ALSO EXTRACTION OF CALCULI   . LITHOTRIPSY  APPROX.  1998   ESWL  left kidney x 2  . ROTATOR CUFF REPAIR  2012   LEFT SHOULDER  . SHOULDER ARTHROSCOPY WITH ROTATOR CUFF REPAIR AND SUBACROMIAL DECOMPRESSION  09/12/2012   Procedure: SHOULDER ARTHROSCOPY WITH ROTATOR CUFF REPAIR AND SUBACROMIAL DECOMPRESSION;  Surgeon: Nita Sells, MD;  Location: [facility];  Service: Orthopedics;  Laterality: Right;  see above and biceps tendonotomy  . TRANSURETHRAL RESECTION OF PROSTATE  11-29-2009   BPH  . VASECTOMY  1970'S   GEN. ANES.   Family History  Problem Relation Age of Onset  . Cancer Mother        breast and kidney   . Squamous cell carcinoma Mother   . Cancer Father        lung  . Heart disease Father    History  Sexual Activity  . Sexual activity: Yes    Outpatient Encounter Prescriptions as of 04/20/2017  Medication Sig  . albuterol (PROVENTIL HFA;VENTOLIN HFA) 108 (90 BASE) MCG/ACT inhaler Inhale 2 puffs into the lungs every 6 (six) hours as needed.  Marland Kitchen aspirin 81 MG tablet Take 81 mg by mouth daily.   Marland Kitchen gemfibrozil (LOPID) 600 MG tablet Take 1 tablet (600 mg total) by mouth daily.  Marland Kitchen latanoprost (XALATAN) 0.005 % ophthalmic solution Place 1 drop into the  right eye at bedtime.  Marland Kitchen lisinopril (PRINIVIL,ZESTRIL) 20 MG tablet Take 1 tablet (20 mg total) by mouth daily.  . psyllium (METAMUCIL) 58.6 % powder Take 1 packet by mouth daily.  Marland Kitchen testosterone cypionate (DEPOTESTOSTERONE CYPIONATE) 200 MG/ML injection Inject 0.5 mLs (100 mg total) into the muscle every 7 (seven) days.   No facility-administered encounter medications on file as of 04/20/2017.     Activities of Daily Living In your present state of health, do you have any difficulty performing the following activities: 04/20/2017  Hearing? Y  Vision? N  Difficulty concentrating or making decisions? Y  Walking or climbing stairs? N  Dressing or bathing? N  Doing errands, shopping? N  Preparing Food and eating ? N  Using the  Toilet? N  In the past six months, have you accidently leaked urine? Y  Do you have problems with loss of bowel control? N  Managing your Medications? N  Managing your Finances? N  Housekeeping or managing your Housekeeping? N  Some recent data might be hidden    Patient Care Team: Jinny Sanders, MD as PCP - General Clent Jacks, MD as Consulting Physician (Ophthalmology) Franchot Gallo, MD as Consulting Physician (Urology) Jannet Mantis, MD as Consulting Physician (Dermatology)   Assessment:    Hearing Screening Comments: Bilateral hearing aids Vision Screening Comments: Next vision exam in Oct 2018  Exercise Activities and Dietary recommendations Current Exercise Habits: Home exercise routine, Type of exercise: walking, Time (Minutes): 60, Frequency (Times/Week): 2, Weekly Exercise (Minutes/Week): 120, Intensity: Moderate, Exercise limited by: None identified  Goals    . Increase physical activity          Starting 04/20/2017, I will continue to walk at least 60 min 2 days.       Fall Risk Fall Risk  04/20/2017 04/16/2016 04/15/2015 02/06/2014 01/27/2013  Falls in the past year? No No No No No   Depression Screen PHQ 2/9 Scores 04/20/2017 04/16/2016 04/15/2015 02/06/2014  PHQ - 2 Score 0 0 0 0    Cognitive Function MMSE - Mini Mental State Exam 04/20/2017 04/16/2016  Orientation to time 5 5  Orientation to Place 5 5  Registration 3 3  Attention/ Calculation 0 0  Recall 3 3  Language- name 2 objects 0 0  Language- repeat 1 1  Language- follow 3 step command 3 3  Language- read & follow direction 0 0  Write a sentence 0 0  Copy design 0 0  Total score 20 20        Immunization History  Administered Date(s) Administered  . Influenza Split 06/26/2011, 07/11/2012  . Influenza Whole 05/24/2008, 06/24/2008, 07/04/2009, 05/24/2010  . Influenza,inj,Quad PF,6+ Mos 07/26/2013, 06/12/2014, 04/15/2015, 04/16/2016  . Pneumococcal Conjugate-13 04/15/2015  .  Pneumococcal Polysaccharide-23 09/14/2007  . Td 08/02/2007  . Zoster 04/08/2009   Screening Tests Health Maintenance  Topic Date Due  . INFLUENZA VACCINE  11/21/2017 (Originally 03/24/2017)  . TETANUS/TDAP  08/01/2017  . PNA vac Low Risk Adult  Completed      Plan:     I have personally reviewed and addressed the Medicare Annual Wellness questionnaire and have noted the following in the patient's chart:  A. Medical and social history B. Use of alcohol, tobacco or illicit drugs  C. Current medications and supplements D. Functional ability and status E.  Nutritional status F.  Physical activity G. Advance directives H. List of other physicians I.  Hospitalizations, surgeries, and ER visits in previous 12 months J.  Vitals K. Screenings to include hearing, vision, cognitive, depression L. Referrals and appointments - none  In addition, I have reviewed and discussed with patient certain preventive protocols, quality metrics, and best practice recommendations. A written personalized care plan for preventive services as well as general preventive health recommendations were provided to patient.  See attached scanned questionnaire for additional information.   Signed,   Lindell Noe, MHA, BS, LPN Health Coach

## 2017-04-20 NOTE — Assessment & Plan Note (Addendum)
Well controlled. Continue current medication. Encouraged exercise, weight loss, healthy eating habits.  

## 2017-04-20 NOTE — Assessment & Plan Note (Signed)
LFTs stable and fatty liver likely resolved with diet changes.

## 2017-04-20 NOTE — Progress Notes (Signed)
PCP notes:   Health maintenance:  Flu vaccine - addressed  Abnormal screenings:   None   Patient concerns:   None  Nurse concerns:  None  Next PCP appt:   N/A - CPE prior to AWV

## 2017-04-20 NOTE — Patient Instructions (Addendum)
Please stop at the front desk to set up referral. Call if interested in starting statin medication as recommended. Work on exercise heart healthy diet and weight loss.  Continue baby aspirin daily.  Start home desensitization exercises at home.. If not improving discuss with ENT.

## 2017-04-20 NOTE — Progress Notes (Signed)
Pre visit review using our clinic review tool, if applicable. No additional management support is needed unless otherwise documented below in the visit note. 

## 2017-04-30 DIAGNOSIS — H6123 Impacted cerumen, bilateral: Secondary | ICD-10-CM | POA: Diagnosis not present

## 2017-04-30 DIAGNOSIS — R42 Dizziness and giddiness: Secondary | ICD-10-CM | POA: Diagnosis not present

## 2017-04-30 DIAGNOSIS — H8111 Benign paroxysmal vertigo, right ear: Secondary | ICD-10-CM | POA: Diagnosis not present

## 2017-04-30 DIAGNOSIS — H903 Sensorineural hearing loss, bilateral: Secondary | ICD-10-CM | POA: Diagnosis not present

## 2017-05-31 NOTE — Progress Notes (Signed)
Cardiology Office Note   Date:  06/01/2017   ID:  NAOL Jack Norris, DOB 12-Nov-1940, MRN [id number]  PCP:  Jinny Sanders, MD    No chief complaint on file. hyperlipidemia   Wt Readings from Last 3 Encounters:  06/01/17 214 lb 9.6 oz (97.3 kg)  04/20/17 209 lb 8 oz (95 kg)  04/20/17 209 lb 8 oz (95 kg)       History of Present Illness: Jack Norris is a 76 y.o. male  With hyperlipidemia.  He has been on lopid for years.    He has had palpitations in the past.  No management with a cardiologist at that time.  Sx have resolved.  He has lost weigh tin the past.  Denies : Chest pain ( except for rare cramp in chest with breathing). Dizziness. Leg edema. Nitroglycerin use. Orthopnea. Palpitations. Paroxysmal nocturnal dyspnea. Shortness of breath. Syncope.   Not walking regularly.  He falls asleep.    His father had CAD, CABG x 4, chronic smoker.  Mother recently passed away from cancer at 95.       Past Medical History:  Diagnosis Date  . Arthritis   . Asthma, mild    as child-only now if has URI  . BPH (benign prostatic hypertrophy)   . Eczema   . Glaucoma   . History of kidney stones   . Hypertension   . Reflux OCCASIONAL-  WATCHES DIET  . Renal calculi right    AND LEFT X1 NON-OBSTRUCTIVE  . Simple renal cyst right interpoler, simpler in nature , asymptomatic  per urologist note (dr dahlstedt)  12-26-2011   BENIGN  . Wears glasses   . Wears hearing aid     Past Surgical History:  Procedure Laterality Date  . bone spur removal     BILATERAL GREAT TOE  . CYSTO/ RIGHT RETROGRADE PYELOGRAM/ RIGHT URETER DILATATION/ RIGHT URETEROSCOPY/ LASER LITHOTRIPSY RIGHT RENAL PELVIS AND LOWER POLE CALCULI/ PLACEMENT STENT  01-11-2012  DR DAHLSTEDT   RIGHT RENAL PELVIS AND LOWER POLE CALCULI  . CYSTOSCOPY W/ RETROGRADES  06/02/2012   Procedure: CYSTOSCOPY WITH RETROGRADE PYELOGRAM;  Surgeon: Franchot Gallo, MD;  Location: [facility];  Service:  Urology;  Laterality: Right;  . CYSTOSCOPY WITH URETEROSCOPY  06/02/2012   Procedure: CYSTOSCOPY WITH URETEROSCOPY;  Surgeon: Franchot Gallo, MD;  Location: [facility];  Service: Urology;  Laterality: Right;  90 MIN ALSO EXTRACTION OF CALCULI   . LITHOTRIPSY  APPROX.  1998   ESWL  left kidney x 2  . ROTATOR CUFF REPAIR  2012   LEFT SHOULDER  . SHOULDER ARTHROSCOPY WITH ROTATOR CUFF REPAIR AND SUBACROMIAL DECOMPRESSION  09/12/2012   Procedure: SHOULDER ARTHROSCOPY WITH ROTATOR CUFF REPAIR AND SUBACROMIAL DECOMPRESSION;  Surgeon: Nita Sells, MD;  Location: [facility];  Service: Orthopedics;  Laterality: Right;  see above and biceps tendonotomy  . TRANSURETHRAL RESECTION OF PROSTATE  11-29-2009   BPH  . VASECTOMY  1970'S   GEN. ANES.     Current Outpatient Prescriptions  Medication Sig Dispense Refill  . albuterol (PROVENTIL HFA;VENTOLIN HFA) 108 (90 BASE) MCG/ACT inhaler Inhale 2 puffs into the lungs every 6 (six) hours as needed. 1 Inhaler 3  . aspirin 81 MG tablet Take 81 mg by mouth daily.     Marland Kitchen gemfibrozil (LOPID) 600 MG tablet Take 1 tablet (600 mg total) by mouth daily. 90 tablet 3  . latanoprost (XALATAN) 0.005 % ophthalmic solution Place 1  drop into the right eye at bedtime. 2.5 mL 12  . lisinopril (PRINIVIL,ZESTRIL) 20 MG tablet Take 1 tablet (20 mg total) by mouth daily. 90 tablet 3  . meclizine (ANTIVERT) 25 MG tablet Take 25 mg by mouth as needed.  10  . psyllium (METAMUCIL) 58.6 % powder Take 1 packet by mouth daily.    Marland Kitchen testosterone cypionate (DEPOTESTOSTERONE CYPIONATE) 200 MG/ML injection Inject 0.5 mLs (100 mg total) into the muscle every 7 (seven) days. 10 mL 0   No current facility-administered medications for this visit.     Allergies:   Paroxetine; Ciprofloxacin; and Flagyl [metronidazole hcl]    Social History:  The patient  reports that he quit smoking about 50 years ago. His smoking use included Cigarettes. He  quit after 8.00 years of use. He has never used smokeless tobacco. He reports that he drinks alcohol. He reports that he does not use drugs.   Family History:  The patient's family history includes Cancer in his father and mother; Heart disease in his father; Squamous cell carcinoma in his mother.    ROS:  Please see the history of present illness.   Otherwise, review of systems are positive for weight gain.   All other systems are reviewed and negative.    PHYSICAL EXAM: VS:  BP (!) 144/80 (BP Location: Right Arm, Patient Position: Sitting, Cuff Size: Normal)   Pulse 74   Ht 5\' 9"  (1.753 m)   Wt 214 lb 9.6 oz (97.3 kg)   SpO2 97%   BMI 31.69 kg/m  , BMI Body mass index is 31.69 kg/m. GEN: Well nourished, well developed, in no acute distress  HEENT: normal  Neck: no JVD, carotid bruits, or masses Cardiac: RRR; no murmurs, rubs, or gallops,no edema  Respiratory:  clear to auscultation bilaterally, normal work of breathing GI: soft, nontender, nondistended, + BS, obese MS: no deformity or atrophy  Skin: warm and dry, no rash Neuro:  Strength and sensation are intact Psych: euthymic mood, full affect   EKG:   The ekg ordered today demonstrates NSR, PVC, no ST changes   Recent Labs: 04/09/2017: ALT 12; BUN 16; Creatinine, Ser 1.09; Potassium 4.4; Sodium 140   Lipid Panel    Component Value Date/Time   CHOL 165 04/09/2017 0822   TRIG 73.0 04/09/2017 0822   HDL 36.40 (L) 04/09/2017 0822   CHOLHDL 5 04/09/2017 0822   VLDL 14.6 04/09/2017 0822   LDLCALC 114 (H) 04/09/2017 0822   LDLDIRECT 107.6 12/14/2011 0954     Other studies Reviewed: Additional studies/ records that were reviewed today with results demonstrating: lipids reviewed.  TG well controlled. .   ASSESSMENT AND PLAN:  1. Some RF for CAD: Plan for ETT.  No active sx of CAD, but reasonable to screen given his family history and lipid issues.  2. Hyperlipidemia: TG controlled on lopid.  Encourage better diet and  increased exercise.  LDL may cone down with this. If statin is needed, would probably use pravastatin as this may be best tolerated with his lopid.   3. HTN: Continue lisinopril.  May improve with better lifestyle choices as noted below.     Current medicines are reviewed at length with the patient today.  The patient concerns regarding his medicines were addressed.  The following changes have been made:  No change  Labs/ tests ordered today include:   Orders Placed This Encounter  Procedures  . Exercise Tolerance Test  . EKG 12-Lead    Recommend  150 minutes/week of aerobic exercise Low fat, low carb, high fiber diet recommended  Disposition:   FU for treadmill   Signed, Larae Grooms, MD  06/01/2017 9:44 AM    [facility] [address], [place], [place]  [address] Phone: [phone]; Fax: [phone]

## 2017-06-01 ENCOUNTER — Encounter: Payer: Self-pay | Admitting: Interventional Cardiology

## 2017-06-01 ENCOUNTER — Ambulatory Visit (INDEPENDENT_AMBULATORY_CARE_PROVIDER_SITE_OTHER): Payer: Medicare HMO | Admitting: Interventional Cardiology

## 2017-06-01 VITALS — BP 144/80 | HR 74 | Ht 69.0 in | Wt 214.6 lb

## 2017-06-01 DIAGNOSIS — I10 Essential (primary) hypertension: Secondary | ICD-10-CM | POA: Diagnosis not present

## 2017-06-01 DIAGNOSIS — E7800 Pure hypercholesterolemia, unspecified: Secondary | ICD-10-CM | POA: Diagnosis not present

## 2017-06-01 DIAGNOSIS — Z8249 Family history of ischemic heart disease and other diseases of the circulatory system: Secondary | ICD-10-CM

## 2017-06-01 NOTE — Patient Instructions (Addendum)
Medication Instructions:  Your physician recommends that you continue on your current medications as directed. Please refer to the Current Medication list given to you today.   Labwork: None ordered  Testing/Procedures: Your physician has requested that you have an exercise tolerance test. For further information please visit [email or web address]. Please also follow instruction sheet, as given.   Follow-Up: Based on test results   Any Other Special Instructions Will Be Listed Below (If Applicable).   DASH Eating Plan DASH stands for "Dietary Approaches to Stop Hypertension." The DASH eating plan is a healthy eating plan that has been shown to reduce high blood pressure (hypertension). It may also reduce your risk for type 2 diabetes, heart disease, and stroke. The DASH eating plan may also help with weight loss. What are tips for following this plan? General guidelines  Avoid eating more than 2,300 mg (milligrams) of salt (sodium) a day. If you have hypertension, you may need to reduce your sodium intake to 1,500 mg a day.  Limit alcohol intake to no more than 1 drink a day for nonpregnant women and 2 drinks a day for men. One drink equals 12 oz of beer, 5 oz of wine, or 1 oz of hard liquor.  Work with your health care provider to maintain a healthy body weight or to lose weight. Ask what an ideal weight is for you.  Get at least 30 minutes of exercise that causes your heart to beat faster (aerobic exercise) most days of the week. Activities may include walking, swimming, or biking.  Work with your health care provider or diet and nutrition specialist (dietitian) to adjust your eating plan to your individual calorie needs. Reading food labels  Check food labels for the amount of sodium per serving. Choose foods with less than 5 percent of the Daily Value of sodium. Generally, foods with less than 300 mg of sodium per serving fit into this eating plan.  To find whole grains, look  for the word "whole" as the first word in the ingredient list. Shopping  Buy products labeled as "low-sodium" or "no salt added."  Buy fresh foods. Avoid canned foods and premade or frozen meals. Cooking  Avoid adding salt when cooking. Use salt-free seasonings or herbs instead of table salt or sea salt. Check with your health care provider or pharmacist before using salt substitutes.  Do not fry foods. Cook foods using healthy methods such as baking, boiling, grilling, and broiling instead.  Cook with heart-healthy oils, such as olive, canola, soybean, or sunflower oil. Meal planning   Eat a balanced diet that includes: ? 5 or more servings of fruits and vegetables each day. At each meal, try to fill half of your plate with fruits and vegetables. ? Up to 6-8 servings of whole grains each day. ? Less than 6 oz of lean meat, poultry, or fish each day. A 3-oz serving of meat is about the same size as a deck of cards. One egg equals 1 oz. ? 2 servings of low-fat dairy each day. ? A serving of nuts, seeds, or beans 5 times each week. ? Heart-healthy fats. Healthy fats called Omega-3 fatty acids are found in foods such as flaxseeds and coldwater fish, like sardines, salmon, and mackerel.  Limit how much you eat of the following: ? Canned or prepackaged foods. ? Food that is high in trans fat, such as fried foods. ? Food that is high in saturated fat, such as fatty meat. ? Sweets, desserts, sugary drinks, and  other foods with added sugar. ? Full-fat dairy products.  Do not salt foods before eating.  Try to eat at least 2 vegetarian meals each week.  Eat more home-cooked food and less restaurant, buffet, and fast food.  When eating at a restaurant, ask that your food be prepared with less salt or no salt, if possible. What foods are recommended? The items listed may not be a complete list. Talk with your dietitian about what dietary choices are best for you. Grains Whole-grain or  whole-wheat bread. Whole-grain or whole-wheat pasta. Brown rice. [place] Morrow. Bulgur. Whole-grain and low-sodium cereals. Pita bread. Low-fat, low-sodium crackers. Whole-wheat flour tortillas. Vegetables Fresh or frozen vegetables (raw, steamed, roasted, or grilled). Low-sodium or reduced-sodium tomato and vegetable juice. Low-sodium or reduced-sodium tomato sauce and tomato paste. Low-sodium or reduced-sodium canned vegetables. Fruits All fresh, dried, or frozen fruit. Canned fruit in natural juice (without added sugar). Meat and other protein foods Skinless chicken or [place]. Ground chicken or [place]. Pork with fat trimmed off. Fish and seafood. Egg whites. Dried beans, peas, or lentils. Unsalted nuts, nut butters, and seeds. Unsalted canned beans. Lean cuts of beef with fat trimmed off. Low-sodium, lean deli meat. Dairy Low-fat (1%) or fat-free (skim) milk. Fat-free, low-fat, or reduced-fat cheeses. Nonfat, low-sodium ricotta or cottage cheese. Low-fat or nonfat yogurt. Low-fat, low-sodium cheese. Fats and oils Soft margarine without trans fats. Vegetable oil. Low-fat, reduced-fat, or light mayonnaise and salad dressings (reduced-sodium). Canola, safflower, olive, soybean, and sunflower oils. Avocado. Seasoning and other foods Herbs. Spices. Seasoning mixes without salt. Unsalted popcorn and pretzels. Fat-free sweets. What foods are not recommended? The items listed may not be a complete list. Talk with your dietitian about what dietary choices are best for you. Grains Baked goods made with fat, such as croissants, muffins, or some breads. Dry pasta or rice meal packs. Vegetables Creamed or fried vegetables. Vegetables in a cheese sauce. Regular canned vegetables (not low-sodium or reduced-sodium). Regular canned tomato sauce and paste (not low-sodium or reduced-sodium). Regular tomato and vegetable juice (not low-sodium or reduced-sodium). Angie Fava. Olives. Fruits Canned fruit in a light  or heavy syrup. Fried fruit. Fruit in cream or butter sauce. Meat and other protein foods Fatty cuts of meat. Ribs. Fried meat. Berniece Salines. Sausage. Bologna and other processed lunch meats. Salami. Fatback. Hotdogs. Bratwurst. Salted nuts and seeds. Canned beans with added salt. Canned or smoked fish. Whole eggs or egg yolks. Chicken or [place] with skin. Dairy Whole or 2% milk, cream, and half-and-half. Whole or full-fat cream cheese. Whole-fat or sweetened yogurt. Full-fat cheese. Nondairy creamers. Whipped toppings. Processed cheese and cheese spreads. Fats and oils Butter. Stick margarine. Lard. Shortening. Ghee. Bacon fat. Tropical oils, such as coconut, palm kernel, or palm oil. Seasoning and other foods Salted popcorn and pretzels. Onion salt, garlic salt, seasoned salt, table salt, and sea salt. Worcestershire sauce. Tartar sauce. Barbecue sauce. Teriyaki sauce. Soy sauce, including reduced-sodium. Steak sauce. Canned and packaged gravies. Fish sauce. Oyster sauce. Cocktail sauce. Horseradish that you find on the shelf. Ketchup. Mustard. Meat flavorings and tenderizers. Bouillon cubes. Hot sauce and Tabasco sauce. Premade or packaged marinades. Premade or packaged taco seasonings. Relishes. Regular salad dressings. Where to find more information:  National Heart, Lung, and [place]: https://wilson-eaton.com/  American Heart Association: www.heart.org Summary  The DASH eating plan is a healthy eating plan that has been shown to reduce high blood pressure (hypertension). It may also reduce your risk for type 2 diabetes, heart disease, and stroke.  With the DASH eating plan, you should limit salt (sodium) intake to 2,300 mg a day. If you have hypertension, you may need to reduce your sodium intake to 1,500 mg a day.  When on the DASH eating plan, aim to eat more fresh fruits and vegetables, whole grains, lean proteins, low-fat dairy, and heart-healthy fats.  Work with your health care provider or  diet and nutrition specialist (dietitian) to adjust your eating plan to your individual calorie needs. This information is not intended to replace advice given to you by your health care provider. Make sure you discuss any questions you have with your health care provider. Document Released: 07/30/2011 Document Revised: 08/03/2016 Document Reviewed: 08/03/2016 Elsevier Interactive Patient Education  2017 [employer].    If you need a refill on your cardiac medications before your next appointment, please call your pharmacy.

## 2017-06-10 ENCOUNTER — Ambulatory Visit (INDEPENDENT_AMBULATORY_CARE_PROVIDER_SITE_OTHER): Payer: Medicare HMO

## 2017-06-10 DIAGNOSIS — E7800 Pure hypercholesterolemia, unspecified: Secondary | ICD-10-CM

## 2017-06-10 LAB — EXERCISE TOLERANCE TEST
CHL CUP MPHR: 144 {beats}/min
CHL CUP RESTING HR STRESS: 76 {beats}/min
CHL RATE OF PERCEIVED EXERTION: 16
CSEPEW: 7 METS
Exercise duration (min): 6 min
Exercise duration (sec): 0 s
Peak HR: 139 {beats}/min
Percent HR: 96 %

## 2017-06-21 ENCOUNTER — Encounter: Payer: Self-pay | Admitting: Family Medicine

## 2017-06-22 DIAGNOSIS — H2513 Age-related nuclear cataract, bilateral: Secondary | ICD-10-CM | POA: Diagnosis not present

## 2017-06-22 DIAGNOSIS — H40012 Open angle with borderline findings, low risk, left eye: Secondary | ICD-10-CM | POA: Diagnosis not present

## 2017-06-22 DIAGNOSIS — H40051 Ocular hypertension, right eye: Secondary | ICD-10-CM | POA: Diagnosis not present

## 2017-06-22 DIAGNOSIS — H43812 Vitreous degeneration, left eye: Secondary | ICD-10-CM | POA: Diagnosis not present

## 2017-06-22 DIAGNOSIS — H35372 Puckering of macula, left eye: Secondary | ICD-10-CM | POA: Diagnosis not present

## 2017-06-22 DIAGNOSIS — H43391 Other vitreous opacities, right eye: Secondary | ICD-10-CM | POA: Diagnosis not present

## 2017-06-28 ENCOUNTER — Other Ambulatory Visit: Payer: Self-pay | Admitting: Family Medicine

## 2017-07-01 ENCOUNTER — Ambulatory Visit (INDEPENDENT_AMBULATORY_CARE_PROVIDER_SITE_OTHER): Payer: Medicare HMO

## 2017-07-01 DIAGNOSIS — Z23 Encounter for immunization: Secondary | ICD-10-CM | POA: Diagnosis not present

## 2017-08-27 DIAGNOSIS — E291 Testicular hypofunction: Secondary | ICD-10-CM | POA: Diagnosis not present

## 2017-10-20 DIAGNOSIS — M5431 Sciatica, right side: Secondary | ICD-10-CM | POA: Diagnosis not present

## 2017-11-01 DIAGNOSIS — J385 Laryngeal spasm: Secondary | ICD-10-CM | POA: Diagnosis not present

## 2017-11-01 DIAGNOSIS — H903 Sensorineural hearing loss, bilateral: Secondary | ICD-10-CM | POA: Diagnosis not present

## 2017-11-01 DIAGNOSIS — H6123 Impacted cerumen, bilateral: Secondary | ICD-10-CM | POA: Diagnosis not present

## 2017-11-22 DIAGNOSIS — J101 Influenza due to other identified influenza virus with other respiratory manifestations: Secondary | ICD-10-CM

## 2017-11-22 HISTORY — DX: Influenza due to other identified influenza virus with other respiratory manifestations: J10.1

## 2017-11-23 ENCOUNTER — Ambulatory Visit (INDEPENDENT_AMBULATORY_CARE_PROVIDER_SITE_OTHER): Payer: Medicare HMO | Admitting: Family Medicine

## 2017-11-23 ENCOUNTER — Encounter: Payer: Self-pay | Admitting: Family Medicine

## 2017-11-23 ENCOUNTER — Other Ambulatory Visit: Payer: Self-pay

## 2017-11-23 VITALS — BP 138/64 | HR 96 | Temp 98.4°F | Ht 69.25 in | Wt 218.2 lb

## 2017-11-23 DIAGNOSIS — R509 Fever, unspecified: Secondary | ICD-10-CM | POA: Diagnosis not present

## 2017-11-23 DIAGNOSIS — J101 Influenza due to other identified influenza virus with other respiratory manifestations: Secondary | ICD-10-CM

## 2017-11-23 LAB — POC INFLUENZA A&B (BINAX/QUICKVUE)
INFLUENZA B, POC: NEGATIVE
Influenza A, POC: POSITIVE — AB

## 2017-11-23 MED ORDER — OSELTAMIVIR PHOSPHATE 75 MG PO CAPS
75.0000 mg | ORAL_CAPSULE | Freq: Two times a day (BID) | ORAL | 0 refills | Status: DC
Start: 2017-11-23 — End: 2018-01-13

## 2017-11-23 NOTE — Assessment & Plan Note (Signed)
Neg FLu test, but concerning for bacterial infection given  101 fever and persistence of symptoms.  Will treat with Z-pack and symptomatic care.

## 2017-11-23 NOTE — Patient Instructions (Addendum)
Rest, fluids.   Use mucinex DM as needed for cough.  Complete tamiflu.  Call if progressive shortness of breath or not improving as expected.  Contagious until 24 hour after fever resolved on no antipyretics.

## 2017-11-23 NOTE — Progress Notes (Signed)
   Subjective:    Patient ID: Jack Norris, male    DOB: Sep 26, 1940, 77 y.o.   MRN: [id number]  Cough  This is a new problem. The current episode started in the past 7 days. The problem has been gradually worsening. The cough is productive of sputum. Associated symptoms include a fever, headaches, nasal congestion, a sore throat and wheezing. Pertinent negatives include no ear pain or shortness of breath. Associated symptoms comments:  Fatigued Yesterday 101F  no sinus pain, eye achy. Risk factors: former moker. Treatments tried: mucinex. His past medical history is significant for asthma.  Fever   Associated symptoms include coughing, headaches, a sore throat and wheezing. Pertinent negatives include no ear pain.  Headache   Associated symptoms include coughing, eye pain, a fever and a sore throat. Pertinent negatives include no ear pain.  Eye Pain   Associated symptoms include a fever.    No known exposure to flu.  Uptodate with flu.  Blood pressure 138/64, pulse 96, temperature 98.4 F (36.9 C), temperature source Oral, height 5' 9.25" (1.759 m), weight 218 lb 4 oz (99 kg), SpO2 95 %.  Review of Systems  Constitutional: Positive for fever.  HENT: Positive for sore throat. Negative for ear pain.   Eyes: Positive for pain.  Respiratory: Positive for cough and wheezing. Negative for shortness of breath.   Neurological: Positive for headaches.       Objective:   Physical Exam  Constitutional: Vital signs are normal. He appears well-developed and well-nourished.  Non-toxic appearance. He does not appear ill. No distress.  HENT:  Head: Normocephalic and atraumatic.  Right Ear: Hearing, tympanic membrane, external ear and ear canal normal. No tenderness. No foreign bodies. Tympanic membrane is not retracted and not bulging.  Left Ear: Hearing, tympanic membrane, external ear and ear canal normal. No tenderness. No foreign bodies. Tympanic membrane is not retracted and not  bulging.  Nose: Nose normal. No mucosal edema or rhinorrhea. Right sinus exhibits no maxillary sinus tenderness and no frontal sinus tenderness. Left sinus exhibits no maxillary sinus tenderness and no frontal sinus tenderness.  Mouth/Throat: Uvula is midline, oropharynx is clear and moist and mucous membranes are normal. Normal dentition. No dental caries. No oropharyngeal exudate or tonsillar abscesses.  Eyes: Pupils are equal, round, and reactive to light. Conjunctivae, EOM and lids are normal. Lids are everted and swept, no foreign bodies found.  Neck: Trachea normal, normal range of motion and phonation normal. Neck supple. Carotid bruit is not present. No thyroid mass and no thyromegaly present.  Cardiovascular: Normal rate, regular rhythm, S1 normal, S2 normal, normal heart sounds, intact distal pulses and normal pulses. Exam reveals no gallop.  No murmur heard. Pulmonary/Chest: Effort normal. No respiratory distress. He has decreased breath sounds. He has no wheezes. He has no rhonchi. He has no rales.  Abdominal: Soft. Normal appearance and bowel sounds are normal. There is no hepatosplenomegaly. There is no tenderness. There is no rebound, no guarding and no CVA tenderness. No hernia.  Neurological: He is alert. He has normal reflexes.  Skin: Skin is warm, dry and intact. No rash noted.  Psychiatric: He has a normal mood and affect. His speech is normal and behavior is normal. Judgment normal.          Assessment & Plan:

## 2017-11-23 NOTE — Assessment & Plan Note (Signed)
Discussed symptomatic care.  Hydration, rest. Call if SOB, cough worsening or prolongued fever. Reviewed flu timeline. He has health problems that put him at risk for complications , so we decided to use of tamiflu. Pt agreed. Discussed family prophylaxis,  He was advised to not return to work until 24 hour after fever resolved on no antipyretics.

## 2017-12-06 ENCOUNTER — Other Ambulatory Visit: Payer: Self-pay | Admitting: *Deleted

## 2017-12-06 MED ORDER — ALBUTEROL SULFATE HFA 108 (90 BASE) MCG/ACT IN AERS: 2.0000 | INHALATION_SPRAY | Freq: Four times a day (QID) | RESPIRATORY_TRACT | 2 refills | Status: DC | PRN

## 2017-12-20 ENCOUNTER — Ambulatory Visit: Payer: Medicare HMO | Admitting: Interventional Cardiology

## 2018-01-13 ENCOUNTER — Encounter: Payer: Self-pay | Admitting: *Deleted

## 2018-01-13 ENCOUNTER — Other Ambulatory Visit: Payer: Self-pay | Admitting: *Deleted

## 2018-01-13 DIAGNOSIS — D1720 Benign lipomatous neoplasm of skin and subcutaneous tissue of unspecified limb: Secondary | ICD-10-CM | POA: Diagnosis not present

## 2018-01-13 DIAGNOSIS — Z86018 Personal history of other benign neoplasm: Secondary | ICD-10-CM | POA: Diagnosis not present

## 2018-01-13 DIAGNOSIS — Q801 X-linked ichthyosis: Secondary | ICD-10-CM | POA: Diagnosis not present

## 2018-01-13 DIAGNOSIS — L570 Actinic keratosis: Secondary | ICD-10-CM | POA: Diagnosis not present

## 2018-01-13 DIAGNOSIS — E7529 Other sphingolipidosis: Secondary | ICD-10-CM | POA: Diagnosis not present

## 2018-01-13 DIAGNOSIS — L578 Other skin changes due to chronic exposure to nonionizing radiation: Secondary | ICD-10-CM | POA: Diagnosis not present

## 2018-01-14 ENCOUNTER — Ambulatory Visit (INDEPENDENT_AMBULATORY_CARE_PROVIDER_SITE_OTHER)
Admission: RE | Admit: 2018-01-14 | Discharge: 2018-01-14 | Disposition: A | Discharge status: Home / Self Care | Payer: Medicare HMO | Source: Ambulatory Visit | Attending: Family Medicine | Admitting: Family Medicine

## 2018-01-14 ENCOUNTER — Encounter: Payer: Self-pay | Admitting: Family Medicine

## 2018-01-14 ENCOUNTER — Encounter: Payer: Self-pay | Admitting: *Deleted

## 2018-01-14 ENCOUNTER — Ambulatory Visit (INDEPENDENT_AMBULATORY_CARE_PROVIDER_SITE_OTHER): Payer: Medicare HMO | Admitting: Family Medicine

## 2018-01-14 ENCOUNTER — Other Ambulatory Visit: Payer: Self-pay

## 2018-01-14 DIAGNOSIS — M47816 Spondylosis without myelopathy or radiculopathy, lumbar region: Secondary | ICD-10-CM | POA: Diagnosis not present

## 2018-01-14 DIAGNOSIS — M5431 Sciatica, right side: Secondary | ICD-10-CM | POA: Insufficient documentation

## 2018-01-14 MED ORDER — DICLOFENAC SODIUM 75 MG PO TBEC: 75.0000 mg | DELAYED_RELEASE_TABLET | Freq: Two times a day (BID) | ORAL | 0 refills | Status: DC

## 2018-01-14 MED ORDER — PREDNISONE 20 MG PO TABS: ORAL_TABLET | ORAL | 0 refills | Status: DC

## 2018-01-14 NOTE — Patient Instructions (Signed)
We will call with X-ray results.  Complete course of prednisone. Following completion of prednisone could start a diclofenac as needed.  Start home PT, heat and massage.  If not improving in 2 weeks, follow up.

## 2018-01-14 NOTE — Assessment & Plan Note (Signed)
No red flags for emergent treatment.  Send for X-ray lumbar spine given age.  Most liekly Sciatica from OA and or herniated disc in spine.  Start home PT, heat massage, course of prednisone followed by diclofenac as needed.  if not improving consider formal PT referral and or MRI spine.

## 2018-01-14 NOTE — Progress Notes (Signed)
   Subjective:    Patient ID: Jack Norris, male    DOB: 1941-03-04, 77 y.o.   MRN: [id number]  HPI   77 year old male presents with low buttock pain. He reports right buttock on right , radiating to right upper thigh. No known proceeding injury or fall.  Burning pain.  Now in last 1-2 months.  Worse with sitting upright. Worse with driving long ways. They travel a lot and this is interfering.  Intermittent numbness in right foot.   He is worried because he is having the pain almost all the time he is sitting and now occ on left.  No weakness.  No loss of urine control (cystoscope showed small opening of bladder opening.. uro recommended out pt surgery, he is considering)  No perineal numbness.   He has tried heat, 600 mg ibuprofen ( helped minimmaly)  No past back surgeries.  Social History /Family History/Past Medical History reviewed in detail and updated in EMR if needed. Blood pressure 114/64, pulse 84, temperature 98.3 F (36.8 C), temperature source Oral, height 5' 9.25" (1.759 m), weight 213 lb 4 oz (96.7 kg).   Review of Systems  Constitutional: Negative for fatigue and fever.  HENT: Negative for ear pain.   Eyes: Negative for pain.  Respiratory: Negative for cough and shortness of breath.   Cardiovascular: Negative for chest pain, palpitations and leg swelling.  Gastrointestinal: Negative for abdominal pain.  Genitourinary: Negative for dysuria.  Musculoskeletal: Negative for arthralgias, back pain and gait problem.  Neurological: Negative for syncope, light-headedness and headaches.  Psychiatric/Behavioral: Negative for dysphoric mood.       Objective:   Physical Exam  Constitutional: Vital signs are normal. He appears well-developed and well-nourished.  HENT:  Head: Normocephalic.  Right Ear: Hearing normal.  Left Ear: Hearing normal.  Nose: Nose normal.  Mouth/Throat: Oropharynx is clear and moist and mucous membranes are normal.  Neck: Trachea  normal. Carotid bruit is not present. No thyroid mass and no thyromegaly present.  Cardiovascular: Normal rate, regular rhythm and normal pulses. Exam reveals no gallop, no distant heart sounds and no friction rub.  No murmur heard. No peripheral edema  Pulmonary/Chest: Effort normal and breath sounds normal. No respiratory distress.  Musculoskeletal:       Lumbar back: He exhibits normal range of motion, no tenderness and no bony tenderness.  ttp in right upper thigh an sciatic notch, positive SLR on right  neg faber's bilaterally, neg SLR on left  Skin: Skin is warm, dry and intact. No rash noted.  Psychiatric: He has a normal mood and affect. His speech is normal and behavior is normal. Thought content normal.          Assessment & Plan:

## 2018-01-26 ENCOUNTER — Other Ambulatory Visit: Payer: Self-pay | Admitting: Urology

## 2018-02-13 NOTE — Progress Notes (Signed)
Cardiology Office Note   Date:  02/14/2018   ID:  MOHD. DERFLINGER, DOB 1940-11-05, MRN [id number]  PCP:  Jinny Sanders, MD    No chief complaint on file.  FH of CAD  Wt Readings from Last 3 Encounters:  02/14/18 212 lb 12.8 oz (96.5 kg)  01/14/18 213 lb 4 oz (96.7 kg)  11/23/17 218 lb 4 oz (99 kg)       History of Present Illness: Jack Norris is a 77 y.o. male  With hyperlipidemia.  He has been on lopid for years.    He has had palpitations in the past.  No management with a cardiologist at that time.  Sx have resolved.  He has lost weight in the past.  His father had CAD, CABG x 4, chronic smoker.  Mother passed away from cancer at 95.     Stress test in 2018: Blood pressure demonstrated a hypertensive response to exercise.  There was no ST segment deviation noted during stress.   No ischemia. Very frequent monomorphic PVCs during peak stress and occasional PVCs in recovery. Normal exercise tolerance. Hypertensive response to exercise with max blood pressure 213/87 mmHg.  He had the flu in early 2019.   Denies : Chest pain. Dizziness. Leg edema. Nitroglycerin use. Orthopnea. Palpitations. Paroxysmal nocturnal dyspnea. Shortness of breath. Syncope.   He walks for activity.  He does not get 150 minutes of brisk walking.  He does a lot of slower.  He has some sciatica pain, when sitting for a long time.  Took prednisone.   He will be having transurethral bladder surgery in August 2019 for narrow neck.     Past Medical History:  Diagnosis Date  . Arthritis   . Asthma, mild    as child-only now if has URI  . BPH (benign prostatic hypertrophy)   . Eczema   . Glaucoma   . History of kidney stones   . Hypertension   . Reflux OCCASIONAL-  WATCHES DIET  . Renal calculi right    AND LEFT X1 NON-OBSTRUCTIVE  . Simple renal cyst right interpoler, simpler in nature , asymptomatic  per urologist note (dr dahlstedt)  12-26-2011   BENIGN  . Wears  glasses   . Wears hearing aid     Past Surgical History:  Procedure Laterality Date  . bone spur removal     BILATERAL GREAT TOE  . CYSTO/ RIGHT RETROGRADE PYELOGRAM/ RIGHT URETER DILATATION/ RIGHT URETEROSCOPY/ LASER LITHOTRIPSY RIGHT RENAL PELVIS AND LOWER POLE CALCULI/ PLACEMENT STENT  01-11-2012  DR DAHLSTEDT   RIGHT RENAL PELVIS AND LOWER POLE CALCULI  . CYSTOSCOPY W/ RETROGRADES  06/02/2012   Procedure: CYSTOSCOPY WITH RETROGRADE PYELOGRAM;  Surgeon: Franchot Gallo, MD;  Location: [facility];  Service: Urology;  Laterality: Right;  . CYSTOSCOPY WITH URETEROSCOPY  06/02/2012   Procedure: CYSTOSCOPY WITH URETEROSCOPY;  Surgeon: Franchot Gallo, MD;  Location: [facility];  Service: Urology;  Laterality: Right;  90 MIN ALSO EXTRACTION OF CALCULI   . LITHOTRIPSY  APPROX.  1998   ESWL  left kidney x 2  . ROTATOR CUFF REPAIR  2012   LEFT SHOULDER  . SHOULDER ARTHROSCOPY WITH ROTATOR CUFF REPAIR AND SUBACROMIAL DECOMPRESSION  09/12/2012   Procedure: SHOULDER ARTHROSCOPY WITH ROTATOR CUFF REPAIR AND SUBACROMIAL DECOMPRESSION;  Surgeon: Nita Sells, MD;  Location: [facility];  Service: Orthopedics;  Laterality: Right;  see above and biceps tendonotomy  . TRANSURETHRAL RESECTION OF PROSTATE  11-29-2009   BPH  . VASECTOMY  1970'S   GEN. ANES.     Current Outpatient Medications  Medication Sig Dispense Refill  . albuterol (PROAIR HFA) 108 (90 Base) MCG/ACT inhaler Inhale 2 puffs into the lungs every 6 (six) hours as needed for wheezing or shortness of breath. 18 g 2  . aspirin 81 MG tablet Take 81 mg by mouth daily.     . B-D 3CC LUER-LOK SYR 18GX1-1/2 18G X 1-1/2" 3 ML MISC USE ONCE A WEEK AS DIRECTED  2  . B-D 3CC LUER-LOK SYR 22GX1" 22G X 1" 3 ML MISC USE AS DIRECTED ONCE A WEEK  2  . diclofenac (VOLTAREN) 75 MG EC tablet Take 1 tablet (75 mg total) by mouth 2 (two) times daily. Start as needed once prednsione completed. 30  tablet 0  . gemfibrozil (LOPID) 600 MG tablet TAKE 1 TABLET (600 MG TOTAL) BY MOUTH DAILY. 90 tablet 3  . latanoprost (XALATAN) 0.005 % ophthalmic solution Place 1 drop into the right eye at bedtime. 2.5 mL 12  . lisinopril (PRINIVIL,ZESTRIL) 20 MG tablet Take 1 tablet (20 mg total) by mouth daily. 90 tablet 3  . meclizine (ANTIVERT) 25 MG tablet Take 25 mg by mouth as needed.  10  . psyllium (METAMUCIL) 58.6 % powder Take 1 packet by mouth daily.    Marland Kitchen testosterone cypionate (DEPOTESTOSTERONE CYPIONATE) 200 MG/ML injection Inject 0.5 mLs (100 mg total) into the muscle every 7 (seven) days. 10 mL 0  . predniSONE (DELTASONE) 20 MG tablet 3 tabs by mouth daily x 3 days, then 2 tabs by mouth daily x 2 days then 1 tab by mouth daily x 2 days (Patient not taking: Reported on 02/14/2018) 15 tablet 0   No current facility-administered medications for this visit.     Allergies:   Paroxetine; Ciprofloxacin; and Flagyl [metronidazole hcl]    Social History:  The patient  reports that he quit smoking about 51 years ago. His smoking use included cigarettes. He quit after 8.00 years of use. He has never used smokeless tobacco. He reports that he drinks alcohol. He reports that he does not use drugs.   Family History:  The patient's family history includes Cancer in his father and mother; Heart disease in his father; Squamous cell carcinoma in his mother.    ROS:  Please see the history of present illness.   Otherwise, review of systems are positive for butt and leg pain.   All other systems are reviewed and negative.    PHYSICAL EXAM: VS:  BP 120/70   Pulse 80   Ht 5\' 9"  (1.753 m)   Wt 212 lb 12.8 oz (96.5 kg)   SpO2 97%   BMI 31.43 kg/m  , BMI Body mass index is 31.43 kg/m. GEN: Well nourished, well developed, in no acute distress  HEENT: normal  Neck: no JVD, carotid bruits, or masses Cardiac: RRR; no murmurs, rubs, or gallops,no edema ; 2+ right DP pulse; 2+ left PT pulse Respiratory:  clear  to auscultation bilaterally, normal work of breathing GI: soft, nontender, nondistended, + BS MS: no deformity or atrophy  Skin: warm and dry, no rash Neuro:  Strength and sensation are intact Psych: euthymic mood, full affect   EKG:   The ekg ordered in 10/18 demonstrates NSR, PVC   Recent Labs: 04/09/2017: ALT 12; BUN 16; Creatinine, Ser 1.09; Potassium 4.4; Sodium 140   Lipid Panel    Component Value Date/Time   CHOL 165  04/09/2017 0822   TRIG 73.0 04/09/2017 0822   HDL 36.40 (L) 04/09/2017 0822   CHOLHDL 5 04/09/2017 0822   VLDL 14.6 04/09/2017 0822   LDLCALC 114 (H) 04/09/2017 0822   LDLDIRECT 107.6 12/14/2011 0954     Other studies Reviewed: Additional studies/ records that were reviewed today with results demonstrating: stress test as noted above.   ASSESSMENT AND PLAN:  1. RF for CAD:  FAmily h/o CAD.  Increase exercise to target below.  2. Hyperlipidemia: To be rechecked in August 2019.  COntrolled in 2018 as noted above. 3. HTN: The current medical regimen is effective;  continue present plan and medications.  COntinue to try to lose weight.   Current medicines are reviewed at length with the patient today.  The patient concerns regarding his medicines were addressed.  The following changes have been made:  No change  Labs/ tests ordered today include:  No orders of the defined types were placed in this encounter.   Recommend 150 minutes/week of aerobic exercise Low fat, low carb, high fiber diet recommended  Disposition:   FU in 1 year   Signed, Larae Grooms, MD  02/14/2018 8:13 AM    [facility] [place], [place], [place]  [address] Phone: [phone]; Fax: [phone]

## 2018-02-14 ENCOUNTER — Encounter: Payer: Self-pay | Admitting: Interventional Cardiology

## 2018-02-14 ENCOUNTER — Ambulatory Visit: Payer: Medicare HMO | Admitting: Interventional Cardiology

## 2018-02-14 VITALS — BP 120/70 | HR 80 | Ht 69.0 in | Wt 212.8 lb

## 2018-02-14 DIAGNOSIS — I10 Essential (primary) hypertension: Secondary | ICD-10-CM | POA: Diagnosis not present

## 2018-02-14 DIAGNOSIS — E7800 Pure hypercholesterolemia, unspecified: Secondary | ICD-10-CM | POA: Diagnosis not present

## 2018-02-14 DIAGNOSIS — Z8249 Family history of ischemic heart disease and other diseases of the circulatory system: Secondary | ICD-10-CM | POA: Diagnosis not present

## 2018-02-14 NOTE — Patient Instructions (Signed)

## 2018-03-22 DIAGNOSIS — N401 Enlarged prostate with lower urinary tract symptoms: Secondary | ICD-10-CM | POA: Diagnosis not present

## 2018-03-22 DIAGNOSIS — E291 Testicular hypofunction: Secondary | ICD-10-CM | POA: Diagnosis not present

## 2018-03-28 ENCOUNTER — Other Ambulatory Visit: Payer: Self-pay

## 2018-03-28 ENCOUNTER — Encounter (HOSPITAL_BASED_OUTPATIENT_CLINIC_OR_DEPARTMENT_OTHER): Payer: Self-pay

## 2018-03-28 NOTE — Progress Notes (Signed)
Spoke with:  Jameer NPO:  After Midnight, no gum, candy, or mints  Arrival time: 0930AM Labs:  Istat 8 (EKG 06/01/17 in epic) AM medications:  Gemfibrozil, Bring Asthma Inhaler day of surgery Pre op orders:  Yes Ride home:  Charlett Nose (wife) [phone]

## 2018-03-30 DIAGNOSIS — N401 Enlarged prostate with lower urinary tract symptoms: Secondary | ICD-10-CM | POA: Diagnosis not present

## 2018-03-30 DIAGNOSIS — R351 Nocturia: Secondary | ICD-10-CM | POA: Diagnosis not present

## 2018-03-30 DIAGNOSIS — E291 Testicular hypofunction: Secondary | ICD-10-CM | POA: Diagnosis not present

## 2018-04-08 ENCOUNTER — Encounter (HOSPITAL_BASED_OUTPATIENT_CLINIC_OR_DEPARTMENT_OTHER): Payer: Self-pay | Admitting: *Deleted

## 2018-04-08 ENCOUNTER — Encounter (HOSPITAL_BASED_OUTPATIENT_CLINIC_OR_DEPARTMENT_OTHER)
Admission: RE | Disposition: A | Discharge status: Home / Self Care | Payer: Self-pay | Source: Ambulatory Visit | Attending: Urology

## 2018-04-08 ENCOUNTER — Ambulatory Visit (HOSPITAL_BASED_OUTPATIENT_CLINIC_OR_DEPARTMENT_OTHER): Payer: Medicare HMO | Admitting: Certified Registered"

## 2018-04-08 ENCOUNTER — Ambulatory Visit (HOSPITAL_BASED_OUTPATIENT_CLINIC_OR_DEPARTMENT_OTHER)
Admission: RE | Admit: 2018-04-08 | Discharge: 2018-04-08 | Disposition: A | Discharge status: Home / Self Care | Payer: Medicare HMO | Source: Ambulatory Visit | Attending: Urology | Admitting: Urology

## 2018-04-08 ENCOUNTER — Other Ambulatory Visit: Payer: Self-pay

## 2018-04-08 DIAGNOSIS — N320 Bladder-neck obstruction: Secondary | ICD-10-CM | POA: Diagnosis not present

## 2018-04-08 DIAGNOSIS — E669 Obesity, unspecified: Secondary | ICD-10-CM | POA: Diagnosis not present

## 2018-04-08 DIAGNOSIS — Z6831 Body mass index (BMI) 31.0-31.9, adult: Secondary | ICD-10-CM | POA: Diagnosis not present

## 2018-04-08 DIAGNOSIS — J45909 Unspecified asthma, uncomplicated: Secondary | ICD-10-CM | POA: Diagnosis not present

## 2018-04-08 DIAGNOSIS — Z79899 Other long term (current) drug therapy: Secondary | ICD-10-CM | POA: Insufficient documentation

## 2018-04-08 DIAGNOSIS — Z888 Allergy status to other drugs, medicaments and biological substances status: Secondary | ICD-10-CM | POA: Insufficient documentation

## 2018-04-08 DIAGNOSIS — Z8249 Family history of ischemic heart disease and other diseases of the circulatory system: Secondary | ICD-10-CM | POA: Diagnosis not present

## 2018-04-08 DIAGNOSIS — Z87891 Personal history of nicotine dependence: Secondary | ICD-10-CM | POA: Insufficient documentation

## 2018-04-08 DIAGNOSIS — I10 Essential (primary) hypertension: Secondary | ICD-10-CM | POA: Diagnosis not present

## 2018-04-08 DIAGNOSIS — Z86010 Personal history of colonic polyps: Secondary | ICD-10-CM | POA: Diagnosis not present

## 2018-04-08 DIAGNOSIS — K219 Gastro-esophageal reflux disease without esophagitis: Secondary | ICD-10-CM | POA: Diagnosis not present

## 2018-04-08 DIAGNOSIS — E785 Hyperlipidemia, unspecified: Secondary | ICD-10-CM | POA: Diagnosis not present

## 2018-04-08 DIAGNOSIS — Z9852 Vasectomy status: Secondary | ICD-10-CM | POA: Insufficient documentation

## 2018-04-08 DIAGNOSIS — Z885 Allergy status to narcotic agent status: Secondary | ICD-10-CM | POA: Insufficient documentation

## 2018-04-08 DIAGNOSIS — Z881 Allergy status to other antibiotic agents status: Secondary | ICD-10-CM | POA: Diagnosis not present

## 2018-04-08 DIAGNOSIS — Z7982 Long term (current) use of aspirin: Secondary | ICD-10-CM | POA: Insufficient documentation

## 2018-04-08 DIAGNOSIS — E7800 Pure hypercholesterolemia, unspecified: Secondary | ICD-10-CM | POA: Diagnosis not present

## 2018-04-08 DIAGNOSIS — Z87442 Personal history of urinary calculi: Secondary | ICD-10-CM | POA: Insufficient documentation

## 2018-04-08 HISTORY — DX: Personal history of other diseases of the respiratory system: Z87.09

## 2018-04-08 HISTORY — DX: Obesity, unspecified: E66.9

## 2018-04-08 HISTORY — DX: Sciatica, right side: M54.31

## 2018-04-08 HISTORY — DX: Influenza due to other identified influenza virus with other respiratory manifestations: J10.1

## 2018-04-08 HISTORY — DX: Unspecified osteoarthritis, unspecified site: M19.90

## 2018-04-08 HISTORY — DX: Fatty (change of) liver, not elsewhere classified: K76.0

## 2018-04-08 HISTORY — DX: Hyperlipidemia, unspecified: E78.5

## 2018-04-08 HISTORY — DX: Epistaxis: R04.0

## 2018-04-08 HISTORY — DX: Personal history of colonic polyps: Z86.010

## 2018-04-08 HISTORY — PX: TRANSURETHRAL RESECTION OF BLADDER NECK: SHX6196

## 2018-04-08 HISTORY — DX: Ventricular premature depolarization: I49.3

## 2018-04-08 HISTORY — DX: Endocrine disorder, unspecified: E34.9

## 2018-04-08 HISTORY — DX: Personal history of other diseases of the digestive system: Z87.19

## 2018-04-08 HISTORY — DX: Benign paroxysmal vertigo, unspecified ear: H81.10

## 2018-04-08 HISTORY — DX: Personal history of colon polyps, unspecified: Z86.0100

## 2018-04-08 HISTORY — DX: Diverticulosis of intestine, part unspecified, without perforation or abscess without bleeding: K57.90

## 2018-04-08 LAB — POCT I-STAT, CHEM 8
BUN: 17 mg/dL (ref 8–23)
Calcium, Ion: 1.25 mmol/L (ref 1.15–1.40)
Chloride: 105 mmol/L (ref 98–111)
Creatinine, Ser: 1 mg/dL (ref 0.61–1.24)
Glucose, Bld: 91 mg/dL (ref 70–99)
HCT: 44 % (ref 39.0–52.0)
Hemoglobin: 15 g/dL (ref 13.0–17.0)
Potassium: 4.1 mmol/L (ref 3.5–5.1)
Sodium: 142 mmol/L (ref 135–145)
TCO2: 23 mmol/L (ref 22–32)

## 2018-04-08 SURGERY — RESECTION, BLADDER NECK, TRANSURETHRAL
Anesthesia: General | Site: Bladder

## 2018-04-08 MED ORDER — PROPOFOL 10 MG/ML IV BOLUS
INTRAVENOUS | Status: AC
  Filled 2018-04-08: qty 20

## 2018-04-08 MED ORDER — CEFAZOLIN SODIUM-DEXTROSE 2-4 GM/100ML-% IV SOLN
2.0000 g | INTRAVENOUS | Status: AC
  Administered 2018-04-08: 2 g via INTRAVENOUS
  Filled 2018-04-08: qty 100

## 2018-04-08 MED ORDER — ONDANSETRON HCL 4 MG/2ML IJ SOLN
INTRAMUSCULAR | Status: AC
  Filled 2018-04-08: qty 2

## 2018-04-08 MED ORDER — FENTANYL CITRATE (PF) 100 MCG/2ML IJ SOLN
INTRAMUSCULAR | Status: AC
  Filled 2018-04-08: qty 2

## 2018-04-08 MED ORDER — ONDANSETRON HCL 4 MG/2ML IJ SOLN
INTRAMUSCULAR | Status: DC | PRN
  Administered 2018-04-08: 4 mg via INTRAVENOUS

## 2018-04-08 MED ORDER — DEXAMETHASONE SODIUM PHOSPHATE 10 MG/ML IJ SOLN
INTRAMUSCULAR | Status: AC
  Filled 2018-04-08: qty 1

## 2018-04-08 MED ORDER — LIDOCAINE 2% (20 MG/ML) 5 ML SYRINGE
INTRAMUSCULAR | Status: AC
  Filled 2018-04-08: qty 5

## 2018-04-08 MED ORDER — LIDOCAINE 2% (20 MG/ML) 5 ML SYRINGE
INTRAMUSCULAR | Status: DC | PRN
  Administered 2018-04-08: 60 mg via INTRAVENOUS

## 2018-04-08 MED ORDER — PROPOFOL 10 MG/ML IV BOLUS
INTRAVENOUS | Status: DC | PRN
  Administered 2018-04-08: 200 mg via INTRAVENOUS

## 2018-04-08 MED ORDER — SODIUM CHLORIDE 0.9 % IR SOLN
Status: DC | PRN
  Administered 2018-04-08: 3000 mL via INTRAVESICAL

## 2018-04-08 MED ORDER — ONDANSETRON HCL 4 MG/2ML IJ SOLN
4.0000 mg | Freq: Once | INTRAMUSCULAR | Status: DC | PRN
  Filled 2018-04-08: qty 2

## 2018-04-08 MED ORDER — FENTANYL CITRATE (PF) 100 MCG/2ML IJ SOLN
INTRAMUSCULAR | Status: DC | PRN
  Administered 2018-04-08 (×2): 50 ug via INTRAVENOUS

## 2018-04-08 MED ORDER — CEFAZOLIN SODIUM-DEXTROSE 2-4 GM/100ML-% IV SOLN
INTRAVENOUS | Status: AC
  Filled 2018-04-08: qty 100

## 2018-04-08 MED ORDER — HYDROMORPHONE HCL 1 MG/ML IJ SOLN
0.2500 mg | INTRAMUSCULAR | Status: DC | PRN
  Filled 2018-04-08: qty 0.5

## 2018-04-08 MED ORDER — DEXAMETHASONE SODIUM PHOSPHATE 10 MG/ML IJ SOLN
INTRAMUSCULAR | Status: DC | PRN
  Administered 2018-04-08: 10 mg via INTRAVENOUS

## 2018-04-08 MED ORDER — LACTATED RINGERS IV SOLN
INTRAVENOUS | Status: DC
  Administered 2018-04-08: 10:00:00 via INTRAVENOUS
  Filled 2018-04-08: qty 1000

## 2018-04-08 MED ORDER — MEPERIDINE HCL 25 MG/ML IJ SOLN
6.2500 mg | INTRAMUSCULAR | Status: DC | PRN
  Filled 2018-04-08: qty 1

## 2018-04-08 SURGICAL SUPPLY — 31 items
BAG DRAIN URO-CYSTO SKYTR STRL (DRAIN) ×3 IMPLANT
BAG DRN ANRFLXCHMBR STRAP LEK (BAG)
BAG DRN UROCATH (DRAIN) ×1
BAG URINE DRAINAGE (UROLOGICAL SUPPLIES) ×2 IMPLANT
BAG URINE LEG 19OZ MD ST LTX (BAG) IMPLANT
CATH FOLEY 2WAY SLVR  5CC 18FR (CATHETERS) ×2
CATH FOLEY 2WAY SLVR  5CC 20FR (CATHETERS)
CATH FOLEY 2WAY SLVR  5CC 22FR (CATHETERS)
CATH FOLEY 2WAY SLVR 30CC 22FR (CATHETERS) IMPLANT
CATH FOLEY 2WAY SLVR 5CC 18FR (CATHETERS) IMPLANT
CATH FOLEY 2WAY SLVR 5CC 20FR (CATHETERS) IMPLANT
CATH FOLEY 2WAY SLVR 5CC 22FR (CATHETERS) IMPLANT
CATH FOLEY 3WAY 30CC 22F (CATHETERS) IMPLANT
CATH HEMA 3WAY 30CC 24FR COUDE (CATHETERS) IMPLANT
CATH HEMA 3WAY 30CC 24FR RND (CATHETERS) IMPLANT
CLOTH BEACON ORANGE TIMEOUT ST (SAFETY) ×3 IMPLANT
ELECT BIPOLAR KNIFE NDL PTD 7M (ELECTRODE) ×2 IMPLANT
EVACUATOR MICROVAS BLADDER (UROLOGICAL SUPPLIES) IMPLANT
GLOVE BIO SURGEON STRL SZ8 (GLOVE) ×3 IMPLANT
GOWN STRL REUS W/TWL XL LVL3 (GOWN DISPOSABLE) ×3 IMPLANT
HOLDER FOLEY CATH W/STRAP (MISCELLANEOUS) IMPLANT
KIT TURNOVER CYSTO (KITS) ×3 IMPLANT
LOOP CUT BIPOLAR 24F LRG (ELECTROSURGICAL) IMPLANT
MANIFOLD NEPTUNE II (INSTRUMENTS) ×2 IMPLANT
PACK CYSTO (CUSTOM PROCEDURE TRAY) ×3 IMPLANT
PLUG CATH AND CAP STER (CATHETERS) IMPLANT
SYR 30ML LL (SYRINGE) IMPLANT
SYRINGE IRR TOOMEY STRL 70CC (SYRINGE) IMPLANT
TUBE CONNECTING 12'X1/4 (SUCTIONS)
TUBE CONNECTING 12X1/4 (SUCTIONS) IMPLANT
TUBING UROLOGY SET (TUBING) ×2 IMPLANT

## 2018-04-08 NOTE — Anesthesia Preprocedure Evaluation (Signed)
Anesthesia Evaluation  Patient identified by MRN, date of birth, ID band Patient awake    Reviewed: Allergy & Precautions, NPO status , Patient's Chart, lab work & pertinent test results  Airway Mallampati: I  TM Distance: >3 FB Neck ROM: Full    Dental   Pulmonary former smoker,    Pulmonary exam normal        Cardiovascular hypertension, Pt. on medications Normal cardiovascular exam     Neuro/Psych    GI/Hepatic GERD  Medicated and Controlled,  Endo/Other    Renal/GU      Musculoskeletal   Abdominal   Peds  Hematology   Anesthesia Other Findings   Reproductive/Obstetrics                             Anesthesia Physical Anesthesia Plan  ASA: II  Anesthesia Plan: General   Post-op Pain Management:    Induction: Intravenous  PONV Risk Score and Plan: 2 and Ondansetron and Treatment may vary due to age or medical condition  Airway Management Planned: LMA  Additional Equipment:   Intra-op Plan:   Post-operative Plan: Extubation in OR  Informed Consent: I have reviewed the patients History and Physical, chart, labs and discussed the procedure including the risks, benefits and alternatives for the proposed anesthesia with the patient or authorized representative who has indicated his/her understanding and acceptance.     Plan Discussed with: CRNA and Surgeon  Anesthesia Plan Comments:         Anesthesia Quick Evaluation

## 2018-04-08 NOTE — Transfer of Care (Signed)
Immediate Anesthesia Transfer of Care Note  Patient: Jack Norris  Procedure(s) Performed: Procedure(s) (LRB): TRANSURETHRAL RESECTION OF BLADDER NECK CONTRACTURE (N/A)  Patient Location: PACU  Anesthesia Type: General  Level of Consciousness: awake, oriented, sedated and patient cooperative  Airway & Oxygen Therapy: Patient Spontanous Breathing and Patient connected to face mask oxygen  Post-op Assessment: Report given to PACU RN and Post -op Vital signs reviewed and stable  Post vital signs: Reviewed and stable  Complications: No apparent anesthesia complications Last Vitals:  Vitals Value Taken Time  BP    Temp    Pulse 70 04/08/2018 11:43 AM  Resp 10 04/08/2018 11:43 AM  SpO2 95 % 04/08/2018 11:43 AM  Vitals shown include unvalidated device data.  Last Pain:  Vitals:   04/08/18 0959  TempSrc:   PainSc: 0-No pain      Patients Stated Pain Goal: 5 (04/08/18 0959)

## 2018-04-08 NOTE — Op Note (Signed)
Preoperative diagnosis: Bladder neck contracture  Postoperative diagnosis: Same  Principal procedure: Incision of bladder neck contracture  Surgeon: Alanna Storti  Anesthesia: General with LMA  Complications: None  Specimen: None  Drains: 40 French Foley catheter to leg bag  Estimated blood loss: None  Indications: 77 year old male several years out from TURP.  He has decreasing urinary stream and obstructive symptoms.  He was recently found to have a bladder neck contracture.  He presents for TUR bladder neck contracture.  Description of procedure: The patient was properly identified in the holding area.  He received preoperative IV antibiotics.  Was taken to the operating room where general anesthetic was administered with the LMA.  He is placed in the dorsolithotomy position.  Genitalia and perineum were prepped and draped.  Proper timeout was performed.  16 French sheath was passed with the visual obturator.  Minimal bulbous urethral stricture.  There was a fairly tight bladder neck contracture, approximately 16-18 [place].  The sheath was passed up to this point.  I then passed the resectoscope with a Collins knife.  The bladder neck contracture was then incised at the 6 o'clock position from the lower trigone through the prostatic urethra to the verumontanum.  Incision was taken into the muscular fibers.  There was adequate opening up of the bladder neck contracture at this point.  Inspection of the bladder systematically revealed normal urothelium.  At this point, the scope was removed.  51 French Foley catheter was then placed, balloon filled with 10 cc of water and this was hooked to dependent drainage.  The patient tolerated the procedure well.  He was then awakened and taken to the PACU in stable condition.

## 2018-04-08 NOTE — Anesthesia Postprocedure Evaluation (Signed)
Anesthesia Post Note  Patient: Jack Norris  Procedure(s) Performed: TRANSURETHRAL RESECTION OF BLADDER NECK CONTRACTURE (N/A Bladder)     Patient location during evaluation: PACU Anesthesia Type: General Level of consciousness: awake and alert Pain management: pain level controlled Vital Signs Assessment: post-procedure vital signs reviewed and stable Respiratory status: spontaneous breathing, nonlabored ventilation, respiratory function stable and patient connected to nasal cannula oxygen Cardiovascular status: blood pressure returned to baseline and stable Postop Assessment: no apparent nausea or vomiting Anesthetic complications: no    Last Vitals:  Vitals:   04/08/18 1142 04/08/18 1310  BP: 117/62 126/68  Pulse: 70 65  Resp: 12 14  Temp: 36.4 C 36.7 C  SpO2: 95% 95%    Last Pain:  Vitals:   04/08/18 1310  TempSrc: Oral  PainSc: 2                  Hayk Divis DAVID

## 2018-04-08 NOTE — Discharge Instructions (Signed)
°  Post Anesthesia Home Care Instructions  Activity: Get plenty of rest for the remainder of the day. A responsible individual must stay with you for 24 hours following the procedure.  For the next 24 hours, DO NOT: -Drive a car -Paediatric nurse -Drink alcoholic beverages -Take any medication unless instructed by your physician -Make any legal decisions or sign important papers.  Meals: Start with liquid foods such as gelatin or soup. Progress to regular foods as tolerated. Avoid greasy, spicy, heavy foods. If nausea and/or vomiting occur, drink only clear liquids until the nausea and/or vomiting subsides. Call your physician if vomiting continues.  Special Instructions/Symptoms: Your throat may feel dry or sore from the anesthesia or the breathing tube placed in your throat during surgery. If this causes discomfort, gargle with warm salt water. The discomfort should disappear within 24 hours.  If you had a scopolamine patch placed behind your ear for the management of post- operative nausea and/or vomiting:  1. The medication in the patch is effective for 72 hours, after which it should be removed.  Wrap patch in a tissue and discard in the trash. Wash hands thoroughly with soap and water. 2. You may remove the patch earlier than 72 hours if you experience unpleasant side effects which may include dry mouth, dizziness or visual disturbances. 3. Avoid touching the patch. Wash your hands with soap and water after contact with the patch.   1. You may see some blood in the urine and may have some burning with urination for 48-72 hours. You also may notice that you have to urinate more frequently or urgently after your procedure which is normal.  2. You should call should you develop an inability urinate, fever > 101, persistent nausea and vomiting that prevents you from eating or drinking to stay hydrated.  3. If you have a catheter, you will be taught how to take care of the catheter by the  nursing staff prior to discharge from the hospital.  You may periodically feel a strong urge to void with the catheter in place.  This is a bladder spasm and most often can occur when having a bowel movement or moving around. It is typically self-limited and usually will stop after a few minutes.  You may use some Vaseline or Neosporin around the tip of the catheter to reduce friction at the tip of the penis. You may also see some blood in the urine.  A very small amount of blood can make the urine look quite red.  As long as the catheter is draining well, there usually is not a problem.  However, if the catheter is not draining well and is bloody, you should call the office [phone]) to notify us.  It is okay to remove the catheter on Saturday morning

## 2018-04-08 NOTE — Anesthesia Procedure Notes (Signed)
Procedure Name: LMA Insertion Date/Time: 04/08/2018 11:23 AM Performed by: Suan Halter, CRNA Pre-anesthesia Checklist: Patient identified, Emergency Drugs available, Suction available and Patient being monitored Patient Re-evaluated:Patient Re-evaluated prior to induction Oxygen Delivery Method: Circle system utilized Preoxygenation: Pre-oxygenation with 100% oxygen Induction Type: IV induction Ventilation: Mask ventilation without difficulty LMA: LMA inserted LMA Size: 4.0 Number of attempts: 1 Airway Equipment and Method: Bite block Placement Confirmation: positive ETCO2 Tube secured with: Tape Dental Injury: Teeth and Oropharynx as per pre-operative assessment

## 2018-04-08 NOTE — H&P (Signed)
H&P  Chief Complaint: Difficulty urinating  History of Present Illness: 77 year old male status post TURP several years ago.  He has gradually decreasing urinary stream.  Recent cystoscopy revealed tight bladder neck contracture.  He presents at this time for TUR bladder neck neck contracture.  Past Medical History:  Diagnosis Date  . Asthma, mild    as child-only now if has URI  . BPH (benign prostatic hypertrophy)   . BPPV (benign paroxysmal positional vertigo)   . Diverticulosis   . Eczema   . Fatty liver disease, nonalcoholic   . Frequent nosebleeds    History of  . GERD (gastroesophageal reflux disease)    occ uses TUMS as needed  . Glaucoma   . History of bronchitis   . History of colon polyps   . History of diverticulitis   . History of kidney stones   . Hyperlipemia   . Hypertension   . Influenza A 11/2017  . OA (osteoarthritis)   . Obesity   . PVCs (premature ventricular contractions)   . Renal calculi right    AND LEFT X1 NON-OBSTRUCTIVE  . Right sided sciatica    Bilateral buttock and thighs  . Simple renal cyst right interpoler, simpler in nature , asymptomatic  per urologist note (dr Carmisha Larusso)  12-26-2011   BENIGN, has been drained  . Testosterone deficiency   . Wears glasses   . Wears hearing aid     Past Surgical History:  Procedure Laterality Date  . APPENDECTOMY    . bone spur removal     BILATERAL GREAT TOE  . COLONOSCOPY    . CYSTO/ RIGHT RETROGRADE PYELOGRAM/ RIGHT URETER DILATATION/ RIGHT URETEROSCOPY/ LASER LITHOTRIPSY RIGHT RENAL PELVIS AND LOWER POLE CALCULI/ PLACEMENT STENT  01-11-2012  DR Doreather Hoxworth   RIGHT RENAL PELVIS AND LOWER POLE CALCULI  . CYSTOSCOPY W/ RETROGRADES  06/02/2012   Procedure: CYSTOSCOPY WITH RETROGRADE PYELOGRAM;  Surgeon: Franchot Gallo, MD;  Location: [facility];  Service: Urology;  Laterality: Right;  . CYSTOSCOPY WITH URETEROSCOPY  06/02/2012   Procedure: CYSTOSCOPY WITH URETEROSCOPY;  Surgeon:  Franchot Gallo, MD;  Location: [facility];  Service: Urology;  Laterality: Right;  90 MIN ALSO EXTRACTION OF CALCULI   . LITHOTRIPSY  APPROX.  1998   ESWL  left kidney x 2  . ROTATOR CUFF REPAIR  2012   LEFT SHOULDER  . SHOULDER ARTHROSCOPY WITH ROTATOR CUFF REPAIR AND SUBACROMIAL DECOMPRESSION  09/12/2012   Procedure: SHOULDER ARTHROSCOPY WITH ROTATOR CUFF REPAIR AND SUBACROMIAL DECOMPRESSION;  Surgeon: Nita Sells, MD;  Location: [facility];  Service: Orthopedics;  Laterality: Right;  see above and biceps tendonotomy  . TRANSURETHRAL RESECTION OF PROSTATE  11-29-2009   BPH  . VASECTOMY  1970'S   GEN. ANES.    Home Medications:    Allergies:  Allergies  Allergen Reactions  . Morphine And Related     Causes bladder problems  . Paroxetine     REACTION: Had mild personality change on this medication and doesn't want to take this again.  . Ciprofloxacin Other (See Comments)    ABDOMINAL PAIN/ sob  . Flagyl [Metronidazole Hcl] Other (See Comments)    ABDOMINAL PAIN/ sob    Family History  Problem Relation Age of Onset  . Cancer Mother        breast and kidney   . Squamous cell carcinoma Mother   . Cancer Father        lung  . Heart disease  Father     Social History:  reports that he quit smoking about 51 years ago. His smoking use included cigarettes. He quit after 8.00 years of use. He has never used smokeless tobacco. He reports that he drinks alcohol. He reports that he does not use drugs.  ROS: A complete review of systems was performed.  All systems are negative except for pertinent findings as noted.  Physical Exam:  Vital signs in last 24 hours: Temp:  [98.5 F (36.9 C)] 98.5 F (36.9 C) (08/16 0926) Pulse Rate:  [73] 73 (08/16 0926) Resp:  [16] 16 (08/16 0926) BP: (141)/(80) 141/80 (08/16 0926) SpO2:  [98 %] 98 % (08/16 0926) Weight:  [95.5 kg] 95.5 kg (08/16 0926) Constitutional:  Alert and oriented, No acute  distress Cardiovascular: Regular rate  Respiratory: Normal respiratory effort GI: Abdomen is soft, nontender, nondistended, no abdominal masses Genitourinary: No CVAT. Normal male phallus, testes are descended bilaterally and non-tender and without masses, scrotum is normal in appearance without lesions or masses, perineum is normal on inspection. Lymphatic: No lymphadenopathy Neurologic: Grossly intact, no focal deficits Psychiatric: Normal mood and affect  Laboratory Data:  Recent Labs    04/08/18 1023  HGB 15.0  HCT 44.0    Recent Labs    04/08/18 1023  NA 142  K 4.1  CL 105  GLUCOSE 91  BUN 17  CREATININE 1.00     Results for orders placed or performed during the hospital encounter of 04/08/18 (from the past 24 hour(s))  I-STAT, chem 8     Status: None   Collection Time: 04/08/18 10:23 AM  Result Value Ref Range   Sodium 142 135 - 145 mmol/L   Potassium 4.1 3.5 - 5.1 mmol/L   Chloride 105 98 - 111 mmol/L   BUN 17 8 - 23 mg/dL   Creatinine, Ser 1.00 0.61 - 1.24 mg/dL   Glucose, Bld 91 70 - 99 mg/dL   Calcium, Ion 1.25 1.15 - 1.40 mmol/L   TCO2 23 22 - 32 mmol/L   Hemoglobin 15.0 13.0 - 17.0 g/dL   HCT 44.0 39.0 - 52.0 %   No results found for this or any previous visit (from the past 240 hour(s)).  Renal Function: Recent Labs    04/08/18 1023  CREATININE 1.00   Estimated Creatinine Clearance: 70.5 mL/min (by C-G formula based on SCr of 1 mg/dL).  Radiologic Imaging: No results found.  Impression/Assessment:  Bladder neck contracture  Plan:  Incision of bladder neck contracture

## 2018-04-11 ENCOUNTER — Encounter (HOSPITAL_BASED_OUTPATIENT_CLINIC_OR_DEPARTMENT_OTHER): Payer: Self-pay | Admitting: Urology

## 2018-04-13 ENCOUNTER — Other Ambulatory Visit: Payer: Self-pay | Admitting: Family Medicine

## 2018-04-14 ENCOUNTER — Telehealth: Payer: Self-pay | Admitting: Family Medicine

## 2018-04-14 ENCOUNTER — Other Ambulatory Visit (INDEPENDENT_AMBULATORY_CARE_PROVIDER_SITE_OTHER): Payer: Medicare HMO

## 2018-04-14 DIAGNOSIS — E7800 Pure hypercholesterolemia, unspecified: Secondary | ICD-10-CM

## 2018-04-14 LAB — LIPID PANEL
CHOL/HDL RATIO: 5
Cholesterol: 159 mg/dL (ref 0–200)
HDL: 35.2 mg/dL — AB (ref >39.00)
LDL CALC: 105 mg/dL — AB (ref 0–99)
NONHDL: 123.97
Triglycerides: 94 mg/dL (ref 0.0–149.0)
VLDL: 18.8 mg/dL (ref 0.0–40.0)

## 2018-04-14 LAB — COMPREHENSIVE METABOLIC PANEL
ALT: 10 U/L (ref 0–53)
AST: 10 U/L (ref 0–37)
Albumin: 4.4 g/dL (ref 3.5–5.2)
Alkaline Phosphatase: 78 U/L (ref 39–117)
BUN: 23 mg/dL (ref 6–23)
CHLORIDE: 105 meq/L (ref 96–112)
CO2: 29 meq/L (ref 19–32)
Calcium: 9.7 mg/dL (ref 8.4–10.5)
Creatinine, Ser: 1.26 mg/dL (ref 0.40–1.50)
GFR: 58.97 mL/min — ABNORMAL LOW (ref >60.00)
GLUCOSE: 94 mg/dL (ref 70–99)
POTASSIUM: 4.4 meq/L (ref 3.5–5.1)
Sodium: 140 mEq/L (ref 135–145)
Total Bilirubin: 1 mg/dL (ref 0.2–1.2)
Total Protein: 6.8 g/dL (ref 6.0–8.3)

## 2018-04-14 NOTE — Telephone Encounter (Signed)
-----   Message from Lendon Collar, RT sent at 04/06/2018 10:29 AM EDT ----- Regarding: Lab orders for Thursday 04/14/18 Please enter CPE lab orders for 04/14/18 Thanks-Lauren

## 2018-04-21 ENCOUNTER — Encounter: Payer: Self-pay | Admitting: *Deleted

## 2018-04-22 ENCOUNTER — Encounter: Payer: Self-pay | Admitting: Family Medicine

## 2018-04-22 ENCOUNTER — Ambulatory Visit: Payer: Medicare HMO

## 2018-04-22 ENCOUNTER — Ambulatory Visit (INDEPENDENT_AMBULATORY_CARE_PROVIDER_SITE_OTHER): Payer: Medicare HMO | Admitting: Family Medicine

## 2018-04-22 ENCOUNTER — Ambulatory Visit (INDEPENDENT_AMBULATORY_CARE_PROVIDER_SITE_OTHER): Payer: Medicare HMO

## 2018-04-22 VITALS — BP 118/76 | HR 61 | Temp 98.6°F | Ht 70.5 in | Wt 212.5 lb

## 2018-04-22 DIAGNOSIS — I10 Essential (primary) hypertension: Secondary | ICD-10-CM

## 2018-04-22 DIAGNOSIS — E349 Endocrine disorder, unspecified: Secondary | ICD-10-CM

## 2018-04-22 DIAGNOSIS — Z0000 Encounter for general adult medical examination without abnormal findings: Secondary | ICD-10-CM

## 2018-04-22 DIAGNOSIS — E7800 Pure hypercholesterolemia, unspecified: Secondary | ICD-10-CM

## 2018-04-22 NOTE — Assessment & Plan Note (Signed)
Followed by [facility]..  Total  testosterone 02/2018 609.

## 2018-04-22 NOTE — Progress Notes (Signed)
Subjective:    Patient ID: Jack Norris, male    DOB: 1940-11-12, 77 y.o.   MRN: [id number]  HPI  The patient presents for complete physical and review of chronic health problems. He/She also has the following acute concerns today: none  Health maintenance:  Flu vaccine - postponed  Abnormal screenings:   None  04/22/18  Today  Hypertension:  Good control on lisinopril Using medication without problems or lightheadedness: none Chest pain with exertion:none Edema:none Short of breath:none Average home BPs: Other issues   new change.. Decrease in CR and GFR  Elevated Cholesterol:   Improved control from last year.. Not on statin.. Indication for statin present. Lab Results  Component Value Date   CHOL 159 04/14/2018   HDL 35.20 (L) 04/14/2018   LDLCALC 105 (H) 04/14/2018   LDLDIRECT 107.6 12/14/2011   TRIG 94.0 04/14/2018   CHOLHDL 5 04/14/2018  Using medications without problems: Muscle aches:  Diet compliance: moderate Exercise: good Other complaints:   Transurethral resection  Of bladder neck contracture.Marland Kitchen occ hematuria now, minimal pain.  He has been feeling more tired lately x few months.  He does snore, no known apnea. Wakes up feeling well rested. Feels like he sleeps well.  PHQ2 0 denies depression.  Nml testosterone, nml Hg.  Social History /Family History/Past Medical History reviewed in detail and updated in EMR if needed. Blood pressure 118/76, pulse 61, temperature 98.6 F (37 C), temperature source Oral, height 5' 10.5" (1.791 m), weight 212 lb 8 oz (96.4 kg), SpO2 97 %.     Review of Systems  Constitutional: Negative for fatigue and fever.  HENT: Negative for ear pain.   Eyes: Negative for pain.  Respiratory: Negative for cough and shortness of breath.   Cardiovascular: Negative for chest pain, palpitations and leg swelling.  Gastrointestinal: Negative for abdominal pain.  Genitourinary: Negative for dysuria.    Musculoskeletal: Negative for arthralgias.  Neurological: Negative for syncope, light-headedness and headaches.  Psychiatric/Behavioral: Negative for dysphoric mood.       Objective:   Physical Exam  Constitutional: He appears well-developed and well-nourished.  Non-toxic appearance. He does not appear ill. No distress.  HENT:  Head: Normocephalic and atraumatic.  Right Ear: Hearing, tympanic membrane, external ear and ear canal normal.  Left Ear: Hearing, tympanic membrane, external ear and ear canal normal.  Nose: Nose normal.  Mouth/Throat: Uvula is midline, oropharynx is clear and moist and mucous membranes are normal.  Eyes: Pupils are equal, round, and reactive to light. Conjunctivae, EOM and lids are normal. Lids are everted and swept, no foreign bodies found.  Neck: Trachea normal, normal range of motion and phonation normal. Neck supple. Carotid bruit is not present. No thyroid mass and no thyromegaly present.  Cardiovascular: Normal rate, regular rhythm, S1 normal, S2 normal, intact distal pulses and normal pulses. Exam reveals no gallop.  No murmur heard. Pulmonary/Chest: Breath sounds normal. He has no wheezes. He has no rhonchi. He has no rales.  Abdominal: Soft. Normal appearance and bowel sounds are normal. There is no hepatosplenomegaly. There is no tenderness. There is no rebound, no guarding and no CVA tenderness. No hernia.  Lymphadenopathy:    He has no cervical adenopathy.  Neurological: He is alert. He has normal strength and normal reflexes. No cranial nerve deficit or sensory deficit. Gait normal.  Skin: Skin is warm, dry and intact. No rash noted.  Psychiatric: He has a normal mood and affect. His speech is normal and behavior is  normal. Judgment normal.          Assessment & Plan:  The patient's preventative maintenance and recommended screening tests for an annual wellness exam were reviewed in full today. Brought up to date unless services  declined.  Counselled on the importance of diet, exercise, and its role in overall health and mortality. The patient's FH and SH was reviewed, including their home life, tobacco status, and drug and alcohol status.    Vaccines: Uptodate Colon: 05/2013, [place] nml, no further indicated.  Prostate: per uro. PSA  0.95  7/2019Followed there Dr. Diona Fanti  Former smoker, remotely.

## 2018-04-22 NOTE — Patient Instructions (Signed)
Mr. Bordner , Thank you for taking time to come for your Medicare Wellness Visit. I appreciate your ongoing commitment to your health goals. Please review the following plan we discussed and let me know if I can assist you in the future.   These are the goals we discussed: Goals    . Increase physical activity     Starting 04/22/2018, I will continue to walk at least 60 minutes 2-3 days per week.        This is a list of the screening recommended for you and due dates:  Health Maintenance  Topic Date Due  . Flu Shot  11/23/2018*  . Tetanus Vaccine  04/21/2019*  . Pneumonia vaccines  Completed  *Topic was postponed. The date shown is not the original due date.   Preventive Care for Adults  A healthy lifestyle and preventive care can promote health and wellness. Preventive health guidelines for adults include the following key practices.  . A routine yearly physical is a good way to check with your health care provider about your health and preventive screening. It is a chance to share any concerns and updates on your health and to receive a thorough exam.  . Visit your dentist for a routine exam and preventive care every 6 months. Brush your teeth twice a day and floss once a day. Good oral hygiene prevents tooth decay and gum disease.  . The frequency of eye exams is based on your age, health, family medical history, use  of contact lenses, and other factors. Follow your health care provider's recommendations for frequency of eye exams.  . Eat a healthy diet. Foods like vegetables, fruits, whole grains, low-fat dairy products, and lean protein foods contain the nutrients you need without too many calories. Decrease your intake of foods high in solid fats, added sugars, and salt. Eat the right amount of calories for you. Get information about a proper diet from your health care provider, if necessary.  . Regular physical exercise is one of the most important things you can do for  your health. Most adults should get at least 150 minutes of moderate-intensity exercise (any activity that increases your heart rate and causes you to sweat) each week. In addition, most adults need muscle-strengthening exercises on 2 or more days a week.  Silver Sneakers may be a benefit available to you. To determine eligibility, you may visit the website: www.silversneakers.com or contact program at (774)685-6152 Mon-Fri between 8AM-8PM.   . Maintain a healthy weight. The body mass index (BMI) is a screening tool to identify possible weight problems. It provides an estimate of body fat based on height and weight. Your health care provider can find your BMI and can help you achieve or maintain a healthy weight.   For adults 20 years and older: ? A BMI below 18.5 is considered underweight. ? A BMI of 18.5 to 24.9 is normal. ? A BMI of 25 to 29.9 is considered overweight. ? A BMI of 30 and above is considered obese.   . Maintain normal blood lipids and cholesterol levels by exercising and minimizing your intake of saturated fat. Eat a balanced diet with plenty of fruit and vegetables. Blood tests for lipids and cholesterol should begin at age 23 and be repeated every 5 years. If your lipid or cholesterol levels are high, you are over 50, or you are at high risk for heart disease, you may need your cholesterol levels checked more frequently. Ongoing high lipid and cholesterol  levels should be treated with medicines if diet and exercise are not working.  . If you smoke, find out from your health care provider how to quit. If you do not use tobacco, please do not start.  . If you choose to drink alcohol, please do not consume more than 2 drinks per day. One drink is considered to be 12 ounces (355 mL) of beer, 5 ounces (148 mL) of wine, or 1.5 ounces (44 mL) of liquor.  . If you are 49-22 years old, ask your health care provider if you should take aspirin to prevent strokes.  . Use sunscreen.  Apply sunscreen liberally and repeatedly throughout the day. You should seek shade when your shadow is shorter than you. Protect yourself by wearing long sleeves, pants, a wide-brimmed hat, and sunglasses year round, whenever you are outdoors.  . Once a month, do a whole body skin exam, using a mirror to look at the skin on your back. Tell your health care provider of new moles, moles that have irregular borders, moles that are larger than a pencil eraser, or moles that have changed in shape or color.

## 2018-04-22 NOTE — Assessment & Plan Note (Signed)
Well controlled. Continue current medication.  

## 2018-04-22 NOTE — Progress Notes (Signed)
I reviewed health advisor's note, was available for consultation, and agree with documentation and plan.  

## 2018-04-22 NOTE — Progress Notes (Signed)
PCP notes:   Health maintenance:  Flu vaccine - postponed  Abnormal screenings:   None  Patient concerns:   None  Nurse concerns:  None  Next PCP appt:   04/22/2018 @ 1000

## 2018-04-22 NOTE — Assessment & Plan Note (Signed)
AHA indicated statin but he is not agreeable. Cholesterol is better this year with lifestyle.

## 2018-04-22 NOTE — Progress Notes (Signed)
Subjective:   Jack Norris is a 77 y.o. male who presents for Medicare Annual/Subsequent preventive examination.  Review of Systems:  N/A Cardiac Risk Factors include: advanced age (>73men, >79 women);male gender;obesity (BMI >30kg/m2);dyslipidemia;hypertension     Objective:    Vitals: BP 118/76 (BP Location: Right Arm, Patient Position: Sitting, Cuff Size: Normal)   Pulse 61   Temp 98.6 F (37 C) (Oral)   Ht 5' 10.5" (1.791 m) Comment: shoes  Wt 212 lb 8 oz (96.4 kg)   SpO2 96%   BMI 30.06 kg/m   Body mass index is 30.06 kg/m.  Advanced Directives 04/22/2018 04/08/2018 04/20/2017 04/16/2016 09/08/2012 06/02/2012 01/11/2012  Does Patient Have a Medical Advance Directive? No No No No Patient does not have advance directive;Patient would not like information Patient does not have advance directive;Patient would not like information Patient does not have advance directive  Would patient like information on creating a medical advance directive? No - Patient declined No - Patient declined - Yes - Educational materials given - - -  Pre-existing out of facility DNR order (yellow form or pink MOST form) - - - - - No No    Tobacco Social History   Tobacco Use  Smoking Status Former Smoker  . Years: 8.00  . Types: Cigarettes  . Last attempt to quit: 01/05/1967  . Years since quitting: 51.3  Smokeless Tobacco Never Used  Tobacco Comment   QUIT AGE 27     Counseling given: No Comment: QUIT AGE 27   Clinical Intake:  Pre-visit preparation completed: Yes  Pain : No/denies pain Pain Score: 0-No pain     Nutritional Status: BMI > 30  Obese Nutritional Risks: None Diabetes: No  How often do you need to have someone help you when you read instructions, pamphlets, or other written materials from your doctor or pharmacy?: 1 - Never What is the last grade level you completed in school?: Associates degree, Bachelors degree   Interpreter Needed?: No  Comments: pt lives with  spouse Information entered by :: LPinson, LPN  Past Medical History:  Diagnosis Date  . Asthma, mild    as child-only now if has URI  . BPH (benign prostatic hypertrophy)   . BPPV (benign paroxysmal positional vertigo)   . Diverticulosis   . Eczema   . Fatty liver disease, nonalcoholic   . Frequent nosebleeds    History of  . GERD (gastroesophageal reflux disease)    occ uses TUMS as needed  . Glaucoma   . History of bronchitis   . History of colon polyps   . History of diverticulitis   . History of kidney stones   . Hyperlipemia   . Hypertension   . Influenza A 11/2017  . OA (osteoarthritis)   . Obesity   . PVCs (premature ventricular contractions)   . Renal calculi right    AND LEFT X1 NON-OBSTRUCTIVE  . Right sided sciatica    Bilateral buttock and thighs  . Simple renal cyst right interpoler, simpler in nature , asymptomatic  per urologist note (dr dahlstedt)  12-26-2011   BENIGN, has been drained  . Testosterone deficiency   . Wears glasses   . Wears hearing aid    Past Surgical History:  Procedure Laterality Date  . APPENDECTOMY    . bone spur removal     BILATERAL GREAT TOE  . COLONOSCOPY    . CYSTO/ RIGHT RETROGRADE PYELOGRAM/ RIGHT URETER DILATATION/ RIGHT URETEROSCOPY/ LASER LITHOTRIPSY RIGHT RENAL PELVIS AND  LOWER POLE CALCULI/ PLACEMENT STENT  01-11-2012  DR DAHLSTEDT   RIGHT RENAL PELVIS AND LOWER POLE CALCULI  . CYSTOSCOPY W/ RETROGRADES  06/02/2012   Procedure: CYSTOSCOPY WITH RETROGRADE PYELOGRAM;  Surgeon: Franchot Gallo, MD;  [facility];  Service: Urology;  Laterality: Right;  . CYSTOSCOPY WITH URETEROSCOPY  06/02/2012   Procedure: CYSTOSCOPY WITH URETEROSCOPY;  Surgeon: Franchot Gallo, MD;  Location: [facility];  Service: Urology;  Laterality: Right;  90 MIN ALSO EXTRACTION OF CALCULI   . LITHOTRIPSY  APPROX.  1998   ESWL  left kidney x 2  . ROTATOR CUFF REPAIR  2012   LEFT SHOULDER  . SHOULDER  ARTHROSCOPY WITH ROTATOR CUFF REPAIR AND SUBACROMIAL DECOMPRESSION  09/12/2012   Procedure: SHOULDER ARTHROSCOPY WITH ROTATOR CUFF REPAIR AND SUBACROMIAL DECOMPRESSION;  Surgeon: Nita Sells, MD;  Location: [place];  Service: Orthopedics;  Laterality: Right;  see above and biceps tendonotomy  . TRANSURETHRAL RESECTION OF BLADDER NECK N/A 04/08/2018   Procedure: TRANSURETHRAL RESECTION OF BLADDER NECK CONTRACTURE;  Surgeon: Franchot Gallo, MD;  Location: [facility];  Service: Urology;  Laterality: N/A;  . TRANSURETHRAL RESECTION OF PROSTATE  11-29-2009   BPH  . VASECTOMY  1970'S   GEN. ANES.   Family History  Problem Relation Age of Onset  . Cancer Mother        breast and kidney   . Squamous cell carcinoma Mother   . Cancer Father        lung  . Heart disease Father    Social History   Socioeconomic History  . Marital status: Married    Spouse name: Not on file  . Number of children: 3  . Years of education: Not on file  . Highest education level: Not on file  Occupational History  . Occupation: Industrial/product designer: [employer]  . Financial resource strain: Not on file  . Food insecurity:    Worry: Not on file    Inability: Not on file  . Transportation needs:    Medical: Not on file    Non-medical: Not on file  Tobacco Use  . Smoking status: Former Smoker    Years: 8.00    Types: Cigarettes    Last attempt to quit: 01/05/1967    Years since quitting: 51.3  . Smokeless tobacco: Never Used  . Tobacco comment: QUIT AGE 41  Substance and Sexual Activity  . Alcohol use: Yes    Alcohol/week: 0.0 standard drinks    Comment: OCCASIONAL  . Drug use: No  . Sexual activity: Yes  Lifestyle  . Physical activity:    Days per week: Not on file    Minutes per session: Not on file  . Stress: Not on file  Relationships  . Social connections:    Talks on phone: Not on file    Gets together: Not on  file    Attends religious service: Not on file    Active member of club or organization: Not on file    Attends meetings of clubs or organizations: Not on file    Relationship status: Not on file  Other Topics Concern  . Not on file  Social History Narrative   regular exercise    Full code, (Reviewed 2014)    Outpatient Encounter Medications as of 04/22/2018  Medication Sig  . albuterol (PROAIR HFA) 108 (90 Base) MCG/ACT inhaler Inhale 2 puffs into the lungs every  6 (six) hours as needed for wheezing or shortness of breath.  Marland Kitchen aspirin 81 MG tablet Take 81 mg by mouth daily.   . B-D 3CC LUER-LOK SYR 18GX1-1/2 18G X 1-1/2" 3 ML MISC USE ONCE A WEEK AS DIRECTED  . gemfibrozil (LOPID) 600 MG tablet TAKE 1 TABLET (600 MG TOTAL) BY MOUTH DAILY.  Marland Kitchen latanoprost (XALATAN) 0.005 % ophthalmic solution Place 1 drop into the right eye at bedtime. (Patient taking differently: Place 1 drop into the right eye at bedtime. )  . lisinopril (PRINIVIL,ZESTRIL) 20 MG tablet TAKE 1 TABLET BY MOUTH EVERY DAY  . psyllium (METAMUCIL) 58.6 % powder Take 1 packet by mouth daily.  Marland Kitchen testosterone cypionate (DEPOTESTOSTERONE CYPIONATE) 200 MG/ML injection Inject 0.5 mLs (100 mg total) into the muscle every 7 (seven) days.  . diclofenac (VOLTAREN) 75 MG EC tablet Take 1 tablet (75 mg total) by mouth 2 (two) times daily. Start as needed once prednsione completed. (Patient not taking: Reported on 04/22/2018)  . meclizine (ANTIVERT) 25 MG tablet Take 25 mg by mouth as needed.   No facility-administered encounter medications on file as of 04/22/2018.     Activities of Daily Living In your present state of health, do you have any difficulty performing the following activities: 04/22/2018 04/08/2018  Hearing? Y Y  Comment - wears hearing aids not with the patient   hears better out of left ear  Vision? N N  Difficulty concentrating or making decisions? N N  Walking or climbing stairs? N N  Dressing or bathing? N N  Doing  errands, shopping? N -  Preparing Food and eating ? N -  Using the Toilet? N -  In the past six months, have you accidently leaked urine? N -  Do you have problems with loss of bowel control? N -  Managing your Medications? N -  Managing your Finances? N -  Housekeeping or managing your Housekeeping? N -  Some recent data might be hidden    Patient Care Team: Jinny Sanders, MD as PCP - General Clent Jacks, MD as Consulting Physician (Ophthalmology) Franchot Gallo, MD as Consulting Physician (Urology) Jannet Mantis, MD as Consulting Physician (Dermatology)   Assessment:   This is a routine wellness examination for Domnic.  Hearing Screening Comments: Bilateral hearing aids Vision Screening Comments: October 2018 with Dr. Katy Fitch    Exercise Activities and Dietary recommendations Current Exercise Habits: Home exercise routine, Type of exercise: walking, Time (Minutes): 60(2.5 miles), Frequency (Times/Week): 3, Weekly Exercise (Minutes/Week): 180, Intensity: Moderate, Exercise limited by: None identified  Goals    . Increase physical activity     Starting 04/22/2018, I will continue to walk at least 60 minutes 2-3 days per week.        Fall Risk Fall Risk  04/22/2018 04/20/2017 04/16/2016 04/15/2015 02/06/2014  Falls in the past year? No No No No No  Depression Screen PHQ 2/9 Scores 04/22/2018 04/20/2017 04/16/2016 04/15/2015  PHQ - 2 Score 0 0 0 0  PHQ- 9 Score 0 - - -    Cognitive Function MMSE - Mini Mental State Exam 04/22/2018 04/20/2017 04/16/2016  Orientation to time 5 5 5   Orientation to Place 5 5 5   Registration 3 3 3   Attention/ Calculation 0 0 0  Recall 3 3 3   Language- name 2 objects 0 0 0  Language- repeat 1 1 1   Language- follow 3 step command 3 3 3   Language- read & follow direction 0 0 0  Write a sentence 0 0 0  Copy design 0 0 0  Total score 20 20 20      PLEASE NOTE: A Mini-Cog screen was completed. Maximum score is 20. A value of 0 denotes this  part of Folstein MMSE was not completed or the patient failed this part of the Mini-Cog screening.   Mini-Cog Screening Orientation to Time - Max 5 pts Orientation to Place - Max 5 pts Registration - Max 3 pts Recall - Max 3 pts Language Repeat - Max 1 pts Language Follow 3 Step Command - Max 3 pts   Immunization History  Administered Date(s) Administered  . Influenza Split 06/26/2011, 07/11/2012  . Influenza Whole 05/24/2008, 06/24/2008, 07/04/2009, 05/24/2010  . Influenza,inj,Quad PF,6+ Mos 07/26/2013, 06/12/2014, 04/15/2015, 04/16/2016, 07/01/2017  . Pneumococcal Conjugate-13 04/15/2015  . Pneumococcal Polysaccharide-23 09/14/2007  . Td 08/02/2007  . Zoster 04/08/2009    Screening Tests Health Maintenance  Topic Date Due  . INFLUENZA VACCINE  11/23/2018 (Originally 03/24/2018)  . TETANUS/TDAP  04/21/2019 (Originally 08/01/2017)  . PNA vac Low Risk Adult  Completed     Plan:     I have personally reviewed, addressed, and noted the following in the patient's chart:  A. Medical and social history B. Use of alcohol, tobacco or illicit drugs  C. Current medications and supplements D. Functional ability and status E.  Nutritional status F.  Physical activity G. Advance directives H. List of other physicians I.  Hospitalizations, surgeries, and ER visits in previous 12 months J.  [place] to include hearing, vision, cognitive, depression L. Referrals and appointments - none  In addition, I have reviewed and discussed with patient certain preventive protocols, quality metrics, and best practice recommendations. A written personalized care plan for preventive services as well as general preventive health recommendations were provided to patient.  See attached scanned questionnaire for additional information.   Signed,   Lindell Noe, MHA, BS, LPN Health Coach

## 2018-04-22 NOTE — Patient Instructions (Addendum)
Increase water intake.  Get back to regular exercise  Call if fatigue not improving as expected... May need more blood work, sleep test.

## 2018-04-29 ENCOUNTER — Encounter: Payer: Self-pay | Admitting: Family Medicine

## 2018-05-03 DIAGNOSIS — H6123 Impacted cerumen, bilateral: Secondary | ICD-10-CM | POA: Diagnosis not present

## 2018-05-03 DIAGNOSIS — H903 Sensorineural hearing loss, bilateral: Secondary | ICD-10-CM | POA: Diagnosis not present

## 2018-05-15 DIAGNOSIS — J029 Acute pharyngitis, unspecified: Secondary | ICD-10-CM | POA: Diagnosis not present

## 2018-05-15 DIAGNOSIS — Z2089 Contact with and (suspected) exposure to other communicable diseases: Secondary | ICD-10-CM | POA: Diagnosis not present

## 2018-05-15 DIAGNOSIS — J069 Acute upper respiratory infection, unspecified: Secondary | ICD-10-CM | POA: Diagnosis not present

## 2018-05-17 DIAGNOSIS — J069 Acute upper respiratory infection, unspecified: Secondary | ICD-10-CM | POA: Diagnosis not present

## 2018-05-17 DIAGNOSIS — J029 Acute pharyngitis, unspecified: Secondary | ICD-10-CM | POA: Diagnosis not present

## 2018-05-17 DIAGNOSIS — Z2089 Contact with and (suspected) exposure to other communicable diseases: Secondary | ICD-10-CM | POA: Diagnosis not present

## 2018-05-17 DIAGNOSIS — R51 Headache: Secondary | ICD-10-CM | POA: Diagnosis not present

## 2018-05-20 DIAGNOSIS — R3912 Poor urinary stream: Secondary | ICD-10-CM | POA: Diagnosis not present

## 2018-06-16 ENCOUNTER — Ambulatory Visit: Payer: Medicare HMO | Admitting: Interventional Cardiology

## 2018-06-16 ENCOUNTER — Encounter: Payer: Self-pay | Admitting: Interventional Cardiology

## 2018-06-16 ENCOUNTER — Other Ambulatory Visit: Payer: Self-pay | Admitting: Interventional Cardiology

## 2018-06-16 VITALS — BP 144/82 | HR 73 | Ht 70.5 in | Wt 211.4 lb

## 2018-06-16 DIAGNOSIS — R072 Precordial pain: Secondary | ICD-10-CM | POA: Diagnosis not present

## 2018-06-16 DIAGNOSIS — Z8249 Family history of ischemic heart disease and other diseases of the circulatory system: Secondary | ICD-10-CM

## 2018-06-16 DIAGNOSIS — E7800 Pure hypercholesterolemia, unspecified: Secondary | ICD-10-CM

## 2018-06-16 DIAGNOSIS — I10 Essential (primary) hypertension: Secondary | ICD-10-CM | POA: Diagnosis not present

## 2018-06-16 DIAGNOSIS — Z01812 Encounter for preprocedural laboratory examination: Secondary | ICD-10-CM

## 2018-06-16 MED ORDER — METOPROLOL TARTRATE 50 MG PO TABS: ORAL_TABLET | ORAL | 0 refills | Status: DC

## 2018-06-16 NOTE — Patient Instructions (Addendum)
Medication Instructions:  Your physician recommends that you continue on your current medications as directed. Please refer to the Current Medication list given to you today.  If you need a refill on your cardiac medications before your next appointment, please call your pharmacy.   Lab work: Your physician recommends that you return for lab work Artist) prior to Cardiac CT   If you have labs (blood work) drawn today and your tests are completely normal, you will receive your results only by: Marland Kitchen MyChart Message (if you have MyChart) OR . A paper copy in the mail If you have any lab test that is abnormal or we need to change your treatment, we will call you to review the results.  Testing/Procedures: Your physician has requested that you have cardiac CT. Cardiac computed tomography (CT) is a painless test that uses an x-ray machine to take clear, detailed pictures of your heart. For further information please visit [email or web address].[email or web address]. Please follow instruction sheet as given.  Follow-Up: At [facility], you and your health needs are our priority.  As part of our continuing mission to provide you with exceptional heart care, we have created designated Provider Care Teams.  These Care Teams include your primary Cardiologist (physician) and Advanced Practice Providers (APPs -  Physician Assistants and Nurse Practitioners) who all work together to provide you with the care you need, when you need it. . You will need a follow up appointment in 1 year.  Please call our office 2 months in advance to schedule this appointment.  You may see Casandra Doffing, MD or one of the following Advanced Practice Providers on your designated Care Team:   . Lyda Jester, PA-C . Dayna Dunn, PA-C . Ermalinda Barrios, PA-C  Any Other Special Instructions Will Be Listed Below (If Applicable).  CARDIAC CT INSTRUCTIONS  Please arrive at the [facility] main entrance of [facility] on _____at ______ AM (30-45  minutes prior to test start time)  [facility] [place], [place] [address] [phone]  Proceed to the [facility] Radiology Department (First Floor).  Please follow these instructions carefully (unless otherwise directed):  Hold all erectile dysfunction medications at least 48 hours prior to test.  On the Night Before the Test: . Be sure to Drink plenty of water. . Do not consume any caffeinated/decaffeinated beverages or chocolate 12 hours prior to your test. . Do not take any antihistamines 12 hours prior to your test.   On the Day of the Test: . Drink plenty of water. Do not drink any water within one hour of the test. . Do not eat any food 4 hours prior to the test. . You may take your regular medications prior to the test.  . Take metoprolol (Lopressor) two hours prior to test.       After the Test: . Drink plenty of water. . After receiving IV contrast, you may experience a mild flushed feeling. This is normal. . On occasion, you may experience a mild rash up to 24 hours after the test. This is not dangerous. If this occurs, you can take Benadryl 25 mg and increase your fluid intake. . If you experience trouble breathing, this can be serious. If it is severe call 911 IMMEDIATELY. If it is mild, please call our office.

## 2018-06-16 NOTE — Progress Notes (Signed)
Cardiology Office Note   Date:  06/16/2018   ID:  Jack Norris, DOB 1941-05-29, MRN [id number]  PCP:  Jinny Sanders, MD    No chief complaint on file.  Chest pain  Wt Readings from Last 3 Encounters:  06/16/18 211 lb 6.4 oz (95.9 kg)  04/22/18 212 lb 8 oz (96.4 kg)  04/22/18 212 lb 8 oz (96.4 kg)       History of Present Illness: Jack Norris is a 77 y.o. male  With hyperlipidemia. He has been on lopid for years.   He has had palpitations in the past. No management with a cardiologist at that time. Sx have resolved.  He has lost weight in the past.  His father had CAD, CABG x 4, chronic smoker. Mother passed away from cancer at 95.    Stress test in 2018: Blood pressure demonstrated a hypertensive response to exercise.  There was no ST segment deviation noted during stress.  No ischemia. Very frequent monomorphic PVCs during peak stress and occasional PVCs in recovery. Normal exercise tolerance. Hypertensive response to exercise with max blood pressure 213/87 mmHg.  He had the flu in early 2019.   Several weeks ago, he had some chest pain while in the dentist office.  He felt clammy.  No regular exercise.  He does mow the lawn.  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 He feels well with that activity.      Past Medical History:  Diagnosis Date  . Asthma, mild    as child-only now if has URI  . BPH (benign prostatic hypertrophy)   . BPPV (benign paroxysmal positional vertigo)   . Diverticulosis   . Eczema   . Fatty liver disease, nonalcoholic   . Frequent nosebleeds    History of  . GERD (gastroesophageal reflux disease)    occ uses TUMS as needed  . Glaucoma   . History of bronchitis   . History of colon polyps   . History of diverticulitis   . History of kidney stones   . Hyperlipemia   . Hypertension   . Influenza A 11/2017  . OA (osteoarthritis)   . Obesity   . PVCs (premature ventricular contractions)   . Renal calculi right    AND  LEFT X1 NON-OBSTRUCTIVE  . Right sided sciatica    Bilateral buttock and thighs  . Simple renal cyst right interpoler, simpler in nature , asymptomatic  per urologist note (dr dahlstedt)  12-26-2011   BENIGN, has been drained  . Testosterone deficiency   . Wears glasses   . Wears hearing aid     Past Surgical History:  Procedure Laterality Date  . APPENDECTOMY    . bone spur removal     BILATERAL GREAT TOE  . COLONOSCOPY    . CYSTO/ RIGHT RETROGRADE PYELOGRAM/ RIGHT URETER DILATATION/ RIGHT URETEROSCOPY/ LASER LITHOTRIPSY RIGHT RENAL PELVIS AND LOWER POLE CALCULI/ PLACEMENT STENT  01-11-2012  DR DAHLSTEDT   RIGHT RENAL PELVIS AND LOWER POLE CALCULI  . CYSTOSCOPY W/ RETROGRADES  06/02/2012   Procedure: CYSTOSCOPY WITH RETROGRADE PYELOGRAM;  Surgeon: Franchot Gallo, MD;  [facility]: [facility];  Service: Urology;  Laterality: Right;  . CYSTOSCOPY WITH URETEROSCOPY  06/02/2012   Procedure: CYSTOSCOPY WITH URETEROSCOPY;  Surgeon: Franchot Gallo, MD;  Location: [facility];  Service: Urology;  Laterality: Right;  90 MIN ALSO EXTRACTION OF CALCULI   . LITHOTRIPSY  APPROX.  1998   ESWL  left kidney x 2  .  ROTATOR CUFF REPAIR  2012   LEFT SHOULDER  . SHOULDER ARTHROSCOPY WITH ROTATOR CUFF REPAIR AND SUBACROMIAL DECOMPRESSION  09/12/2012   Procedure: SHOULDER ARTHROSCOPY WITH ROTATOR CUFF REPAIR AND SUBACROMIAL DECOMPRESSION;  Surgeon: Nita Sells, MD;  Location: [facility];  Service: Orthopedics;  Laterality: Right;  see above and biceps tendonotomy  . TRANSURETHRAL RESECTION OF BLADDER NECK N/A 04/08/2018   Procedure: TRANSURETHRAL RESECTION OF BLADDER NECK CONTRACTURE;  Surgeon: Franchot Gallo, MD;  Location: [facility];  Service: Urology;  Laterality: N/A;  . TRANSURETHRAL RESECTION OF PROSTATE  11-29-2009   BPH  . VASECTOMY  1970'S   GEN. ANES.     Current Outpatient Medications  Medication Sig  Dispense Refill  . albuterol (PROAIR HFA) 108 (90 Base) MCG/ACT inhaler Inhale 2 puffs into the lungs every 6 (six) hours as needed for wheezing or shortness of breath. 18 g 2  . aspirin 81 MG tablet Take 81 mg by mouth daily.     . B-D 3CC LUER-LOK SYR 18GX1-1/2 18G X 1-1/2" 3 ML MISC USE ONCE A WEEK AS DIRECTED  2  . gemfibrozil (LOPID) 600 MG tablet TAKE 1 TABLET (600 MG TOTAL) BY MOUTH DAILY. 90 tablet 3  . latanoprost (XALATAN) 0.005 % ophthalmic solution Place 1 drop into the right eye at bedtime. (Patient taking differently: Place 1 drop into the right eye at bedtime. ) 2.5 mL 12  . lisinopril (PRINIVIL,ZESTRIL) 20 MG tablet TAKE 1 TABLET BY MOUTH EVERY DAY 90 tablet 0  . psyllium (METAMUCIL) 58.6 % powder Take 1 packet by mouth daily.    Marland Kitchen testosterone cypionate (DEPOTESTOSTERONE CYPIONATE) 200 MG/ML injection Inject 0.5 mLs (100 mg total) into the muscle every 7 (seven) days. 10 mL 0   No current facility-administered medications for this visit.     Allergies:   Paroxetine; Morphine and related; Ciprofloxacin; and Flagyl [metronidazole hcl]    Social History:  The patient  reports that he quit smoking about 51 years ago. His smoking use included cigarettes. He quit after 8.00 years of use. He has never used smokeless tobacco. He reports that he drinks alcohol. He reports that he does not use drugs.   Family History:  The patient's family history includes Cancer in his father and mother; Heart disease in his father; Squamous cell carcinoma in his mother.    ROS:  Please see the history of present illness.   Otherwise, review of systems are positive for chest pain.   All other systems are reviewed and negative.    PHYSICAL EXAM: VS:  BP (!) 144/82   Pulse 73   Ht 5' 10.5" (1.791 m)   Wt 211 lb 6.4 oz (95.9 kg)   SpO2 98%   BMI 29.90 kg/m  , BMI Body mass index is 29.9 kg/m. GEN: Well nourished, well developed, in no acute distress  HEENT: normal  Neck: no JVD, carotid  bruits, or masses Cardiac: RRR; no murmurs, rubs, or gallops,no edema  Respiratory:  clear to auscultation bilaterally, normal work of breathing GI: soft, nontender, nondistended, + BS MS: no deformity or atrophy  Skin: warm and dry, no rash Neuro:  Strength and sensation are intact Psych: euthymic mood, full affect   EKG:   The ekg ordered today demonstrates NSR, IRBBB, no ST changes   Recent Labs: 04/08/2018: Hemoglobin 15.0 04/14/2018: ALT 10; BUN 23; Creatinine, Ser 1.26; Potassium 4.4; Sodium 140   Lipid Panel    Component Value Date/Time  CHOL 159 04/14/2018 0854   TRIG 94.0 04/14/2018 0854   HDL 35.20 (L) 04/14/2018 0854   CHOLHDL 5 04/14/2018 0854   VLDL 18.8 04/14/2018 0854   LDLCALC 105 (H) 04/14/2018 0854   LDLDIRECT 107.6 12/14/2011 0954     Other studies Reviewed: Additional studies/ records that were reviewed today with results demonstrating: LDL 105.   ASSESSMENT AND PLAN:  1. Chest pain: Plan or coronary CTA given RF for CAD.  He will let us know if sx get worse.  If they did accelerate, could consider cath. 2. RF for CAD: Age, family h/o and gender.  Prior borderline DM, better with weight loss.  3. Hyperlipidemia: LDL target may change based on w/u.  May need to consider statin going forward. 4. HTN: Bordelrine control.  Had high BP with exercise.  WOuld consdeir adding an ACE-I after dye exposure.    Current medicines are reviewed at length with the patient today.  The patient concerns regarding his medicines were addressed.  The following changes have been made:  No change  Labs/ tests ordered today include:  No orders of the defined types were placed in this encounter.   Recommend 150 minutes/week of aerobic exercise Low fat, low carb, high fiber diet recommended  Disposition:   FU  Post test   Signed, Larae Grooms, MD  06/16/2018 10:45 AM    [facility] [address], [place], [place]  [address] Phone: [phone]; Fax: [phone]

## 2018-06-20 ENCOUNTER — Other Ambulatory Visit: Payer: Self-pay | Admitting: *Deleted

## 2018-06-20 MED ORDER — GEMFIBROZIL 600 MG PO TABS: 600.0000 mg | ORAL_TABLET | Freq: Every day | ORAL | 3 refills | Status: DC

## 2018-06-23 ENCOUNTER — Telehealth: Payer: Self-pay | Admitting: Interventional Cardiology

## 2018-06-23 NOTE — Telephone Encounter (Signed)
No message needed °

## 2018-07-06 DIAGNOSIS — H43813 Vitreous degeneration, bilateral: Secondary | ICD-10-CM | POA: Diagnosis not present

## 2018-07-06 DIAGNOSIS — H35372 Puckering of macula, left eye: Secondary | ICD-10-CM | POA: Diagnosis not present

## 2018-07-06 DIAGNOSIS — H43393 Other vitreous opacities, bilateral: Secondary | ICD-10-CM | POA: Diagnosis not present

## 2018-07-06 DIAGNOSIS — H40051 Ocular hypertension, right eye: Secondary | ICD-10-CM | POA: Diagnosis not present

## 2018-07-06 DIAGNOSIS — H2513 Age-related nuclear cataract, bilateral: Secondary | ICD-10-CM | POA: Diagnosis not present

## 2018-07-12 ENCOUNTER — Other Ambulatory Visit: Payer: Self-pay | Admitting: Family Medicine

## 2018-07-12 DIAGNOSIS — H43812 Vitreous degeneration, left eye: Secondary | ICD-10-CM | POA: Diagnosis not present

## 2018-07-12 DIAGNOSIS — H35373 Puckering of macula, bilateral: Secondary | ICD-10-CM | POA: Diagnosis not present

## 2018-07-12 DIAGNOSIS — H35372 Puckering of macula, left eye: Secondary | ICD-10-CM | POA: Diagnosis not present

## 2018-07-12 DIAGNOSIS — H2513 Age-related nuclear cataract, bilateral: Secondary | ICD-10-CM | POA: Diagnosis not present

## 2018-07-19 ENCOUNTER — Other Ambulatory Visit: Payer: Medicare HMO | Admitting: *Deleted

## 2018-07-19 DIAGNOSIS — Z01812 Encounter for preprocedural laboratory examination: Secondary | ICD-10-CM | POA: Diagnosis not present

## 2018-07-19 DIAGNOSIS — Z8249 Family history of ischemic heart disease and other diseases of the circulatory system: Secondary | ICD-10-CM | POA: Diagnosis not present

## 2018-07-19 DIAGNOSIS — I10 Essential (primary) hypertension: Secondary | ICD-10-CM

## 2018-07-19 DIAGNOSIS — R072 Precordial pain: Secondary | ICD-10-CM | POA: Diagnosis not present

## 2018-07-19 DIAGNOSIS — E7800 Pure hypercholesterolemia, unspecified: Secondary | ICD-10-CM | POA: Diagnosis not present

## 2018-07-19 LAB — BASIC METABOLIC PANEL
BUN / CREAT RATIO: 14 (ref 10–24)
BUN: 18 mg/dL (ref 8–27)
CO2: 23 mmol/L (ref 20–29)
Calcium: 9.7 mg/dL (ref 8.6–10.2)
Chloride: 100 mmol/L (ref 96–106)
Creatinine, Ser: 1.3 mg/dL — ABNORMAL HIGH (ref 0.76–1.27)
GFR calc Af Amer: 61 mL/min/{1.73_m2} (ref >59)
GFR, EST NON AFRICAN AMERICAN: 53 mL/min/{1.73_m2} — AB (ref >59)
Glucose: 89 mg/dL (ref 65–99)
POTASSIUM: 5 mmol/L (ref 3.5–5.2)
SODIUM: 139 mmol/L (ref 134–144)

## 2018-07-27 ENCOUNTER — Ambulatory Visit (HOSPITAL_COMMUNITY): Payer: Medicare HMO

## 2018-07-27 DIAGNOSIS — R3915 Urgency of urination: Secondary | ICD-10-CM | POA: Diagnosis not present

## 2018-07-27 DIAGNOSIS — N401 Enlarged prostate with lower urinary tract symptoms: Secondary | ICD-10-CM | POA: Diagnosis not present

## 2018-07-28 ENCOUNTER — Ambulatory Visit (HOSPITAL_COMMUNITY)
Admission: RE | Admit: 2018-07-28 | Discharge: 2018-07-28 | Disposition: A | Discharge status: Home / Self Care | Payer: Medicare HMO | Source: Ambulatory Visit | Attending: Interventional Cardiology | Admitting: Interventional Cardiology

## 2018-07-28 ENCOUNTER — Encounter (HOSPITAL_COMMUNITY): Payer: Self-pay

## 2018-07-28 DIAGNOSIS — Z8249 Family history of ischemic heart disease and other diseases of the circulatory system: Secondary | ICD-10-CM

## 2018-07-28 DIAGNOSIS — E7800 Pure hypercholesterolemia, unspecified: Secondary | ICD-10-CM | POA: Diagnosis not present

## 2018-07-28 DIAGNOSIS — I10 Essential (primary) hypertension: Secondary | ICD-10-CM | POA: Diagnosis not present

## 2018-07-28 DIAGNOSIS — R072 Precordial pain: Secondary | ICD-10-CM | POA: Diagnosis not present

## 2018-07-28 MED ORDER — IOPAMIDOL (ISOVUE-370) INJECTION 76%
100.0000 mL | Freq: Once | INTRAVENOUS | Status: AC | PRN
  Administered 2018-07-28: 100 mL via INTRAVENOUS

## 2018-07-28 MED ORDER — NITROGLYCERIN 0.4 MG SL SUBL
SUBLINGUAL_TABLET | SUBLINGUAL | Status: AC
  Filled 2018-07-28: qty 2

## 2018-07-28 MED ORDER — NITROGLYCERIN 0.4 MG SL SUBL
0.8000 mg | SUBLINGUAL_TABLET | Freq: Once | SUBLINGUAL | Status: AC
  Administered 2018-07-28: 0.8 mg via SUBLINGUAL
  Filled 2018-07-28: qty 25

## 2018-08-01 DIAGNOSIS — G4733 Obstructive sleep apnea (adult) (pediatric): Secondary | ICD-10-CM | POA: Diagnosis not present

## 2018-08-01 DIAGNOSIS — J340 Abscess, furuncle and carbuncle of nose: Secondary | ICD-10-CM | POA: Diagnosis not present

## 2018-08-01 DIAGNOSIS — H903 Sensorineural hearing loss, bilateral: Secondary | ICD-10-CM | POA: Diagnosis not present

## 2018-08-01 NOTE — Telephone Encounter (Signed)
See result note for Cardiac CT

## 2018-08-02 ENCOUNTER — Ambulatory Visit: Payer: Medicare HMO | Admitting: Family Medicine

## 2018-08-02 DIAGNOSIS — H43813 Vitreous degeneration, bilateral: Secondary | ICD-10-CM | POA: Diagnosis not present

## 2018-08-02 DIAGNOSIS — H40051 Ocular hypertension, right eye: Secondary | ICD-10-CM | POA: Diagnosis not present

## 2018-08-02 DIAGNOSIS — H35372 Puckering of macula, left eye: Secondary | ICD-10-CM | POA: Diagnosis not present

## 2018-08-02 DIAGNOSIS — H43393 Other vitreous opacities, bilateral: Secondary | ICD-10-CM | POA: Diagnosis not present

## 2018-08-02 DIAGNOSIS — H2513 Age-related nuclear cataract, bilateral: Secondary | ICD-10-CM | POA: Diagnosis not present

## 2018-08-08 ENCOUNTER — Telehealth: Payer: Self-pay | Admitting: *Deleted

## 2018-08-08 NOTE — Telephone Encounter (Signed)
Jack Norris has CT done with his cardiologist.  They mentioned to him that the CT showed a 30mm nodule in his left lungs.  They were suppose to send the results to Dr. Diona Browner for her recommendations.  Please advise.

## 2018-08-10 DIAGNOSIS — H2513 Age-related nuclear cataract, bilateral: Secondary | ICD-10-CM | POA: Diagnosis not present

## 2018-08-10 DIAGNOSIS — H35373 Puckering of macula, bilateral: Secondary | ICD-10-CM | POA: Diagnosis not present

## 2018-08-10 DIAGNOSIS — H43812 Vitreous degeneration, left eye: Secondary | ICD-10-CM | POA: Diagnosis not present

## 2018-08-10 NOTE — Telephone Encounter (Signed)
Jack Norris notified as instructed by telephone.  Patient states understanding.  

## 2018-08-10 NOTE — Telephone Encounter (Signed)
Left message for [name] to return my call. 

## 2018-08-10 NOTE — Telephone Encounter (Signed)
Let pt know.. recommendations are as follows.. Bilateral pulmonary nodules measuring up to 6 mm. Non-contrast chest CT at 3-6 months is recommended. We will call him to set up  in about 3-4 months.    Notes to self: If the nodules are stable at time of repeat CT, then future CT at 18-24 months (from today's scan) is considered optional for low-risk patients, but is recommended for high-risk patients.    Given her is a former smoker( very remotely).. he is medium risk.

## 2018-08-11 ENCOUNTER — Ambulatory Visit: Payer: Medicare HMO | Attending: Otolaryngology

## 2018-08-11 DIAGNOSIS — G4733 Obstructive sleep apnea (adult) (pediatric): Secondary | ICD-10-CM | POA: Diagnosis not present

## 2018-08-11 DIAGNOSIS — G4730 Sleep apnea, unspecified: Secondary | ICD-10-CM | POA: Diagnosis not present

## 2018-08-24 DIAGNOSIS — N3281 Overactive bladder: Secondary | ICD-10-CM

## 2018-08-24 HISTORY — DX: Overactive bladder: N32.81

## 2018-08-24 HISTORY — PX: EYE SURGERY: SHX253

## 2018-08-25 NOTE — Telephone Encounter (Signed)
Sleep study results mailed to Jack Norris.

## 2018-09-14 ENCOUNTER — Encounter: Payer: Self-pay | Admitting: Interventional Cardiology

## 2018-09-14 ENCOUNTER — Ambulatory Visit: Payer: Medicare HMO | Admitting: Interventional Cardiology

## 2018-09-14 VITALS — BP 150/88 | HR 97 | Ht 70.5 in | Wt 217.8 lb

## 2018-09-14 DIAGNOSIS — E669 Obesity, unspecified: Secondary | ICD-10-CM

## 2018-09-14 DIAGNOSIS — Z6830 Body mass index (BMI) 30.0-30.9, adult: Secondary | ICD-10-CM

## 2018-09-14 DIAGNOSIS — E7800 Pure hypercholesterolemia, unspecified: Secondary | ICD-10-CM | POA: Diagnosis not present

## 2018-09-14 DIAGNOSIS — I2510 Atherosclerotic heart disease of native coronary artery without angina pectoris: Secondary | ICD-10-CM

## 2018-09-14 MED ORDER — ROSUVASTATIN CALCIUM 5 MG PO TABS: 5.0000 mg | ORAL_TABLET | Freq: Every day | ORAL | 3 refills | Status: DC

## 2018-09-14 MED ORDER — FENOFIBRATE 67 MG PO CAPS: 67.0000 mg | ORAL_CAPSULE | Freq: Every day | ORAL | 3 refills | Status: DC

## 2018-09-14 NOTE — Progress Notes (Signed)
Cardiology Office Note   Date:  09/14/2018   ID:  Jack Norris, DOB Jun 30, 1941, MRN [id number]  PCP:  Jack Sanders, MD    No chief complaint on file.  CAD  Wt Readings from Last 3 Encounters:  09/14/18 217 lb 12.8 oz (98.8 kg)  06/16/18 211 lb 6.4 oz (95.9 kg)  04/22/18 212 lb 8 oz (96.4 kg)       History of Present Illness: Jack Norris is a 78 y.o. male  With hyperlipidemia. He has been on lopid for years.   He has had palpitations in the past. No management with a cardiologist at that time. Sx have resolved.  He has lost weightin the past.  His father had CAD, CABG x 4, chronic smoker. Mother passed away from cancer at 95.   Stress test in 2018:Blood pressure demonstrated a hypertensive response to exercise.  There was no ST segment deviation noted during stress.  No ischemia. Very frequent monomorphic PVCs during peak stress and occasional PVCs in recovery. Normal exercise tolerance. Hypertensive response to exercise with max blood pressure 213/87 mmHg.  He had the flu in early 2019.  In late 2019, he had some chest discomfort while at the dentist.   He underwent CT Angie of the coronary arteries.  FFR in all 3 coronary distributions was essentially negative, with the exception of the very distal LAD.  However, no stenosis was identified by angiography.  There was suspicion of a PDA lesion of significance, but not mention to be significant by FFR.  At that point, we told him to watch for symptoms.  Any further symptoms would have [place] perform coronary angiography.  He has some twinges in his chest at times.  Not related to exertion.    Past Medical History:  Diagnosis Date  . Asthma, mild    as child-only now if has URI  . BPH (benign prostatic hypertrophy)   . BPPV (benign paroxysmal positional vertigo)   . Diverticulosis   . Eczema   . Fatty liver disease, nonalcoholic   . Frequent nosebleeds    History of  . GERD  (gastroesophageal reflux disease)    occ uses TUMS as needed  . Glaucoma   . History of bronchitis   . History of colon polyps   . History of diverticulitis   . History of kidney stones   . Hyperlipemia   . Hypertension   . Influenza A 11/2017  . OA (osteoarthritis)   . Obesity   . PVCs (premature ventricular contractions)   . Renal calculi right    AND LEFT X1 NON-OBSTRUCTIVE  . Right sided sciatica    Bilateral buttock and thighs  . Simple renal cyst right interpoler, simpler in nature , asymptomatic  per urologist note (dr dahlstedt)  12-26-2011   BENIGN, has been drained  . Testosterone deficiency   . Wears glasses   . Wears hearing aid     Past Surgical History:  Procedure Laterality Date  . APPENDECTOMY    . bone spur removal     BILATERAL GREAT TOE  . COLONOSCOPY    . CYSTO/ RIGHT RETROGRADE PYELOGRAM/ RIGHT URETER DILATATION/ RIGHT URETEROSCOPY/ LASER LITHOTRIPSY RIGHT RENAL PELVIS AND LOWER POLE CALCULI/ PLACEMENT STENT  01-11-2012  DR DAHLSTEDT   RIGHT RENAL PELVIS AND LOWER POLE CALCULI  . CYSTOSCOPY W/ RETROGRADES  06/02/2012   Procedure: CYSTOSCOPY WITH RETROGRADE PYELOGRAM;  Surgeon: Franchot Gallo, MD;  Location: [facility];  Service: Urology;  Laterality: Right;  . CYSTOSCOPY WITH URETEROSCOPY  06/02/2012   Procedure: CYSTOSCOPY WITH URETEROSCOPY;  Surgeon: Franchot Gallo, MD;  Location: [facility];  Service: Urology;  Laterality: Right;  90 MIN ALSO EXTRACTION OF CALCULI   . LITHOTRIPSY  APPROX.  1998   ESWL  left kidney x 2  . ROTATOR CUFF REPAIR  2012   LEFT SHOULDER  . SHOULDER ARTHROSCOPY WITH ROTATOR CUFF REPAIR AND SUBACROMIAL DECOMPRESSION  09/12/2012   Procedure: SHOULDER ARTHROSCOPY WITH ROTATOR CUFF REPAIR AND SUBACROMIAL DECOMPRESSION;  Surgeon: Nita Sells, MD;  Location: [facility];  Service: Orthopedics;  Laterality: Right;  see above and biceps tendonotomy  . TRANSURETHRAL  RESECTION OF BLADDER NECK N/A 04/08/2018   Procedure: TRANSURETHRAL RESECTION OF BLADDER NECK CONTRACTURE;  Surgeon: Franchot Gallo, MD;  Location: [facility];  Service: Urology;  Laterality: N/A;  . TRANSURETHRAL RESECTION OF PROSTATE  11-29-2009   BPH  . VASECTOMY  1970'S   GEN. ANES.     Current Outpatient Medications  Medication Sig Dispense Refill  . albuterol (PROAIR HFA) 108 (90 Base) MCG/ACT inhaler Inhale 2 puffs into the lungs every 6 (six) hours as needed for wheezing or shortness of breath. 18 g 2  . aspirin 81 MG tablet Take 81 mg by mouth daily.     . B-D 3CC LUER-LOK SYR 18GX1-1/2 18G X 1-1/2" 3 ML MISC USE ONCE A WEEK AS DIRECTED  2  . latanoprost (XALATAN) 0.005 % ophthalmic solution Place 1 drop into the right eye at bedtime. (Patient taking differently: Place 1 drop into the right eye at bedtime. ) 2.5 mL 12  . lisinopril (PRINIVIL,ZESTRIL) 20 MG tablet TAKE 1 TABLET BY MOUTH EVERY DAY 90 tablet 1  . metoprolol tartrate (LOPRESSOR) 50 MG tablet Take 1 tablet two hours prior to Cardiac CT 1 tablet 0  . psyllium (METAMUCIL) 58.6 % powder Take 1 packet by mouth daily.    Marland Kitchen testosterone cypionate (DEPOTESTOSTERONE CYPIONATE) 200 MG/ML injection Inject 0.5 mLs (100 mg total) into the muscle every 7 (seven) days. 10 mL 0  . fenofibrate micronized (LOFIBRA) 67 MG capsule Take 1 capsule (67 mg total) by mouth daily before breakfast. 90 capsule 3  . rosuvastatin (CRESTOR) 5 MG tablet Take 1 tablet (5 mg total) by mouth daily. 90 tablet 3   No current facility-administered medications for this visit.     Allergies:   Paroxetine; Morphine and related; Ciprofloxacin; and Flagyl [metronidazole hcl]    Social History:  The patient  reports that he quit smoking about 51 years ago. His smoking use included cigarettes. He quit after 8.00 years of use. He has never used smokeless tobacco. He reports current alcohol use. He reports that he does not use drugs.   Family  History:  The patient's family history includes Cancer in his father and mother; Heart disease in his father; Squamous cell carcinoma in his mother.    ROS:  Please see the history of present illness.   Otherwise, review of systems are positive for visual problems.   All other systems are reviewed and negative.    PHYSICAL EXAM: VS:  BP (!) 150/88   Pulse 97   Ht 5' 10.5" (1.791 m)   Wt 217 lb 12.8 oz (98.8 kg)   SpO2 97%   BMI 30.81 kg/m  , BMI Body mass index is 30.81 kg/m. GEN: Well nourished, well developed, in no acute distress  HEENT: normal  Neck: no JVD, carotid  bruits, or masses Cardiac: RRR; no murmurs, rubs, or gallops,no edema  Respiratory:  clear to auscultation bilaterally, normal work of breathing GI: soft, nontender, nondistended, + BS MS: no deformity or atrophy  Skin: warm and dry, no rash Neuro:  Strength and sensation are intact Psych: euthymic mood, full affect     Recent Labs: 04/08/2018: Hemoglobin 15.0 04/14/2018: ALT 10 07/19/2018: BUN 18; Creatinine, Ser 1.30; Potassium 5.0; Sodium 139   Lipid Panel    Component Value Date/Time   CHOL 159 04/14/2018 0854   TRIG 94.0 04/14/2018 0854   HDL 35.20 (L) 04/14/2018 0854   CHOLHDL 5 04/14/2018 0854   VLDL 18.8 04/14/2018 0854   LDLCALC 105 (H) 04/14/2018 0854   LDLDIRECT 107.6 12/14/2011 0954     Other studies Reviewed: Additional studies/ records that were reviewed today with results demonstrating: Coronary CT result reviewed, labs reviewed.   ASSESSMENT AND PLAN:  1. CAD: 75th percentile by calcium score on his CT.  Would start statin based on this finding.  Stop Gemfibrozil, and start low dose fenofibrate and Crestor.  If he has any anginal symptoms, would have a low threshold for cardiac cath.  I reviewed the CT result which showed negative FFR in all 3 coronary distributions.  There was mention of significant disease in the PDA angiographically.  Given his lack of typical cardiac symptoms,  would manage this medically.  Would only change the strategy if symptoms increased. 2. Obesity: He does weight watchers.  Try to gradually increase exercise.   3. Hyperlipidemia: TG was over 1000 in the past.  He has been on Gemfibrozil.  TG have come down with weight loss. 4. He has several eye related issues that may require surgery. 5. Follow BP at home.  Should improve with more exercise.   Current medicines are reviewed at length with the patient today.  The patient concerns regarding his medicines were addressed.  The following changes have been made:  Lipid meds changed.  Labs/ tests ordered today include:   Orders Placed This Encounter  Procedures  . Lipid panel  . Hepatic function panel    Recommend 150 minutes/week of aerobic exercise Low fat, low carb, high fiber diet recommended  Disposition:   FU in 6 months.    SignedLarae Grooms, MD  09/14/2018 11:12 AM    [facility], [place], [place]  [address] Phone: [phone]; Fax: [phone]

## 2018-09-14 NOTE — Patient Instructions (Addendum)
Medication Instructions:  Your physician has recommended you make the following change in your medication:   1. STOP: gemfibrozil (lopid)  2. START: fenofibrate 67 mg by mouth once a day  3. START: rosuvastatin (crestor) 5 mg by mouth once a day  If you need a refill on your cardiac medications before your next appointment, please call your pharmacy.   Lab work: Your physician recommends that you return for a FASTING lipid profile and liver function panel in 3 months   If you have labs (blood work) drawn today and your tests are completely normal, you will receive your results only by: Marland Kitchen MyChart Message (if you have MyChart) OR . A paper copy in the mail If you have any lab test that is abnormal or we need to change your treatment, we will call you to review the results.  Testing/Procedures: None ordered  Follow-Up: At [facility], you and your health needs are our priority.  As part of our continuing mission to provide you with exceptional heart care, we have created designated Provider Care Teams.  These Care Teams include your primary Cardiologist (physician) and Advanced Practice Providers (APPs -  Physician Assistants and Nurse Practitioners) who all work together to provide you with the care you need, when you need it. . You will need a follow up appointment in 6 months.  Please call our office 2 months in advance to schedule this appointment.  You may see Casandra Doffing, MD or one of the following Advanced Practice Providers on your designated Care Team:   . Lyda Jester, PA-C . Dayna Dunn, PA-C . Ermalinda Barrios, PA-C  Any Other Special Instructions Will Be Listed Below (If Applicable).

## 2018-09-15 ENCOUNTER — Ambulatory Visit: Payer: Medicare HMO | Attending: Neurology

## 2018-09-15 DIAGNOSIS — G4733 Obstructive sleep apnea (adult) (pediatric): Secondary | ICD-10-CM | POA: Diagnosis not present

## 2018-09-22 DIAGNOSIS — G4733 Obstructive sleep apnea (adult) (pediatric): Secondary | ICD-10-CM | POA: Diagnosis not present

## 2018-09-26 DIAGNOSIS — H2513 Age-related nuclear cataract, bilateral: Secondary | ICD-10-CM | POA: Diagnosis not present

## 2018-10-07 ENCOUNTER — Telehealth: Payer: Self-pay | Admitting: Family Medicine

## 2018-10-07 DIAGNOSIS — I10 Essential (primary) hypertension: Secondary | ICD-10-CM | POA: Diagnosis not present

## 2018-10-07 DIAGNOSIS — G4733 Obstructive sleep apnea (adult) (pediatric): Secondary | ICD-10-CM | POA: Diagnosis not present

## 2018-10-07 NOTE — Telephone Encounter (Signed)
Results should go to ENT.

## 2018-10-07 NOTE — Telephone Encounter (Signed)
Mr. Gilmer notified as instructed by telephone.

## 2018-10-07 NOTE — Telephone Encounter (Signed)
Pt is getting a cpap machine and want to know if the results can come here to Dr Diona Browner. The ENT gave him the referral.

## 2018-10-25 ENCOUNTER — Telehealth: Payer: Self-pay | Admitting: *Deleted

## 2018-10-25 DIAGNOSIS — R918 Other nonspecific abnormal finding of lung field: Secondary | ICD-10-CM

## 2018-10-25 NOTE — Telephone Encounter (Signed)
-----   Message from Jinny Sanders, MD sent at 10/25/2018  8:33 AM EST -----  Call pt... he is due for his CT chest repeat to follow up on bilateral pulmonary nodules. ( seen on cardiac CT). let me know if he is agreeable to setting this up in this month.  Eliezer Lofts, MD [place] [facility] ----- Message ----- From: Jinny Sanders, MD Sent: 10/24/2018 To: Jinny Sanders, MD  Set up CT noncontrast in 3-11/2018   Cardiac CT incidental finding: Bilateral pulmonary nodules measuring up to 6 mm. Non-contrast chest CT at 3-6 months is recommended. If the nodules are stable at time of repeat CT, then future CT at 18-24 months (from today's scan) is considered optional for low-risk patients, but is recommended for high-risk patients.

## 2018-10-25 NOTE — Telephone Encounter (Signed)
Left message for Jack Norris to return my call. 

## 2018-10-26 ENCOUNTER — Encounter: Payer: Self-pay | Admitting: *Deleted

## 2018-10-26 NOTE — Telephone Encounter (Signed)
Left message for [name] to return my call. 

## 2018-10-28 NOTE — Telephone Encounter (Signed)
Noted and reasonable.

## 2018-10-28 NOTE — Telephone Encounter (Signed)
Pt returning your call. Please call pt. °

## 2018-10-28 NOTE — Telephone Encounter (Signed)
Spoke with Jack Norris.  He has 2 eye surgeries scheduled this month.  He would like to call us back in April to set up the follow up CT once he is over his surgeries.  He also wanted to let Dr. Diona Browner know he is now on a CPAP machine.  This was started in January.  Jack Norris will call me back directly to let us know when he is ready to schedule the CT.

## 2018-10-31 DIAGNOSIS — H2512 Age-related nuclear cataract, left eye: Secondary | ICD-10-CM | POA: Diagnosis not present

## 2018-10-31 DIAGNOSIS — H52212 Irregular astigmatism, left eye: Secondary | ICD-10-CM | POA: Diagnosis not present

## 2018-10-31 DIAGNOSIS — H21562 Pupillary abnormality, left eye: Secondary | ICD-10-CM | POA: Diagnosis not present

## 2018-11-02 DIAGNOSIS — H6123 Impacted cerumen, bilateral: Secondary | ICD-10-CM | POA: Diagnosis not present

## 2018-11-02 DIAGNOSIS — G4733 Obstructive sleep apnea (adult) (pediatric): Secondary | ICD-10-CM | POA: Diagnosis not present

## 2018-11-05 DIAGNOSIS — G4733 Obstructive sleep apnea (adult) (pediatric): Secondary | ICD-10-CM | POA: Diagnosis not present

## 2018-11-07 DIAGNOSIS — H43812 Vitreous degeneration, left eye: Secondary | ICD-10-CM | POA: Diagnosis not present

## 2018-11-07 DIAGNOSIS — H35373 Puckering of macula, bilateral: Secondary | ICD-10-CM | POA: Diagnosis not present

## 2018-11-07 DIAGNOSIS — H43813 Vitreous degeneration, bilateral: Secondary | ICD-10-CM | POA: Diagnosis not present

## 2018-11-07 NOTE — Telephone Encounter (Signed)
Spoke with Jack Norris.  He is ready to schedule his repeat CT.  He had a trip planned for this week but it got cancelled so he is available this week if we can get that scheduled for him.

## 2018-11-07 NOTE — Telephone Encounter (Signed)
Pt returning call to nurse to sched follow up CT. Please call pt.

## 2018-11-08 DIAGNOSIS — G4733 Obstructive sleep apnea (adult) (pediatric): Secondary | ICD-10-CM | POA: Diagnosis not present

## 2018-11-08 NOTE — Telephone Encounter (Signed)
Placed order for CT.

## 2018-11-08 NOTE — Addendum Note (Signed)
Addended byEliezer Lofts E on: 11/08/2018 05:04 PM   Modules accepted: Orders

## 2018-11-09 NOTE — Telephone Encounter (Signed)
Appt scheduled and patient aware.  

## 2018-12-06 DIAGNOSIS — G4733 Obstructive sleep apnea (adult) (pediatric): Secondary | ICD-10-CM | POA: Diagnosis not present

## 2018-12-08 DIAGNOSIS — G4733 Obstructive sleep apnea (adult) (pediatric): Secondary | ICD-10-CM | POA: Diagnosis not present

## 2018-12-12 ENCOUNTER — Other Ambulatory Visit: Payer: Self-pay

## 2018-12-12 ENCOUNTER — Other Ambulatory Visit: Payer: Medicare HMO | Admitting: *Deleted

## 2018-12-12 DIAGNOSIS — E7800 Pure hypercholesterolemia, unspecified: Secondary | ICD-10-CM | POA: Diagnosis not present

## 2018-12-12 DIAGNOSIS — I2510 Atherosclerotic heart disease of native coronary artery without angina pectoris: Secondary | ICD-10-CM

## 2018-12-12 LAB — LIPID PANEL
Chol/HDL Ratio: 2.9 ratio (ref 0.0–5.0)
Cholesterol, Total: 126 mg/dL (ref 100–199)
HDL: 44 mg/dL (ref >39)
LDL Calculated: 67 mg/dL (ref 0–99)
Triglycerides: 73 mg/dL (ref 0–149)
VLDL Cholesterol Cal: 15 mg/dL (ref 5–40)

## 2018-12-12 LAB — HEPATIC FUNCTION PANEL
ALT: 33 IU/L (ref 0–44)
AST: 26 IU/L (ref 0–40)
Albumin: 4.4 g/dL (ref 3.7–4.7)
Alkaline Phosphatase: 122 IU/L — ABNORMAL HIGH (ref 39–117)
Bilirubin Total: 0.7 mg/dL (ref 0.0–1.2)
Bilirubin, Direct: 0.24 mg/dL (ref 0.00–0.40)
Total Protein: 6.2 g/dL (ref 6.0–8.5)

## 2018-12-19 ENCOUNTER — Other Ambulatory Visit: Payer: Self-pay

## 2018-12-19 ENCOUNTER — Ambulatory Visit (INDEPENDENT_AMBULATORY_CARE_PROVIDER_SITE_OTHER)
Admission: RE | Admit: 2018-12-19 | Discharge: 2018-12-19 | Disposition: A | Discharge status: Home / Self Care | Payer: Medicare HMO | Source: Ambulatory Visit | Attending: Family Medicine | Admitting: Family Medicine

## 2018-12-19 DIAGNOSIS — R918 Other nonspecific abnormal finding of lung field: Secondary | ICD-10-CM

## 2018-12-20 ENCOUNTER — Encounter: Payer: Self-pay | Admitting: Family Medicine

## 2018-12-20 DIAGNOSIS — R911 Solitary pulmonary nodule: Secondary | ICD-10-CM | POA: Insufficient documentation

## 2018-12-20 DIAGNOSIS — R918 Other nonspecific abnormal finding of lung field: Secondary | ICD-10-CM | POA: Insufficient documentation

## 2018-12-22 ENCOUNTER — Inpatient Hospital Stay: Admission: RE | Admit: 2018-12-22 | Payer: Medicare HMO | Source: Ambulatory Visit

## 2019-01-04 ENCOUNTER — Other Ambulatory Visit: Payer: Self-pay | Admitting: Family Medicine

## 2019-01-05 DIAGNOSIS — G4733 Obstructive sleep apnea (adult) (pediatric): Secondary | ICD-10-CM | POA: Diagnosis not present

## 2019-01-10 DIAGNOSIS — I10 Essential (primary) hypertension: Secondary | ICD-10-CM | POA: Diagnosis not present

## 2019-01-10 DIAGNOSIS — G4733 Obstructive sleep apnea (adult) (pediatric): Secondary | ICD-10-CM | POA: Diagnosis not present

## 2019-01-11 DIAGNOSIS — H35372 Puckering of macula, left eye: Secondary | ICD-10-CM | POA: Diagnosis not present

## 2019-01-18 DIAGNOSIS — L578 Other skin changes due to chronic exposure to nonionizing radiation: Secondary | ICD-10-CM | POA: Diagnosis not present

## 2019-01-18 DIAGNOSIS — L570 Actinic keratosis: Secondary | ICD-10-CM | POA: Diagnosis not present

## 2019-01-18 DIAGNOSIS — Z86018 Personal history of other benign neoplasm: Secondary | ICD-10-CM | POA: Diagnosis not present

## 2019-01-18 DIAGNOSIS — B351 Tinea unguium: Secondary | ICD-10-CM | POA: Diagnosis not present

## 2019-01-19 DIAGNOSIS — H35372 Puckering of macula, left eye: Secondary | ICD-10-CM | POA: Diagnosis not present

## 2019-01-19 DIAGNOSIS — Z09 Encounter for follow-up examination after completed treatment for conditions other than malignant neoplasm: Secondary | ICD-10-CM | POA: Diagnosis not present

## 2019-01-25 DIAGNOSIS — G4733 Obstructive sleep apnea (adult) (pediatric): Secondary | ICD-10-CM | POA: Diagnosis not present

## 2019-02-01 DIAGNOSIS — N401 Enlarged prostate with lower urinary tract symptoms: Secondary | ICD-10-CM | POA: Diagnosis not present

## 2019-02-01 DIAGNOSIS — E291 Testicular hypofunction: Secondary | ICD-10-CM | POA: Diagnosis not present

## 2019-02-01 DIAGNOSIS — R3915 Urgency of urination: Secondary | ICD-10-CM | POA: Diagnosis not present

## 2019-02-01 DIAGNOSIS — Q6439 Other atresia and stenosis of urethra and bladder neck: Secondary | ICD-10-CM | POA: Diagnosis not present

## 2019-02-05 DIAGNOSIS — G4733 Obstructive sleep apnea (adult) (pediatric): Secondary | ICD-10-CM | POA: Diagnosis not present

## 2019-03-07 DIAGNOSIS — G4733 Obstructive sleep apnea (adult) (pediatric): Secondary | ICD-10-CM | POA: Diagnosis not present

## 2019-03-09 ENCOUNTER — Other Ambulatory Visit: Payer: Self-pay

## 2019-03-09 ENCOUNTER — Ambulatory Visit (INDEPENDENT_AMBULATORY_CARE_PROVIDER_SITE_OTHER): Payer: Medicare HMO | Admitting: Family Medicine

## 2019-03-09 ENCOUNTER — Encounter: Payer: Self-pay | Admitting: Family Medicine

## 2019-03-09 VITALS — BP 150/76 | HR 76 | Temp 98.2°F | Resp 18 | Ht 70.5 in | Wt 216.5 lb

## 2019-03-09 DIAGNOSIS — H35372 Puckering of macula, left eye: Secondary | ICD-10-CM | POA: Diagnosis not present

## 2019-03-09 DIAGNOSIS — W57XXXA Bitten or stung by nonvenomous insect and other nonvenomous arthropods, initial encounter: Secondary | ICD-10-CM | POA: Diagnosis not present

## 2019-03-09 DIAGNOSIS — S1096XA Insect bite of unspecified part of neck, initial encounter: Secondary | ICD-10-CM

## 2019-03-09 DIAGNOSIS — Z09 Encounter for follow-up examination after completed treatment for conditions other than malignant neoplasm: Secondary | ICD-10-CM | POA: Diagnosis not present

## 2019-03-09 MED ORDER — DOXYCYCLINE HYCLATE 100 MG PO TABS: 200.0000 mg | ORAL_TABLET | Freq: Once | ORAL | 0 refills | Status: AC

## 2019-03-09 NOTE — Patient Instructions (Signed)
#  Take the antibiotic - wear sunblock as it may make your skin sensitive to sun  Watch for fever, chills, body aches, new rashes  For the tick bite - can try ice, warm compress, or alcohol if it bothers you - the tick should come out on its own

## 2019-03-09 NOTE — Progress Notes (Signed)
   Subjective:     YUVRAJ PFEIFER is a 78 y.o. male presenting for Insect Bite (noticed a tick on the back of his neck last night and could not get it all out)     HPI   #Tick bite - found last night - possible that it has been there a few days - [place] trail 7[place] may have been when he was  - tried to remove it at home with tweezers and wife could not fully remove - wife tried a needle to pull the remaining amount out - not itchy or irritating - not clear if engorged   Review of Systems  Constitutional: Negative for chills and fever.  Skin: Negative for rash.     Social History   Tobacco Use  Smoking Status Former Smoker  . Years: 8.00  . Types: Cigarettes  . Quit date: 01/05/1967  . Years since quitting: 52.2  Smokeless Tobacco Never Used  Tobacco Comment   QUIT AGE 67        Objective:    BP Readings from Last 3 Encounters:  03/09/19 (!) 150/76  09/14/18 (!) 150/88  07/28/18 105/61   Wt Readings from Last 3 Encounters:  03/09/19 216 lb 8 oz (98.2 kg)  09/14/18 217 lb 12.8 oz (98.8 kg)  06/16/18 211 lb 6.4 oz (95.9 kg)    BP (!) 150/76   Pulse 76   Temp 98.2 F (36.8 C)   Resp 18   Ht 5' 10.5" (1.791 m)   Wt 216 lb 8 oz (98.2 kg)   BMI 30.63 kg/m    Physical Exam Constitutional:      Appearance: Normal appearance. He is not ill-appearing or diaphoretic.  HENT:     Right Ear: External ear normal.     Left Ear: External ear normal.     Nose: Nose normal.  Eyes:     General: No scleral icterus.    Extraocular Movements: Extraocular movements intact.     Conjunctiva/sclera: Conjunctivae normal.  Neck:     Musculoskeletal: Neck supple.  Cardiovascular:     Rate and Rhythm: Normal rate.  Pulmonary:     Effort: Pulmonary effort is normal.  Skin:    General: Skin is warm and dry.     Comments: Left side of neck: small ulcer lesion with central dark portion and surrounding dry blood.   Neurological:     Mental Status: He is  alert. Mental status is at baseline.  Psychiatric:        Mood and Affect: Mood normal.           Assessment & Plan:   Problem List Items Addressed This Visit    None    Visit Diagnoses    Tick bite of neck, initial encounter    -  Primary   Relevant Medications   doxycycline (VIBRA-TABS) 100 MG tablet     Discussed that the remaining tick portion should leave on its own.  Given duration of attachment unknown but likely 36 hours  And less than 72 hours since removal is a candidate for prophylaxis so will treat.   Return precautions discussed  Return if symptoms worsen or fail to improve.  Lesleigh Noe, MD

## 2019-03-15 ENCOUNTER — Encounter: Payer: Self-pay | Admitting: Cardiology

## 2019-03-15 ENCOUNTER — Telehealth: Payer: Self-pay

## 2019-03-15 ENCOUNTER — Ambulatory Visit: Payer: Medicare HMO | Admitting: Cardiology

## 2019-03-15 ENCOUNTER — Ambulatory Visit (INDEPENDENT_AMBULATORY_CARE_PROVIDER_SITE_OTHER): Payer: Medicare HMO | Admitting: Cardiology

## 2019-03-15 ENCOUNTER — Other Ambulatory Visit: Payer: Self-pay

## 2019-03-15 VITALS — BP 140/80 | HR 78 | Ht 70.5 in | Wt 217.6 lb

## 2019-03-15 DIAGNOSIS — I2510 Atherosclerotic heart disease of native coronary artery without angina pectoris: Secondary | ICD-10-CM | POA: Diagnosis not present

## 2019-03-15 DIAGNOSIS — I10 Essential (primary) hypertension: Secondary | ICD-10-CM

## 2019-03-15 DIAGNOSIS — E782 Mixed hyperlipidemia: Secondary | ICD-10-CM

## 2019-03-15 NOTE — Telephone Encounter (Signed)

## 2019-03-15 NOTE — Patient Instructions (Signed)
Medication Instructions:  none If you need a refill on your cardiac medications before your next appointment, please call your pharmacy.   Lab work: none If you have labs (blood work) drawn today and your tests are completely normal, you will receive your results only by: Marland Kitchen MyChart Message (if you have MyChart) OR . A paper copy in the mail If you have any lab test that is abnormal or we need to change your treatment, we will call you to review the results.  Testing/Procedures: none  Follow-Up: At [facility], you and your health needs are our priority.  As part of our continuing mission to provide you with exceptional heart care, we have created designated Provider Care Teams.  These Care Teams include your primary Cardiologist (physician) and Advanced Practice Providers (APPs -  Physician Assistants and Nurse Practitioners) who all work together to provide you with the care you need, when you need it. You will need a follow up appointment in 6 months.  Please call our office 2 months in advance to schedule this appointment.  You may see Dr Irish Lack or one of the following Advanced Practice Providers on your designated Care Team:   Lyda Jester, PA-C Melina Copa, PA-C . Ermalinda Barrios, PA-C  Any Other Special Instructions Will Be Listed Below (If Applicable). CHECK BP regularly, if consistently above 140/80, call our office [phone]

## 2019-03-15 NOTE — Progress Notes (Signed)
03/15/2019 Jack Norris   10-08-1940  [id number]  Primary Physician Jack Sanders, MD Primary Cardiologist: No primary care provider on file.  Electrophysiologist: None   Reason for Visit/CC: f/u for CAD   HPI:  Jack Norris is a 78 y.o. male, followed by Jack Norris, with a h/o CAD by coronary CTA, family h/o heart disease, HTN and HLD, presenting to clinic for 6 month f/u.  In late 2019, he had some chest discomfort while at the dentist.   He underwent CT Angie of the coronary arteries.  FFR in all 3 coronary distributions was essentially negative, with the exception of the very distal LAD.  However, no stenosis was identified by angiography.  There was suspicion of a PDA lesion of significance, but not mention to be significant by FFR.  At that point, we told him to watch for symptoms.  Any further symptoms would have [place] perform. His last OV with Jack Norris was in January. He was doing well at that time w/o cardiac symptoms but changes were made to his lipid regimen given coronary CT findings. Gemfibrozil was discontinued and fenofibrate and Crestor added. Increasing physical activity and weight loss also recommended. He was instructed to f/u again in 6 months. He had f/u FLP done in April. LDL was at goal at 67 and TG also controlled at 73.   Today in f/u, he reports that he has done well. No CP or dyspnea. Compliant w/ medications tolerating well. No side effects w/ statin. Walks for exercise. No exertional symptoms or limitations. Just had cataract surgery 6 weeks ago. Tolerated well. Getting ready to travel to [place] to spend a week with family.   Cardiac Studies   Coronary CTA 07/2018  IMPRESSION: 1. Coronary artery calcium score 976 Agatston units, placing the patient in the 75th percentile for age and gender, suggesting intermediate risk for future cardiac events.  2. Suspect severe (70-90%) stenosis in the mid PDA, this is a moderate-sized vessel.  3.   Extensive LAD plaque, suspect up to 50% mid LAD stenosis.  Will send for FFR to assess hemodynamic significance of disease.  FINDINGS: No drop in FFR in the RCA system.  FFR 0.87 mid LAD  FFR < 0.8 at the distal LAD but no discrete stenosis.  IMPRESSION: 1. No evidence for hemodynamically significant stenosis in the RCA system.  2. FFR < 0.8 in the distal LAD but no evidence for a discrete stenosis and FFR 0.87 in the mid LAD after a known area of stenosis. Difficult to know what to make of distal LAD finding.   Current Meds  Medication Sig  . albuterol (PROAIR HFA) 108 (90 Base) MCG/ACT inhaler Inhale 2 puffs into the lungs every 6 (six) hours as needed for wheezing or shortness of breath.  Marland Kitchen aspirin 81 MG tablet Take 81 mg by mouth daily.   . B-D 3CC LUER-LOK SYR 18GX1-1/2 18G X 1-1/2" 3 ML MISC USE ONCE A WEEK AS DIRECTED  . fenofibrate micronized (LOFIBRA) 67 MG capsule Take 1 capsule (67 mg total) by mouth daily before breakfast.  . latanoprost (XALATAN) 0.005 % ophthalmic solution Place 1 drop into the right eye at bedtime.  Marland Kitchen lisinopril (ZESTRIL) 10 MG tablet TAKE 2 TABLETS BY MOUTH EVERY DAY  . oxybutynin (DITROPAN) 5 MG tablet Take 5 mg by mouth daily as needed. Overactive bladder  . psyllium (METAMUCIL) 58.6 % powder Take 1 packet by mouth daily.  . rosuvastatin (CRESTOR) 5 MG tablet Take  1 tablet (5 mg total) by mouth daily.  Marland Kitchen testosterone cypionate (DEPOTESTOSTERONE CYPIONATE) 200 MG/ML injection Inject 0.5 mLs (100 mg total) into the muscle every 7 (seven) days.   Allergies  Allergen Reactions  . Paroxetine     REACTION: Had mild personality change on this medication and doesn't want to take this again.  Marland Kitchen Morphine And Related     Causes bladder problems  . Ciprofloxacin Other (See Comments)    ABDOMINAL PAIN/ sob  . Flagyl [Metronidazole Hcl] Other (See Comments)    ABDOMINAL PAIN/ sob   Past Medical History:  Diagnosis Date  . Asthma, mild    as  child-only now if has URI  . BPH (benign prostatic hypertrophy)   . BPPV (benign paroxysmal positional vertigo)   . Diverticulosis   . Eczema   . Fatty liver disease, nonalcoholic   . Frequent nosebleeds    History of  . GERD (gastroesophageal reflux disease)    occ uses TUMS as needed  . Glaucoma   . History of bronchitis   . History of colon polyps   . History of diverticulitis   . History of kidney stones   . Hyperlipemia   . Hypertension   . Influenza A 11/2017  . OA (osteoarthritis)   . Obesity   . PVCs (premature ventricular contractions)   . Renal calculi right    AND LEFT X1 NON-OBSTRUCTIVE  . Right sided sciatica    Bilateral buttock and thighs  . Simple renal cyst right interpoler, simpler in nature , asymptomatic  per urologist note (Jack Norris)  12-26-2011   BENIGN, has been drained  . Testosterone deficiency   . Wears glasses   . Wears hearing aid    Family History  Problem Relation Age of Onset  . Cancer Mother        breast and kidney   . Squamous cell carcinoma Mother   . Cancer Father        lung  . Heart disease Father    Past Surgical History:  Procedure Laterality Date  . APPENDECTOMY    . bone spur removal     BILATERAL GREAT TOE  . COLONOSCOPY    . CYSTO/ RIGHT RETROGRADE PYELOGRAM/ RIGHT URETER DILATATION/ RIGHT URETEROSCOPY/ LASER LITHOTRIPSY RIGHT RENAL PELVIS AND LOWER POLE CALCULI/ PLACEMENT STENT  01-11-2012  Jack Norris   RIGHT RENAL PELVIS AND LOWER POLE CALCULI  . CYSTOSCOPY W/ RETROGRADES  06/02/2012   Procedure: CYSTOSCOPY WITH RETROGRADE PYELOGRAM;  Surgeon: Jack Gallo, MD;  Location: [facility];  Service: Urology;  Laterality: Right;  . CYSTOSCOPY WITH URETEROSCOPY  06/02/2012   Procedure: CYSTOSCOPY WITH URETEROSCOPY;  Surgeon: Jack Gallo, MD;  [facility];  Service: Urology;  Laterality: Right;  90 MIN ALSO EXTRACTION OF CALCULI   . LITHOTRIPSY  APPROX.  1998   ESWL   left kidney x 2  . ROTATOR CUFF REPAIR  2012   LEFT SHOULDER  . SHOULDER ARTHROSCOPY WITH ROTATOR CUFF REPAIR AND SUBACROMIAL DECOMPRESSION  09/12/2012   Procedure: SHOULDER ARTHROSCOPY WITH ROTATOR CUFF REPAIR AND SUBACROMIAL DECOMPRESSION;  Surgeon: Nita Sells, MD;  Location: [place];  Service: Orthopedics;  Laterality: Right;  see above and biceps tendonotomy  . TRANSURETHRAL RESECTION OF BLADDER NECK N/A 04/08/2018   Procedure: TRANSURETHRAL RESECTION OF BLADDER NECK CONTRACTURE;  Surgeon: Jack Gallo, MD;  Location: [facility];  Service: Urology;  Laterality: N/A;  . TRANSURETHRAL RESECTION OF PROSTATE  11-29-2009   BPH  . VASECTOMY  1970'S   GEN. ANES.   Social History   Socioeconomic History  . Marital status: Married    Spouse name: Not on file  . Number of children: 3  . Years of education: Not on file  . Highest education level: Not on file  Occupational History  . Occupation: Industrial/product designer: [employer]  . Financial resource strain: Not on file  . Food insecurity    Worry: Not on file    Inability: Not on file  . Transportation needs    Medical: Not on file    Non-medical: Not on file  Tobacco Use  . Smoking status: Former Smoker    Years: 8.00    Types: Cigarettes    Quit date: 01/05/1967    Years since quitting: 52.2  . Smokeless tobacco: Never Used  . Tobacco comment: QUIT AGE 44  Substance and Sexual Activity  . Alcohol use: Yes    Alcohol/week: 0.0 standard drinks    Comment: OCCASIONAL  . Drug use: No  . Sexual activity: Yes  Lifestyle  . Physical activity    Days per week: Not on file    Minutes per session: Not on file  . Stress: Not on file  Relationships  . Social Herbalist on phone: Not on file    Gets together: Not on file    Attends religious service: Not on file    Active member of club or organization: Not on file    Attends meetings of  clubs or organizations: Not on file    Relationship status: Not on file  . Intimate partner violence    Fear of current or ex partner: Not on file    Emotionally abused: Not on file    Physically abused: Not on file    Forced sexual activity: Not on file  Other Topics Concern  . Not on file  Social History Narrative   regular exercise    Full code, (Reviewed 2014)     Lipid Panel     Component Value Date/Time   CHOL 126 12/12/2018 0810   TRIG 73 12/12/2018 0810   HDL 44 12/12/2018 0810   CHOLHDL 2.9 12/12/2018 0810   CHOLHDL 5 04/14/2018 0854   VLDL 18.8 04/14/2018 0854   LDLCALC 67 12/12/2018 0810   LDLDIRECT 107.6 12/14/2011 0954    Review of Systems: General: negative for chills, fever, night sweats or weight changes.  Cardiovascular: negative for chest pain, dyspnea on exertion, edema, orthopnea, palpitations, paroxysmal nocturnal dyspnea or shortness of breath Dermatological: negative for rash Respiratory: negative for cough or wheezing Urologic: negative for hematuria Abdominal: negative for nausea, vomiting, diarrhea, bright red blood per rectum, melena, or hematemesis Neurologic: negative for visual changes, syncope, or dizziness All other systems reviewed and are otherwise negative except as noted above.   Physical Exam:  Blood pressure 140/80, pulse 78, height 5' 10.5" (1.791 m), weight 217 lb 9.6 oz (98.7 kg), SpO2 98 %.  General appearance: alert, cooperative and no distress Neck: no carotid bruit and no JVD Lungs: clear to auscultation bilaterally Heart: regular rate and rhythm, S1, S2 normal, no murmur, click, rub or gallop Abdomen: soft, non-tender; bowel sounds normal; no masses,  no organomegaly Pulses: 2+ and symmetric Skin: Skin color, texture, turgor normal. No rashes or lesions Neurologic: Grossly normal  EKG not performed today -- personally reviewed   ASSESSMENT AND PLAN:  1. CAD: stable w/o anginal symptoms. No CP or dyspnea. Walks for  exercise w/o limitation. Continue ASA and statin.   2. HLD: controlled on current regimen. Recent lipid panel 11/2018 showed LDL to be at recommended goal of < 70 at 67 mg/dL. TG also controlled at 73. HDL good at 44. Continue Crestor and fenofibrate.   3. HTN: BP is 140/80 today. He is on 20 mg of lisinopril. Reports full compliance. He just ate a roast beef sandwich w/ cheese and pickles. He will monitor his BP over the next week and will f/u in 1 week with a phone call. If persistently elevated BP >140/80, will then advise increasing lisinopril to 40 mg and f/u BMP in 1 week after increase. I also encouraged that he increase physical activity. He has gained ~5 lb since covid quarantine.    Follow-Up in 6 months w/ Jack Norris.   Teriah Muela Ladoris Gene, MHS [facility] 03/15/2019 3:09 PM

## 2019-04-03 DIAGNOSIS — L570 Actinic keratosis: Secondary | ICD-10-CM | POA: Diagnosis not present

## 2019-04-03 DIAGNOSIS — H2511 Age-related nuclear cataract, right eye: Secondary | ICD-10-CM | POA: Diagnosis not present

## 2019-04-07 DIAGNOSIS — G4733 Obstructive sleep apnea (adult) (pediatric): Secondary | ICD-10-CM | POA: Diagnosis not present

## 2019-04-10 DIAGNOSIS — H2181 Floppy iris syndrome: Secondary | ICD-10-CM | POA: Diagnosis not present

## 2019-04-10 DIAGNOSIS — H5703 Miosis: Secondary | ICD-10-CM | POA: Diagnosis not present

## 2019-04-10 DIAGNOSIS — H2511 Age-related nuclear cataract, right eye: Secondary | ICD-10-CM | POA: Diagnosis not present

## 2019-04-19 ENCOUNTER — Telehealth: Payer: Self-pay | Admitting: Cardiology

## 2019-04-19 DIAGNOSIS — Z79899 Other long term (current) drug therapy: Secondary | ICD-10-CM

## 2019-04-19 DIAGNOSIS — I10 Essential (primary) hypertension: Secondary | ICD-10-CM

## 2019-04-19 MED ORDER — LISINOPRIL 40 MG PO TABS: 40.0000 mg | ORAL_TABLET | Freq: Every day | ORAL | 1 refills | Status: DC

## 2019-04-19 NOTE — Telephone Encounter (Signed)
°  Patient is on lisinopril (ZESTRIL) 10 MG tablet, he has put on a little bit on weight. His BP currently is running around 145/80 late mornings, he wants to know if [place] want to keep him on the lisinopril 10mg  or increase it. He would also like to change his pharmacy.

## 2019-04-19 NOTE — Telephone Encounter (Signed)
Spoke with the pt and informed him that Dr. Irish Lack reviewed his message and recommends that it is ok to increase his lisinopril to 40 mg po daily, and recheck a bmet in 1-2 weeks after change.  Pt requesting a 90 day supply to be sent into his confirmed pharmacy of choice.  Scheduled the pt for a repeat BMET in 2 weeks on 05/03/19 at 1130.  Pt verbalized understanding and agrees with this plan.

## 2019-04-19 NOTE — Telephone Encounter (Signed)
Left a message with the pts wife to call the office back, and request to speak with any triage nurse, for further assistance with call placed to the office earlier.

## 2019-04-19 NOTE — Telephone Encounter (Signed)
OK to increase lisinopril to 40 mg daily.  BMet in 1-2 weeks after change.

## 2019-04-19 NOTE — Telephone Encounter (Signed)
Pt is calling in to report to [place] PA-C and Dr. Irish Lack that he has gained weight over the past few months and has now noticed his BP readings have increased, and sometimes he gets mild headaches with this.  Pt states he takes Lisinopril 20 mg po daily, and feels as if this medication needs to be increased, or switched to a different regimen.  Pt states he has no other complaints at all, and generally feels most of the time.  Pt states his average BP is ranging in the 0000000 systolic and [id number] diastolic.  Last BP taken this morning was 145/80. Pt states he is working on his diet, and he has eliminated adding table salt to his food.  Pt also requested that we change his new pharmacy to [facility], and this was done. Informed the pt that I will route this message to both Ellen Henri PA-C and Dr. Irish Lack for further review and recommendation.  Informed the pt that he will receive a call back thereafter, once recommendations received.  Pt verbalized understanding and agrees with this plan.

## 2019-04-24 DIAGNOSIS — H903 Sensorineural hearing loss, bilateral: Secondary | ICD-10-CM | POA: Diagnosis not present

## 2019-04-24 DIAGNOSIS — H6123 Impacted cerumen, bilateral: Secondary | ICD-10-CM | POA: Diagnosis not present

## 2019-04-28 ENCOUNTER — Other Ambulatory Visit: Payer: Medicare HMO

## 2019-05-03 ENCOUNTER — Other Ambulatory Visit: Payer: Medicare HMO | Admitting: *Deleted

## 2019-05-03 ENCOUNTER — Other Ambulatory Visit: Payer: Self-pay

## 2019-05-03 DIAGNOSIS — Z79899 Other long term (current) drug therapy: Secondary | ICD-10-CM

## 2019-05-03 DIAGNOSIS — I10 Essential (primary) hypertension: Secondary | ICD-10-CM | POA: Diagnosis not present

## 2019-05-03 LAB — BASIC METABOLIC PANEL
BUN/Creatinine Ratio: 12 (ref 10–24)
BUN: 16 mg/dL (ref 8–27)
CO2: 22 mmol/L (ref 20–29)
Calcium: 9.6 mg/dL (ref 8.6–10.2)
Chloride: 106 mmol/L (ref 96–106)
Creatinine, Ser: 1.35 mg/dL — ABNORMAL HIGH (ref 0.76–1.27)
GFR calc Af Amer: 58 mL/min/{1.73_m2} — ABNORMAL LOW (ref >59)
GFR calc non Af Amer: 50 mL/min/{1.73_m2} — ABNORMAL LOW (ref >59)
Glucose: 110 mg/dL — ABNORMAL HIGH (ref 65–99)
Potassium: 4.4 mmol/L (ref 3.5–5.2)
Sodium: 141 mmol/L (ref 134–144)

## 2019-05-04 DIAGNOSIS — G4733 Obstructive sleep apnea (adult) (pediatric): Secondary | ICD-10-CM | POA: Diagnosis not present

## 2019-05-05 ENCOUNTER — Encounter: Payer: Medicare HMO | Admitting: Family Medicine

## 2019-05-05 ENCOUNTER — Ambulatory Visit: Payer: Medicare HMO

## 2019-05-08 DIAGNOSIS — G4733 Obstructive sleep apnea (adult) (pediatric): Secondary | ICD-10-CM | POA: Diagnosis not present

## 2019-06-07 DIAGNOSIS — G4733 Obstructive sleep apnea (adult) (pediatric): Secondary | ICD-10-CM | POA: Diagnosis not present

## 2019-06-08 ENCOUNTER — Telehealth: Payer: Self-pay | Admitting: Family Medicine

## 2019-06-08 DIAGNOSIS — E7800 Pure hypercholesterolemia, unspecified: Secondary | ICD-10-CM

## 2019-06-08 NOTE — Telephone Encounter (Signed)
-----   Message from Cloyd Stagers, RT sent at 05/29/2019  2:22 PM EDT ----- Regarding: Lab Orders for Friday 10.16.2020 Please place lab orders for Friday 10.16.2020, office visit for physical on Friday 10.23.2020 Thank you, Dyke Maes RT(R)

## 2019-06-09 ENCOUNTER — Other Ambulatory Visit: Payer: Self-pay

## 2019-06-09 ENCOUNTER — Other Ambulatory Visit (INDEPENDENT_AMBULATORY_CARE_PROVIDER_SITE_OTHER): Payer: Medicare HMO

## 2019-06-09 DIAGNOSIS — E7800 Pure hypercholesterolemia, unspecified: Secondary | ICD-10-CM | POA: Diagnosis not present

## 2019-06-09 LAB — LIPID PANEL
Cholesterol: 145 mg/dL (ref 0–200)
HDL: 50.3 mg/dL (ref >39.00)
LDL Cholesterol: 78 mg/dL (ref 0–99)
NonHDL: 95.05
Total CHOL/HDL Ratio: 3
Triglycerides: 83 mg/dL (ref 0.0–149.0)
VLDL: 16.6 mg/dL (ref 0.0–40.0)

## 2019-06-09 LAB — COMPREHENSIVE METABOLIC PANEL
ALT: 40 U/L (ref 0–53)
AST: 31 U/L (ref 0–37)
Albumin: 4.5 g/dL (ref 3.5–5.2)
Alkaline Phosphatase: 105 U/L (ref 39–117)
BUN: 17 mg/dL (ref 6–23)
CO2: 28 mEq/L (ref 19–32)
Calcium: 9.3 mg/dL (ref 8.4–10.5)
Chloride: 107 mEq/L (ref 96–112)
Creatinine, Ser: 1.23 mg/dL (ref 0.40–1.50)
GFR: 56.88 mL/min — ABNORMAL LOW (ref >60.00)
Glucose, Bld: 96 mg/dL (ref 70–99)
Potassium: 4.2 mEq/L (ref 3.5–5.1)
Sodium: 143 mEq/L (ref 135–145)
Total Bilirubin: 0.6 mg/dL (ref 0.2–1.2)
Total Protein: 6.6 g/dL (ref 6.0–8.3)

## 2019-06-09 NOTE — Progress Notes (Signed)
No critical labs need to be addressed urgently. We will discuss labs in detail at upcoming office visit.   

## 2019-06-15 ENCOUNTER — Ambulatory Visit (INDEPENDENT_AMBULATORY_CARE_PROVIDER_SITE_OTHER): Payer: Medicare HMO

## 2019-06-15 DIAGNOSIS — Z0000 Encounter for general adult medical examination without abnormal findings: Secondary | ICD-10-CM

## 2019-06-15 NOTE — Patient Instructions (Signed)
Jack Norris , Thank you for taking time to come for your Medicare Wellness Visit. I appreciate your ongoing commitment to your health goals. Please review the following plan we discussed and let me know if I can assist you in the future.   Screening recommendations/referrals: Colonoscopy: no longer required Recommended yearly ophthalmology/optometry visit for glaucoma screening and checkup Recommended yearly dental visit for hygiene and checkup  Vaccinations: Influenza vaccine: will get at office visit tomorrow Pneumococcal vaccine: Completed series Tdap vaccine: declined Shingles vaccine: will check with pharmacy    Advanced directives: Advance directive discussed with you today. Even though you declined this today please call our office should you change your mind and we can give you the proper paperwork for you to fill out.  Conditions/risks identified: hypertension, hyperlipidemia  Next appointment: 06/16/2019 @ 3 pm  Preventive Care 78 Years and Older, Male Preventive care refers to lifestyle choices and visits with your health care provider that can promote health and wellness. What does preventive care include?  A yearly physical exam. This is also called an annual well check.  Dental exams once or twice a year.  Routine eye exams. Ask your health care provider how often you should have your eyes checked.  Personal lifestyle choices, including:  Daily care of your teeth and gums.  Regular physical activity.  Eating a healthy diet.  Avoiding tobacco and drug use.  Limiting alcohol use.  Practicing safe sex.  Taking low doses of aspirin every day.  Taking vitamin and mineral supplements as recommended by your health care provider. What happens during an annual well check? The services and screenings done by your health care provider during your annual well check will depend on your age, overall health, lifestyle risk factors, and family history of disease.  Counseling  Your health care provider may ask you questions about your:  Alcohol use.  Tobacco use.  Drug use.  Emotional well-being.  Home and relationship well-being.  Sexual activity.  Eating habits.  History of falls.  Memory and ability to understand (cognition).  Work and work Statistician. Screening  You may have the following tests or measurements:  Height, weight, and BMI.  Blood pressure.  Lipid and cholesterol levels. These may be checked every 5 years, or more frequently if you are over 78 years old.  Skin check.  Lung cancer screening. You may have this screening every year starting at age 78 if you have a 30-pack-year history of smoking and currently smoke or have quit within the past 15 years.  Fecal occult blood test (FOBT) of the stool. You may have this test every year starting at age 78.  Flexible sigmoidoscopy or colonoscopy. You may have a sigmoidoscopy every 5 years or a colonoscopy every 10 years starting at age 78.  Prostate cancer screening. Recommendations will vary depending on your family history and other risks.  Hepatitis C blood test.  Hepatitis B blood test.  Sexually transmitted disease (STD) testing.  Diabetes screening. This is done by checking your blood sugar (glucose) after you have not eaten for a while (fasting). You may have this done every 1-3 years.  Abdominal aortic aneurysm (AAA) screening. You may need this if you are a current or former smoker.  Osteoporosis. You may be screened starting at age 78 if you are at high risk. Talk with your health care provider about your test results, treatment options, and if necessary, the need for more tests. Vaccines  Your health care provider may recommend  certain vaccines, such as:  Influenza vaccine. This is recommended every year.  Tetanus, diphtheria, and acellular pertussis (Tdap, Td) vaccine. You may need a Td booster every 10 years.  Zoster vaccine. You may need this  after age 78.  Pneumococcal 13-valent conjugate (PCV13) vaccine. One dose is recommended after age 78.  Pneumococcal polysaccharide (PPSV23) vaccine. One dose is recommended after age 78. Talk to your health care provider about which screenings and vaccines you need and how often you need them. This information is not intended to replace advice given to you by your health care provider. Make sure you discuss any questions you have with your health care provider. Document Released: 09/06/2015 Document Revised: 04/29/2016 Document Reviewed: 06/11/2015 Elsevier Interactive Patient Education  2017 [employer] Prevention in the Home Falls can cause injuries. They can happen to people of all ages. There are many things you can do to make your home safe and to help prevent falls. What can I do on the outside of my home?  Regularly fix the edges of walkways and driveways and fix any cracks.  Remove anything that might make you trip as you walk through a door, such as a raised step or threshold.  Trim any bushes or trees on the path to your home.  Use bright outdoor lighting.  Clear any walking paths of anything that might make someone trip, such as rocks or tools.  Regularly check to see if handrails are loose or broken. Make sure that both sides of any steps have handrails.  Any raised decks and porches should have guardrails on the edges.  Have any leaves, snow, or ice cleared regularly.  Use sand or salt on walking paths during winter.  Clean up any spills in your garage right away. This includes oil or grease spills. What can I do in the bathroom?  Use night lights.  Install grab bars by the toilet and in the tub and shower. Do not use towel bars as grab bars.  Use non-skid mats or decals in the tub or shower.  If you need to sit down in the shower, use a plastic, non-slip stool.  Keep the floor dry. Clean up any water that spills on the floor as soon as it happens.   Remove soap buildup in the tub or shower regularly.  Attach bath mats securely with double-sided non-slip rug tape.  Do not have throw rugs and other things on the floor that can make you trip. What can I do in the bedroom?  Use night lights.  Make sure that you have a light by your bed that is easy to reach.  Do not use any sheets or blankets that are too big for your bed. They should not hang down onto the floor.  Have a firm chair that has side arms. You can use this for support while you get dressed.  Do not have throw rugs and other things on the floor that can make you trip. What can I do in the kitchen?  Clean up any spills right away.  Avoid walking on wet floors.  Keep items that you use a lot in easy-to-reach places.  If you need to reach something above you, use a strong step stool that has a grab bar.  Keep electrical cords out of the way.  Do not use floor polish or wax that makes floors slippery. If you must use wax, use non-skid floor wax.  Do not have throw rugs and other  things on the floor that can make you trip. What can I do with my stairs?  Do not leave any items on the stairs.  Make sure that there are handrails on both sides of the stairs and use them. Fix handrails that are broken or loose. Make sure that handrails are as long as the stairways.  Check any carpeting to make sure that it is firmly attached to the stairs. Fix any carpet that is loose or worn.  Avoid having throw rugs at the top or bottom of the stairs. If you do have throw rugs, attach them to the floor with carpet tape.  Make sure that you have a light switch at the top of the stairs and the bottom of the stairs. If you do not have them, ask someone to add them for you. What else can I do to help prevent falls?  Wear shoes that:  Do not have high heels.  Have rubber bottoms.  Are comfortable and fit you well.  Are closed at the toe. Do not wear sandals.  If you use a  stepladder:  Make sure that it is fully opened. Do not climb a closed stepladder.  Make sure that both sides of the stepladder are locked into place.  Ask someone to hold it for you, if possible.  Clearly mark and make sure that you can see:  Any grab bars or handrails.  First and last steps.  Where the edge of each step is.  Use tools that help you move around (mobility aids) if they are needed. These include:  Canes.  Walkers.  Scooters.  Crutches.  Turn on the lights when you go into a dark area. Replace any light bulbs as soon as they burn out.  Set up your furniture so you have a clear path. Avoid moving your furniture around.  If any of your floors are uneven, fix them.  If there are any pets around you, be aware of where they are.  Review your medicines with your doctor. Some medicines can make you feel dizzy. This can increase your chance of falling. Ask your doctor what other things that you can do to help prevent falls. This information is not intended to replace advice given to you by your health care provider. Make sure you discuss any questions you have with your health care provider. Document Released: 06/06/2009 Document Revised: 01/16/2016 Document Reviewed: 09/14/2014 Elsevier Interactive Patient Education  2017 [employer].

## 2019-06-15 NOTE — Progress Notes (Signed)
Subjective:   Jack Norris is a 78 y.o. male who presents for Medicare Annual/Subsequent preventive examination.  Review of Systems:    This visit is being conducted through telemedicine via telephone at the nurse health advisor's home address due to the COVID-19 pandemic. This patient has given me verbal consent via doximity to conduct this visit, patient states they are participating from their home address. Patient and myself are on the telephone call. There is no referral for this visit. Some vital signs may be absent or patient reported.    Patient identification: identified by name, DOB, and current address  Cardiac Risk Factors include: advanced age (>69men, >24 women);hypertension;male gender;Other (see comment), Risk factor comments: hypercholesterolemia     Objective:    Vitals: There were no vitals taken for this visit.  There is no height or weight on file to calculate BMI.  Advanced Directives 06/15/2019 04/22/2018 04/08/2018 04/20/2017 04/16/2016 09/08/2012 06/02/2012  Does Patient Have a Medical Advance Directive? No No No No No Patient does not have advance directive;Patient would not like information Patient does not have advance directive;Patient would not like information  Would patient like information on creating a medical advance directive? No - Patient declined No - Patient declined No - Patient declined - Yes - Educational materials given - -  Pre-existing out of facility DNR order (yellow form or pink MOST form) - - - - - - No    Tobacco Social History   Tobacco Use  Smoking Status Former Smoker  . Years: 8.00  . Types: Cigarettes  . Quit date: 01/05/1967  . Years since quitting: 52.4  Smokeless Tobacco Never Used  Tobacco Comment   QUIT AGE 54     Counseling given: Not Answered Comment: QUIT AGE 54   Clinical Intake:  Pre-visit preparation completed: Yes  Pain : No/denies pain     Nutritional Risks: None Diabetes: No  How often do you  need to have someone help you when you read instructions, pamphlets, or other written materials from your doctor or pharmacy?: 1 - Never What is the last grade level you completed in school?: Bachelors  Interpreter Needed?: No  Information entered by :: CJohnson, LPN  Past Medical History:  Diagnosis Date  . Asthma, mild    as child-only now if has URI  . BPH (benign prostatic hypertrophy)   . BPPV (benign paroxysmal positional vertigo)   . Diverticulosis   . Eczema   . Fatty liver disease, nonalcoholic   . Frequent nosebleeds    History of  . GERD (gastroesophageal reflux disease)    occ uses TUMS as needed  . Glaucoma   . History of bronchitis   . History of colon polyps   . History of diverticulitis   . History of kidney stones   . Hyperlipemia   . Hypertension   . Influenza A 11/2017  . OA (osteoarthritis)   . Obesity   . Overactive bladder 2020  . PVCs (premature ventricular contractions)   . Renal calculi right    AND LEFT X1 NON-OBSTRUCTIVE  . Right sided sciatica    Bilateral buttock and thighs  . Simple renal cyst right interpoler, simpler in nature , asymptomatic  per urologist note (dr dahlstedt)  12-26-2011   BENIGN, has been drained  . Testosterone deficiency   . Wears glasses   . Wears hearing aid    Past Surgical History:  Procedure Laterality Date  . APPENDECTOMY    . bone spur removal  BILATERAL GREAT TOE  . COLONOSCOPY    . CYSTO/ RIGHT RETROGRADE PYELOGRAM/ RIGHT URETER DILATATION/ RIGHT URETEROSCOPY/ LASER LITHOTRIPSY RIGHT RENAL PELVIS AND LOWER POLE CALCULI/ PLACEMENT STENT  01-11-2012  DR DAHLSTEDT   RIGHT RENAL PELVIS AND LOWER POLE CALCULI  . CYSTOSCOPY W/ RETROGRADES  06/02/2012   Procedure: CYSTOSCOPY WITH RETROGRADE PYELOGRAM;  Surgeon: Franchot Gallo, MD;  Location: [facility];  Service: Urology;  Laterality: Right;  . CYSTOSCOPY WITH URETEROSCOPY  06/02/2012   Procedure: CYSTOSCOPY WITH URETEROSCOPY;  Surgeon:  Franchot Gallo, MD;  Location[facility];  Service: Urology;  Laterality: Right;  90 MIN ALSO EXTRACTION OF CALCULI   . EYE SURGERY Bilateral 2020  . LITHOTRIPSY  APPROX.  1998   ESWL  left kidney x 2  . ROTATOR CUFF REPAIR  2012   LEFT SHOULDER  . SHOULDER ARTHROSCOPY WITH ROTATOR CUFF REPAIR AND SUBACROMIAL DECOMPRESSION  09/12/2012   Procedure: SHOULDER ARTHROSCOPY WITH ROTATOR CUFF REPAIR AND SUBACROMIAL DECOMPRESSION;  Surgeon: Nita Sells, MD;  Location: [facility];  Service: Orthopedics;  Laterality: Right;  see above and biceps tendonotomy  . TRANSURETHRAL RESECTION OF BLADDER NECK N/A 04/08/2018   Procedure: TRANSURETHRAL RESECTION OF BLADDER NECK CONTRACTURE;  Surgeon: Franchot Gallo, MD;  Location: [facility];  Service: Urology;  Laterality: N/A;  . TRANSURETHRAL RESECTION OF PROSTATE  11-29-2009   BPH  . VASECTOMY  1970'S   GEN. ANES.   Family History  Problem Relation Age of Onset  . Cancer Mother        breast and kidney   . Squamous cell carcinoma Mother   . Cancer Father        lung  . Heart disease Father    Social History   Socioeconomic History  . Marital status: Married    Spouse name: Not on file  . Number of children: 3  . Years of education: Not on file  . Highest education level: Not on file  Occupational History  . Occupation: Industrial/product designer: [employer]  . Financial resource strain: Not hard at all  . Food insecurity    Worry: Never true    Inability: Never true  . Transportation needs    Medical: No    Non-medical: No  Tobacco Use  . Smoking status: Former Smoker    Years: 8.00    Types: Cigarettes    Quit date: 01/05/1967    Years since quitting: 52.4  . Smokeless tobacco: Never Used  . Tobacco comment: QUIT AGE 38  Substance and Sexual Activity  . Alcohol use: Yes    Alcohol/week: 0.0 standard drinks    Comment: OCCASIONAL  . Drug  use: No  . Sexual activity: Yes  Lifestyle  . Physical activity    Days per week: 0 days    Minutes per session: 0 min  . Stress: Not at all  Relationships  . Social Herbalist on phone: Not on file    Gets together: Not on file    Attends religious service: Not on file    Active member of club or organization: Not on file    Attends meetings of clubs or organizations: Not on file    Relationship status: Not on file  Other Topics Concern  . Not on file  Social History Narrative   regular exercise    Full code, (Reviewed 2014)    Outpatient Encounter Medications as  of 06/15/2019  Medication Sig  . albuterol (PROAIR HFA) 108 (90 Base) MCG/ACT inhaler Inhale 2 puffs into the lungs every 6 (six) hours as needed for wheezing or shortness of breath.  Marland Kitchen aspirin 81 MG tablet Take 81 mg by mouth daily.   . B-D 3CC LUER-LOK SYR 18GX1-1/2 18G X 1-1/2" 3 ML MISC USE ONCE A WEEK AS DIRECTED  . fenofibrate micronized (LOFIBRA) 67 MG capsule Take 1 capsule (67 mg total) by mouth daily before breakfast.  . latanoprost (XALATAN) 0.005 % ophthalmic solution Place 1 drop into the right eye at bedtime.  Marland Kitchen lisinopril (ZESTRIL) 40 MG tablet Take 1 tablet (40 mg total) by mouth daily.  Marland Kitchen oxybutynin (DITROPAN) 5 MG tablet Take 5 mg by mouth daily as needed. Overactive bladder  . psyllium (METAMUCIL) 58.6 % powder Take 1 packet by mouth daily.  . rosuvastatin (CRESTOR) 5 MG tablet Take 1 tablet (5 mg total) by mouth daily.  Marland Kitchen testosterone cypionate (DEPOTESTOSTERONE CYPIONATE) 200 MG/ML injection Inject 0.5 mLs (100 mg total) into the muscle every 7 (seven) days.   No facility-administered encounter medications on file as of 06/15/2019.     Activities of Daily Living In your present state of health, do you have any difficulty performing the following activities: 06/15/2019  Hearing? Y  Comment wears hearing aids  Vision? Y  Comment left eye can't see a straight line  Difficulty  concentrating or making decisions? Y  Comment some slight memory loss  Walking or climbing stairs? N  Dressing or bathing? N  Doing errands, shopping? N  Preparing Food and eating ? N  Using the Toilet? N  In the past six months, have you accidently leaked urine? Y  Comment some leakage when he holds it for a while  Do you have problems with loss of bowel control? N  Managing your Medications? N  Managing your Finances? N  Housekeeping or managing your Housekeeping? N  Some recent data might be hidden    Patient Care Team: Jinny Sanders, MD as PCP - General Clent Jacks, MD as Consulting Physician (Ophthalmology) Franchot Gallo, MD as Consulting Physician (Urology) Jannet Mantis, MD as Consulting Physician (Dermatology)   Assessment:   This is a routine wellness examination for Jack Norris.  Exercise Activities and Dietary recommendations Current Exercise Habits: The patient does not participate in regular exercise at present, Exercise limited by: None identified  Goals    . Increase physical activity     Starting 04/22/2018, I will continue to walk at least 60 minutes 2-3 days per week.     . Patient Stated     06/15/2019, I will try to start back walking 2-3 miles daily.        Fall Risk Fall Risk  06/15/2019 04/22/2018 04/20/2017 04/16/2016 04/15/2015  Falls in the past year? 0 No No No No  Number falls in past yr: 0 - - - -  Injury with Fall? 0 - - - -  Risk for fall due to : Medication side effect;Impaired balance/gait - - - -  Follow up Falls evaluation completed;Falls prevention discussed - - - -   Is the patient's home free of loose throw rugs in walkways, pet beds, electrical cords, etc?   yes      Grab bars in the bathroom? yes      Handrails on the stairs?   yes      Adequate lighting?   yes  Timed Get Up and Go Performed: N/A  Depression Screen PHQ 2/9 Scores 06/15/2019 04/22/2018 04/20/2017 04/16/2016  PHQ - 2 Score 0 0 0 0  PHQ- 9 Score 0 0 - -     Cognitive Function MMSE - Mini Mental State Exam 06/15/2019 04/22/2018 04/20/2017 04/16/2016  Orientation to time 5 5 5 5   Orientation to Place 5 5 5 5   Registration 3 3 3 3   Attention/ Calculation 5 0 0 0  Recall 3 3 3 3   Language- name 2 objects - 0 0 0  Language- repeat 1 1 1 1   Language- follow 3 step command - 3 3 3   Language- read & follow direction - 0 0 0  Write a sentence - 0 0 0  Copy design - 0 0 0  Total score - 20 20 20   Mini Cog  Mini-Cog screen was completed. Maximum score is 22. A value of 0 denotes this part of the MMSE was not completed or the patient failed this part of the Mini-Cog screening.       Immunization History  Administered Date(s) Administered  . Influenza Split 06/26/2011, 07/11/2012  . Influenza Whole 05/24/2008, 06/24/2008, 07/04/2009, 05/24/2010  . Influenza, High Dose Seasonal PF 06/14/2018  . Influenza,inj,Quad PF,6+ Mos 07/26/2013, 06/12/2014, 04/15/2015, 04/16/2016, 07/01/2017  . Pneumococcal Conjugate-13 04/15/2015  . Pneumococcal Polysaccharide-23 09/14/2007  . Td 08/02/2007  . Zoster 04/08/2009    Qualifies for Shingles Vaccine?  Yes  Screening Tests Health Maintenance  Topic Date Due  . INFLUENZA VACCINE  03/25/2019  . TETANUS/TDAP  06/24/2020 (Originally 08/01/2017)  . PNA vac Low Risk Adult  Completed   Cancer Screenings: Lung: Low Dose CT Chest recommended if Age 78-80 years, 30 pack-year currently smoking OR have quit w/in 15years. Patient does not qualify. Colorectal: no longer required  Additional Screenings:  Hepatitis C Screening: N/A      Plan:   Patient will try to walk 2-3 miles a day.   I have personally reviewed and noted the following in the patient's chart:   . Medical and social history . Use of alcohol, tobacco or illicit drugs  . Current medications and supplements . Functional ability and status . Nutritional status . Physical activity . Advanced directives . List of other physicians .  Hospitalizations, surgeries, and ER visits in previous 12 months . Vitals . Screenings to include cognitive, depression, and falls . Referrals and appointments  In addition, I have reviewed and discussed with patient certain preventive protocols, quality metrics, and best practice recommendations. A written personalized care plan for preventive services as well as general preventive health recommendations were provided to patient.     Andrez Grime, LPN  [id number]

## 2019-06-15 NOTE — Progress Notes (Signed)
PCP notes:  Health Maintenance:   needs flu vaccine Declined Tdap due to insurance Will check with pharmacy about Shingrix vaccine       Abnormal Screenings: none    Patient concerns: Wants to discuss treatment options for memory loss    Nurse concerns: none    Next PCP appt.: 06/16/2019 @ 3 pm

## 2019-06-16 ENCOUNTER — Ambulatory Visit (INDEPENDENT_AMBULATORY_CARE_PROVIDER_SITE_OTHER): Payer: Medicare HMO | Admitting: Family Medicine

## 2019-06-16 ENCOUNTER — Other Ambulatory Visit: Payer: Self-pay

## 2019-06-16 ENCOUNTER — Encounter: Payer: Self-pay | Admitting: Family Medicine

## 2019-06-16 VITALS — BP 164/88 | HR 92 | Temp 98.3°F | Ht 69.25 in | Wt 220.5 lb

## 2019-06-16 DIAGNOSIS — G3184 Mild cognitive impairment, so stated: Secondary | ICD-10-CM | POA: Insufficient documentation

## 2019-06-16 DIAGNOSIS — Z23 Encounter for immunization: Secondary | ICD-10-CM

## 2019-06-16 DIAGNOSIS — R911 Solitary pulmonary nodule: Secondary | ICD-10-CM | POA: Diagnosis not present

## 2019-06-16 DIAGNOSIS — Z0000 Encounter for general adult medical examination without abnormal findings: Secondary | ICD-10-CM | POA: Diagnosis not present

## 2019-06-16 DIAGNOSIS — I10 Essential (primary) hypertension: Secondary | ICD-10-CM

## 2019-06-16 DIAGNOSIS — IMO0001 Reserved for inherently not codable concepts without codable children: Secondary | ICD-10-CM

## 2019-06-16 NOTE — Assessment & Plan Note (Signed)
Elevatred despite lisinopril max.. follow at home if not at goal.. will need to add additional med. Consider screen for OSA as well givne weight gain.

## 2019-06-16 NOTE — Patient Instructions (Addendum)
Follow BP at home... gaol BP < 150/90.  Work on weight loss and  increaseregular exercise.   Preventive Care 17 Years and Older, Male Preventive care refers to lifestyle choices and visits with your health care provider that can promote health and wellness. This includes:  A yearly physical exam. This is also called an annual well check.  Regular dental and eye exams.  Immunizations.  Screening for certain conditions.  Healthy lifestyle choices, such as diet and exercise. What can I expect for my preventive care visit? Physical exam Your health care provider will check:  Height and weight. These may be used to calculate body mass index (BMI), which is a measurement that tells if you are at a healthy weight.  Heart rate and blood pressure.  Your skin for abnormal spots. Counseling Your health care provider may ask you questions about:  Alcohol, tobacco, and drug use.  Emotional well-being.  Home and relationship well-being.  Sexual activity.  Eating habits.  History of falls.  Memory and ability to understand (cognition).  Work and work Statistician. What immunizations do I need?  Influenza (flu) vaccine  This is recommended every year. Tetanus, diphtheria, and pertussis (Tdap) vaccine  You may need a Td booster every 10 years. Varicella (chickenpox) vaccine  You may need this vaccine if you have not already been vaccinated. Zoster (shingles) vaccine  You may need this after age 14. Pneumococcal conjugate (PCV13) vaccine  One dose is recommended after age 13. Pneumococcal polysaccharide (PPSV23) vaccine  One dose is recommended after age 22. Measles, mumps, and rubella (MMR) vaccine  You may need at least one dose of MMR if you were born in 1957 or later. You may also need a second dose. Meningococcal conjugate (MenACWY) vaccine  You may need this if you have certain conditions. Hepatitis A vaccine  You may need this if you have certain conditions  or if you travel or work in places where you may be exposed to hepatitis A. Hepatitis B vaccine  You may need this if you have certain conditions or if you travel or work in places where you may be exposed to hepatitis B. Haemophilus influenzae type b (Hib) vaccine  You may need this if you have certain conditions. You may receive vaccines as individual doses or as more than one vaccine together in one shot (combination vaccines). Talk with your health care provider about the risks and benefits of combination vaccines. What tests do I need? Blood tests  Lipid and cholesterol levels. These may be checked every 5 years, or more frequently depending on your overall health.  Hepatitis C test.  Hepatitis B test. Screening  Lung cancer screening. You may have this screening every year starting at age 9 if you have a 30-pack-year history of smoking and currently smoke or have quit within the past 15 years.  Colorectal cancer screening. All adults should have this screening starting at age 43 and continuing until age 40. Your health care provider may recommend screening at age 61 if you are at increased risk. You will have tests every 1-10 years, depending on your results and the type of screening test.  Prostate cancer screening. Recommendations will vary depending on your family history and other risks.  Diabetes screening. This is done by checking your blood sugar (glucose) after you have not eaten for a while (fasting). You may have this done every 1-3 years.  Abdominal aortic aneurysm (AAA) screening. You may need this if you are a current  or former smoker.  Sexually transmitted disease (STD) testing. Follow these instructions at home: Eating and drinking  Eat a diet that includes fresh fruits and vegetables, whole grains, lean protein, and low-fat dairy products. Limit your intake of foods with high amounts of sugar, saturated fats, and salt.  Take vitamin and mineral supplements as  recommended by your health care provider.  Do not drink alcohol if your health care provider tells you not to drink.  If you drink alcohol: ? Limit how much you have to 0-2 drinks a day. ? Be aware of how much alcohol is in your drink. In the [place] one drink equals one 12 oz bottle of beer (355 mL), one 5 oz glass of wine (148 mL), or one 1 oz glass of hard liquor (44 mL). Lifestyle  Take daily care of your teeth and gums.  Stay active. Exercise for at least 30 minutes on 5 or more days each week.  Do not use any products that contain nicotine or tobacco, such as cigarettes, e-cigarettes, and chewing tobacco. If you need help quitting, ask your health care provider.  If you are sexually active, practice safe sex. Use a condom or other form of protection to prevent STIs (sexually transmitted infections).  Talk with your health care provider about taking a low-dose aspirin or statin. What's next?  Visit your health care provider once a year for a well check visit.  Ask your health care provider how often you should have your eyes and teeth checked.  Stay up to date on all vaccines. This information is not intended to replace advice given to you by your health care provider. Make sure you discuss any questions you have with your health care provider. Document Released: 09/06/2015 Document Revised: 08/04/2018 Document Reviewed: 08/04/2018 Elsevier Patient Education  2020 [employer].

## 2019-06-16 NOTE — Progress Notes (Signed)
Chief Complaint  Patient presents with  . Annual Exam    History of Present Illness: HPI  The patient presents for  complete physical and review of chronic health problems. He/She also has the following acute concerns today:memory loss, mild  Wife feels memory is worsening gradually over past year. Forgets name.  The patient saw a LPN or RN for medicare wellness visit.  Prevention and wellness was reviewed in detail. Note reviewed and important notes copied below.  Health Maintenance:    needs flu vaccine Declined Tdap due to insurance Will check with pharmacy about Shingrix vaccine  Abnormal Screenings: none Patient concerns: Wants to discuss treatment options for memory loss  06/16/19  Hypertension:   Above goal on lisinopril 40 mg BP Readings from Last 3 Encounters:  06/16/19 (!) 150/90  03/15/19 140/80  03/09/19 (!) 150/76  Using medication without problems or lightheadedness:  none Chest pain with exertion: none Edema:none Short of breath:none Average home BPs: well controlled at home. Other issues:  Wt Readings from Last 3 Encounters:  06/16/19 220 lb 8 oz (100 kg)  03/15/19 217 lb 9.6 oz (98.7 kg)  03/09/19 216 lb 8 oz (98.2 kg)     Elevated Cholesterol:  On rosuvastatin and fenofibrate Lab Results  Component Value Date   CHOL 145 06/09/2019   HDL 50.30 06/09/2019   LDLCALC 78 06/09/2019   LDLDIRECT 107.6 12/14/2011   TRIG 83.0 06/09/2019   CHOLHDL 3 06/09/2019  Using medications without problems:none Muscle aches: none Diet compliance: moderate Exercise: 3 times week Other complaints:  Transurethral resection  Of bladder neck contracture.. minimally effective.  OVeractive bladder.. using oxybutynin prn. Patient Care Team: Jinny Sanders, MD as PCP - General Clent Jacks, MD as Consulting Physician (Ophthalmology) Franchot Gallo, MD as Consulting Physician (Urology) Jannet Mantis, MD as Consulting Physician (Dermatology)   COVID  19 screen No recent travel or known exposure to [place] The patient denies respiratory symptoms of COVID 19 at this time.  The importance of social distancing was discussed today.   Review of Systems  Constitutional: Negative for chills and fever.  HENT: Negative for congestion and ear pain.   Eyes: Negative for pain and redness.  Respiratory: Negative for cough and shortness of breath.   Cardiovascular: Negative for chest pain, palpitations and leg swelling.  Gastrointestinal: Negative for abdominal pain, blood in stool, constipation, diarrhea, nausea and vomiting.  Genitourinary: Negative for dysuria.  Musculoskeletal: Negative for falls and myalgias.  Skin: Negative for rash.  Neurological: Negative for dizziness.  Psychiatric/Behavioral: Negative for depression. The patient is not nervous/anxious.       Past Medical History:  Diagnosis Date  . Asthma, mild    as child-only now if has URI  . BPH (benign prostatic hypertrophy)   . BPPV (benign paroxysmal positional vertigo)   . Diverticulosis   . Eczema   . Fatty liver disease, nonalcoholic   . Frequent nosebleeds    History of  . GERD (gastroesophageal reflux disease)    occ uses TUMS as needed  . Glaucoma   . History of bronchitis   . History of colon polyps   . History of diverticulitis   . History of kidney stones   . Hyperlipemia   . Hypertension   . Influenza A 11/2017  . OA (osteoarthritis)   . Obesity   . Overactive bladder 2020  . PVCs (premature ventricular contractions)   . Renal calculi right    AND LEFT X1 NON-OBSTRUCTIVE  .  Right sided sciatica    Bilateral buttock and thighs  . Simple renal cyst right interpoler, simpler in nature , asymptomatic  per urologist note (dr dahlstedt)  12-26-2011   BENIGN, has been drained  . Testosterone deficiency   . Wears glasses   . Wears hearing aid     reports that he quit smoking about 52 years ago. His smoking use included cigarettes. He quit after 8.00 years  of use. He has never used smokeless tobacco. He reports current alcohol use. He reports that he does not use drugs.   Current Outpatient Medications:  .  albuterol (PROAIR HFA) 108 (90 Base) MCG/ACT inhaler, Inhale 2 puffs into the lungs every 6 (six) hours as needed for wheezing or shortness of breath., Disp: 18 g, Rfl: 2 .  aspirin 81 MG tablet, Take 81 mg by mouth daily. , Disp: , Rfl:  .  B-D 3CC LUER-LOK SYR 18GX1-1/2 18G X 1-1/2" 3 ML MISC, USE ONCE A WEEK AS DIRECTED, Disp: , Rfl: 2 .  fenofibrate micronized (LOFIBRA) 67 MG capsule, Take 1 capsule (67 mg total) by mouth daily before breakfast., Disp: 90 capsule, Rfl: 3 .  latanoprost (XALATAN) 0.005 % ophthalmic solution, Place 1 drop into the right eye at bedtime., Disp: 2.5 mL, Rfl: 12 .  lisinopril (ZESTRIL) 40 MG tablet, Take 1 tablet (40 mg total) by mouth daily., Disp: 90 tablet, Rfl: 1 .  oxybutynin (DITROPAN) 5 MG tablet, Take 5 mg by mouth daily as needed. Overactive bladder, Disp: , Rfl:  .  psyllium (METAMUCIL) 58.6 % powder, Take 1 packet by mouth daily., Disp: , Rfl:  .  rosuvastatin (CRESTOR) 5 MG tablet, Take 1 tablet (5 mg total) by mouth daily., Disp: 90 tablet, Rfl: 3 .  testosterone cypionate (DEPOTESTOSTERONE CYPIONATE) 200 MG/ML injection, Inject 0.5 mLs (100 mg total) into the muscle every 7 (seven) days., Disp: 10 mL, Rfl: 0   Observations/Objective: Blood pressure (!) 150/90, pulse 92, temperature 98.3 F (36.8 C), temperature source Temporal, height 5' 9.25" (1.759 m), weight 220 lb 8 oz (100 kg), SpO2 96 %.  Physical Exam Constitutional:      General: He is not in acute distress.    Appearance: Normal appearance. He is well-developed. He is not ill-appearing or toxic-appearing.  HENT:     Head: Normocephalic and atraumatic.     Right Ear: Hearing, tympanic membrane, ear canal and external ear normal.     Left Ear: Hearing, tympanic membrane, ear canal and external ear normal.     Nose: Nose normal.      Mouth/Throat:     Pharynx: Uvula midline.  Eyes:     General: Lids are normal. Lids are everted, no foreign bodies appreciated.     Conjunctiva/sclera: Conjunctivae normal.     Pupils: Pupils are equal, round, and reactive to light.  Neck:     Musculoskeletal: Normal range of motion and neck supple.     Thyroid: No thyroid mass or thyromegaly.     Vascular: No carotid bruit.     Trachea: Trachea and phonation normal.  Cardiovascular:     Rate and Rhythm: Normal rate and regular rhythm.     Pulses: Normal pulses.     Heart sounds: S1 normal and S2 normal. No murmur. No gallop.   Pulmonary:     Breath sounds: Normal breath sounds. No wheezing, rhonchi or rales.  Abdominal:     General: Bowel sounds are normal.     Palpations: Abdomen is  soft.     Tenderness: There is no abdominal tenderness. There is no guarding or rebound.     Hernia: No hernia is present.  Lymphadenopathy:     Cervical: No cervical adenopathy.  Skin:    General: Skin is warm and dry.     Findings: No rash.  Neurological:     Mental Status: He is alert.     Cranial Nerves: No cranial nerve deficit.     Sensory: No sensory deficit.     Gait: Gait normal.     Deep Tendon Reflexes: Reflexes are normal and symmetric.  Psychiatric:        Speech: Speech normal.        Behavior: Behavior normal.        Judgment: Judgment normal.      MMSE 28/30  Assessment and Plan The patient's preventative maintenance and recommended screening tests for an annual wellness exam were reviewed in full today. Brought up to date unless services declined.  Counselled on the importance of diet, exercise, and its role in overall health and mortality. The patient's FH and SH was reviewed, including their home life, tobacco status, and drug and alcohol status.   Vaccines: Uptodate. Given flu vaccine. Colon: 05/2013, [place] nml, no further indicated.  Prostate: per uro. PSA  0.95  7/2019Followed there Dr. Diona Fanti  Former  smoker, remotely.   Mild cognitive impairment with memory loss MMSE mild decreased.  Pt and wife bothered by symptoms.  Disccussed avoiding meds that affect cognition, increase exercise and follow. He will consider prevagen. No clear indication to start aricept. Neg thyroid and B12 in past.  Essential hypertension Elevatred despite lisinopril max.. follow at home if not at goal.. will need to add additional med. Consider screen for OSA as well givne weight gain.  Lung nodule < 6cm on CT Re-eval in 1.5-2 years.     Eliezer Lofts, MD

## 2019-06-16 NOTE — Assessment & Plan Note (Signed)
Re-eval in 1.5-2 years.

## 2019-06-16 NOTE — Assessment & Plan Note (Signed)
MMSE mild decreased.  Pt and wife bothered by symptoms.  Disccussed avoiding meds that affect cognition, increase exercise and follow. He will consider prevagen. No clear indication to start aricept. Neg thyroid and B12 in past.

## 2019-06-23 DIAGNOSIS — Z0100 Encounter for examination of eyes and vision without abnormal findings: Secondary | ICD-10-CM | POA: Diagnosis not present

## 2019-06-26 ENCOUNTER — Telehealth: Payer: Self-pay | Admitting: Family Medicine

## 2019-06-26 DIAGNOSIS — R413 Other amnesia: Secondary | ICD-10-CM

## 2019-06-26 NOTE — Telephone Encounter (Signed)
Patient called.  Patient said he was seen by Dr.Bedsole on 06/15/19. One of his issues was memory loss.  Patient said he'd like to be referred to a specialist for memory loss.  Patient prefers to go to [place].  Patient prefers an appointment in December.

## 2019-06-26 NOTE — Telephone Encounter (Signed)
Call  Does he want neurocognitive testing with a psychologist or would he like to see a neurologist

## 2019-06-26 NOTE — Telephone Encounter (Signed)
Spoke with Sonia Side.  He would like a neurology referral.

## 2019-06-27 NOTE — Telephone Encounter (Signed)
Referral sent 

## 2019-07-04 ENCOUNTER — Encounter: Payer: Self-pay | Admitting: Neurology

## 2019-07-04 ENCOUNTER — Telehealth: Payer: Self-pay | Admitting: Neurology

## 2019-07-04 ENCOUNTER — Other Ambulatory Visit: Payer: Self-pay

## 2019-07-04 ENCOUNTER — Ambulatory Visit (INDEPENDENT_AMBULATORY_CARE_PROVIDER_SITE_OTHER): Payer: Medicare HMO | Admitting: Neurology

## 2019-07-04 VITALS — Temp 97.6°F | Ht 69.25 in | Wt 221.5 lb

## 2019-07-04 DIAGNOSIS — H9193 Unspecified hearing loss, bilateral: Secondary | ICD-10-CM

## 2019-07-04 DIAGNOSIS — R413 Other amnesia: Secondary | ICD-10-CM | POA: Diagnosis not present

## 2019-07-04 DIAGNOSIS — H903 Sensorineural hearing loss, bilateral: Secondary | ICD-10-CM

## 2019-07-04 NOTE — Progress Notes (Signed)
PATIENT: Jack Norris DOB: 07-24-1941  Chief Complaint  Patient presents with  . Memory Loss    MMSE 29/30 - 14 animals.  He is here to be evaluated for short-term memory changes.  Marland Kitchen PCP    Jinny Sanders, MD     HISTORICAL  Jack Norris is a 78 year old male, seen in request by his primary care physician Dr. Eliezer Lofts for evaluation of memory loss, initial evaluation was on July 05, 2019.  I have reviewed and summarized the referring note from the referring physician.  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  He also complains of occasionally vertigo, unsteady on his feet, such as standing up with 1 foot is hard to keep his balance  He was noted by his family tends to repeat himself, sometimes difficult for him to interpret conversation since 2019.  He was diagnosed with obstructive sleep apnea in January 2020, began to use CPAP machine,   REVIEW OF SYSTEMS: Full 14 system review of systems performed and notable only for as above All other review of systems were negative.  ALLERGIES: Allergies  Allergen Reactions  . Paroxetine     REACTION: Had mild personality change on this medication and doesn't want to take this again.  Marland Kitchen Morphine And Related     Causes bladder problems  . Ciprofloxacin Other (See Comments)    ABDOMINAL PAIN/ sob  . Flagyl [Metronidazole Hcl] Other (See Comments)    ABDOMINAL PAIN/ sob    HOME MEDICATIONS: Current Outpatient Medications  Medication Sig Dispense Refill  . albuterol (PROAIR HFA) 108 (90 Base) MCG/ACT inhaler Inhale 2  puffs into the lungs every 6 (six) hours as needed for wheezing or shortness of breath. 18 g 2  . aspirin 81 MG tablet Take 81 mg by mouth daily.     . B-D 3CC LUER-LOK SYR 18GX1-1/2 18G X 1-1/2" 3 ML MISC USE ONCE A WEEK AS DIRECTED  2  . fenofibrate micronized (LOFIBRA) 67 MG capsule Take 1 capsule (67 mg total) by mouth daily before breakfast. 90 capsule 3  . latanoprost (XALATAN) 0.005 % ophthalmic solution Place 1 drop into the right eye at bedtime. 2.5 mL 12  . lisinopril (ZESTRIL) 40 MG tablet Take 1 tablet (40 mg total) by mouth daily. 90 tablet 1  . oxybutynin (DITROPAN) 5 MG tablet Take 5 mg by mouth daily as needed. Overactive bladder    . psyllium (METAMUCIL) 58.6 % powder Take 1 packet by mouth daily.    . rosuvastatin (CRESTOR) 5 MG tablet Take 1 tablet (5 mg total) by mouth daily. 90 tablet 3  . testosterone cypionate (DEPOTESTOSTERONE CYPIONATE) 200 MG/ML injection Inject 0.5 mLs (100 mg total) into the muscle every 7 (seven) days. 10 mL 0   No current facility-administered medications for this visit.     PAST MEDICAL HISTORY: Past Medical History:  Diagnosis Date  . Asthma, mild    as child-only now if has URI  . BPH (benign prostatic hypertrophy)   . BPPV (benign paroxysmal positional vertigo)   . Diverticulosis   . Eczema   .  Fatty liver disease, nonalcoholic   . Frequent nosebleeds    History of  . GERD (gastroesophageal reflux disease)    occ uses TUMS as needed  . Glaucoma   . History of bronchitis   . History of colon polyps   . History of diverticulitis   . History of kidney stones   . Hyperlipemia   . Hypertension   . Influenza A 11/2017  . Memory changes   . OA (osteoarthritis)   . Obesity   . Overactive bladder 2020  . PVCs (premature ventricular contractions)   . Renal calculi right    AND LEFT X1 NON-OBSTRUCTIVE  . Right sided sciatica    Bilateral buttock and thighs  . Simple renal cyst right interpoler, simpler in nature , asymptomatic  per  urologist note (dr dahlstedt)  12-26-2011   BENIGN, has been drained  . Testosterone deficiency   . Wears glasses   . Wears hearing aid     PAST SURGICAL HISTORY: Past Surgical History:  Procedure Laterality Date  . APPENDECTOMY    . bone spur removal     BILATERAL GREAT TOE  . COLONOSCOPY    . CYSTO/ RIGHT RETROGRADE PYELOGRAM/ RIGHT URETER DILATATION/ RIGHT URETEROSCOPY/ LASER LITHOTRIPSY RIGHT RENAL PELVIS AND LOWER POLE CALCULI/ PLACEMENT STENT  01-11-2012  DR DAHLSTEDT   RIGHT RENAL PELVIS AND LOWER POLE CALCULI  . CYSTOSCOPY W/ RETROGRADES  06/02/2012   Procedure: CYSTOSCOPY WITH RETROGRADE PYELOGRAM;  Surgeon: Franchot Gallo, MD;  Location: [facility];  Service: Urology;  Laterality: Right;  . CYSTOSCOPY WITH URETEROSCOPY  06/02/2012   Procedure: CYSTOSCOPY WITH URETEROSCOPY;  Surgeon: Franchot Gallo, MD;  Location: [facility];  Service: Urology;  Laterality: Right;  90 MIN ALSO EXTRACTION OF CALCULI   . EYE SURGERY Bilateral 2020  . LITHOTRIPSY  APPROX.  1998   ESWL  left kidney x 2  . ROTATOR CUFF REPAIR  2012   LEFT SHOULDER  . SHOULDER ARTHROSCOPY WITH ROTATOR CUFF REPAIR AND SUBACROMIAL DECOMPRESSION  09/12/2012   Procedure: SHOULDER ARTHROSCOPY WITH ROTATOR CUFF REPAIR AND SUBACROMIAL DECOMPRESSION;  Surgeon: Nita Sells, MD;  Location: [facility];  Service: Orthopedics;  Laterality: Right;  see above and biceps tendonotomy  . TRANSURETHRAL RESECTION OF BLADDER NECK N/A 04/08/2018   Procedure: TRANSURETHRAL RESECTION OF BLADDER NECK CONTRACTURE;  Surgeon: Franchot Gallo, MD;  Location: [facility];  Service: Urology;  Laterality: N/A;  . TRANSURETHRAL RESECTION OF PROSTATE  11-29-2009   BPH  . VASECTOMY  1970'S   GEN. ANES.    FAMILY HISTORY: Family History  Problem Relation Age of Onset  . Cancer Mother        breast and kidney   . Squamous cell carcinoma Mother   . Cancer Father         lung  . Heart disease Father     SOCIAL HISTORY: Social History   Socioeconomic History  . Marital status: Married    Spouse name: Not on file  . Number of children: 3  . Years of education: college  . Highest education level: Bachelor's degree (e.g., BA, AB, BS)  Occupational History  . Occupation: Industrial/product designer: [employer]  . Financial resource strain: Not hard at all  . Food insecurity    Worry: Never true    Inability: Never true  . Transportation needs    Medical: No    Non-medical: No  Tobacco Use  .  Smoking status: Former Smoker    Years: 8.00    Types: Cigarettes    Quit date: 01/05/1967    Years since quitting: 52.5  . Smokeless tobacco: Never Used  . Tobacco comment: QUIT AGE 42  Substance and Sexual Activity  . Alcohol use: Yes    Alcohol/week: 0.0 standard drinks    Comment: OCCASIONAL  . Drug use: No  . Sexual activity: Yes  Lifestyle  . Physical activity    Days per week: 0 days    Minutes per session: 0 min  . Stress: Not at all  Relationships  . Social Herbalist on phone: Not on file    Gets together: Not on file    Attends religious service: Not on file    Active member of club or organization: Not on file    Attends meetings of clubs or organizations: Not on file    Relationship status: Not on file  . Intimate partner violence    Fear of current or ex partner: No    Emotionally abused: No    Physically abused: No    Forced sexual activity: No  Other Topics Concern  . Not on file  Social History Narrative   regular exercise    Full code, (Reviewed 2014)      Lives at home with his wife.   Right-handed.   Caffeine use: 4 cups per day.     PHYSICAL EXAM   Vitals:   07/04/19 1055  Temp: 97.6 F (36.4 C)  Weight: 221 lb 8 oz (100.5 kg)  Height: 5' 9.25" (1.759 m)    Not recorded      Body mass index is 32.47 kg/m.  PHYSICAL EXAMNIATION:  Gen: NAD, conversant, well  nourised, well groomed                     Cardiovascular: Regular rate rhythm, no peripheral edema, warm, nontender. Eyes: Conjunctivae clear without exudates or hemorrhage Neck: Supple, no carotid bruits. Pulmonary: Clear to auscultation bilaterally   NEUROLOGICAL EXAM:  MMSE - Mini Mental State Exam 07/04/2019 06/15/2019 04/22/2018  Orientation to time 5 5 5   Orientation to Place 5 5 5   Registration 3 3 3   Attention/ Calculation 5 5 0  Recall 2 3 3   Language- name 2 objects 2 - 0  Language- repeat 1 1 1   Language- follow 3 step command 3 - 3  Language- read & follow direction 1 - 0  Write a sentence 1 - 0  Copy design 1 - 0  Total score 29 - 20  Animal naming is 14   CRANIAL NERVES: CN II: Visual fields are full to confrontation.  Pupils are round equal and briskly reactive to light. CN III, IV, VI: extraocular movement are normal. No ptosis. CN V: Facial sensation is intact to pinprick in all 3 divisions bilaterally. Corneal responses are intact.  CN VII: Face is symmetric with normal eye closure and smile. CN VIII: Hearing is normal to causal conversation. CN IX, X: Palate elevates symmetrically. Phonation is normal. CN XI: Head turning and shoulder shrug are intact CN XII: Tongue is midline with normal movements and no atrophy.  MOTOR: There is no pronator drift of out-stretched arms. Muscle bulk and tone are normal. Muscle strength is normal.  REFLEXES: Reflexes are 2+ and symmetric at the biceps, triceps, knees, and ankles. Plantar responses are flexor.  SENSORY: Intact to light touch, pinprick, positional sensation and vibratory sensation are  intact in fingers and toes.  COORDINATION: Rapid alternating movements and fine finger movements are intact. There is no dysmetria on finger-to-nose and heel-knee-shin.    GAIT/STANCE: Posture is normal. Gait is steady with normal steps, base, arm swing, and turning.  Mild difficulty with tandem walking  DIAGNOSTIC DATA  (LABS, IMAGING, TESTING) - I reviewed patient records, labs, notes, testing and imaging myself where available.   ASSESSMENT AND PLAN  Jack Norris is a 78 y.o. male   Mild cognitive impairment  Mini-Mental Status Examination 29/30 Sensory neuronal hearing loss, right worse than left  MRI of the brain with thin cuts through internal acoustic canal, with without contrast  Laboratory evaluations   Jack Norris, M.D. Ph.D.  [facility] [address], [place], [place] [address] Ph: [phone] Fax: [phone]  CC: Referring Provider

## 2019-07-04 NOTE — Telephone Encounter (Signed)
Open in error

## 2019-07-05 LAB — TSH: TSH: 0.792 u[IU]/mL (ref 0.450–4.500)

## 2019-07-05 LAB — VITAMIN B12: Vitamin B-12: 277 pg/mL (ref 232–1245)

## 2019-07-08 DIAGNOSIS — G4733 Obstructive sleep apnea (adult) (pediatric): Secondary | ICD-10-CM | POA: Diagnosis not present

## 2019-08-07 DIAGNOSIS — G4733 Obstructive sleep apnea (adult) (pediatric): Secondary | ICD-10-CM | POA: Diagnosis not present

## 2019-08-09 ENCOUNTER — Other Ambulatory Visit: Payer: Self-pay

## 2019-08-09 ENCOUNTER — Ambulatory Visit
Admission: RE | Admit: 2019-08-09 | Discharge: 2019-08-09 | Disposition: A | Discharge status: Home / Self Care | Payer: Medicare HMO | Source: Ambulatory Visit | Attending: Neurology | Admitting: Neurology

## 2019-08-09 DIAGNOSIS — H903 Sensorineural hearing loss, bilateral: Secondary | ICD-10-CM | POA: Diagnosis not present

## 2019-08-09 MED ORDER — GADOBENATE DIMEGLUMINE 529 MG/ML IV SOLN
20.0000 mL | Freq: Once | INTRAVENOUS | Status: AC | PRN
  Administered 2019-08-09: 20 mL via INTRAVENOUS

## 2019-08-28 DIAGNOSIS — G4733 Obstructive sleep apnea (adult) (pediatric): Secondary | ICD-10-CM | POA: Diagnosis not present

## 2019-08-28 DIAGNOSIS — I10 Essential (primary) hypertension: Secondary | ICD-10-CM | POA: Diagnosis not present

## 2019-08-31 ENCOUNTER — Other Ambulatory Visit: Payer: Self-pay

## 2019-08-31 MED ORDER — ROSUVASTATIN CALCIUM 5 MG PO TABS: 5.0000 mg | ORAL_TABLET | Freq: Every day | ORAL | 2 refills | Status: DC

## 2019-09-07 DIAGNOSIS — G4733 Obstructive sleep apnea (adult) (pediatric): Secondary | ICD-10-CM | POA: Diagnosis not present

## 2019-09-13 NOTE — Progress Notes (Signed)
Cardiology Office Note   Date:  09/15/2019   ID:  Jack Norris, DOB 1940-12-26, MRN [id number]  PCP:  Jinny Sanders, MD    No chief complaint on file.  CAD  Wt Readings from Last 3 Encounters:  09/15/19 226 lb (102.5 kg)  07/04/19 221 lb 8 oz (100.5 kg)  06/16/19 220 lb 8 oz (100 kg)       History of Present Illness: Jack Norris is a 79 y.o. male With hyperlipidemia. He has been on lopid for years.   He has had palpitations in the past. No management with a cardiologist at that time. Sx have resolved.  He has lost weightin the past.  His father had CAD, CABG x 4, chronic smoker. Mother passed away from cancer at 95.   Stress test in 2018:Blood pressure demonstrated a hypertensive response to exercise.  There was no ST segment deviation noted during stress.  No ischemia. Very frequent monomorphic PVCs during peak stress and occasional PVCs in recovery. Normal exercise tolerance. Hypertensive response to exercise with max blood pressure 213/87 mmHg.  He had the flu in early 2019.  In late 2019, he had some chest discomfort while at the dentist.   He underwent CT Angie of the coronary arteries.  FFR in all 3 coronary distributions was essentially negative, with the exception of the very distal LAD.  However, no stenosis was identified by angiography.  There was suspicion of a PDA lesion of significance, but not mention to be significant by FFR.  At that point, we told him to watch for symptoms.  Any further symptoms would have [place] perform coronary angiography.  Since the last visit, he has done well.  He has gained weight.  He has not been exercising much.  BP has been high.  He saw his PMD.    He had an MRI of the brain in 07/2019, which was unremarkable.   BP was better controlled after the lisinopril was increased, but it has been high even at the PMD visit in 06/16/2019.    Past Medical History:  Diagnosis Date  . Asthma, mild      as child-only now if has URI  . BPH (benign prostatic hypertrophy)   . BPPV (benign paroxysmal positional vertigo)   . Diverticulosis   . Eczema   . Fatty liver disease, nonalcoholic   . Frequent nosebleeds    History of  . GERD (gastroesophageal reflux disease)    occ uses TUMS as needed  . Glaucoma   . History of bronchitis   . History of colon polyps   . History of diverticulitis   . History of kidney stones   . Hyperlipemia   . Hypertension   . Influenza A 11/2017  . Memory changes   . OA (osteoarthritis)   . Obesity   . Overactive bladder 2020  . PVCs (premature ventricular contractions)   . Renal calculi right    AND LEFT X1 NON-OBSTRUCTIVE  . Right sided sciatica    Bilateral buttock and thighs  . Simple renal cyst right interpoler, simpler in nature , asymptomatic  per urologist note (dr dahlstedt)  12-26-2011   BENIGN, has been drained  . Testosterone deficiency   . Wears glasses   . Wears hearing aid     Past Surgical History:  Procedure Laterality Date  . APPENDECTOMY    . bone spur removal     BILATERAL GREAT TOE  . COLONOSCOPY    .  CYSTO/ RIGHT RETROGRADE PYELOGRAM/ RIGHT URETER DILATATION/ RIGHT URETEROSCOPY/ LASER LITHOTRIPSY RIGHT RENAL PELVIS AND LOWER POLE CALCULI/ PLACEMENT STENT  01-11-2012  DR DAHLSTEDT   RIGHT RENAL PELVIS AND LOWER POLE CALCULI  . CYSTOSCOPY W/ RETROGRADES  06/02/2012   Procedure: CYSTOSCOPY WITH RETROGRADE PYELOGRAM;  Surgeon: Franchot Gallo, MD;  [facility]: [facility];  Service: Urology;  Laterality: Right;  . CYSTOSCOPY WITH URETEROSCOPY  06/02/2012   Procedure: CYSTOSCOPY WITH URETEROSCOPY;  Surgeon: Franchot Gallo, MD;  Location: [facility];  Service: Urology;  Laterality: Right;  90 MIN ALSO EXTRACTION OF CALCULI   . EYE SURGERY Bilateral 2020  . LITHOTRIPSY  APPROX.  1998   ESWL  left kidney x 2  . ROTATOR CUFF REPAIR  2012   LEFT SHOULDER  . SHOULDER ARTHROSCOPY WITH ROTATOR  CUFF REPAIR AND SUBACROMIAL DECOMPRESSION  09/12/2012   Procedure: SHOULDER ARTHROSCOPY WITH ROTATOR CUFF REPAIR AND SUBACROMIAL DECOMPRESSION;  Surgeon: Nita Sells, MD;  Location: [place];  Service: Orthopedics;  Laterality: Right;  see above and biceps tendonotomy  . TRANSURETHRAL RESECTION OF BLADDER NECK N/A 04/08/2018   Procedure: TRANSURETHRAL RESECTION OF BLADDER NECK CONTRACTURE;  Surgeon: Franchot Gallo, MD;  Location[facility];  Service: Urology;  Laterality: N/A;  . TRANSURETHRAL RESECTION OF PROSTATE  11-29-2009   BPH  . VASECTOMY  1970'S   GEN. ANES.     Current Outpatient Medications  Medication Sig Dispense Refill  . albuterol (PROAIR HFA) 108 (90 Base) MCG/ACT inhaler Inhale 2 puffs into the lungs every 6 (six) hours as needed for wheezing or shortness of breath. 18 g 2  . aspirin 81 MG tablet Take 81 mg by mouth daily.     . B-D 3CC LUER-LOK SYR 18GX1-1/2 18G X 1-1/2" 3 ML MISC USE ONCE A WEEK AS DIRECTED  2  . fenofibrate micronized (LOFIBRA) 67 MG capsule Take 1 capsule (67 mg total) by mouth daily before breakfast. 90 capsule 3  . latanoprost (XALATAN) 0.005 % ophthalmic solution Place 1 drop into the right eye at bedtime. 2.5 mL 12  . lisinopril (ZESTRIL) 40 MG tablet Take 1 tablet (40 mg total) by mouth daily. 90 tablet 1  . oxybutynin (DITROPAN) 5 MG tablet Take 5 mg by mouth daily as needed. Overactive bladder    . psyllium (METAMUCIL) 58.6 % powder Take 1 packet by mouth daily.    . rosuvastatin (CRESTOR) 5 MG tablet Take 1 tablet (5 mg total) by mouth daily. 90 tablet 2  . testosterone cypionate (DEPOTESTOSTERONE CYPIONATE) 200 MG/ML injection Inject 0.5 mLs (100 mg total) into the muscle every 7 (seven) days. 10 mL 0   No current facility-administered medications for this visit.    Allergies:   Paroxetine, Morphine and related, Ciprofloxacin, and Flagyl [metronidazole hcl]    Social History:  The patient   reports that he quit smoking about 52 years ago. His smoking use included cigarettes. He quit after 8.00 years of use. He has never used smokeless tobacco. He reports current alcohol use. He reports that he does not use drugs.   Family History:  The patient's family history includes Cancer in his father and mother; Heart disease in his father; Squamous cell carcinoma in his mother.    ROS:  Please see the history of present illness.   Otherwise, review of systems are positive for weight gain.   All other systems are reviewed and negative.    PHYSICAL EXAM: VS:  BP Marland Kitchen)  162/90   Pulse 87   Ht 5' 9.25" (1.759 m)   Wt 226 lb (102.5 kg)   SpO2 95%   BMI 33.13 kg/m  , BMI Body mass index is 33.13 kg/m. GEN: Well nourished, well developed, in no acute distress  HEENT: normal  Neck: no JVD, carotid bruits, or masses Cardiac: RRR; no murmurs, rubs, or gallops,no edema  Respiratory:  clear to auscultation bilaterally, normal work of breathing GI: soft, nontender, nondistended, + BS MS: no deformity or atrophy  Skin: warm and dry, no rash Neuro:  Strength and sensation are intact Psych: euthymic mood, full affect   EKG:   The ekg ordered today demonstrates NSR, IVCD   Recent Labs: 06/09/2019: ALT 40; BUN 17; Creatinine, Ser 1.23; Potassium 4.2; Sodium 143 07/04/2019: TSH 0.792   Lipid Panel    Component Value Date/Time   CHOL 145 06/09/2019 0755   CHOL 126 12/12/2018 0810   TRIG 83.0 06/09/2019 0755   HDL 50.30 06/09/2019 0755   HDL 44 12/12/2018 0810   CHOLHDL 3 06/09/2019 0755   VLDL 16.6 06/09/2019 0755   LDLCALC 78 06/09/2019 0755   LDLCALC 67 12/12/2018 0810   LDLDIRECT 107.6 12/14/2011 0954     Other studies Reviewed: Additional studies/ records that were reviewed today with results demonstrating: .   ASSESSMENT AND PLAN:  1.   CAD: Moderate by CT.  No clear angina.  Continue aggressive secondary prevention.  2.   Obesity: Weight gain related to dietary  indiscretion 3. Hyperlipidemia: LDL 78.  Continue rosuvastatin.   HTN: Add amlodipine 5 mg daily. COntinue lisinopril.  send BP readings by my chart. May need to increase amlodipne if BP stays high  4.     Current medicines are reviewed at length with the patient today.  The patient concerns regarding his medicines were addressed.  The following changes have been made:  Add amlodipine  Labs/ tests ordered today include:  No orders of the defined types were placed in this encounter.   Recommend 150 minutes/week of aerobic exercise Low fat, low carb, high fiber diet recommended  Disposition:   FU in 6 months- send BP readings by my chart. May need to increase amlodipne if BP stays high   Signed, Larae Grooms, MD  09/15/2019 8:53 AM    [facility] [place], [place], [place]  [address] Phone: [phone]; Fax: [phone]

## 2019-09-15 ENCOUNTER — Encounter: Payer: Self-pay | Admitting: Interventional Cardiology

## 2019-09-15 ENCOUNTER — Ambulatory Visit: Payer: Medicare HMO | Admitting: Interventional Cardiology

## 2019-09-15 ENCOUNTER — Other Ambulatory Visit: Payer: Self-pay

## 2019-09-15 VITALS — BP 162/90 | HR 87 | Ht 69.25 in | Wt 226.0 lb

## 2019-09-15 DIAGNOSIS — E782 Mixed hyperlipidemia: Secondary | ICD-10-CM

## 2019-09-15 DIAGNOSIS — I2510 Atherosclerotic heart disease of native coronary artery without angina pectoris: Secondary | ICD-10-CM

## 2019-09-15 DIAGNOSIS — I10 Essential (primary) hypertension: Secondary | ICD-10-CM

## 2019-09-15 MED ORDER — AMLODIPINE BESYLATE 5 MG PO TABS: 5.0000 mg | ORAL_TABLET | Freq: Every day | ORAL | 3 refills | Status: DC

## 2019-09-15 NOTE — Patient Instructions (Addendum)
Medication Instructions:  Your physician has recommended you make the following change in your medication:   START: amlodipine 5 mg once a day  *If you need a refill on your cardiac medications before your next appointment, please call your pharmacy*  Lab Work: None ordered  If you have labs (blood work) drawn today and your tests are completely normal, you will receive your results only by: Marland Kitchen MyChart Message (if you have MyChart) OR . A paper copy in the mail If you have any lab test that is abnormal or we need to change your treatment, we will call you to review the results.  Testing/Procedures: None ordered  Follow-Up: At [facility], you and your health needs are our priority.  As part of our continuing mission to provide you with exceptional heart care, we have created designated Provider Care Teams.  These Care Teams include your primary Cardiologist (physician) and Advanced Practice Providers (APPs -  Physician Assistants and Nurse Practitioners) who all work together to provide you with the care you need, when you need it.  Your next appointment:   6 month(s)  The format for your next appointment:   In Person  Provider:   You may see Larae Grooms, MD or one of the following Advanced Practice Providers on your designated Care Team:    Melina Copa, PA-C  Ermalinda Barrios, PA-C   Other Instructions Your physician has requested that you regularly monitor and record your blood pressure readings at home. Please use the same machine at the same time of day to check your readings and send your readings through MyChart.  Your provider recommends that you maintain 150 minutes per week of moderate aerobic activity.  Low-Sodium Eating Plan Sodium, which is an element that makes up salt, helps you maintain a healthy balance of fluids in your body. Too much sodium can increase your blood pressure and cause fluid and waste to be held in your body. Your health care provider or  dietitian may recommend following this plan if you have high blood pressure (hypertension), kidney disease, liver disease, or heart failure. Eating less sodium can help lower your blood pressure, reduce swelling, and protect your heart, liver, and kidneys. What are tips for following this plan? General guidelines  Most people on this plan should limit their sodium intake to 1,500-2,000 mg (milligrams) of sodium each day. Reading food labels   The Nutrition Facts label lists the amount of sodium in one serving of the food. If you eat more than one serving, you must multiply the listed amount of sodium by the number of servings.  Choose foods with less than 140 mg of sodium per serving.  Avoid foods with 300 mg of sodium or more per serving. Shopping  Look for lower-sodium products, often labeled as "low-sodium" or "no salt added."  Always check the sodium content even if foods are labeled as "unsalted" or "no salt added".  Buy fresh foods. ? Avoid canned foods and premade or frozen meals. ? Avoid canned, cured, or processed meats  Buy breads that have less than 80 mg of sodium per slice. Cooking  Eat more home-cooked food and less restaurant, buffet, and fast food.  Avoid adding salt when cooking. Use salt-free seasonings or herbs instead of table salt or sea salt. Check with your health care provider or pharmacist before using salt substitutes.  Cook with plant-based oils, such as canola, sunflower, or olive oil. Meal planning  When eating at a restaurant, ask that your  food be prepared with less salt or no salt, if possible.  Avoid foods that contain MSG (monosodium glutamate). MSG is sometimes added to [place] food, bouillon, and some canned foods. What foods are recommended? The items listed may not be a complete list. Talk with your dietitian about what dietary choices are best for you. Grains Low-sodium cereals, including oats, puffed wheat and rice, and shredded wheat.  Low-sodium crackers. Unsalted rice. Unsalted pasta. Low-sodium bread. Whole-grain breads and whole-grain pasta. Vegetables Fresh or frozen vegetables. "No salt added" canned vegetables. "No salt added" tomato sauce and paste. Low-sodium or reduced-sodium tomato and vegetable juice. Fruits Fresh, frozen, or canned fruit. Fruit juice. Meats and other protein foods Fresh or frozen (no salt added) meat, poultry, seafood, and fish. Low-sodium canned tuna and salmon. Unsalted nuts. Dried peas, beans, and lentils without added salt. Unsalted canned beans. Eggs. Unsalted nut butters. Dairy Milk. Soy milk. Cheese that is naturally low in sodium, such as ricotta cheese, fresh mozzarella, or Swiss cheese Low-sodium or reduced-sodium cheese. Cream cheese. Yogurt. Fats and oils Unsalted butter. Unsalted margarine with no trans fat. Vegetable oils such as canola or olive oils. Seasonings and other foods Fresh and dried herbs and spices. Salt-free seasonings. Low-sodium mustard and ketchup. Sodium-free salad dressing. Sodium-free light mayonnaise. Fresh or refrigerated horseradish. Lemon juice. Vinegar. Homemade, reduced-sodium, or low-sodium soups. Unsalted popcorn and pretzels. Low-salt or salt-free chips. What foods are not recommended? The items listed may not be a complete list. Talk with your dietitian about what dietary choices are best for you. Grains Instant hot cereals. Bread stuffing, pancake, and biscuit mixes. Croutons. Seasoned rice or pasta mixes. Noodle soup cups. Boxed or frozen macaroni and cheese. Regular salted crackers. Self-rising flour. Vegetables Sauerkraut, pickled vegetables, and relishes. Olives. [place] fries. Onion rings. Regular canned vegetables (not low-sodium or reduced-sodium). Regular canned tomato sauce and paste (not low-sodium or reduced-sodium). Regular tomato and vegetable juice (not low-sodium or reduced-sodium). Frozen vegetables in sauces. Meats and other protein  foods Meat or fish that is salted, canned, smoked, spiced, or pickled. Bacon, ham, sausage, hotdogs, corned beef, chipped beef, packaged lunch meats, salt pork, jerky, pickled herring, anchovies, regular canned tuna, sardines, salted nuts. Dairy Processed cheese and cheese spreads. Cheese curds. Blue cheese. Feta cheese. String cheese. Regular cottage cheese. Buttermilk. Canned milk. Fats and oils Salted butter. Regular margarine. Ghee. Bacon fat. Seasonings and other foods Onion salt, garlic salt, seasoned salt, table salt, and sea salt. Canned and packaged gravies. Worcestershire sauce. Tartar sauce. Barbecue sauce. Teriyaki sauce. Soy sauce, including reduced-sodium. Steak sauce. Fish sauce. Oyster sauce. Cocktail sauce. Horseradish that you find on the shelf. Regular ketchup and mustard. Meat flavorings and tenderizers. Bouillon cubes. Hot sauce and Tabasco sauce. Premade or packaged marinades. Premade or packaged taco seasonings. Relishes. Regular salad dressings. Salsa. Potato and tortilla chips. Corn chips and puffs. Salted popcorn and pretzels. Canned or dried soups. Pizza. Frozen entrees and pot pies. Summary  Eating less sodium can help lower your blood pressure, reduce swelling, and protect your heart, liver, and kidneys.  Most people on this plan should limit their sodium intake to 1,500-2,000 mg (milligrams) of sodium each day.  Canned, boxed, and frozen foods are high in sodium. Restaurant foods, fast foods, and pizza are also very high in sodium. You also get sodium by adding salt to food.  Try to cook at home, eat more fresh fruits and vegetables, and eat less fast food, canned, processed, or prepared foods. This information is not  intended to replace advice given to you by your health care provider. Make sure you discuss any questions you have with your health care provider. Document Revised: 07/23/2017 Document Reviewed: 08/03/2016 Elsevier Patient Education  2020 [employer].

## 2019-09-21 ENCOUNTER — Other Ambulatory Visit: Payer: Self-pay

## 2019-09-21 DIAGNOSIS — H35373 Puckering of macula, bilateral: Secondary | ICD-10-CM | POA: Diagnosis not present

## 2019-09-21 DIAGNOSIS — H2511 Age-related nuclear cataract, right eye: Secondary | ICD-10-CM | POA: Diagnosis not present

## 2019-09-21 DIAGNOSIS — H43811 Vitreous degeneration, right eye: Secondary | ICD-10-CM | POA: Diagnosis not present

## 2019-09-21 MED ORDER — FENOFIBRATE 67 MG PO CAPS: 67.0000 mg | ORAL_CAPSULE | Freq: Every day | ORAL | 3 refills | Status: DC

## 2019-10-08 DIAGNOSIS — G4733 Obstructive sleep apnea (adult) (pediatric): Secondary | ICD-10-CM | POA: Diagnosis not present

## 2019-10-10 ENCOUNTER — Other Ambulatory Visit: Payer: Self-pay

## 2019-10-10 ENCOUNTER — Encounter: Payer: Self-pay | Admitting: Neurology

## 2019-10-10 ENCOUNTER — Ambulatory Visit: Payer: Medicare HMO | Admitting: Neurology

## 2019-10-10 VITALS — BP 141/73 | HR 88 | Temp 97.5°F | Ht 69.25 in | Wt 228.0 lb

## 2019-10-10 DIAGNOSIS — R413 Other amnesia: Secondary | ICD-10-CM

## 2019-10-10 DIAGNOSIS — H9193 Unspecified hearing loss, bilateral: Secondary | ICD-10-CM | POA: Diagnosis not present

## 2019-10-10 NOTE — Progress Notes (Signed)
PATIENT: Jack Norris DOB: 09-05-1940  Chief Complaint  Patient presents with  . Sensory neuronal hearing loss    He is here with his wife, Charlett Nose. They would like to review his MRI results. No change in memory. His recent MMSE was 29/30 on 07/04/19.     HISTORICAL  Jack Norris is a 79 year old male, seen in request by his primary care physician Dr. Eliezer Lofts for evaluation of memory loss, initial evaluation was on July 05, 2019.  I have reviewed and summarized the referring note from the referring physician.  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  He also complains of occasionally vertigo, unsteady on his feet, such as standing up with 1 foot is hard to keep his balance  He was noted by his family tends to repeat himself, sometimes difficult for him to interpret conversation since 2019.  He was diagnosed with obstructive sleep apnea in January 2020, began to use CPAP machine,  Update October 10, 2019: He is accompanied by his wife at today's clinical visit, we have personally reviewed MRI of the brain with without contrast on August 09, 2019, mild age-related changes, minimal periventricular subcortical gliosis, no acute abnormality  Laboratory evaluation showed normal B12, TSH, lipid panel  He has bilateral hearing loss, right worse than left, no gait abnormality, he has been sedentary in the winter months   REVIEW OF SYSTEMS: Full 14 system review of systems performed and notable only for as above All other review of systems were  negative.  ALLERGIES: Allergies  Allergen Reactions  . Paroxetine     REACTION: Had mild personality change on this medication and doesn't want to take this again.  Marland Kitchen Morphine And Related     Causes bladder problems  . Ciprofloxacin Other (See Comments)    ABDOMINAL PAIN/ sob  . Flagyl [Metronidazole Hcl] Other (See Comments)    ABDOMINAL PAIN/ sob    HOME MEDICATIONS: Current Outpatient Medications  Medication Sig Dispense Refill  . albuterol (PROAIR HFA) 108 (90 Base) MCG/ACT inhaler Inhale 2 puffs into the lungs every 6 (six) hours as needed for wheezing or shortness of breath. 18 g 2  . amLODipine (NORVASC) 5 MG tablet Take 1 tablet (5 mg total) by mouth daily. 90 tablet 3  . aspirin 81 MG tablet Take 81 mg by mouth daily.     . B-D 3CC LUER-LOK SYR 18GX1-1/2 18G X 1-1/2" 3 ML MISC USE ONCE A WEEK AS DIRECTED  2  . fenofibrate micronized (LOFIBRA) 67 MG capsule Take 1 capsule (67 mg total) by mouth daily before breakfast. 90 capsule 3  . latanoprost (XALATAN) 0.005 % ophthalmic solution Place 1 drop into the right eye at bedtime. 2.5 mL 12  . lisinopril (ZESTRIL) 40 MG tablet Take 1 tablet (40 mg total) by mouth daily. 90 tablet 1  . oxybutynin (DITROPAN) 5 MG tablet Take 5 mg by mouth daily as needed. Overactive bladder    . psyllium (METAMUCIL) 58.6 % powder Take 1 packet by mouth daily.    . rosuvastatin (CRESTOR) 5 MG tablet Take 1 tablet (5  mg total) by mouth daily. 90 tablet 2  . testosterone cypionate (DEPOTESTOSTERONE CYPIONATE) 200 MG/ML injection Inject 0.5 mLs (100 mg total) into the muscle every 7 (seven) days. 10 mL 0  . triamcinolone (KENALOG) 0.025 % cream Apply 1 application topically as needed.     No current facility-administered medications for this visit.    PAST MEDICAL HISTORY: Past Medical History:  Diagnosis Date  . Asthma, mild    as child-only now if has URI  . BPH (benign prostatic hypertrophy)   . BPPV (benign paroxysmal positional vertigo)   .  Diverticulosis   . Eczema   . Fatty liver disease, nonalcoholic   . Frequent nosebleeds    History of  . GERD (gastroesophageal reflux disease)    occ uses TUMS as needed  . Glaucoma   . History of bronchitis   . History of colon polyps   . History of diverticulitis   . History of kidney stones   . Hyperlipemia   . Hypertension   . Influenza A 11/2017  . Memory changes   . OA (osteoarthritis)   . Obesity   . Overactive bladder 2020  . PVCs (premature ventricular contractions)   . Renal calculi right    AND LEFT X1 NON-OBSTRUCTIVE  . Right sided sciatica    Bilateral buttock and thighs  . Simple renal cyst right interpoler, simpler in nature , asymptomatic  per urologist note (dr dahlstedt)  12-26-2011   BENIGN, has been drained  . Testosterone deficiency   . Wears glasses   . Wears hearing aid     PAST SURGICAL HISTORY: Past Surgical History:  Procedure Laterality Date  . APPENDECTOMY    . bone spur removal     BILATERAL GREAT TOE  . COLONOSCOPY    . CYSTO/ RIGHT RETROGRADE PYELOGRAM/ RIGHT URETER DILATATION/ RIGHT URETEROSCOPY/ LASER LITHOTRIPSY RIGHT RENAL PELVIS AND LOWER POLE CALCULI/ PLACEMENT STENT  01-11-2012  DR DAHLSTEDT   RIGHT RENAL PELVIS AND LOWER POLE CALCULI  . CYSTOSCOPY W/ RETROGRADES  06/02/2012   Procedure: CYSTOSCOPY WITH RETROGRADE PYELOGRAM;  Surgeon: Franchot Gallo, MD;  Location: [facility];  Service: Urology;  Laterality: Right;  . CYSTOSCOPY WITH URETEROSCOPY  06/02/2012   Procedure: CYSTOSCOPY WITH URETEROSCOPY;  Surgeon: Franchot Gallo, MD;  Location: [facility];  Service: Urology;  Laterality: Right;  90 MIN ALSO EXTRACTION OF CALCULI   . EYE SURGERY Bilateral 2020  . LITHOTRIPSY  APPROX.  1998   ESWL  left kidney x 2  . ROTATOR CUFF REPAIR  2012   LEFT SHOULDER  . SHOULDER ARTHROSCOPY WITH ROTATOR CUFF REPAIR AND SUBACROMIAL DECOMPRESSION  09/12/2012   Procedure: SHOULDER ARTHROSCOPY WITH ROTATOR  CUFF REPAIR AND SUBACROMIAL DECOMPRESSION;  Surgeon: Nita Sells, MD;  Location: [facility];  Service: Orthopedics;  Laterality: Right;  see above and biceps tendonotomy  . TRANSURETHRAL RESECTION OF BLADDER NECK N/A 04/08/2018   Procedure: TRANSURETHRAL RESECTION OF BLADDER NECK CONTRACTURE;  Surgeon: Franchot Gallo, MD;  Location: [facility];  Service: Urology;  Laterality: N/A;  . TRANSURETHRAL RESECTION OF PROSTATE  11-29-2009   BPH  . VASECTOMY  1970'S   GEN. ANES.    FAMILY HISTORY: Family History  Problem Relation Age of Onset  . Cancer Mother        breast and kidney   . Squamous cell carcinoma Mother   . Cancer Father        lung  . Heart disease Father  SOCIAL HISTORY: Social History   Socioeconomic History  . Marital status: Married    Spouse name: Not on file  . Number of children: 3  . Years of education: college  . Highest education level: Bachelor's degree (e.g., BA, AB, BS)  Occupational History  . Occupation: QUOTATION ENGINEER    Employer: [employer]  Tobacco Use  . Smoking status: Former Smoker    Years: 8.00    Types: Cigarettes    Quit date: 01/05/1967    Years since quitting: 52.7  . Smokeless tobacco: Never Used  . Tobacco comment: QUIT AGE 46  Substance and Sexual Activity  . Alcohol use: Yes    Alcohol/week: 0.0 standard drinks    Comment: OCCASIONAL  . Drug use: No  . Sexual activity: Yes  Other Topics Concern  . Not on file  Social History Narrative   regular exercise    Full code, (Reviewed 2014)      Lives at home with his wife.   Right-handed.   Caffeine use: 4 cups per day.   Social Determinants of Health   Financial Resource Strain: Low Risk   . Difficulty of Paying Living Expenses: Not hard at all  Food Insecurity: No Food Insecurity  . Worried About Charity fundraiser in the Last Year: Never true  . Ran Out of Food in the Last Year: Never true  Transportation Needs:  No Transportation Needs  . Lack of Transportation (Medical): No  . Lack of Transportation (Non-Medical): No  Physical Activity: Inactive  . Days of Exercise per Week: 0 days  . Minutes of Exercise per Session: 0 min  Stress: No Stress Concern Present  . Feeling of Stress : Not at all  Social Connections:   . Frequency of Communication with Friends and Family: Not on file  . Frequency of Social Gatherings with Friends and Family: Not on file  . Attends Religious Services: Not on file  . Active Member of Clubs or Organizations: Not on file  . Attends Archivist Meetings: Not on file  . Marital Status: Not on file  Intimate Partner Violence: Not At Risk  . Fear of Current or Ex-Partner: No  . Emotionally Abused: No  . Physically Abused: No  . Sexually Abused: No     PHYSICAL EXAM   Vitals:   10/10/19 0854  BP: (!) 141/73  Pulse: 88  Temp: (!) 97.5 F (36.4 C)  Weight: 228 lb (103.4 kg)  Height: 5' 9.25" (1.759 m)    Not recorded      Body mass index is 33.43 kg/m.  PHYSICAL EXAMNIATION:  Gen: NAD, conversant, well nourised, well groomed                     Cardiovascular: Regular rate rhythm, no peripheral edema, warm, nontender. Eyes: Conjunctivae clear without exudates or hemorrhage Neck: Supple, no carotid bruits. Pulmonary: Clear to auscultation bilaterally   NEUROLOGICAL EXAM:  MMSE - Mini Mental State Exam 07/04/2019 06/15/2019 04/22/2018  Orientation to time 5 5 5   Orientation to Place 5 5 5   Registration 3 3 3   Attention/ Calculation 5 5 0  Recall 2 3 3   Language- name 2 objects 2 - 0  Language- repeat 1 1 1   Language- follow 3 step command 3 - 3  Language- read & follow direction 1 - 0  Write a sentence 1 - 0  Copy design 1 - 0  Total score 29 - 20  Animal  naming is 14   CRANIAL NERVES: CN II: Visual fields are full to confrontation.  Pupils are round equal and briskly reactive to light. CN III, IV, VI: extraocular movement are  normal. No ptosis. CN V: Facial sensation is intact to pinprick in all 3 divisions bilaterally. Corneal responses are intact.  CN VII: Face is symmetric with normal eye closure and smile. CN VIII: Mildly hard of hearing CN IX, X: Palate elevates symmetrically. Phonation is normal. CN XI: Head turning and shoulder shrug are intact CN XII: Tongue is midline with normal movements and no atrophy.  MOTOR: There is no pronator drift of out-stretched arms. Muscle bulk and tone are normal. Muscle strength is normal.  REFLEXES: Reflexes are 2+ and symmetric at the biceps, triceps, knees, and ankles. Plantar responses are flexor.  SENSORY: Intact to light touch, pinprick, positional sensation and vibratory sensation are intact in fingers and toes.  COORDINATION: Rapid alternating movements and fine finger movements are intact. There is no dysmetria on finger-to-nose and heel-knee-shin.    GAIT/STANCE: Posture is normal. Gait is steady with normal steps, base, arm swing, and turning.  Mild difficulty with tandem walking  DIAGNOSTIC DATA (LABS, IMAGING, TESTING) - I reviewed patient records, labs, notes, testing and imaging myself where available.   ASSESSMENT AND PLAN  ZAHMARI MILKEY is a 79 y.o. male   Mild cognitive impairment Sensory neuronal hearing loss, right worse than left  Mini-Mental Status Examination 29/30  MRI of the brain with thin cuts through internal acoustic canal, with without contrast showed no significant abnormality  Laboratory showed normal TSH, B12, no treatable etiology identified  I have advised him continue moderate exercise, only return to clinic for new issues   Marcial Pacas, M.D. Ph.D.  [facility] [address] [place], [place] [address] Ph: [phone] Fax: [phone]  CC: Referring Provider

## 2019-10-13 ENCOUNTER — Other Ambulatory Visit: Payer: Self-pay | Admitting: Interventional Cardiology

## 2019-10-13 DIAGNOSIS — I10 Essential (primary) hypertension: Secondary | ICD-10-CM

## 2019-10-13 DIAGNOSIS — Z79899 Other long term (current) drug therapy: Secondary | ICD-10-CM

## 2019-10-13 MED ORDER — LISINOPRIL 40 MG PO TABS: 40.0000 mg | ORAL_TABLET | Freq: Every day | ORAL | 3 refills | Status: DC

## 2019-11-01 DIAGNOSIS — L570 Actinic keratosis: Secondary | ICD-10-CM | POA: Diagnosis not present

## 2019-11-01 DIAGNOSIS — B351 Tinea unguium: Secondary | ICD-10-CM | POA: Diagnosis not present

## 2019-11-02 LAB — HEPATIC FUNCTION PANEL
ALT: 64 — AB (ref 10–40)
AST: 38 (ref 14–40)
Alkaline Phosphatase: 142 — AB (ref 25–125)
Bilirubin, Direct: 0.2 (ref 0.01–0.4)
Bilirubin, Total: 0.6

## 2019-11-09 ENCOUNTER — Telehealth: Payer: Self-pay | Admitting: *Deleted

## 2019-11-09 NOTE — Telephone Encounter (Signed)
Sonia Side notified by telephone of elevated liver enzymes.  He states he just finished an antibiotic about a week that was prescribed by his dentist and the cardiologist started amlodipine 5 mg in January 2021. He states he has put on some weight.  He states he was already contacted by the dermatologist and they are going to repeat the hepatic panel in 1 month.  In the meantime he will stop ETOH intake, which he reports as rare, and tylenol.  FYI to Dr. Diona Browner.

## 2019-11-09 NOTE — Telephone Encounter (Signed)
Pt returned your call  Best number [phone]

## 2019-11-09 NOTE — Telephone Encounter (Addendum)
Received lab results from [facility] showing [name] (142 IU/L) and ALT (64 IU/L) elevated.  Per Dr. Diona Browner, patient needs to stop ETOH and tylenol.  Also need to find out if he is taking any new medications.   Left message for Sonia Side to return my call to discuss these lab results.

## 2019-11-20 DIAGNOSIS — H90A21 Sensorineural hearing loss, unilateral, right ear, with restricted hearing on the contralateral side: Secondary | ICD-10-CM | POA: Diagnosis not present

## 2019-11-20 DIAGNOSIS — H6123 Impacted cerumen, bilateral: Secondary | ICD-10-CM | POA: Diagnosis not present

## 2019-11-20 DIAGNOSIS — R42 Dizziness and giddiness: Secondary | ICD-10-CM | POA: Diagnosis not present

## 2019-11-27 DIAGNOSIS — G4733 Obstructive sleep apnea (adult) (pediatric): Secondary | ICD-10-CM | POA: Diagnosis not present

## 2019-12-07 DIAGNOSIS — H40051 Ocular hypertension, right eye: Secondary | ICD-10-CM | POA: Diagnosis not present

## 2019-12-07 DIAGNOSIS — H43391 Other vitreous opacities, right eye: Secondary | ICD-10-CM | POA: Diagnosis not present

## 2019-12-07 DIAGNOSIS — H43811 Vitreous degeneration, right eye: Secondary | ICD-10-CM | POA: Diagnosis not present

## 2019-12-07 DIAGNOSIS — Z9889 Other specified postprocedural states: Secondary | ICD-10-CM | POA: Diagnosis not present

## 2019-12-07 DIAGNOSIS — H26493 Other secondary cataract, bilateral: Secondary | ICD-10-CM | POA: Diagnosis not present

## 2019-12-07 DIAGNOSIS — Z961 Presence of intraocular lens: Secondary | ICD-10-CM | POA: Diagnosis not present

## 2019-12-07 DIAGNOSIS — H35371 Puckering of macula, right eye: Secondary | ICD-10-CM | POA: Diagnosis not present

## 2020-01-04 ENCOUNTER — Ambulatory Visit (INDEPENDENT_AMBULATORY_CARE_PROVIDER_SITE_OTHER): Payer: Medicare HMO | Admitting: Family Medicine

## 2020-01-04 ENCOUNTER — Other Ambulatory Visit: Payer: Self-pay

## 2020-01-04 ENCOUNTER — Encounter: Payer: Self-pay | Admitting: Family Medicine

## 2020-01-04 VITALS — BP 130/78 | HR 89 | Temp 98.0°F | Ht 69.25 in | Wt 225.0 lb

## 2020-01-04 DIAGNOSIS — Z6832 Body mass index (BMI) 32.0-32.9, adult: Secondary | ICD-10-CM | POA: Diagnosis not present

## 2020-01-04 DIAGNOSIS — E6609 Other obesity due to excess calories: Secondary | ICD-10-CM

## 2020-01-04 LAB — T3, FREE: T3, Free: 3.1 pg/mL (ref 2.3–4.2)

## 2020-01-04 LAB — TSH: TSH: 0.86 u[IU]/mL (ref 0.35–4.50)

## 2020-01-04 LAB — T4, FREE: Free T4: 0.92 ng/dL (ref 0.60–1.60)

## 2020-01-04 NOTE — Progress Notes (Signed)
Chief Complaint  Patient presents with  . Weight Gain    MyChart message sent about concerns he is having about his weight. Made OV to discuss.    History of Present Illness: HPI   79 year old male presents for discussion on weihgt management.  Wt Readings from Last 3 Encounters:  01/04/20 225 lb (102.1 kg)  10/10/19 228 lb (103.4 kg)  09/15/19 226 lb (102.5 kg)  Body mass index is 32.99 kg/m.  He has been having fluctuating weights. He has tried [employer].  He has been less active with COVID19.  Minimal exercise. Low motivation... trouble making diet changes and to exercise.  he is under stress.  He has gained weight 15 lbs in last few months.  He has noted he is colder all the time. NML TSH 6 months ago. Some decrease in energy.  OAB followed by urologist. Low testosterone.. he is currently taking weekly testosterone ,occ missing doses. 2019 nml total testosterone.   This visit occurred during the SARS-CoV-2 public health emergency.  Safety protocols were in place, including screening questions prior to the visit, additional usage of staff PPE, and extensive cleaning of exam room while observing appropriate contact time as indicated for disinfecting solutions.   COVID 19 screen:  No recent travel or known exposure to COVID19 The patient denies respiratory symptoms of COVID 19 at this time. The importance of social distancing was discussed today.     Review of Systems  Constitutional: Negative for chills and fever.  HENT: Negative for congestion and ear pain.   Eyes: Negative for pain and redness.  Respiratory: Negative for cough and shortness of breath.   Cardiovascular: Negative for chest pain, palpitations and leg swelling.  Gastrointestinal: Negative for abdominal pain, blood in stool, constipation, diarrhea, nausea and vomiting.  Genitourinary: Negative for dysuria.  Musculoskeletal: Negative for falls and myalgias.  Skin: Negative for rash.   Neurological: Negative for dizziness.  Psychiatric/Behavioral: Negative for depression. The patient is not nervous/anxious.       Past Medical History:  Diagnosis Date  . Asthma, mild    as child-only now if has URI  . BPH (benign prostatic hypertrophy)   . BPPV (benign paroxysmal positional vertigo)   . Diverticulosis   . Eczema   . Fatty liver disease, nonalcoholic   . Frequent nosebleeds    History of  . GERD (gastroesophageal reflux disease)    occ uses TUMS as needed  . Glaucoma   . History of bronchitis   . History of colon polyps   . History of diverticulitis   . History of kidney stones   . Hyperlipemia   . Hypertension   . Influenza A 11/2017  . Memory changes   . OA (osteoarthritis)   . Obesity   . Overactive bladder 2020  . PVCs (premature ventricular contractions)   . Renal calculi right    AND LEFT X1 NON-OBSTRUCTIVE  . Right sided sciatica    Bilateral buttock and thighs  . Simple renal cyst right interpoler, simpler in nature , asymptomatic  per urologist note (dr dahlstedt)  12-26-2011   BENIGN, has been drained  . Testosterone deficiency   . Wears glasses   . Wears hearing aid     reports that he quit smoking about 53 years ago. His smoking use included cigarettes. He quit after 8.00 years of use. He has never used smokeless tobacco. He reports current alcohol use. He reports that he does not use drugs.  Current Outpatient Medications:  .  albuterol (PROAIR HFA) 108 (90 Base) MCG/ACT inhaler, Inhale 2 puffs into the lungs every 6 (six) hours as needed for wheezing or shortness of breath., Disp: 18 g, Rfl: 2 .  amLODipine (NORVASC) 5 MG tablet, Take 1 tablet (5 mg total) by mouth daily., Disp: 90 tablet, Rfl: 3 .  aspirin 81 MG tablet, Take 81 mg by mouth daily. , Disp: , Rfl:  .  B-D 3CC LUER-LOK SYR 18GX1-1/2 18G X 1-1/2" 3 ML MISC, USE ONCE A WEEK AS DIRECTED, Disp: , Rfl: 2 .  fenofibrate micronized (LOFIBRA) 67 MG capsule, Take 1 capsule (67 mg  total) by mouth daily before breakfast., Disp: 90 capsule, Rfl: 3 .  latanoprost (XALATAN) 0.005 % ophthalmic solution, Place 1 drop into the right eye at bedtime., Disp: 2.5 mL, Rfl: 12 .  lisinopril (ZESTRIL) 40 MG tablet, Take 1 tablet (40 mg total) by mouth daily., Disp: 90 tablet, Rfl: 3 .  oxybutynin (DITROPAN) 5 MG tablet, Take 5 mg by mouth daily as needed. Overactive bladder, Disp: , Rfl:  .  psyllium (METAMUCIL) 58.6 % powder, Take 1 packet by mouth daily., Disp: , Rfl:  .  rosuvastatin (CRESTOR) 5 MG tablet, Take 1 tablet (5 mg total) by mouth daily., Disp: 90 tablet, Rfl: 2 .  testosterone cypionate (DEPOTESTOSTERONE CYPIONATE) 200 MG/ML injection, Inject 0.5 mLs (100 mg total) into the muscle every 7 (seven) days., Disp: 10 mL, Rfl: 0 .  triamcinolone (KENALOG) 0.025 % cream, Apply 1 application topically as needed., Disp: , Rfl:    Observations/Objective: Blood pressure 130/78, pulse 89, temperature 98 F (36.7 C), height 5' 9.25" (1.759 m), weight 225 lb (102.1 kg), SpO2 96 %.  Physical Exam Constitutional:      Appearance: He is well-developed. He is obese.  HENT:     Head: Normocephalic.     Right Ear: Hearing normal.     Left Ear: Hearing normal.     Nose: Nose normal.  Neck:     Thyroid: No thyroid mass or thyromegaly.     Vascular: No carotid bruit.     Trachea: Trachea normal.  Cardiovascular:     Rate and Rhythm: Normal rate and regular rhythm.     Pulses: Normal pulses.     Heart sounds: Heart sounds not distant. No murmur. No friction rub. No gallop.      Comments: No peripheral edema Pulmonary:     Effort: Pulmonary effort is normal. No respiratory distress.     Breath sounds: Normal breath sounds.  Skin:    General: Skin is warm and dry.     Findings: No rash.  Psychiatric:        Speech: Speech normal.        Behavior: Behavior normal.        Thought Content: Thought content normal.      Assessment and Plan   OBESITY Discussed weight loss  option in detail. Will eval for secondary causes.Marland Kitchen eval thyroid given cold intolerance.  Start lifestyle changes. Set goals in Patient instruction.  Discussed trulicity as an option.  Close follow up in 1 month.      Eliezer Lofts, MD

## 2020-01-04 NOTE — Patient Instructions (Addendum)
Please stop at the lab to have labs drawn. Discussed testosterone with urologist at next [place]. Increase exercise 3- 5 day a week.. walking. Check weight daily, first thing AM. Decrease bread. Only 3 days a week have bedtime snack. Increase water intake 48 oz daily.Marland Kitchen

## 2020-01-04 NOTE — Assessment & Plan Note (Signed)
Discussed weight loss option in detail. Will eval for secondary causes.Marland Kitchen eval thyroid given cold intolerance.  Start lifestyle changes. Set goals in Patient instruction.  Discussed trulicity as an option.  Close follow up in 1 month.

## 2020-01-09 DIAGNOSIS — M2669 Other specified disorders of temporomandibular joint: Secondary | ICD-10-CM | POA: Diagnosis not present

## 2020-01-09 DIAGNOSIS — F458 Other somatoform disorders: Secondary | ICD-10-CM | POA: Diagnosis not present

## 2020-01-18 DIAGNOSIS — F458 Other somatoform disorders: Secondary | ICD-10-CM | POA: Diagnosis not present

## 2020-01-18 DIAGNOSIS — M2669 Other specified disorders of temporomandibular joint: Secondary | ICD-10-CM | POA: Diagnosis not present

## 2020-02-02 DIAGNOSIS — R3915 Urgency of urination: Secondary | ICD-10-CM | POA: Diagnosis not present

## 2020-02-02 DIAGNOSIS — N401 Enlarged prostate with lower urinary tract symptoms: Secondary | ICD-10-CM | POA: Diagnosis not present

## 2020-02-02 DIAGNOSIS — R948 Abnormal results of function studies of other organs and systems: Secondary | ICD-10-CM | POA: Diagnosis not present

## 2020-02-02 DIAGNOSIS — E291 Testicular hypofunction: Secondary | ICD-10-CM | POA: Diagnosis not present

## 2020-03-14 NOTE — Progress Notes (Signed)
Cardiology Office Note   Date:  03/15/2020   ID:  [name], DOB October 18, 1940, MRN [id number]  PCP:  Jinny Sanders, MD    No chief complaint on file.  CAD  Wt Readings from Last 3 Encounters:  03/15/20 (!) 222 lb (100.7 kg)  01/04/20 225 lb (102.1 kg)  10/10/19 228 lb (103.4 kg)       History of Present Illness: Jack Norris is a 79 y.o. male  With hyperlipidemia. He has been on lopid for years.   He has had palpitations in the past. No management with a cardiologist at that time. Sx have resolved.  He has lost weightin the past.  His father had CAD, CABG x 4, chronic smoker. Mother passed away from cancer at 95.   Stress test in 2018:Blood pressure demonstrated a hypertensive response to exercise.  There was no ST segment deviation noted during stress.  No ischemia. Very frequent monomorphic PVCs during peak stress and occasional PVCs in recovery. Normal exercise tolerance. Hypertensive response to exercise with max blood pressure 213/87 mmHg.  He had the flu in early 2019.  In late 2019, he had some chest discomfort while at the dentist. He underwent CT Angie of the coronary arteries. FFR in all 3 coronary distributions was essentially negative,with the exception of the very distal LAD. However, no stenosis was identified by angiography. There was suspicion of a PDA lesion of significance,but not mention to be significant by FFR. At that point, we told him to watch for symptoms. Any further symptoms would have [place] perform coronary angiography.  In 2020, it was noted that he has gained weight.  He has not been exercising much.  BP was high.  He saw his PMD.    He had an MRI of the brain in 07/2019, which was unremarkable.   BP was better controlled after the lisinopril was increased.  Since the last visit, he has had some neck pain when turns his head a certain way.  He has had issues with grinding his teeth as well.  He  had botox to help with this in early in early 2021  Denies : Chest pain. Dizziness. Leg edema. Nitroglycerin use. Orthopnea. Palpitations. Paroxysmal nocturnal dyspnea. Shortness of breath. Syncope.   He has been travelling in the past few weeks.  His walking dropped off.  Prior to that, he has been met with a trainer, and will be starting work with them in a week.   Received vaccines in early 2021.   Past Medical History:  Diagnosis Date  . Asthma, mild    as child-only now if has URI  . BPH (benign prostatic hypertrophy)   . BPPV (benign paroxysmal positional vertigo)   . Diverticulosis   . Eczema   . Fatty liver disease, nonalcoholic   . Frequent nosebleeds    History of  . GERD (gastroesophageal reflux disease)    occ uses TUMS as needed  . Glaucoma   . History of bronchitis   . History of colon polyps   . History of diverticulitis   . History of kidney stones   . Hyperlipemia   . Hypertension   . Influenza A 11/2017  . Memory changes   . OA (osteoarthritis)   . Obesity   . Overactive bladder 2020  . PVCs (premature ventricular contractions)   . Renal calculi right    AND LEFT X1 NON-OBSTRUCTIVE  . Right sided sciatica    Bilateral buttock and thighs  .  Simple renal cyst right interpoler, simpler in nature , asymptomatic  per urologist note (dr dahlstedt)  12-26-2011   BENIGN, has been drained  . Testosterone deficiency   . Wears glasses   . Wears hearing aid     Past Surgical History:  Procedure Laterality Date  . APPENDECTOMY    . bone spur removal     BILATERAL GREAT TOE  . COLONOSCOPY    . CYSTO/ RIGHT RETROGRADE PYELOGRAM/ RIGHT URETER DILATATION/ RIGHT URETEROSCOPY/ LASER LITHOTRIPSY RIGHT RENAL PELVIS AND LOWER POLE CALCULI/ PLACEMENT STENT  01-11-2012  DR DAHLSTEDT   RIGHT RENAL PELVIS AND LOWER POLE CALCULI  . CYSTOSCOPY W/ RETROGRADES  06/02/2012   Procedure: CYSTOSCOPY WITH RETROGRADE PYELOGRAM;  Surgeon: Franchot Gallo, MD;  [facility];  Service: Urology;  Laterality: Right;  . CYSTOSCOPY WITH URETEROSCOPY  06/02/2012   Procedure: CYSTOSCOPY WITH URETEROSCOPY;  Surgeon: Franchot Gallo, MD;  Location: [facility];  Service: Urology;  Laterality: Right;  90 MIN ALSO EXTRACTION OF CALCULI   . EYE SURGERY Bilateral 2020  . LITHOTRIPSY  APPROX.  1998   ESWL  left kidney x 2  . ROTATOR CUFF REPAIR  2012   LEFT SHOULDER  . SHOULDER ARTHROSCOPY WITH ROTATOR CUFF REPAIR AND SUBACROMIAL DECOMPRESSION  09/12/2012   Procedure: SHOULDER ARTHROSCOPY WITH ROTATOR CUFF REPAIR AND SUBACROMIAL DECOMPRESSION;  Surgeon: Nita Sells, MD;  Location: [facility];  Service: Orthopedics;  Laterality: Right;  see above and biceps tendonotomy  . TRANSURETHRAL RESECTION OF BLADDER NECK N/A 04/08/2018   Procedure: TRANSURETHRAL RESECTION OF BLADDER NECK CONTRACTURE;  Surgeon: Franchot Gallo, MD;  Location: [facility];  Service: Urology;  Laterality: N/A;  . TRANSURETHRAL RESECTION OF PROSTATE  11-29-2009   BPH  . VASECTOMY  1970'S   GEN. ANES.     Current Outpatient Medications  Medication Sig Dispense Refill  . albuterol (PROAIR HFA) 108 (90 Base) MCG/ACT inhaler Inhale 2 puffs into the lungs every 6 (six) hours as needed for wheezing or shortness of breath. 18 g 2  . amLODipine (NORVASC) 5 MG tablet Take 1 tablet (5 mg total) by mouth daily. 90 tablet 3  . aspirin 81 MG tablet Take 81 mg by mouth daily.     . B-D 3CC LUER-LOK SYR 18GX1-1/2 18G X 1-1/2" 3 ML MISC USE ONCE A WEEK AS DIRECTED  2  . fenofibrate micronized (LOFIBRA) 67 MG capsule Take 1 capsule (67 mg total) by mouth daily before breakfast. 90 capsule 3  . latanoprost (XALATAN) 0.005 % ophthalmic solution Place 1 drop into the right eye at bedtime. 2.5 mL 12  . lisinopril (ZESTRIL) 40 MG tablet Take 1 tablet (40 mg total) by mouth daily. 90 tablet 3  . oxybutynin (DITROPAN) 5 MG tablet Take 5 mg by  mouth daily as needed. Overactive bladder    . psyllium (METAMUCIL) 58.6 % powder Take 1 packet by mouth daily.    . rosuvastatin (CRESTOR) 5 MG tablet Take 1 tablet (5 mg total) by mouth daily. 90 tablet 2  . testosterone cypionate (DEPOTESTOSTERONE CYPIONATE) 200 MG/ML injection Inject 0.5 mLs (100 mg total) into the muscle every 7 (seven) days. 10 mL 0  . triamcinolone (KENALOG) 0.025 % cream Apply 1 application topically as needed.     No current facility-administered medications for this visit.    Allergies:   Paroxetine, Morphine and related, Ciprofloxacin, and Flagyl [metronidazole hcl]    Social History:  The patient  reports that he quit smoking about 53 years ago. His smoking use included cigarettes. He quit after 8.00 years of use. He has never used smokeless tobacco. He reports current alcohol use. He reports that he does not use drugs.   Family History:  The patient's family history includes Cancer in his father and mother; Heart disease in his father; Squamous cell carcinoma in his mother.    ROS:  Please see the history of present illness.   Otherwise, review of systems are positive for difficulty losing weight.   All other systems are reviewed and negative.    PHYSICAL EXAM: VS:  BP (!) 136/70   Pulse 91   Ht 5' 9.25" (1.759 m)   Wt (!) 222 lb (100.7 kg)   SpO2 94%   BMI 32.55 kg/m  , BMI Body mass index is 32.55 kg/m. GEN: Well nourished, well developed, in no acute distress  HEENT: normal  Neck: no JVD, carotid bruits, or masses Cardiac: RRR; no murmurs, rubs, or gallops,no edema  Respiratory:  clear to auscultation bilaterally, normal work of breathing GI: soft, nontender, nondistended, + BS, obese MS: no deformity or atrophy  Skin: warm and dry, no rash Neuro:  Strength and sensation are intact Psych: euthymic mood, full affect    Recent Labs: 06/09/2019: BUN 17; Creatinine, Ser 1.23; Potassium 4.2; Sodium 143 11/01/2019: ALT 64 01/04/2020: TSH 0.86    Lipid Panel    Component Value Date/Time   CHOL 145 06/09/2019 0755   CHOL 126 12/12/2018 0810   TRIG 83.0 06/09/2019 0755   HDL 50.30 06/09/2019 0755   HDL 44 12/12/2018 0810   CHOLHDL 3 06/09/2019 0755   VLDL 16.6 06/09/2019 0755   LDLCALC 78 06/09/2019 0755   LDLCALC 67 12/12/2018 0810   LDLDIRECT 107.6 12/14/2011 0954     Other studies Reviewed: Additional studies/ records that were reviewed today with results demonstrating: Labs reviewed.  Hbg normal in June 2021.   ASSESSMENT AND PLAN:  1.   CAD/family history of CAD: Moderate by CT scan.  Continue aggressive secondary prevention.  Can do some weight training, but it should be low weights with more reps.   2.   Hyperlipidemia:  LDL 78 in 10/20.  Continue Crestor.  LFTs mildly increased.  FOllowed with PMD.  3. Obesity: Whole food, plant-based diet recommended. 4. Hypertension: Amlodipine was added at last visit.  Low-salt diet.  Better at home as well.  Controlled today.  Low salt diet.    Current medicines are reviewed at length with the patient today.  The patient concerns regarding his medicines were addressed.  The following changes have been made:  No change  Labs/ tests ordered today include:  No orders of the defined types were placed in this encounter.   Recommend 150 minutes/week of aerobic exercise Low fat, low carb, high fiber diet recommended  Disposition:   FU in 1 month   Signed, Larae Grooms, MD  03/15/2020 9:57 AM    [facility] [place], [place], [place]  [address] Phone: [phone]; Fax: [phone]

## 2020-03-15 ENCOUNTER — Ambulatory Visit: Payer: Medicare HMO | Admitting: Interventional Cardiology

## 2020-03-15 ENCOUNTER — Other Ambulatory Visit: Payer: Self-pay

## 2020-03-15 ENCOUNTER — Encounter: Payer: Self-pay | Admitting: Interventional Cardiology

## 2020-03-15 VITALS — BP 136/70 | HR 91 | Ht 69.25 in | Wt 222.0 lb

## 2020-03-15 DIAGNOSIS — Z8249 Family history of ischemic heart disease and other diseases of the circulatory system: Secondary | ICD-10-CM | POA: Diagnosis not present

## 2020-03-15 DIAGNOSIS — E669 Obesity, unspecified: Secondary | ICD-10-CM

## 2020-03-15 DIAGNOSIS — I10 Essential (primary) hypertension: Secondary | ICD-10-CM

## 2020-03-15 DIAGNOSIS — Z6830 Body mass index (BMI) 30.0-30.9, adult: Secondary | ICD-10-CM

## 2020-03-15 DIAGNOSIS — I2510 Atherosclerotic heart disease of native coronary artery without angina pectoris: Secondary | ICD-10-CM

## 2020-03-15 DIAGNOSIS — E782 Mixed hyperlipidemia: Secondary | ICD-10-CM | POA: Diagnosis not present

## 2020-03-15 NOTE — Patient Instructions (Signed)
Medication Instructions:  Your physician recommends that you continue on your current medications as directed. Please refer to the Current Medication list given to you today.  *If you need a refill on your cardiac medications before your next appointment, please call your pharmacy*   Lab Work: None   If you have labs (blood work) drawn today and your tests are completely normal, you will receive your results only by: . MyChart Message (if you have MyChart) OR . A paper copy in the mail If you have any lab test that is abnormal or we need to change your treatment, we will call you to review the results.   Testing/Procedures: None   Follow-Up: At [facility], you and your health needs are our priority.  As part of our continuing mission to provide you with exceptional heart care, we have created designated Provider Care Teams.  These Care Teams include your primary Cardiologist (physician) and Advanced Practice Providers (APPs -  Physician Assistants and Nurse Practitioners) who all work together to provide you with the care you need, when you need it.  We recommend signing up for the patient portal called "MyChart".  Sign up information is provided on this After Visit Summary.  MyChart is used to connect with patients for Virtual Visits (Telemedicine).  Patients are able to view lab/test results, encounter notes, upcoming appointments, etc.  Non-urgent messages can be sent to your provider as well.   To learn more about what you can do with MyChart, go to [email or web address].    Your next appointment:   6 month(s)  The format for your next appointment:   In Person  Provider:   You may see Jayadeep Varanasi, MD or one of the following Advanced Practice Providers on your designated Care Team:    Dayna Dunn, PA-C  Michele Lenze, PA-C    Other Instructions  High-Fiber Diet Fiber, also called dietary fiber, is a type of carbohydrate that is found in fruits, vegetables,  whole grains, and beans. A high-fiber diet can have many health benefits. Your health care provider may recommend a high-fiber diet to help:  Prevent constipation. Fiber can make your bowel movements more regular.  Lower your cholesterol.  Relieve the following conditions: ? Swelling of veins in the anus (hemorrhoids). ? Swelling and irritation (inflammation) of specific areas of the digestive tract (uncomplicated diverticulosis). ? A problem of the large intestine (colon) that sometimes causes pain and diarrhea (irritable bowel syndrome, IBS).  Prevent overeating as part of a weight-loss plan.  Prevent heart disease, type 2 diabetes, and certain cancers. What is my plan? The recommended daily fiber intake in grams (g) includes:  38 g for men age 50 or younger.  30 g for men over age 50.  25 g for women age 50 or younger.  21 g for women over age 50. You can get the recommended daily intake of dietary fiber by:  Eating a variety of fruits, vegetables, grains, and beans.  Taking a fiber supplement, if it is not possible to get enough fiber through your diet. What do I need to know about a high-fiber diet?  It is better to get fiber through food sources rather than from fiber supplements. There is not a lot of research about how effective supplements are.  Always check the fiber content on the nutrition facts label of any prepackaged food. Look for foods that contain 5 g of fiber or more per serving.  Talk with a diet and nutrition specialist (dietitian)   if you have questions about specific foods that are recommended or not recommended for your medical condition, especially if those foods are not listed below.  Gradually increase how much fiber you consume. If you increase your intake of dietary fiber too quickly, you may have bloating, cramping, or gas.  Drink plenty of water. Water helps you to digest fiber. What are tips for following this plan?  Eat a wide variety of  high-fiber foods.  Make sure that half of the grains that you eat each day are whole grains.  Eat breads and cereals that are made with whole-grain flour instead of refined flour or white flour.  Eat brown rice, bulgur wheat, or millet instead of white rice.  Start the day with a breakfast that is high in fiber, such as a cereal that contains 5 g of fiber or more per serving.  Use beans in place of meat in soups, salads, and pasta dishes.  Eat high-fiber snacks, such as berries, raw vegetables, nuts, and popcorn.  Choose whole fruits and vegetables instead of processed forms like juice or sauce. What foods can I eat?  Fruits Berries. Pears. Apples. Oranges. Avocado. Prunes and raisins. Dried figs. Vegetables Sweet potatoes. Spinach. Kale. Artichokes. Cabbage. Broccoli. Cauliflower. Green peas. Carrots. Squash. Grains Whole-grain breads. Multigrain cereal. Oats and oatmeal. Brown rice. Barley. Bulgur wheat. Millet. Quinoa. Bran muffins. Popcorn. Rye wafer crackers. Meats and other proteins Navy, kidney, and pinto beans. Soybeans. Split peas. Lentils. Nuts and seeds. Dairy Fiber-fortified yogurt. Beverages Fiber-fortified soy milk. Fiber-fortified orange juice. Other foods Fiber bars. The items listed above may not be a complete list of recommended foods and beverages. Contact a dietitian for more options. What foods are not recommended? Fruits Fruit juice. Cooked, strained fruit. Vegetables Fried potatoes. Canned vegetables. Well-cooked vegetables. Grains White bread. Pasta made with refined flour. White rice. Meats and other proteins Fatty cuts of meat. Fried chicken or fried fish. Dairy Milk. Yogurt. Cream cheese. Sour cream. Fats and oils Butters. Beverages Soft drinks. Other foods Cakes and pastries. The items listed above may not be a complete list of foods and beverages to avoid. Contact a dietitian for more information. Summary  Fiber is a type of  carbohydrate. It is found in fruits, vegetables, whole grains, and beans.  There are many health benefits of eating a high-fiber diet, such as preventing constipation, lowering blood cholesterol, helping with weight loss, and reducing your risk of heart disease, diabetes, and certain cancers.  Gradually increase your intake of fiber. Increasing too fast can result in cramping, bloating, and gas. Drink plenty of water while you increase your fiber.  The best sources of fiber include whole fruits and vegetables, whole grains, nuts, seeds, and beans. This information is not intended to replace advice given to you by your health care provider. Make sure you discuss any questions you have with your health care provider. Document Revised: 06/14/2017 Document Reviewed: 06/14/2017 Elsevier Patient Education  2020 [employer]   

## 2020-03-18 DIAGNOSIS — M25562 Pain in left knee: Secondary | ICD-10-CM | POA: Diagnosis not present

## 2020-03-21 ENCOUNTER — Ambulatory Visit (INDEPENDENT_AMBULATORY_CARE_PROVIDER_SITE_OTHER): Payer: Medicare HMO | Admitting: Ophthalmology

## 2020-03-21 ENCOUNTER — Encounter (INDEPENDENT_AMBULATORY_CARE_PROVIDER_SITE_OTHER): Payer: Self-pay | Admitting: Ophthalmology

## 2020-03-21 ENCOUNTER — Other Ambulatory Visit: Payer: Self-pay

## 2020-03-21 DIAGNOSIS — H35372 Puckering of macula, left eye: Secondary | ICD-10-CM | POA: Diagnosis not present

## 2020-03-21 DIAGNOSIS — H35371 Puckering of macula, right eye: Secondary | ICD-10-CM | POA: Diagnosis not present

## 2020-03-21 DIAGNOSIS — H43811 Vitreous degeneration, right eye: Secondary | ICD-10-CM

## 2020-03-21 DIAGNOSIS — H26491 Other secondary cataract, right eye: Secondary | ICD-10-CM | POA: Diagnosis not present

## 2020-03-21 NOTE — Progress Notes (Addendum)
03/21/2020     CHIEF COMPLAINT Patient presents for Retina Follow Up   HISTORY OF PRESENT ILLNESS: Jack Norris is a 79 y.o. male who presents to the clinic today for:   HPI    Retina Follow Up    Patient presents with  Other.  In both eyes.  This started 6 months ago.  Severity is mild.  Duration of 6 months.  Since onset it is stable.          Comments    6 Month F/U OU  Pt reports stable VA OU x 6 months. Pt sts Dr. Katy Fitch told him he has clouding behind his lens implants and wants Dr. Zadie Rhine to clear him for YAG procedure. Pt denies any new symptoms OU.       Last edited by Rockie Neighbours, [place] on 03/21/2020  9:25 AM. (History)      Referring physician: Jinny Sanders, MD [place],  [place] [id number]  HISTORICAL INFORMATION:   Selected notes from the MEDICAL RECORD NUMBER       CURRENT MEDICATIONS: Current Outpatient Medications (Ophthalmic Drugs)  Medication Sig  . latanoprost (XALATAN) 0.005 % ophthalmic solution Place 1 drop into the right eye at bedtime.   No current facility-administered medications for this visit. (Ophthalmic Drugs)   Current Outpatient Medications (Other)  Medication Sig  . albuterol (PROAIR HFA) 108 (90 Base) MCG/ACT inhaler Inhale 2 puffs into the lungs every 6 (six) hours as needed for wheezing or shortness of breath.  Marland Kitchen amLODipine (NORVASC) 5 MG tablet Take 1 tablet (5 mg total) by mouth daily.  Marland Kitchen aspirin 81 MG tablet Take 81 mg by mouth daily.   . B-D 3CC LUER-LOK SYR 18GX1-1/2 18G X 1-1/2" 3 ML MISC USE ONCE A WEEK AS DIRECTED  . fenofibrate micronized (LOFIBRA) 67 MG capsule Take 1 capsule (67 mg total) by mouth daily before breakfast.  . lisinopril (ZESTRIL) 40 MG tablet Take 1 tablet (40 mg total) by mouth daily.  Marland Kitchen oxybutynin (DITROPAN) 5 MG tablet Take 5 mg by mouth daily as needed. Overactive bladder  . psyllium (METAMUCIL) 58.6 % powder Take 1 packet by mouth daily.  . rosuvastatin (CRESTOR) 5 MG  tablet Take 1 tablet (5 mg total) by mouth daily.  Marland Kitchen testosterone cypionate (DEPOTESTOSTERONE CYPIONATE) 200 MG/ML injection Inject 0.5 mLs (100 mg total) into the muscle every 7 (seven) days.  Marland Kitchen triamcinolone (KENALOG) 0.025 % cream Apply 1 application topically as needed.   No current facility-administered medications for this visit. (Other)      REVIEW OF SYSTEMS:    ALLERGIES Allergies  Allergen Reactions  . Paroxetine     REACTION: Had mild personality change on this medication and doesn't want to take this again.  Marland Kitchen Morphine And Related     Causes bladder problems  . Ciprofloxacin Other (See Comments)    ABDOMINAL PAIN/ sob  . Flagyl [Metronidazole Hcl] Other (See Comments)    ABDOMINAL PAIN/ sob    PAST MEDICAL HISTORY Past Medical History:  Diagnosis Date  . Asthma, mild    as child-only now if has URI  . BPH (benign prostatic hypertrophy)   . BPPV (benign paroxysmal positional vertigo)   . Diverticulosis   . Eczema   . Fatty liver disease, nonalcoholic   . Frequent nosebleeds    History of  . GERD (gastroesophageal reflux disease)    occ uses TUMS as needed  . Glaucoma   . History  of bronchitis   . History of colon polyps   . History of diverticulitis   . History of kidney stones   . Hyperlipemia   . Hypertension   . Influenza A 11/2017  . Memory changes   . OA (osteoarthritis)   . Obesity   . Overactive bladder 2020  . PVCs (premature ventricular contractions)   . Renal calculi right    AND LEFT X1 NON-OBSTRUCTIVE  . Right sided sciatica    Bilateral buttock and thighs  . Simple renal cyst right interpoler, simpler in nature , asymptomatic  per urologist note (dr dahlstedt)  12-26-2011   BENIGN, has been drained  . Testosterone deficiency   . Wears glasses   . Wears hearing aid    Past Surgical History:  Procedure Laterality Date  . APPENDECTOMY    . bone spur removal     BILATERAL GREAT TOE  . COLONOSCOPY    . CYSTO/ RIGHT RETROGRADE  PYELOGRAM/ RIGHT URETER DILATATION/ RIGHT URETEROSCOPY/ LASER LITHOTRIPSY RIGHT RENAL PELVIS AND LOWER POLE CALCULI/ PLACEMENT STENT  01-11-2012  DR DAHLSTEDT   RIGHT RENAL PELVIS AND LOWER POLE CALCULI  . CYSTOSCOPY W/ RETROGRADES  06/02/2012   Procedure: CYSTOSCOPY WITH RETROGRADE PYELOGRAM;  Surgeon: Franchot Gallo, MD;  Location: [facility];  Service: Urology;  Laterality: Right;  . CYSTOSCOPY WITH URETEROSCOPY  06/02/2012   Procedure: CYSTOSCOPY WITH URETEROSCOPY;  Surgeon: Franchot Gallo, MD;  Location[facility];  Service: Urology;  Laterality: Right;  90 MIN ALSO EXTRACTION OF CALCULI   . EYE SURGERY Bilateral 2020  . LITHOTRIPSY  APPROX.  1998   ESWL  left kidney x 2  . ROTATOR CUFF REPAIR  2012   LEFT SHOULDER  . SHOULDER ARTHROSCOPY WITH ROTATOR CUFF REPAIR AND SUBACROMIAL DECOMPRESSION  09/12/2012   Procedure: SHOULDER ARTHROSCOPY WITH ROTATOR CUFF REPAIR AND SUBACROMIAL DECOMPRESSION;  Surgeon: Nita Sells, MD;  Location: [facility];  Service: Orthopedics;  Laterality: Right;  see above and biceps tendonotomy  . TRANSURETHRAL RESECTION OF BLADDER NECK N/A 04/08/2018   Procedure: TRANSURETHRAL RESECTION OF BLADDER NECK CONTRACTURE;  Surgeon: Franchot Gallo, MD;  Location: [facility];  Service: Urology;  Laterality: N/A;  . TRANSURETHRAL RESECTION OF PROSTATE  11-29-2009   BPH  . VASECTOMY  1970'S   GEN. ANES.    FAMILY HISTORY Family History  Problem Relation Age of Onset  . Cancer Mother        breast and kidney   . Squamous cell carcinoma Mother   . Cancer Father        lung  . Heart disease Father     SOCIAL HISTORY Social History   Tobacco Use  . Smoking status: Former Smoker    Years: 8.00    Types: Cigarettes    Quit date: 01/05/1967    Years since quitting: 53.2  . Smokeless tobacco: Never Used  . Tobacco comment: QUIT AGE 41  Vaping Use  . Vaping Use: Never used    Substance Use Topics  . Alcohol use: Yes    Alcohol/week: 0.0 standard drinks    Comment: OCCASIONAL  . Drug use: No         OPHTHALMIC EXAM:  Base Eye Exam    Visual Acuity (ETDRS)      Right Left   Dist cc 20/20 -2 20/25 -1   Correction: Glasses       Tonometry (Tonopen, 9:29 AM)      Right Left  Pressure 11 13       Pupils      Pupils Dark Light Shape React APD   Right PERRL 4 3 Round Brisk None   Left PERRL 4 3 Round Brisk None       Visual Fields (Counting fingers)      Left Right    Full Full       Extraocular Movement      Right Left    Full Full       Neuro/Psych    Oriented x3: Yes   Mood/Affect: Normal       Dilation    Both eyes: 1.0% Mydriacyl, 2.5% Phenylephrine @ 9:29 AM        Slit Lamp and Fundus Exam    External Exam      Right Left   External Normal Normal       Slit Lamp Exam      Right Left   Lids/Lashes Normal Normal   Conjunctiva/Sclera White and quiet White and quiet   Cornea Clear Clear   Anterior Chamber Deep and quiet Deep and quiet   Iris Round and reactive Round and reactive   Lens Posterior chamber intraocular lens Posterior chamber intraocular lens   Anterior Vitreous Normal Normal       Fundus Exam      Right Left   Posterior Vitreous Posterior vitreous detachment    C/D Ratio 0.55 0.55   Macula  no topo distortion          IMAGING AND PROCEDURES  Imaging and Procedures for 03/21/20  OCT, Retina - OU - Both Eyes       Right Eye Quality was good. Scan locations included subfoveal. Central Foveal Thickness: 316. Progression has been stable.   Left Eye Quality was good. Scan locations included subfoveal. Central Foveal Thickness: 423. Progression has improved.   Notes OS, continued slow improvement of the central foveal thickness, now over 1 year post vitrectomy and internal limiting membrane peel.  Patient has been on CPAP now for some 12 to 16 months and this contributes to the improved  macular findings my opinion                ASSESSMENT/PLAN:  No problem-specific Assessment & Plan notes found for this encounter.      ICD-10-CM   1. Left epiretinal membrane  H35.372 OCT, Retina - OU - Both Eyes  2. Right epiretinal membrane  H35.371   3. Posterior vitreous detachment of right eye  H43.811   4. Posterior capsular opacification, right  H26.491     1.  May proceed with YAG capsulotomy of the right eye at any time.  Up with Dr. Carolynn Sayers as scheduled  2.  3.  Ophthalmic Meds Ordered this visit:  No orders of the defined types were placed in this encounter.      Return in about 1 year (around 03/21/2021) for DILATE OU, OCT.  There are no Patient Instructions on file for this visit.   Explained the diagnoses, plan, and follow up with the patient and they expressed understanding.  Patient expressed understanding of the importance of proper follow up care.   Clent Demark Annika Selke M.D. Diseases & Surgery of the Retina and Vitreous [facility] [place] 03/21/20     Abbreviations: M myopia (nearsighted); A astigmatism; H hyperopia (farsighted); P presbyopia; Mrx spectacle prescription;  CTL contact lenses; OD right eye; OS left eye; OU both eyes  XT exotropia; ET esotropia;  PEK punctate epithelial keratitis; PEE punctate epithelial erosions; DES dry eye syndrome; MGD meibomian gland dysfunction; ATs artificial tears; PFAT's preservative free artificial tears; [place] nuclear sclerotic cataract; PSC posterior subcapsular cataract; ERM epi-retinal membrane; PVD posterior vitreous detachment; RD retinal detachment; DM diabetes mellitus; DR diabetic retinopathy; NPDR non-proliferative diabetic retinopathy; PDR proliferative diabetic retinopathy; CSME clinically significant macular edema; DME diabetic macular edema; dbh dot blot hemorrhages; CWS cotton wool spot; POAG primary open angle glaucoma; C/D cup-to-disc ratio; HVF humphrey visual field; GVF goldmann visual  field; OCT optical coherence tomography; IOP intraocular pressure; BRVO Branch retinal vein occlusion; CRVO central retinal vein occlusion; CRAO central retinal artery occlusion; BRAO branch retinal artery occlusion; RT retinal tear; SB scleral buckle; PPV pars plana vitrectomy; VH Vitreous hemorrhage; PRP panretinal laser photocoagulation; IVK intravitreal kenalog; VMT vitreomacular traction; MH Macular hole;  NVD neovascularization of the disc; NVE neovascularization elsewhere; AREDS age related eye disease study; ARMD age related macular degeneration; POAG primary open angle glaucoma; EBMD epithelial/anterior basement membrane dystrophy; ACIOL anterior chamber intraocular lens; IOL intraocular lens; PCIOL posterior chamber intraocular lens; Phaco/IOL phacoemulsification with intraocular lens placement; [place] photorefractive keratectomy; LASIK laser assisted in situ keratomileusis; HTN hypertension; DM diabetes mellitus; COPD chronic obstructive pulmonary disease

## 2020-03-23 DIAGNOSIS — I10 Essential (primary) hypertension: Secondary | ICD-10-CM | POA: Diagnosis not present

## 2020-03-23 DIAGNOSIS — G4733 Obstructive sleep apnea (adult) (pediatric): Secondary | ICD-10-CM | POA: Diagnosis not present

## 2020-03-27 DIAGNOSIS — E7529 Other sphingolipidosis: Secondary | ICD-10-CM | POA: Diagnosis not present

## 2020-03-27 DIAGNOSIS — C44311 Basal cell carcinoma of skin of nose: Secondary | ICD-10-CM | POA: Diagnosis not present

## 2020-03-27 DIAGNOSIS — Z872 Personal history of diseases of the skin and subcutaneous tissue: Secondary | ICD-10-CM | POA: Diagnosis not present

## 2020-03-27 DIAGNOSIS — Q801 X-linked ichthyosis: Secondary | ICD-10-CM | POA: Diagnosis not present

## 2020-03-27 DIAGNOSIS — L570 Actinic keratosis: Secondary | ICD-10-CM | POA: Diagnosis not present

## 2020-03-27 DIAGNOSIS — L578 Other skin changes due to chronic exposure to nonionizing radiation: Secondary | ICD-10-CM | POA: Diagnosis not present

## 2020-03-27 DIAGNOSIS — Z86018 Personal history of other benign neoplasm: Secondary | ICD-10-CM | POA: Diagnosis not present

## 2020-03-27 DIAGNOSIS — D485 Neoplasm of uncertain behavior of skin: Secondary | ICD-10-CM | POA: Diagnosis not present

## 2020-03-27 DIAGNOSIS — C441192 Basal cell carcinoma of skin of left lower eyelid, including canthus: Secondary | ICD-10-CM | POA: Diagnosis not present

## 2020-04-01 DIAGNOSIS — H26493 Other secondary cataract, bilateral: Secondary | ICD-10-CM | POA: Diagnosis not present

## 2020-04-11 DIAGNOSIS — C4491 Basal cell carcinoma of skin, unspecified: Secondary | ICD-10-CM | POA: Diagnosis not present

## 2020-04-11 DIAGNOSIS — C441192 Basal cell carcinoma of skin of left lower eyelid, including canthus: Secondary | ICD-10-CM | POA: Diagnosis not present

## 2020-04-25 DIAGNOSIS — C44311 Basal cell carcinoma of skin of nose: Secondary | ICD-10-CM | POA: Diagnosis not present

## 2020-04-25 DIAGNOSIS — C4491 Basal cell carcinoma of skin, unspecified: Secondary | ICD-10-CM | POA: Diagnosis not present

## 2020-05-23 DIAGNOSIS — H6123 Impacted cerumen, bilateral: Secondary | ICD-10-CM | POA: Diagnosis not present

## 2020-05-23 DIAGNOSIS — K219 Gastro-esophageal reflux disease without esophagitis: Secondary | ICD-10-CM | POA: Diagnosis not present

## 2020-05-23 DIAGNOSIS — J300 Vasomotor rhinitis: Secondary | ICD-10-CM | POA: Diagnosis not present

## 2020-05-23 NOTE — Telephone Encounter (Signed)
I spoke with patient. He reports he saw ENT today and was told neck and throat pain could be reflux or angina.  ENT exam was OK.  Started on medicine for reflux.  Patient reports pain is new since last office visit with Dr Irish Lack.  He does not recall the neck pain he was having at that time.  Pain is present all the time and extends from jaw into throat.  I scheduled patient to see Ermalinda Barrios, PA on October 5,2021 at 8:15.

## 2020-05-24 NOTE — Progress Notes (Signed)
Cardiology Office Note    Date:  05/28/2020   ID:  Jack Norris, DOB 1940-12-30, MRN [id number]  PCP:  Jinny Sanders, MD  Cardiologist: Larae Grooms, MD EPS: None  Chief Complaint  Patient presents with  . Neck Pain    History of Present Illness:  Jack Norris is a 79 y.o. male with history of CAD on coronary CTA 2019 with calcium score 976 suspected severe 70-90% PDA but FFR negative, HTN, HLD, obesity and family history of CAD.  Last saw Dr. Irish Lack 02/2020 and doing well.  Patient is added onto my schedule for constant neck pain. Saw ENT who told him it could be reflux or angina and started on reflux med. Patient says he hasn't had any reflux since he started the medicine. Complains of constant left neck pain-sometimes it's very mild, sometimes just mild. Not exertional. Feels like it's in his throat and back of neck. Can aggravate it by turning head to the left. ENT scoped him and didn't see any nodules. Working with a personal trainer-resistant training-did upper body yest and doesn't thinks he's strained himself. Walks 2 miles 3 times week. Had botox earlier this year because of teeth grinding and dental work. No exertional chest pain or shortness of breath.  Pain has been going on for several months and he mentioned it when he saw Dr. Irish Lack back in July.   Past Medical History:  Diagnosis Date  . Asthma, mild    as child-only now if has URI  . BPH (benign prostatic hypertrophy)   . BPPV (benign paroxysmal positional vertigo)   . Diverticulosis   . Eczema   . Fatty liver disease, nonalcoholic   . Frequent nosebleeds    History of  . GERD (gastroesophageal reflux disease)    occ uses TUMS as needed  . Glaucoma   . History of bronchitis   . History of colon polyps   . History of diverticulitis   . History of kidney stones   . Hyperlipemia   . Hypertension   . Influenza A 11/2017  . Memory changes   . OA (osteoarthritis)   . Obesity   .  Overactive bladder 2020  . PVCs (premature ventricular contractions)   . Renal calculi right    AND LEFT X1 NON-OBSTRUCTIVE  . Right sided sciatica    Bilateral buttock and thighs  . Simple renal cyst right interpoler, simpler in nature , asymptomatic  per urologist note (dr dahlstedt)  12-26-2011   BENIGN, has been drained  . Testosterone deficiency   . Wears glasses   . Wears hearing aid     Past Surgical History:  Procedure Laterality Date  . APPENDECTOMY    . bone spur removal     BILATERAL GREAT TOE  . COLONOSCOPY    . CYSTO/ RIGHT RETROGRADE PYELOGRAM/ RIGHT URETER DILATATION/ RIGHT URETEROSCOPY/ LASER LITHOTRIPSY RIGHT RENAL PELVIS AND LOWER POLE CALCULI/ PLACEMENT STENT  01-11-2012  DR DAHLSTEDT   RIGHT RENAL PELVIS AND LOWER POLE CALCULI  . CYSTOSCOPY W/ RETROGRADES  06/02/2012   Procedure: CYSTOSCOPY WITH RETROGRADE PYELOGRAM;  Surgeon: Franchot Gallo, MD;  [facility];  Service: Urology;  Laterality: Right;  . CYSTOSCOPY WITH URETEROSCOPY  06/02/2012   Procedure: CYSTOSCOPY WITH URETEROSCOPY;  Surgeon: Franchot Gallo, MD;  Location: [facility] ;  Service: Urology;  Laterality: Right;  90 MIN ALSO EXTRACTION OF CALCULI   . EYE SURGERY Bilateral 2020  . LITHOTRIPSY  APPROX.  1998  ESWL  left kidney x 2  . ROTATOR CUFF REPAIR  2012   LEFT SHOULDER  . SHOULDER ARTHROSCOPY WITH ROTATOR CUFF REPAIR AND SUBACROMIAL DECOMPRESSION  09/12/2012   Procedure: SHOULDER ARTHROSCOPY WITH ROTATOR CUFF REPAIR AND SUBACROMIAL DECOMPRESSION;  Surgeon: Nita Sells, MD;  Location: [facility];  Service: Orthopedics;  Laterality: Right;  see above and biceps tendonotomy  . TRANSURETHRAL RESECTION OF BLADDER NECK N/A 04/08/2018   Procedure: TRANSURETHRAL RESECTION OF BLADDER NECK CONTRACTURE;  Surgeon: Franchot Gallo, MD;  Location: [facility];  Service: Urology;  Laterality: N/A;  . TRANSURETHRAL  RESECTION OF PROSTATE  11-29-2009   BPH  . VASECTOMY  1970'S   GEN. ANES.    Current Medications: Current Meds  Medication Sig  . albuterol (PROAIR HFA) 108 (90 Base) MCG/ACT inhaler Inhale 2 puffs into the lungs every 6 (six) hours as needed for wheezing or shortness of breath.  Marland Kitchen amLODipine (NORVASC) 5 MG tablet Take 1 tablet (5 mg total) by mouth daily.  Marland Kitchen aspirin 81 MG tablet Take 81 mg by mouth daily.   . B-D 3CC LUER-LOK SYR 18GX1-1/2 18G X 1-1/2" 3 ML MISC USE ONCE A WEEK AS DIRECTED  . fenofibrate micronized (LOFIBRA) 67 MG capsule Take 1 capsule (67 mg total) by mouth daily before breakfast.  . latanoprost (XALATAN) 0.005 % ophthalmic solution Place 1 drop into the right eye at bedtime.  Marland Kitchen lisinopril (ZESTRIL) 40 MG tablet Take 1 tablet (40 mg total) by mouth daily.  . meclizine (ANTIVERT) 25 MG tablet as needed.  Marland Kitchen omeprazole (PRILOSEC) 40 MG capsule daily.  Marland Kitchen oxybutynin (DITROPAN) 5 MG tablet Take 5 mg by mouth daily as needed. Overactive bladder  . psyllium (METAMUCIL) 58.6 % powder Take 1 packet by mouth daily.  . rosuvastatin (CRESTOR) 5 MG tablet Take 1 tablet (5 mg total) by mouth daily.  Marland Kitchen testosterone cypionate (DEPOTESTOSTERONE CYPIONATE) 200 MG/ML injection Inject 0.5 mLs (100 mg total) into the muscle every 7 (seven) days.  Marland Kitchen triamcinolone (KENALOG) 0.025 % cream Apply 1 application topically as needed.     Allergies:   Paroxetine, Morphine and related, Ciprofloxacin, and Flagyl [metronidazole hcl]   Social History   Socioeconomic History  . Marital status: Married    Spouse name: Not on file  . Number of children: 3  . Years of education: college  . Highest education level: Bachelor's degree (e.g., BA, AB, BS)  Occupational History  . Occupation: QUOTATION ENGINEER    Employer: [employer]  Tobacco Use  . Smoking status: Former Smoker    Years: 8.00    Types: Cigarettes    Quit date: 01/05/1967    Years since quitting: 53.4  . Smokeless tobacco:  Never Used  . Tobacco comment: QUIT AGE 2  Vaping Use  . Vaping Use: Never used  Substance and Sexual Activity  . Alcohol use: Yes    Alcohol/week: 0.0 standard drinks    Comment: OCCASIONAL  . Drug use: No  . Sexual activity: Yes  Other Topics Concern  . Not on file  Social History Narrative   regular exercise    Full code, (Reviewed 2014)      Lives at home with his wife.   Right-handed.   Caffeine use: 4 cups per day.   Social Determinants of Health   Financial Resource Strain: Low Risk   . Difficulty of Paying Living Expenses: Not hard at all  Food Insecurity: No Food Insecurity  . Worried About  Running Out of Food in the Last Year: Never true  . Ran Out of Food in the Last Year: Never true  Transportation Needs: No Transportation Needs  . Lack of Transportation (Medical): No  . Lack of Transportation (Non-Medical): No  Physical Activity: Inactive  . Days of Exercise per Week: 0 days  . Minutes of Exercise per Session: 0 min  Stress: No Stress Concern Present  . Feeling of Stress : Not at all  Social Connections:   . Frequency of Communication with Friends and Family: Not on file  . Frequency of Social Gatherings with Friends and Family: Not on file  . Attends Religious Services: Not on file  . Active Member of Clubs or Organizations: Not on file  . Attends Archivist Meetings: Not on file  . Marital Status: Not on file     Family History:  The patient's family history includes Cancer in his father and mother; Heart disease in his father; Squamous cell carcinoma in his mother.   ROS:   Please see the history of present illness.    ROS All other systems reviewed and are negative.   PHYSICAL EXAM:   VS:  BP (!) 142/70   Pulse 83   Ht 5\' 9"  (1.753 m)   Wt 215 lb (97.5 kg)   SpO2 97%   BMI 31.75 kg/m   Physical Exam  GEN: Well nourished, well developed, in no acute distress  Neck: no JVD, carotid bruits, or masses Cardiac:RRR; no murmurs, rubs,  or gallops  Respiratory:  clear to auscultation bilaterally, normal work of breathing GI: soft, nontender, nondistended, + BS Ext: without cyanosis, clubbing, or edema, Good distal pulses bilaterally Neuro:  Alert and Oriented x 3 Psych: euthymic mood, full affect  Wt Readings from Last 3 Encounters:  05/28/20 215 lb (97.5 kg)  03/15/20 (!) 222 lb (100.7 kg)  01/04/20 225 lb (102.1 kg)      Studies/Labs Reviewed:   EKG:  EKG is ordered today.  Recent Labs: 06/09/2019: BUN 17; Creatinine, Ser 1.23; Potassium 4.2; Sodium 143 11/01/2019: ALT 64 01/04/2020: TSH 0.86   Lipid Panel    Component Value Date/Time   CHOL 145 06/09/2019 0755   CHOL 126 12/12/2018 0810   TRIG 83.0 06/09/2019 0755   HDL 50.30 06/09/2019 0755   HDL 44 12/12/2018 0810   CHOLHDL 3 06/09/2019 0755   VLDL 16.6 06/09/2019 0755   LDLCALC 78 06/09/2019 0755   LDLCALC 67 12/12/2018 0810   LDLDIRECT 107.6 12/14/2011 0954    Additional studies/ records that were reviewed today include:  Coronary CTA 07/2018 IMPRESSION: 1. Coronary artery calcium score 976 Agatston units, placing the patient in the 75th percentile for age and gender, suggesting intermediate risk for future cardiac events.   2. Suspect severe (70-90%) stenosis in the mid PDA, this is a moderate-sized vessel.   3.  Extensive LAD plaque, suspect up to 50% mid LAD stenosis.   Will send for FFR to assess hemodynamic significance of disease.   Dalton Mclean     Electronically Signed   By: Loralie Champagne M.D.   On: 07/28/2018 11:14   IMPRESSION: 1. No evidence for hemodynamically significant stenosis in the RCA system.   2. FFR < 0.8 in the distal LAD but no evidence for a discrete stenosis and FFR 0.87 in the mid LAD after a known area of stenosis. Difficult to know what to make of distal LAD finding.   Dalton [employer]  Electronically Signed   By: Loralie Champagne M.D.   On: 07/29/2018 16:39       ASSESSMENT:    1. Neck  pain   2. Coronary artery disease involving native coronary artery of native heart without angina pectoris   3. PVC's (premature ventricular contractions)   4. Essential hypertension   5. Class 1 obesity with serious comorbidity and body mass index (BMI) of 30.0 to 30.9 in adult, unspecified obesity type   6. Hyperlipidemia, unspecified hyperlipidemia type   7. Family history of early CAD      PLAN:  In order of problems listed above:  Neck pain constant for several months can feel it more when he turns his head to the left.  He is doing resistance training which could also be aggravating it but the pain started before he started training.  No exertional symptoms.  Does fast walking 2 miles 3 times a week without symptoms.  Sounds musculoskeletal to me.  I offered to order C-spine x-rays but he wants to see his PCP for a work-up first.  CAD with History of chest pain, no ischemia on NST 2018, CTA 2019 FFR negative except in distal LAD but no stenosis identified, suspected PDA lesion.  No angina  Frequent PVC's with stress heart rate regular on exam  Hypertension blood pressure controlled  Obesity started resistance training and walking some.  Encouraged 150 minutes of cardio exercise weekly.  HLD on Crestor LDL 78 05/2019  Family history of CAD    Medication Adjustments/Labs and Tests Ordered: Current medicines are reviewed at length with the patient today.  Concerns regarding medicines are outlined above.  Medication changes, Labs and Tests ordered today are listed in the Patient Instructions below. Patient Instructions  Medication Instructions:  Your physician recommends that you continue on your current medications as directed. Please refer to the Current Medication list given to you today.  *If you need a refill on your cardiac medications before your next appointment, please call your pharmacy*   Lab Work: None If you have labs (blood work) drawn today and your tests are  completely normal, you will receive your results only by: Marland Kitchen MyChart Message (if you have MyChart) OR . A paper copy in the mail If you have any lab test that is abnormal or we need to change your treatment, we will call you to review the results.   Testing/Procedures: None   Follow-Up: At [facility], you and your health needs are our priority.  As part of our continuing mission to provide you with exceptional heart care, we have created designated Provider Care Teams.  These Care Teams include your primary Cardiologist (physician) and Advanced Practice Providers (APPs -  Physician Assistants and Nurse Practitioners) who all work together to provide you with the care you need, when you need it.  Your next appointment:   08/28/2020  The format for your next appointment:   In Person  Provider:   Casandra Doffing, MD      Signed, Ermalinda Barrios, PA-C  05/28/2020 8:46 AM    [address], [place], [place]  [address] Phone: [phone]; Fax: [phone]

## 2020-05-27 NOTE — Telephone Encounter (Signed)
I called pt to see if the reflux medication has helped and to ask if he felt like he still needs the 10/5 appt with Ermalinda Barrios, PA. He wasn't at home so I spoke with his Wife. She stated that his reflux is better but he is still having some neck pain. She will ask him when he returns home if he feels like he needs this appt and if not she will call and cancel.

## 2020-05-28 ENCOUNTER — Other Ambulatory Visit: Payer: Self-pay

## 2020-05-28 ENCOUNTER — Ambulatory Visit (INDEPENDENT_AMBULATORY_CARE_PROVIDER_SITE_OTHER): Payer: Medicare HMO | Admitting: Physician Assistant

## 2020-05-28 ENCOUNTER — Encounter: Payer: Self-pay | Admitting: Physician Assistant

## 2020-05-28 VITALS — BP 142/70 | HR 83 | Ht 69.0 in | Wt 215.0 lb

## 2020-05-28 DIAGNOSIS — I493 Ventricular premature depolarization: Secondary | ICD-10-CM | POA: Diagnosis not present

## 2020-05-28 DIAGNOSIS — E785 Hyperlipidemia, unspecified: Secondary | ICD-10-CM

## 2020-05-28 DIAGNOSIS — Z8249 Family history of ischemic heart disease and other diseases of the circulatory system: Secondary | ICD-10-CM | POA: Diagnosis not present

## 2020-05-28 DIAGNOSIS — M542 Cervicalgia: Secondary | ICD-10-CM

## 2020-05-28 DIAGNOSIS — I10 Essential (primary) hypertension: Secondary | ICD-10-CM

## 2020-05-28 DIAGNOSIS — E669 Obesity, unspecified: Secondary | ICD-10-CM

## 2020-05-28 DIAGNOSIS — I2510 Atherosclerotic heart disease of native coronary artery without angina pectoris: Secondary | ICD-10-CM

## 2020-05-28 DIAGNOSIS — Z6830 Body mass index (BMI) 30.0-30.9, adult: Secondary | ICD-10-CM

## 2020-05-28 NOTE — Patient Instructions (Signed)
Medication Instructions:  Your physician recommends that you continue on your current medications as directed. Please refer to the Current Medication list given to you today.  *If you need a refill on your cardiac medications before your next appointment, please call your pharmacy*   Lab Work: None If you have labs (blood work) drawn today and your tests are completely normal, you will receive your results only by: Marland Kitchen MyChart Message (if you have MyChart) OR . A paper copy in the mail If you have any lab test that is abnormal or we need to change your treatment, we will call you to review the results.   Testing/Procedures: None   Follow-Up: At [facility], you and your health needs are our priority.  As part of our continuing mission to provide you with exceptional heart care, we have created designated Provider Care Teams.  These Care Teams include your primary Cardiologist (physician) and Advanced Practice Providers (APPs -  Physician Assistants and Nurse Practitioners) who all work together to provide you with the care you need, when you need it.  Your next appointment:   08/28/2020  The format for your next appointment:   In Person  Provider:   Casandra Doffing, MD

## 2020-06-05 ENCOUNTER — Telehealth: Payer: Self-pay | Admitting: Interventional Cardiology

## 2020-06-05 DIAGNOSIS — H40051 Ocular hypertension, right eye: Secondary | ICD-10-CM | POA: Diagnosis not present

## 2020-06-05 DIAGNOSIS — H26493 Other secondary cataract, bilateral: Secondary | ICD-10-CM | POA: Diagnosis not present

## 2020-06-05 MED ORDER — ROSUVASTATIN CALCIUM 5 MG PO TABS
5.0000 mg | ORAL_TABLET | Freq: Every day | ORAL | 3 refills | Status: DC
Start: 2020-06-05 — End: 2021-05-30

## 2020-06-05 NOTE — Telephone Encounter (Signed)
Pt's medication was sent to pt's pharmacy as requested. Confirmation received.  °

## 2020-06-05 NOTE — Telephone Encounter (Signed)
*  STAT* If patient is at the pharmacy, call can be transferred to refill team.   1. Which medications need to be refilled? (please list name of each medication and dose if known)  rosuvastatin (CRESTOR) 5 MG tablet  2. Which pharmacy/location (including street and city if local pharmacy) is medication to be sent to?[facility] [place], [place]   3. Do they need a 30 day or 90 day supply? [place]

## 2020-06-10 ENCOUNTER — Other Ambulatory Visit: Payer: Self-pay

## 2020-06-10 ENCOUNTER — Other Ambulatory Visit (INDEPENDENT_AMBULATORY_CARE_PROVIDER_SITE_OTHER): Payer: Medicare HMO

## 2020-06-10 ENCOUNTER — Telehealth: Payer: Self-pay | Admitting: Family Medicine

## 2020-06-10 DIAGNOSIS — E7800 Pure hypercholesterolemia, unspecified: Secondary | ICD-10-CM

## 2020-06-10 LAB — COMPREHENSIVE METABOLIC PANEL
ALT: 14 U/L (ref 0–53)
AST: 15 U/L (ref 0–37)
Albumin: 4.3 g/dL (ref 3.5–5.2)
Alkaline Phosphatase: 82 U/L (ref 39–117)
BUN: 19 mg/dL (ref 6–23)
CO2: 28 mEq/L (ref 19–32)
Calcium: 9.4 mg/dL (ref 8.4–10.5)
Chloride: 104 mEq/L (ref 96–112)
Creatinine, Ser: 1.21 mg/dL (ref 0.40–1.50)
GFR: 56.51 mL/min — ABNORMAL LOW (ref >60.00)
Glucose, Bld: 93 mg/dL (ref 70–99)
Potassium: 4.5 mEq/L (ref 3.5–5.1)
Sodium: 139 mEq/L (ref 135–145)
Total Bilirubin: 0.7 mg/dL (ref 0.2–1.2)
Total Protein: 6.6 g/dL (ref 6.0–8.3)

## 2020-06-10 LAB — LIPID PANEL
Cholesterol: 129 mg/dL (ref 0–200)
HDL: 39.8 mg/dL (ref >39.00)
LDL Cholesterol: 69 mg/dL (ref 0–99)
NonHDL: 89.32
Total CHOL/HDL Ratio: 3
Triglycerides: 104 mg/dL (ref 0.0–149.0)
VLDL: 20.8 mg/dL (ref 0.0–40.0)

## 2020-06-10 NOTE — Telephone Encounter (Signed)
-----   Message from Cloyd Stagers, RT sent at 05/28/2020  2:34 PM EDT ----- Regarding: Lab Orders for Monday 10.18.2021 Please place lab orders for Monday 10.18.2021, office visit for physical on Tuesday 10.26.2021 Thank you, Dyke Maes RT(R)

## 2020-06-11 NOTE — Progress Notes (Signed)
No critical labs need to be addressed urgently. We will discuss labs in detail at upcoming office visit.   

## 2020-06-17 ENCOUNTER — Ambulatory Visit: Payer: Medicare HMO

## 2020-06-18 ENCOUNTER — Encounter: Payer: Medicare HMO | Admitting: Family Medicine

## 2020-06-21 DIAGNOSIS — G4733 Obstructive sleep apnea (adult) (pediatric): Secondary | ICD-10-CM | POA: Diagnosis not present

## 2020-06-27 ENCOUNTER — Telehealth: Payer: Self-pay | Admitting: Family Medicine

## 2020-06-27 NOTE — Telephone Encounter (Signed)
Patient is rescheduled

## 2020-07-10 ENCOUNTER — Other Ambulatory Visit: Payer: Self-pay

## 2020-07-10 ENCOUNTER — Ambulatory Visit (INDEPENDENT_AMBULATORY_CARE_PROVIDER_SITE_OTHER): Payer: Medicare HMO

## 2020-07-10 DIAGNOSIS — Z0000 Encounter for general adult medical examination without abnormal findings: Secondary | ICD-10-CM | POA: Diagnosis not present

## 2020-07-10 NOTE — Progress Notes (Signed)
Subjective:   Jack Norris is a 79 y.o. male who presents for Medicare Annual/Subsequent preventive examination.  Review of Systems: N/A     I connected with the patient today by telephone and verified that I am speaking with the correct person using two identifiers. Location patient: home Location nurse: work Persons participating in the telephone visit: patient, nurse.   I discussed the limitations, risks, security and privacy concerns of performing an evaluation and management service by telephone and the availability of in person appointments. I also discussed with the patient that there may be a patient responsible charge related to this service. The patient expressed understanding and verbally consented to this telephonic visit.        Cardiac Risk Factors include: advanced age (>76men, >73 women);male gender;hypertension;Other (see comment), Risk factor comments: hypercholesterolemia     Objective:    Today's Vitals   There is no height or weight on file to calculate BMI.  Advanced Directives 07/10/2020 06/15/2019 04/22/2018 04/08/2018 04/20/2017 04/16/2016 09/08/2012  Does Patient Have a Medical Advance Directive? No No No No No No Patient does not have advance directive;Patient would not like information  Would patient like information on creating a medical advance directive? No - Patient declined No - Patient declined No - Patient declined No - Patient declined - Yes - Educational materials given -  Pre-existing out of facility DNR order (yellow form or pink MOST form) - - - - - - -    Current Medications (verified) Outpatient Encounter Medications as of 07/10/2020  Medication Sig  . albuterol (PROAIR HFA) 108 (90 Base) MCG/ACT inhaler Inhale 2 puffs into the lungs every 6 (six) hours as needed for wheezing or shortness of breath.  Marland Kitchen amLODipine (NORVASC) 5 MG tablet Take 1 tablet (5 mg total) by mouth daily.  Marland Kitchen aspirin 81 MG tablet Take 81 mg by mouth daily.   . B-D  3CC LUER-LOK SYR 18GX1-1/2 18G X 1-1/2" 3 ML MISC USE ONCE A WEEK AS DIRECTED  . fenofibrate micronized (LOFIBRA) 67 MG capsule Take 1 capsule (67 mg total) by mouth daily before breakfast.  . latanoprost (XALATAN) 0.005 % ophthalmic solution Place 1 drop into the right eye at bedtime.  Marland Kitchen lisinopril (ZESTRIL) 40 MG tablet Take 1 tablet (40 mg total) by mouth daily.  . meclizine (ANTIVERT) 25 MG tablet as needed.  Marland Kitchen omeprazole (PRILOSEC) 40 MG capsule daily.  Marland Kitchen oxybutynin (DITROPAN) 5 MG tablet Take 5 mg by mouth daily as needed. Overactive bladder  . psyllium (METAMUCIL) 58.6 % powder Take 1 packet by mouth daily.  . rosuvastatin (CRESTOR) 5 MG tablet Take 1 tablet (5 mg total) by mouth daily.  Marland Kitchen testosterone cypionate (DEPOTESTOSTERONE CYPIONATE) 200 MG/ML injection Inject 0.5 mLs (100 mg total) into the muscle every 7 (seven) days.  Marland Kitchen triamcinolone (KENALOG) 0.025 % cream Apply 1 application topically as needed.   No facility-administered encounter medications on file as of 07/10/2020.    Allergies (verified) Paroxetine, Morphine and related, Ciprofloxacin, and Flagyl [metronidazole hcl]   History: Past Medical History:  Diagnosis Date  . Asthma, mild    as child-only now if has URI  . BPH (benign prostatic hypertrophy)   . BPPV (benign paroxysmal positional vertigo)   . Diverticulosis   . Eczema   . Fatty liver disease, nonalcoholic   . Frequent nosebleeds    History of  . GERD (gastroesophageal reflux disease)    occ uses TUMS as needed  . Glaucoma   .  History of bronchitis   . History of colon polyps   . History of diverticulitis   . History of kidney stones   . Hyperlipemia   . Hypertension   . Influenza A 11/2017  . Memory changes   . OA (osteoarthritis)   . Obesity   . Overactive bladder 2020  . PVCs (premature ventricular contractions)   . Renal calculi right    AND LEFT X1 NON-OBSTRUCTIVE  . Right sided sciatica    Bilateral buttock and thighs  . Simple  renal cyst right interpoler, simpler in nature , asymptomatic  per urologist note (dr dahlstedt)  12-26-2011   BENIGN, has been drained  . Testosterone deficiency   . Wears glasses   . Wears hearing aid    Past Surgical History:  Procedure Laterality Date  . APPENDECTOMY    . bone spur removal     BILATERAL GREAT TOE  . COLONOSCOPY    . CYSTO/ RIGHT RETROGRADE PYELOGRAM/ RIGHT URETER DILATATION/ RIGHT URETEROSCOPY/ LASER LITHOTRIPSY RIGHT RENAL PELVIS AND LOWER POLE CALCULI/ PLACEMENT STENT  01-11-2012  DR DAHLSTEDT   RIGHT RENAL PELVIS AND LOWER POLE CALCULI  . CYSTOSCOPY W/ RETROGRADES  06/02/2012   Procedure: CYSTOSCOPY WITH RETROGRADE PYELOGRAM;  Surgeon: Franchot Gallo, MD;  [facility]  Service: Urology;  Laterality: Right;  . CYSTOSCOPY WITH URETEROSCOPY  06/02/2012   Procedure: CYSTOSCOPY WITH URETEROSCOPY;  Surgeon: Franchot Gallo, MD;  [facility];  Service: Urology;  Laterality: Right;  90 MIN ALSO EXTRACTION OF CALCULI   . EYE SURGERY Bilateral 2020  . LITHOTRIPSY  APPROX.  1998   ESWL  left kidney x 2  . ROTATOR CUFF REPAIR  2012   LEFT SHOULDER  . SHOULDER ARTHROSCOPY WITH ROTATOR CUFF REPAIR AND SUBACROMIAL DECOMPRESSION  09/12/2012   Procedure: SHOULDER ARTHROSCOPY WITH ROTATOR CUFF REPAIR AND SUBACROMIAL DECOMPRESSION;  Surgeon: Nita Sells, MD;  Location: [place];  Service: Orthopedics;  Laterality: Right;  see above and biceps tendonotomy  . TRANSURETHRAL RESECTION OF BLADDER NECK N/A 04/08/2018   Procedure: TRANSURETHRAL RESECTION OF BLADDER NECK CONTRACTURE;  Surgeon: Franchot Gallo, MD;  Location: [facility];  Service: Urology;  Laterality: N/A;  . TRANSURETHRAL RESECTION OF PROSTATE  11-29-2009   BPH  . VASECTOMY  1970'S   GEN. ANES.   Family History  Problem Relation Age of Onset  . Cancer Mother        breast and kidney   . Squamous cell carcinoma  Mother   . Cancer Father        lung  . Heart disease Father    Social History   Socioeconomic History  . Marital status: Married    Spouse name: Not on file  . Number of children: 3  . Years of education: college  . Highest education level: Bachelor's degree (e.g., BA, AB, BS)  Occupational History  . Occupation: QUOTATION ENGINEER    Employer: [employer]  Tobacco Use  . Smoking status: Former Smoker    Years: 8.00    Types: Cigarettes    Quit date: 01/05/1967    Years since quitting: 53.5  . Smokeless tobacco: Never Used  . Tobacco comment: QUIT AGE 66  Vaping Use  . Vaping Use: Never used  Substance and Sexual Activity  . Alcohol use: Yes    Alcohol/week: 0.0 standard drinks    Comment: OCCASIONAL  . Drug use: No  . Sexual activity: Yes  Other Topics  Concern  . Not on file  Social History Narrative   regular exercise    Full code, (Reviewed 2014)      Lives at home with his wife.   Right-handed.   Caffeine use: 4 cups per day.   Social Determinants of Health   Financial Resource Strain: Low Risk   . Difficulty of Paying Living Expenses: Not hard at all  Food Insecurity: No Food Insecurity  . Worried About Charity fundraiser in the Last Year: Never true  . Ran Out of Food in the Last Year: Never true  Transportation Needs: No Transportation Needs  . Lack of Transportation (Medical): No  . Lack of Transportation (Non-Medical): No  Physical Activity: Sufficiently Active  . Days of Exercise per Week: 3 days  . Minutes of Exercise per Session: 60 min  Stress: No Stress Concern Present  . Feeling of Stress : Not at all  Social Connections:   . Frequency of Communication with Friends and Family: Not on file  . Frequency of Social Gatherings with Friends and Family: Not on file  . Attends Religious Services: Not on file  . Active Member of Clubs or Organizations: Not on file  . Attends Archivist Meetings: Not on file  . Marital Status: Not on  file    Tobacco Counseling Counseling given: Not Answered Comment: QUIT AGE 69   Clinical Intake:  Pre-visit preparation completed: Yes  Pain : No/denies pain     Nutritional Risks: None Diabetes: No  How often do you need to have someone help you when you read instructions, pamphlets, or other written materials from your doctor or pharmacy?: 1 - Never What is the last grade level you completed in school?: bachelors  Diabetic: No Nutrition Risk Assessment:  Has the patient had any N/V/D within the last 2 months?  No  Does the patient have any non-healing wounds?  No  Has the patient had any unintentional weight loss or weight gain?  No   Diabetes:  Is the patient diabetic?  No  If diabetic, was a CBG obtained today?  N/A Did the patient bring in their glucometer from home?  N/A How often do you monitor your CBG's? N/A.   Financial Strains and Diabetes Management:  Are you having any financial strains with the device, your supplies or your medication? N/A.  Does the patient want to be seen by Chronic Care Management for management of their diabetes?  N/A Would the patient like to be referred to a Nutritionist or for Diabetic Management?  N/A     Interpreter Needed?: No  Information entered by :: CJohnson, LPN   Activities of Daily Living In your present state of health, do you have any difficulty performing the following activities: 07/10/2020  Hearing? Y  Comment high frequency loss, wears hearing aids  Vision? N  Difficulty concentrating or making decisions? Y  Comment some mild memory loss noted  Walking or climbing stairs? N  Dressing or bathing? N  Doing errands, shopping? N  Preparing Food and eating ? N  Using the Toilet? N  In the past six months, have you accidently leaked urine? Y  Comment has overactive bladder  Do you have problems with loss of bowel control? N  Managing your Medications? N  Managing your Finances? N  Housekeeping or  managing your Housekeeping? N  Some recent data might be hidden    Patient Care Team: Jinny Sanders, MD as PCP - [place],  Charlann Lange, MD as PCP - Cardiology (Cardiology) Clent Jacks, MD as Consulting Physician (Ophthalmology) Franchot Gallo, MD as Consulting Physician (Urology) Jannet Mantis, MD as Consulting Physician (Dermatology)  Indicate any recent Medical Services you may have received from other than [facility] providers in the past year (date may be approximate).     Assessment:   This is a routine wellness examination for Vu.  Hearing/Vision screen  Hearing Screening   125Hz  250Hz  500Hz  1000Hz  2000Hz  3000Hz  4000Hz  6000Hz  8000Hz   Right ear:           Left ear:           Vision Screening Comments: Patient gets annual eye exams.   Dietary issues and exercise activities discussed: Current Exercise Habits: Structured exercise class, Type of exercise: strength training/weights, Time (Minutes): 60, Frequency (Times/Week): 3, Weekly Exercise (Minutes/Week): 180, Intensity: Moderate, Exercise limited by: None identified  Goals    . Increase physical activity     Starting 04/22/2018, I will continue to walk at least 60 minutes 2-3 days per week.     . Patient Stated     06/15/2019, I will try to start back walking 2-3 miles daily.     . Patient Stated     07/10/2020, I will continue to do exercises with my personal trainer 3 days a week for 1 hour.       Depression Screen PHQ 2/9 Scores 07/10/2020 06/15/2019 04/22/2018 04/20/2017 04/16/2016 04/15/2015 02/06/2014  PHQ - 2 Score 0 0 0 0 0 0 0  PHQ- 9 Score 0 0 0 - - - -    Fall Risk Fall Risk  07/10/2020 06/15/2019 04/22/2018 04/20/2017 04/16/2016  Falls in the past year? 0 0 No No No  Number falls in past yr: 0 0 - - -  Injury with Fall? 0 0 - - -  Risk for fall due to : - Medication side effect;Impaired balance/gait - - -  Follow up Falls evaluation completed;Falls prevention discussed Falls evaluation  completed;Falls prevention discussed - - -    Any stairs in or around the home? Yes  If so, are there any without handrails? No  Home free of loose throw rugs in walkways, pet beds, electrical cords, etc? Yes  Adequate lighting in your home to reduce risk of falls? Yes   ASSISTIVE DEVICES UTILIZED TO PREVENT FALLS:  Life alert? No  Use of a cane, walker or w/c? No  Grab bars in the bathroom? No  Shower chair or bench in shower? No  Elevated toilet seat or a handicapped toilet? No   TIMED UP AND GO:  Was the test performed? N/A, telephone visit.   Cognitive Function: MMSE - Mini Mental State Exam 07/10/2020 07/04/2019 06/15/2019 04/22/2018 04/20/2017  Orientation to time 5 5 5 5 5   Orientation to Place 5 5 5 5 5   Registration 3 3 3 3 3   Attention/ Calculation 5 5 5  0 0  Recall 3 2 3 3 3   Language- name 2 objects - 2 - 0 0  Language- repeat 1 1 1 1 1   Language- follow 3 step command - 3 - 3 3  Language- read & follow direction - 1 - 0 0  Write a sentence - 1 - 0 0  Copy design - 1 - 0 0  Total score - 29 - 20 20  Mini Cog  Mini-Cog screen was completed. Maximum score is 22. A value of 0 denotes this part of the MMSE was not  completed or the patient failed this part of the Mini-Cog screening.       Immunizations Immunization History  Administered Date(s) Administered  . Fluad Quad(high Dose 65+) 06/16/2019  . Influenza Split 06/26/2011, 07/11/2012  . Influenza Whole 05/24/2008, 06/24/2008, 07/04/2009, 05/24/2010  . Influenza, High Dose Seasonal PF 06/14/2018  . Influenza,inj,Quad PF,6+ Mos 07/26/2013, 06/12/2014, 04/15/2015, 04/16/2016, 07/01/2017  . PFIZER SARS-COV-2 Vaccination 10/04/2019, 10/25/2019, 06/07/2020  . Pneumococcal Conjugate-13 04/15/2015  . Pneumococcal Polysaccharide-23 09/14/2007  . Td 08/02/2007  . Zoster 04/08/2009    TDAP status: Due, Education has been provided regarding the importance of this vaccine. Advised may receive this vaccine at local  pharmacy or Health Dept. Aware to provide a copy of the vaccination record if obtained from local pharmacy or Health Dept. Verbalized acceptance and understanding. Flu Vaccine status: due, will get at upcoming office visit Pneumococcal vaccine status: Up to date Covid-19 vaccine status: Completed vaccines  Qualifies for Shingles Vaccine? Yes   Zostavax completed Yes   Shingrix Completed?: No.    Education has been provided regarding the importance of this vaccine. Patient has been advised to call insurance company to determine out of pocket expense if they have not yet received this vaccine. Advised may also receive vaccine at local pharmacy or Health Dept. Verbalized acceptance and understanding.  Screening Tests Health Maintenance  Topic Date Due  . Hepatitis C Screening  Never done  . INFLUENZA VACCINE  03/24/2020  . TETANUS/TDAP  07/10/2024 (Originally 08/01/2017)  . COVID-19 Vaccine  Completed  . PNA vac Low Risk Adult  Completed    Health Maintenance  Health Maintenance Due  Topic Date Due  . Hepatitis C Screening  Never done  . INFLUENZA VACCINE  03/24/2020    Colorectal cancer screening: No longer required.   Lung Cancer Screening: (Low Dose CT Chest recommended if Age 29-80 years, 30 pack-year currently smoking OR have quit w/in 15 years.) does not qualify.    Additional Screening:  Hepatitis C Screening: does qualify; Completed due  Vision Screening: Recommended annual ophthalmology exams for early detection of glaucoma and other disorders of the eye. Is the patient up to date with their annual eye exam?  Yes  Who is the provider or what is the name of the office in which the patient attends annual eye exams? Dr. Katy Fitch  If pt is not established with a provider, would they like to be referred to a provider to establish care? No .   Dental Screening: Recommended annual dental exams for proper oral hygiene  Community Resource Referral / Chronic Care Management: CRR  required this visit?  No   CCM required this visit?  No      Plan:     I have personally reviewed and noted the following in the patient's chart:   . Medical and social history . Use of alcohol, tobacco or illicit drugs  . Current medications and supplements . Functional ability and status . Nutritional status . Physical activity . Advanced directives . List of other physicians . Hospitalizations, surgeries, and ER visits in previous 12 months . Vitals . Screenings to include cognitive, depression, and falls . Referrals and appointments  In addition, I have reviewed and discussed with patient certain preventive protocols, quality metrics, and best practice recommendations. A written personalized care plan for preventive services as well as general preventive health recommendations were provided to patient.   Due to this being a telephonic visit, the after visit summary with patients personalized plan was  offered to patient via office or my-chart. Patient preferred to pick up at office at next visit or via mychart.   Andrez Grime, LPN   27/01/2375

## 2020-07-10 NOTE — Progress Notes (Signed)
PCP notes:  Health Maintenance: Flu- due Tdap- insurance   Abnormal Screenings: none   Patient concerns: Neck pain onset several months    Nurse concerns: none   Next PCP appt.: 08/02/2020 @ 11:40 am

## 2020-07-10 NOTE — Patient Instructions (Signed)
[name] , Thank you for taking time to come for your Medicare Wellness Visit. I appreciate your ongoing commitment to your health goals. Please review the following plan we discussed and let me know if I can assist you in the future.   Screening recommendations/referrals: Colonoscopy: no longer required Recommended yearly ophthalmology/optometry visit for glaucoma screening and checkup Recommended yearly dental visit for hygiene and checkup  Vaccinations: Influenza vaccine: due, will get at upcoming office visit  Pneumococcal vaccine: Completed series Tdap vaccine: decline/insurance Shingles vaccine: due, check with your insurance regarding coverage/cost if interested    Covid-19: Completed series  Advanced directives: Advance directive discussed with you today. Even though you declined this today please call our office should you change your mind and we can give you the proper paperwork for you to fill out.   Conditions/risks identified: hypertension, hypercholesterolemia  Next appointment: Follow up in one year for your annual wellness visit.   Preventive Care 11 Years and Older, Male Preventive care refers to lifestyle choices and visits with your health care provider that can promote health and wellness. What does preventive care include?  A yearly physical exam. This is also called an annual well check.  Dental exams once or twice a year.  Routine eye exams. Ask your health care provider how often you should have your eyes checked.  Personal lifestyle choices, including:  Daily care of your teeth and gums.  Regular physical activity.  Eating a healthy diet.  Avoiding tobacco and drug use.  Limiting alcohol use.  Practicing safe sex.  Taking low doses of aspirin every day.  Taking vitamin and mineral supplements as recommended by your health care provider. What happens during an annual well check? The services and screenings done by your health care  provider during your annual well check will depend on your age, overall health, lifestyle risk factors, and family history of disease. Counseling  Your health care provider may ask you questions about your:  Alcohol use.  Tobacco use.  Drug use.  Emotional well-being.  Home and relationship well-being.  Sexual activity.  Eating habits.  History of falls.  Memory and ability to understand (cognition).  Work and work Statistician. Screening  You may have the following tests or measurements:  Height, weight, and BMI.  Blood pressure.  Lipid and cholesterol levels. These may be checked every 5 years, or more frequently if you are over 78 years old.  Skin check.  Lung cancer screening. You may have this screening every year starting at age 28 if you have a 30-pack-year history of smoking and currently smoke or have quit within the past 15 years.  Fecal occult blood test (FOBT) of the stool. You may have this test every year starting at age 72.  Flexible sigmoidoscopy or colonoscopy. You may have a sigmoidoscopy every 5 years or a colonoscopy every 10 years starting at age 56.  Prostate cancer screening. Recommendations will vary depending on your family history and other risks.  Hepatitis C blood test.  Hepatitis B blood test.  Sexually transmitted disease (STD) testing.  Diabetes screening. This is done by checking your blood sugar (glucose) after you have not eaten for a while (fasting). You may have this done every 1-3 years.  Abdominal aortic aneurysm (AAA) screening. You may need this if you are a current or former smoker.  Osteoporosis. You may be screened starting at age 49 if you are at high risk. Talk with your health care provider about your test  results, treatment options, and if necessary, the need for more tests. Vaccines  Your health care provider may recommend certain vaccines, such as:  Influenza vaccine. This is recommended every year.  Tetanus,  diphtheria, and acellular pertussis (Tdap, Td) vaccine. You may need a Td booster every 10 years.  Zoster vaccine. You may need this after age 17.  Pneumococcal 13-valent conjugate (PCV13) vaccine. One dose is recommended after age 53.  Pneumococcal polysaccharide (PPSV23) vaccine. One dose is recommended after age 56. Talk to your health care provider about which screenings and vaccines you need and how often you need them. This information is not intended to replace advice given to you by your health care provider. Make sure you discuss any questions you have with your health care provider. Document Released: 09/06/2015 Document Revised: 04/29/2016 Document Reviewed: 06/11/2015 Elsevier Interactive Patient Education  2017 [employer] Prevention in the Home Falls can cause injuries. They can happen to people of all ages. There are many things you can do to make your home safe and to help prevent falls. What can I do on the outside of my home?  Regularly fix the edges of walkways and driveways and fix any cracks.  Remove anything that might make you trip as you walk through a door, such as a raised step or threshold.  Trim any bushes or trees on the path to your home.  Use bright outdoor lighting.  Clear any walking paths of anything that might make someone trip, such as rocks or tools.  Regularly check to see if handrails are loose or broken. Make sure that both sides of any steps have handrails.  Any raised decks and porches should have guardrails on the edges.  Have any leaves, snow, or ice cleared regularly.  Use sand or salt on walking paths during winter.  Clean up any spills in your garage right away. This includes oil or grease spills. What can I do in the bathroom?  Use night lights.  Install grab bars by the toilet and in the tub and shower. Do not use towel bars as grab bars.  Use non-skid mats or decals in the tub or shower.  If you need to sit down in  the shower, use a plastic, non-slip stool.  Keep the floor dry. Clean up any water that spills on the floor as soon as it happens.  Remove soap buildup in the tub or shower regularly.  Attach bath mats securely with double-sided non-slip rug tape.  Do not have throw rugs and other things on the floor that can make you trip. What can I do in the bedroom?  Use night lights.  Make sure that you have a light by your bed that is easy to reach.  Do not use any sheets or blankets that are too big for your bed. They should not hang down onto the floor.  Have a firm chair that has side arms. You can use this for support while you get dressed.  Do not have throw rugs and other things on the floor that can make you trip. What can I do in the kitchen?  Clean up any spills right away.  Avoid walking on wet floors.  Keep items that you use a lot in easy-to-reach places.  If you need to reach something above you, use a strong step stool that has a grab bar.  Keep electrical cords out of the way.  Do not use floor polish or wax that makes  floors slippery. If you must use wax, use non-skid floor wax.  Do not have throw rugs and other things on the floor that can make you trip. What can I do with my stairs?  Do not leave any items on the stairs.  Make sure that there are handrails on both sides of the stairs and use them. Fix handrails that are broken or loose. Make sure that handrails are as long as the stairways.  Check any carpeting to make sure that it is firmly attached to the stairs. Fix any carpet that is loose or worn.  Avoid having throw rugs at the top or bottom of the stairs. If you do have throw rugs, attach them to the floor with carpet tape.  Make sure that you have a light switch at the top of the stairs and the bottom of the stairs. If you do not have them, ask someone to add them for you. What else can I do to help prevent falls?  Wear shoes that:  Do not have high  heels.  Have rubber bottoms.  Are comfortable and fit you well.  Are closed at the toe. Do not wear sandals.  If you use a stepladder:  Make sure that it is fully opened. Do not climb a closed stepladder.  Make sure that both sides of the stepladder are locked into place.  Ask someone to hold it for you, if possible.  Clearly mark and make sure that you can see:  Any grab bars or handrails.  First and last steps.  Where the edge of each step is.  Use tools that help you move around (mobility aids) if they are needed. These include:  Canes.  Walkers.  Scooters.  Crutches.  Turn on the lights when you go into a dark area. Replace any light bulbs as soon as they burn out.  Set up your furniture so you have a clear path. Avoid moving your furniture around.  If any of your floors are uneven, fix them.  If there are any pets around you, be aware of where they are.  Review your medicines with your doctor. Some medicines can make you feel dizzy. This can increase your chance of falling. Ask your doctor what other things that you can do to help prevent falls. This information is not intended to replace advice given to you by your health care provider. Make sure you discuss any questions you have with your health care provider. Document Released: 06/06/2009 Document Revised: 01/16/2016 Document Reviewed: 09/14/2014 Elsevier Interactive Patient Education  2017 [employer].

## 2020-07-16 ENCOUNTER — Encounter: Payer: Medicare HMO | Admitting: Family Medicine

## 2020-08-02 ENCOUNTER — Other Ambulatory Visit: Payer: Self-pay

## 2020-08-02 ENCOUNTER — Ambulatory Visit (INDEPENDENT_AMBULATORY_CARE_PROVIDER_SITE_OTHER): Payer: Medicare HMO | Admitting: Family Medicine

## 2020-08-02 ENCOUNTER — Encounter: Payer: Self-pay | Admitting: Family Medicine

## 2020-08-02 VITALS — BP 142/80 | HR 85 | Temp 97.8°F | Ht 69.0 in | Wt 222.0 lb

## 2020-08-02 DIAGNOSIS — M542 Cervicalgia: Secondary | ICD-10-CM | POA: Insufficient documentation

## 2020-08-02 DIAGNOSIS — Z23 Encounter for immunization: Secondary | ICD-10-CM

## 2020-08-02 DIAGNOSIS — R911 Solitary pulmonary nodule: Secondary | ICD-10-CM

## 2020-08-02 DIAGNOSIS — Z0000 Encounter for general adult medical examination without abnormal findings: Secondary | ICD-10-CM

## 2020-08-02 DIAGNOSIS — Z6832 Body mass index (BMI) 32.0-32.9, adult: Secondary | ICD-10-CM | POA: Diagnosis not present

## 2020-08-02 DIAGNOSIS — G3184 Mild cognitive impairment, so stated: Secondary | ICD-10-CM

## 2020-08-02 DIAGNOSIS — E7800 Pure hypercholesterolemia, unspecified: Secondary | ICD-10-CM | POA: Diagnosis not present

## 2020-08-02 DIAGNOSIS — I10 Essential (primary) hypertension: Secondary | ICD-10-CM

## 2020-08-02 DIAGNOSIS — IMO0001 Reserved for inherently not codable concepts without codable children: Secondary | ICD-10-CM

## 2020-08-02 DIAGNOSIS — L299 Pruritus, unspecified: Secondary | ICD-10-CM

## 2020-08-02 NOTE — Patient Instructions (Addendum)
Keep up with moisturizing cream.  Increase water intake.  Decrease to omeprazole to 20 mg daily ( OTC).Marland Kitchen to see if itching is improved.  Start range of motion exercises to stretch neck.  Follow BP at home make sure not running > 140/90 consistently.

## 2020-08-02 NOTE — Assessment & Plan Note (Signed)
New, mild No red flags, no lymph nodes or mass.  Likely MSK strain of SCM.  Treat with gentle stretching.

## 2020-08-02 NOTE — Assessment & Plan Note (Addendum)
New, mild  Hx of ichtyosis Very dry skin. Increase water intake and continue moisturizer. Decrease or hold omeprazole ans may be causing skin itching.  No lab cause of symptoms seen on CMET.

## 2020-08-02 NOTE — Assessment & Plan Note (Signed)
Borderline control on lisinopril 40 mg daily , amlodipine 5 mg daily. Good control at home.

## 2020-08-02 NOTE — Assessment & Plan Note (Signed)
Plan repeat in 11/2020

## 2020-08-02 NOTE — Assessment & Plan Note (Signed)
Stable, chronic.  Continue current medication.    

## 2020-08-02 NOTE — Assessment & Plan Note (Addendum)
Stable, chronic.  Continue current medication. See current medication list in objective section of note.

## 2020-08-02 NOTE — Progress Notes (Signed)
Chief Complaint  Patient presents with  . Annual Exam  AND review of chronic health problems.  History of Present Illness: HPI  The patient presents for  complete physical and review of chronic health problems. He/She also has the following acute concerns today:  1.neck pain, acute: Ongoing since [address]21. Started before lifting weights. Pain in in left anterior lateral neck.. pain is mild  (2/10)but constant, Ache.. feels worse with pressing and better with movement of neck. Saw ENT: negative laryngoscopy  Started PPI.  2. itchy dry skin, new: no rash, ongoing x 1 month, no new exposures, no new meds.  Using Ceravae daily.  Hx of eczema  The patient saw a LPN or RN for medicare wellness visit.  Prevention and wellness was reviewed in detail. Note reviewed and important notes copied below.  Health Maintenance: Flu- due Tdap- insurance   Abnormal Screenings: none  08/02/20  Hypertension:Borderline control on lisinopril 40 mg daily , amlodipine 5 mg daily BP Readings from Last 3 Encounters:  08/02/20 (!) 142/80  05/28/20 (!) 142/70  03/15/20 (!) 136/70  Using medication without problems or lightheadedness: none Chest pain with exertion:none Edema:none Short of breath:none Average home BPs: < 140/90 at home Other issues:  Elevated Cholesterol: At goal LDL < 70 on rosuvastatin and fenofibrate Lab Results  Component Value Date   CHOL 129 06/10/2020   HDL 39.80 06/10/2020   LDLCALC 69 06/10/2020   LDLDIRECT 107.6 12/14/2011   TRIG 104.0 06/10/2020   CHOLHDL 3 06/10/2020  Using medications without problems: Muscle aches:  Diet compliance: Exercise: seeing personal trainer 3 times a week for an hour, treadmill 20 min Wt Readings from Last 3 Encounters:  08/02/20 222 lb (100.7 kg)  05/28/20 215 lb (97.5 kg)  03/15/20 (!) 222 lb (100.7 kg)  Other complaints:  Lung nodules < 6 cm: noted 2019, 11/2018 3 month follow up stable...repeat recommended in 18-24 months...  plan 11/2020  Mild cognitive impairment: Reviewed OV note from neurology 06/2019, 10/10/2019  Dr. Krista Blue MRI of the brain with thin cuts through internal acoustic canal, with without contrast showed no significant abnormality             Laboratory showed normal TSH, B12, no treatable etiology identified Recommended exercise no other treatment at this time.  Derm Dr. Phillip Heal removed 2 basal cell CA from face.  Patient Care Team: Jinny Sanders, MD as PCP - General Jettie Booze, MD as PCP - Cardiology (Cardiology) Clent Jacks, MD as Consulting Physician (Ophthalmology) Franchot Gallo, MD as Consulting Physician (Urology) Jannet Mantis, MD as Consulting Physician (Dermatology)   This visit occurred during the SARS-CoV-2 public health emergency.  Safety protocols were in place, including screening questions prior to the visit, additional usage of staff PPE, and extensive cleaning of exam room while observing appropriate contact time as indicated for disinfecting solutions.   COVID 19 screen:  No recent travel or known exposure to COVID19 The patient denies respiratory symptoms of COVID 19 at this time. The importance of social distancing was discussed today.    Review of Systems  Constitutional: Negative for chills and fever.  HENT: Negative for congestion and ear pain.   Eyes: Negative for pain and redness.  Respiratory: Negative for cough and shortness of breath.   Cardiovascular: Negative for chest pain, palpitations and leg swelling.  Gastrointestinal: Negative for abdominal pain, blood in stool, constipation, diarrhea, nausea and vomiting.  Genitourinary: Negative for dysuria.  Musculoskeletal: Negative for falls and myalgias.  Skin: Negative for rash.  Neurological: Negative for dizziness.  Psychiatric/Behavioral: Negative for depression. The patient is not nervous/anxious.       Past Medical History:  Diagnosis Date  . Asthma, mild    as child-only now if  has URI  . BPH (benign prostatic hypertrophy)   . BPPV (benign paroxysmal positional vertigo)   . Diverticulosis   . Eczema   . Fatty liver disease, nonalcoholic   . Frequent nosebleeds    History of  . GERD (gastroesophageal reflux disease)    occ uses TUMS as needed  . Glaucoma   . History of bronchitis   . History of colon polyps   . History of diverticulitis   . History of kidney stones   . Hyperlipemia   . Hypertension   . Influenza A 11/2017  . Memory changes   . OA (osteoarthritis)   . Obesity   . Overactive bladder 2020  . PVCs (premature ventricular contractions)   . Renal calculi right    AND LEFT X1 NON-OBSTRUCTIVE  . Right sided sciatica    Bilateral buttock and thighs  . Simple renal cyst right interpoler, simpler in nature , asymptomatic  per urologist note (dr dahlstedt)  12-26-2011   BENIGN, has been drained  . Testosterone deficiency   . Wears glasses   . Wears hearing aid     reports that he quit smoking about 53 years ago. His smoking use included cigarettes. He quit after 8.00 years of use. He has never used smokeless tobacco. He reports current alcohol use. He reports that he does not use drugs.   Observations/Objective: Blood pressure (!) 142/80, pulse 85, temperature 97.8 F (36.6 C), temperature source Temporal, height 5\' 9"  (1.753 m), weight 222 lb (100.7 kg), SpO2 98 %.  Physical Exam Constitutional:      General: He is not in acute distress.    Appearance: Normal appearance. He is well-developed and well-nourished. He is not ill-appearing or toxic-appearing.  HENT:     Head: Normocephalic and atraumatic.     Right Ear: Hearing, tympanic membrane, ear canal and external ear normal.     Left Ear: Hearing, tympanic membrane, ear canal and external ear normal.     Nose: Nose normal.     Mouth/Throat:     Mouth: Oropharynx is clear and moist and mucous membranes are normal.     Pharynx: Uvula midline.  Eyes:     General: Lids are normal. Lids  are everted, no foreign bodies appreciated.     Extraocular Movements: EOM normal.     Conjunctiva/sclera: Conjunctivae normal.     Pupils: Pupils are equal, round, and reactive to light.  Neck:     Thyroid: No thyroid mass or thyromegaly.     Vascular: Normal carotid pulses. No carotid bruit or hepatojugular reflux.     Trachea: Trachea and phonation normal. No tracheal tenderness.     Comments: ttp anterior to left SCM muscle Cardiovascular:     Rate and Rhythm: Normal rate and regular rhythm.     Pulses: Normal pulses and intact distal pulses.     Heart sounds: S1 normal and S2 normal. No murmur heard. No gallop.   Pulmonary:     Breath sounds: Normal breath sounds. No wheezing, rhonchi or rales.  Abdominal:     General: Bowel sounds are normal.     Palpations: Abdomen is soft. There is no hepatosplenomegaly.     Tenderness: There is no abdominal tenderness. There is  no CVA tenderness, guarding or rebound.     Hernia: No hernia is present.  Musculoskeletal:     Cervical back: Full passive range of motion without pain, normal range of motion and neck supple. No edema, erythema, signs of trauma, rigidity, torticollis or crepitus. No pain with movement, spinous process tenderness or muscular tenderness. Normal range of motion.  Lymphadenopathy:     Cervical: No cervical adenopathy.     Right cervical: No superficial, deep or posterior cervical adenopathy.    Left cervical: No superficial, deep or posterior cervical adenopathy.  Skin:    General: Skin is warm, dry and intact.     Coloration: Skin is ashen.     Findings: Rash present.     Comments: Very dry skin thoughout  Neurological:     Mental Status: He is alert.     Cranial Nerves: No cranial nerve deficit.     Sensory: No sensory deficit.     Gait: Gait normal.     Deep Tendon Reflexes: Strength normal and reflexes are normal and symmetric.  Psychiatric:        Mood and Affect: Mood and affect normal.        Speech:  Speech normal.        Behavior: Behavior normal.        Judgment: Judgment normal.      Assessment and Plan The patient's preventative maintenance and recommended screening tests for an annual wellness exam were reviewed in full today. Brought up to date unless services declined.  Counselled on the importance of diet, exercise, and its role in overall health and mortality. The patient's FH and SH was reviewed, including their home life, tobacco status, and drug and alcohol status.   Vaccines: Uptodate with flu, COVID x 3 , PNA,  Consider shingrix Colon: 05/2013, [facility] nml, no further indicated. Prostate: per uro. PSA per Dr. Diona Fanti  Former smoker, remotely.   Hep C plan next lab check.  Essential hypertension Borderline control on lisinopril 40 mg daily , amlodipine 5 mg daily. Good control at home.  Benign essential hypertension Stable, chronic.  Continue current medication.     HYPERCHOLESTEROLEMIA Stable, chronic.  Continue current medication. See current medication list in objective section of note.     Itching New, mild  Hx of ichtyosis Very dry skin. Increase water intake and continue moisturizer. Decrease or hold omeprazole ans may be causing skin itching.  No lab cause of symptoms seen on CMET.  Lung nodule < 6cm on CT Plan repeat in 11/2020  Mild cognitive impairment with memory loss Followed by Dr. Krista Blue. Last note reviewed in detail.  Anterior neck pain New, mild No red flags, no lymph nodes or mass.  Likely MSK strain of SCM.  Treat with gentle stretching.    Eliezer Lofts, MD

## 2020-08-02 NOTE — Assessment & Plan Note (Signed)
Followed by Dr. Krista Blue. Last note reviewed in detail.

## 2020-08-14 DIAGNOSIS — I10 Essential (primary) hypertension: Secondary | ICD-10-CM | POA: Diagnosis not present

## 2020-08-14 DIAGNOSIS — G4733 Obstructive sleep apnea (adult) (pediatric): Secondary | ICD-10-CM | POA: Diagnosis not present

## 2020-08-28 ENCOUNTER — Ambulatory Visit: Payer: Medicare HMO | Admitting: Interventional Cardiology

## 2020-09-10 NOTE — Progress Notes (Signed)
Cardiology Office Note   Date:  09/12/2020   ID:  Jack Norris, DOB 1941/07/12, MRN [id number]  PCP:  Jinny Sanders, MD    No chief complaint on file.  CAD  Wt Readings from Last 3 Encounters:  09/12/20 223 lb 6.4 oz (101.3 kg)  08/02/20 222 lb (100.7 kg)  05/28/20 215 lb (97.5 kg)       History of Present Illness: Jack Norris is a 80 y.o. male  With hyperlipidemia. He has been on lopid for years.   He has had palpitations in the past. No management with a cardiologist at that time. Sx have resolved.  He has lost weightin the past.  His father had CAD, CABG x 4, chronic smoker. Mother passed away from cancer at 95.   Stress test in 2018:Blood pressure demonstrated a hypertensive response to exercise.  There was no ST segment deviation noted during stress.  No ischemia. Very frequent monomorphic PVCs during peak stress and occasional PVCs in recovery. Normal exercise tolerance. Hypertensive response to exercise with max blood pressure 213/87 mmHg.  He had the flu in early 2019.  In late 2019, he had some chest discomfort while at the dentist. He underwent CT Angie of the coronary arteries. FFR in all 3 coronary distributions was essentially negative,with the exception of the very distal LAD. However, no stenosis was identified by angiography. There was suspicion of a PDA lesion of significance,but not mention to be significant by FFR. At that point, we told him to watch for symptoms. Any further symptoms would have [place] perform coronary angiography.  In 2020, it was noted that he has gained weight. He has not been exercising much. BP was high. He saw his PMD.   He had an MRI of the brain in 07/2019, which was unremarkable.   BP was better controlled after the lisinopril was increased.  In 2021, he has had some neck pain when turns his head a certain way.  He has had issues with grinding his teeth as well.  He had botox  to help with this in early in early 2021.  ENT eval was negative.  Omeprazole was added.  Since the last visit, he gained weight. 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  Has not seen the trainer in over a month due to travelling.    Received vaccines in early 2021.  Shoveled driveway recently without difficulty.     Past Medical History:  Diagnosis Date  . Asthma, mild    as child-only now if has URI  . BPH (benign prostatic hypertrophy)   . BPPV (benign paroxysmal positional vertigo)   . Diverticulosis   . Eczema   . Fatty liver disease, nonalcoholic   . Frequent nosebleeds    History of  . GERD (gastroesophageal reflux disease)    occ uses TUMS as needed  . Glaucoma   . History of bronchitis   . History of colon polyps   . History of diverticulitis   . History of kidney stones   . Hyperlipemia   . Hypertension   . Influenza A 11/2017  . Memory changes   . OA (osteoarthritis)   . Obesity   . Overactive bladder 2020  . PVCs (premature ventricular contractions)   . Renal calculi right    AND LEFT X1 NON-OBSTRUCTIVE  . Right sided sciatica    Bilateral buttock and thighs  . Simple renal cyst right interpoler, simpler in nature ,  asymptomatic  per urologist note (dr dahlstedt)  12-26-2011   BENIGN, has been drained  . Testosterone deficiency   . Wears glasses   . Wears hearing aid     Past Surgical History:  Procedure Laterality Date  . APPENDECTOMY    . bone spur removal     BILATERAL GREAT TOE  . COLONOSCOPY    . CYSTO/ RIGHT RETROGRADE PYELOGRAM/ RIGHT URETER DILATATION/ RIGHT URETEROSCOPY/ LASER LITHOTRIPSY RIGHT RENAL PELVIS AND LOWER POLE CALCULI/ PLACEMENT STENT  01-11-2012  DR DAHLSTEDT   RIGHT RENAL PELVIS AND LOWER POLE CALCULI  . CYSTOSCOPY W/ RETROGRADES  06/02/2012   Procedure: CYSTOSCOPY WITH RETROGRADE PYELOGRAM;  Surgeon: Franchot Gallo, MD;  Location: [facility];  Service: Urology;  Laterality:  Right;  . CYSTOSCOPY WITH URETEROSCOPY  06/02/2012   Procedure: CYSTOSCOPY WITH URETEROSCOPY;  Surgeon: Franchot Gallo, MD;  Location: [facility];  Service: Urology;  Laterality: Right;  90 MIN ALSO EXTRACTION OF CALCULI   . EYE SURGERY Bilateral 2020  . LITHOTRIPSY  APPROX.  1998   ESWL  left kidney x 2  . ROTATOR CUFF REPAIR  2012   LEFT SHOULDER  . SHOULDER ARTHROSCOPY WITH ROTATOR CUFF REPAIR AND SUBACROMIAL DECOMPRESSION  09/12/2012   Procedure: SHOULDER ARTHROSCOPY WITH ROTATOR CUFF REPAIR AND SUBACROMIAL DECOMPRESSION;  Surgeon: Nita Sells, MD;  Location: [facility];  Service: Orthopedics;  Laterality: Right;  see above and biceps tendonotomy  . TRANSURETHRAL RESECTION OF BLADDER NECK N/A 04/08/2018   Procedure: TRANSURETHRAL RESECTION OF BLADDER NECK CONTRACTURE;  Surgeon: Franchot Gallo, MD;  Location: [facility];  Service: Urology;  Laterality: N/A;  . TRANSURETHRAL RESECTION OF PROSTATE  11-29-2009   BPH  . VASECTOMY  1970'S   GEN. ANES.     Current Outpatient Medications  Medication Sig Dispense Refill  . albuterol (PROAIR HFA) 108 (90 Base) MCG/ACT inhaler Inhale 2 puffs into the lungs every 6 (six) hours as needed for wheezing or shortness of breath. 18 g 2  . amLODipine (NORVASC) 5 MG tablet Take 1 tablet (5 mg total) by mouth daily. 90 tablet 3  . aspirin 81 MG tablet Take 81 mg by mouth daily.    . B-D 3CC LUER-LOK SYR 18GX1-1/2 18G X 1-1/2" 3 ML MISC USE ONCE A WEEK AS DIRECTED  2  . fenofibrate micronized (LOFIBRA) 67 MG capsule Take 1 capsule (67 mg total) by mouth daily before breakfast. 90 capsule 3  . latanoprost (XALATAN) 0.005 % ophthalmic solution Place 1 drop into the right eye at bedtime. 2.5 mL 12  . lisinopril (ZESTRIL) 40 MG tablet Take 1 tablet (40 mg total) by mouth daily. 90 tablet 3  . omeprazole (PRILOSEC) 20 MG capsule Take 20 mg by mouth daily. Per patient OTC    . psyllium  (METAMUCIL) 58.6 % powder Take 1 packet by mouth daily.    . rosuvastatin (CRESTOR) 5 MG tablet Take 1 tablet (5 mg total) by mouth daily. 90 tablet 3  . testosterone cypionate (DEPOTESTOSTERONE CYPIONATE) 200 MG/ML injection Inject 0.5 mLs (100 mg total) into the muscle every 7 (seven) days. 10 mL 0  . triamcinolone (KENALOG) 0.025 % cream Apply 1 application topically as needed.     No current facility-administered medications for this visit.    Allergies:   Paroxetine, Morphine and related, Ciprofloxacin, and Flagyl [metronidazole hcl]    Social History:  The patient  reports that he quit smoking about 53 years ago. His smoking  use included cigarettes. He quit after 8.00 years of use. He has never used smokeless tobacco. He reports current alcohol use. He reports that he does not use drugs.   Family History:  The patient's family history includes Cancer in his father and mother; Heart disease in his father; Squamous cell carcinoma in his mother.    ROS:  Please see the history of present illness.   Otherwise, review of systems are positive for frequent urination.   All other systems are reviewed and negative.    PHYSICAL EXAM: VS:  BP (!) 152/80   Pulse 81   Ht 5\' 9"  (1.753 m)   Wt 223 lb 6.4 oz (101.3 kg)   SpO2 96%   BMI 32.99 kg/m  , BMI Body mass index is 32.99 kg/m. GEN: Well nourished, well developed, in no acute distress  HEENT: normal  Neck: no JVD, carotid bruits, or masses Cardiac: RRR; no murmurs, rubs, or gallops,no edema  Respiratory:  clear to auscultation bilaterally, normal work of breathing GI: soft, nontender, nondistended, + BS, obese MS: no deformity or atrophy  Skin: warm and dry, no rash Neuro:  Strength and sensation are intact Psych: euthymic mood, full affect   EKG:   The ekg ordered today demonstrates NSR, widened QRS   Recent Labs: 01/04/2020: TSH 0.86 06/10/2020: ALT 14; BUN 19; Creatinine, Ser 1.21; Potassium 4.5; Sodium 139   Lipid  Panel    Component Value Date/Time   CHOL 129 06/10/2020 1341   CHOL 126 12/12/2018 0810   TRIG 104.0 06/10/2020 1341   HDL 39.80 06/10/2020 1341   HDL 44 12/12/2018 0810   CHOLHDL 3 06/10/2020 1341   VLDL 20.8 06/10/2020 1341   LDLCALC 69 06/10/2020 1341   LDLCALC 67 12/12/2018 0810   LDLDIRECT 107.6 12/14/2011 0954     Other studies Reviewed: Additional studies/ records that were reviewed today with results demonstrating: labs reviewed.   ASSESSMENT AND PLAN:  1. CAD/ family h/o CAD: Moderate by CT scan.  No angina on medical therapy.   2. DM: noted in the past.  Resolved with weight loss.  3. OSA: Using CPAP.  4. HTN: Currently on lisinopril and amlodipine.  Increase amlodipine to 10 mg daily.  With going back to exercise, hopefully weight and BP will come down.  He can then reduce amlodipine to 5 mg daily. 5. Obesity: whole food , plant based diet.   Current medicines are reviewed at length with the patient today.  The patient concerns regarding his medicines were addressed.  The following changes have been made:  No change  Labs/ tests ordered today include:  No orders of the defined types were placed in this encounter.   Recommend 150 minutes/week of aerobic exercise Low fat, low carb, high fiber diet recommended  Disposition:   FU in 6 months   Signed, Larae Grooms, MD  09/12/2020 9:28 AM    [facility], [place], [place]  [address] Phone: [phone]; Fax: [phone]

## 2020-09-11 ENCOUNTER — Ambulatory Visit: Payer: Medicare HMO | Admitting: Interventional Cardiology

## 2020-09-12 ENCOUNTER — Other Ambulatory Visit: Payer: Self-pay

## 2020-09-12 ENCOUNTER — Ambulatory Visit: Payer: Medicare HMO | Admitting: Interventional Cardiology

## 2020-09-12 ENCOUNTER — Encounter: Payer: Self-pay | Admitting: Interventional Cardiology

## 2020-09-12 VITALS — BP 152/80 | HR 81 | Ht 69.0 in | Wt 223.4 lb

## 2020-09-12 DIAGNOSIS — I2510 Atherosclerotic heart disease of native coronary artery without angina pectoris: Secondary | ICD-10-CM | POA: Diagnosis not present

## 2020-09-12 DIAGNOSIS — Z6830 Body mass index (BMI) 30.0-30.9, adult: Secondary | ICD-10-CM

## 2020-09-12 DIAGNOSIS — Z8249 Family history of ischemic heart disease and other diseases of the circulatory system: Secondary | ICD-10-CM

## 2020-09-12 DIAGNOSIS — E782 Mixed hyperlipidemia: Secondary | ICD-10-CM

## 2020-09-12 DIAGNOSIS — I10 Essential (primary) hypertension: Secondary | ICD-10-CM | POA: Diagnosis not present

## 2020-09-12 DIAGNOSIS — E669 Obesity, unspecified: Secondary | ICD-10-CM | POA: Diagnosis not present

## 2020-09-12 DIAGNOSIS — I493 Ventricular premature depolarization: Secondary | ICD-10-CM

## 2020-09-12 MED ORDER — AMLODIPINE BESYLATE 10 MG PO TABS: 10.0000 mg | ORAL_TABLET | Freq: Every day | ORAL | 3 refills | Status: DC

## 2020-09-12 NOTE — Patient Instructions (Signed)
Medication Instructions:  Your physician has recommended you make the following change in your medication:   1.)STOP amlodipine 5mg   2.) START: amlodipine 10mg  Daily *If you need a refill on your cardiac medications before your next appointment, please call your pharmacy*   Lab Work: NONE Ordered   Testing/Procedures: NONE Ordered   Follow-Up: At [facility], you and your health needs are our priority.  As part of our continuing mission to provide you with exceptional heart care, we have created designated Provider Care Teams.  These Care Teams include your primary Cardiologist (physician) and Advanced Practice Providers (APPs -  Physician Assistants and Nurse Practitioners) who all work together to provide you with the care you need, when you need it.  We recommend signing up for the patient portal called "MyChart".  Sign up information is provided on this After Visit Summary.  MyChart is used to connect with patients for Virtual Visits (Telemedicine).  Patients are able to view lab/test results, encounter notes, upcoming appointments, etc.  Non-urgent messages can be sent to your provider as well.   To learn more about what you can do with MyChart, go to [email or web address].    Your next appointment:   6 month(s)  The format for your next appointment:   In Person  Provider:   You may see Larae Grooms, MD or one of the following Advanced Practice Providers on your designated Care Team:    Melina Copa, PA-C  Ermalinda Barrios, PA-C    Other Instructions  Start monitoring BP daily starting on 09/16/20 for 1 week and send results through Door or call in readings [phone]  High-Fiber Eating Plan Fiber, also called dietary fiber, is a type of carbohydrate. It is found foods such as fruits, vegetables, whole grains, and beans. A high-fiber diet can have many health benefits. Your health care provider may recommend a high-fiber diet to help:  Prevent  constipation. Fiber can make your bowel movements more regular.  Lower your cholesterol.  Relieve the following conditions: ? Inflammation of veins in the anus (hemorrhoids). ? Inflammation of specific areas of the digestive tract (uncomplicated diverticulosis). ? A problem of the large intestine, also called the colon, that sometimes causes pain and diarrhea (irritable bowel syndrome, or IBS).  Prevent overeating as part of a weight-loss plan.  Prevent heart disease, type 2 diabetes, and certain cancers. What are tips for following this plan? Reading food labels  Check the nutrition facts label on food products for the amount of dietary fiber. Choose foods that have 5 grams of fiber or more per serving.  The goals for recommended daily fiber intake include: ? Men (age 43 or younger): 34-38 g. ? Men (over age 69): 28-34 g. ? Women (age 83 or younger): 25-28 g. ? Women (over age 30): 22-25 g. Your daily fiber goal is _____________ g.   Shopping  Choose whole fruits and vegetables instead of processed forms, such as apple juice or applesauce.  Choose a wide variety of high-fiber foods such as avocados, lentils, oats, and kidney beans.  Read the nutrition facts label of the foods you choose. Be aware of foods with added fiber. These foods often have high sugar and sodium amounts per serving. Cooking  Use whole-grain flour for baking and cooking.  Cook with brown rice instead of white rice. Meal planning  Start the day with a breakfast that is high in fiber, such as a cereal that contains 5 g of fiber or more per serving.  Eat  breads and cereals that are made with whole-grain flour instead of refined flour or white flour.  Eat brown rice, bulgur wheat, or millet instead of white rice.  Use beans in place of meat in soups, salads, and pasta dishes.  Be sure that half of the grains you eat each day are whole grains. General information  You can get the recommended daily  intake of dietary fiber by: ? Eating a variety of fruits, vegetables, grains, nuts, and beans. ? Taking a fiber supplement if you are not able to take in enough fiber in your diet. It is better to get fiber through food than from a supplement.  Gradually increase how much fiber you consume. If you increase your intake of dietary fiber too quickly, you may have bloating, cramping, or gas.  Drink plenty of water to help you digest fiber.  Choose high-fiber snacks, such as berries, raw vegetables, nuts, and popcorn. What foods should I eat? Fruits Berries. Pears. Apples. Oranges. Avocado. Prunes and raisins. Dried figs. Vegetables Sweet potatoes. Spinach. Kale. Artichokes. Cabbage. Broccoli. Cauliflower. Green peas. Carrots. Squash. Grains Whole-grain breads. Multigrain cereal. Oats and oatmeal. Brown rice. Barley. Bulgur wheat. [place]. Quinoa. Bran muffins. Popcorn. Rye wafer crackers. Meats and other proteins Navy beans, kidney beans, and pinto beans. Soybeans. Split peas. Lentils. Nuts and seeds. Dairy Fiber-fortified yogurt. Beverages Fiber-fortified soy milk. Fiber-fortified orange juice. Other foods Fiber bars. The items listed above may not be a complete list of recommended foods and beverages. Contact a dietitian for more information. What foods should I avoid? Fruits Fruit juice. Cooked, strained fruit. Vegetables Fried potatoes. Canned vegetables. Well-cooked vegetables. Grains White bread. Pasta made with refined flour. White rice. Meats and other proteins Fatty cuts of meat. Fried chicken or fried fish. Dairy Milk. Yogurt. Cream cheese. Sour cream. Fats and oils Butters. Beverages Soft drinks. Other foods Cakes and pastries. The items listed above may not be a complete list of foods and beverages to avoid. Talk with your dietitian about what choices are best for you. Summary  Fiber is a type of carbohydrate. It is found in foods such as fruits, vegetables, whole  grains, and beans.  A high-fiber diet has many benefits. It can help to prevent constipation, lower blood cholesterol, aid weight loss, and reduce your risk of heart disease, diabetes, and certain cancers.  Increase your intake of fiber gradually. Increasing fiber too quickly may cause cramping, bloating, and gas. Drink plenty of water while you increase the amount of fiber you consume.  The best sources of fiber include whole fruits and vegetables, whole grains, nuts, seeds, and beans. This information is not intended to replace advice given to you by your health care provider. Make sure you discuss any questions you have with your health care provider. Document Revised: 12/14/2019 Document Reviewed: 12/14/2019 Elsevier Patient Education  2021 [employer].

## 2020-09-15 ENCOUNTER — Other Ambulatory Visit: Payer: Self-pay | Admitting: Interventional Cardiology

## 2020-10-09 ENCOUNTER — Other Ambulatory Visit: Payer: Self-pay

## 2020-10-09 DIAGNOSIS — I10 Essential (primary) hypertension: Secondary | ICD-10-CM

## 2020-10-09 DIAGNOSIS — Z79899 Other long term (current) drug therapy: Secondary | ICD-10-CM

## 2020-10-09 MED ORDER — LISINOPRIL 40 MG PO TABS: 40.0000 mg | ORAL_TABLET | Freq: Every day | ORAL | 3 refills | Status: DC

## 2020-10-14 DIAGNOSIS — M25562 Pain in left knee: Secondary | ICD-10-CM | POA: Diagnosis not present

## 2020-10-23 DIAGNOSIS — M25562 Pain in left knee: Secondary | ICD-10-CM | POA: Diagnosis not present

## 2020-10-28 DIAGNOSIS — M25562 Pain in left knee: Secondary | ICD-10-CM | POA: Diagnosis not present

## 2020-11-13 DIAGNOSIS — I10 Essential (primary) hypertension: Secondary | ICD-10-CM | POA: Diagnosis not present

## 2020-11-13 DIAGNOSIS — G4733 Obstructive sleep apnea (adult) (pediatric): Secondary | ICD-10-CM | POA: Diagnosis not present

## 2020-11-14 DIAGNOSIS — Z86018 Personal history of other benign neoplasm: Secondary | ICD-10-CM | POA: Diagnosis not present

## 2020-11-14 DIAGNOSIS — Z85828 Personal history of other malignant neoplasm of skin: Secondary | ICD-10-CM | POA: Diagnosis not present

## 2020-11-14 DIAGNOSIS — L570 Actinic keratosis: Secondary | ICD-10-CM | POA: Diagnosis not present

## 2020-11-14 DIAGNOSIS — L853 Xerosis cutis: Secondary | ICD-10-CM | POA: Diagnosis not present

## 2020-11-14 DIAGNOSIS — L578 Other skin changes due to chronic exposure to nonionizing radiation: Secondary | ICD-10-CM | POA: Diagnosis not present

## 2020-11-14 DIAGNOSIS — B351 Tinea unguium: Secondary | ICD-10-CM | POA: Diagnosis not present

## 2020-11-14 DIAGNOSIS — Z872 Personal history of diseases of the skin and subcutaneous tissue: Secondary | ICD-10-CM | POA: Diagnosis not present

## 2020-11-21 DIAGNOSIS — H6123 Impacted cerumen, bilateral: Secondary | ICD-10-CM | POA: Diagnosis not present

## 2020-11-21 DIAGNOSIS — H903 Sensorineural hearing loss, bilateral: Secondary | ICD-10-CM | POA: Diagnosis not present

## 2020-12-04 DIAGNOSIS — H40051 Ocular hypertension, right eye: Secondary | ICD-10-CM | POA: Diagnosis not present

## 2020-12-04 DIAGNOSIS — H26493 Other secondary cataract, bilateral: Secondary | ICD-10-CM | POA: Diagnosis not present

## 2020-12-18 DIAGNOSIS — M25562 Pain in left knee: Secondary | ICD-10-CM | POA: Diagnosis not present

## 2020-12-18 DIAGNOSIS — M722 Plantar fascial fibromatosis: Secondary | ICD-10-CM | POA: Diagnosis not present

## 2021-01-15 DIAGNOSIS — M6281 Muscle weakness (generalized): Secondary | ICD-10-CM | POA: Diagnosis not present

## 2021-01-15 DIAGNOSIS — M722 Plantar fascial fibromatosis: Secondary | ICD-10-CM | POA: Diagnosis not present

## 2021-01-22 DIAGNOSIS — M25562 Pain in left knee: Secondary | ICD-10-CM | POA: Diagnosis not present

## 2021-02-12 DIAGNOSIS — G4733 Obstructive sleep apnea (adult) (pediatric): Secondary | ICD-10-CM | POA: Diagnosis not present

## 2021-02-14 DIAGNOSIS — S83242A Other tear of medial meniscus, current injury, left knee, initial encounter: Secondary | ICD-10-CM | POA: Diagnosis not present

## 2021-02-20 ENCOUNTER — Telehealth: Payer: Self-pay | Admitting: *Deleted

## 2021-02-20 NOTE — Telephone Encounter (Signed)
   [facility] Pre-operative Risk Assessment    Patient Name: Jack Norris  DOB: 13-Jun-1941  MRN: [id number]   HEARTCARE STAFF: - Please ensure there is not already an duplicate clearance open for this procedure. - Under Visit Info/Reason for Call, type in Other and utilize the format Clearance MM/DD/YY or Clearance TBD. Do not use dashes or single digits. - If request is for dental extraction, please clarify the # of teeth to be extracted. - If the patient is currently at the dentist's office, call Pre-Op APP to address. If the patient is not currently in the dentist office, please route to the Pre-Op pool  Request for surgical clearance:  What type of surgery is being performed? LEFT KNEE SCOPE   When is this surgery scheduled? TBD   What type of clearance is required (medical clearance vs. Pharmacy clearance to hold med vs. Both)? MEDICAL  Are there any medications that need to be held prior to surgery and how long? ASA    Practice name and name of physician performing surgery? [facility]; DR. Marchia Bond   What is the office phone number? [phone]   7.   What is the office fax number? [place].   Anesthesia type (None, local, MAC, general) ? CHOICE   Julaine Hua 02/20/2021, 5:24 PM  _________________________________________________________________   (provider comments below)

## 2021-02-21 NOTE — Telephone Encounter (Signed)
Spoke with patient and made him aware that his cardiac clearance will be discussed at this upcoming follow up with Dr Irish Lack on 03/13/21. Patient voiced understanding but stated he is having a simple procedure and doesn't think he should need clearance. I advised patient that Dr Luanna Cole office sent over clearance and because he has not been seen recently we will discuss clearance at his next appointment.  He voiced understanding.

## 2021-02-21 NOTE — Telephone Encounter (Signed)
Aubrey, MD  Chart reviewed as part of pre-operative protocol coverage. Because of Jack Norris past medical history and time since last visit, he/she will require a follow-up visit in order to better assess preoperative cardiovascular risk.  Pre-op covering staff: - Please schedule appointment and call patient to inform them. - Please contact requesting surgeon's office via preferred method (i.e, phone, fax) to inform them of need for appointment prior to surgery.  If applicable, this message will also be routed to pharmacy pool and/or primary cardiologist for input on holding anticoagulant/antiplatelet agent as requested below so that this information is available at time of patient's appointment.   Deberah Pelton, NP  02/21/2021, 8:30 AM

## 2021-02-27 ENCOUNTER — Other Ambulatory Visit: Payer: Self-pay

## 2021-02-27 ENCOUNTER — Ambulatory Visit (INDEPENDENT_AMBULATORY_CARE_PROVIDER_SITE_OTHER): Payer: Medicare HMO | Admitting: Family Medicine

## 2021-02-27 ENCOUNTER — Encounter: Payer: Self-pay | Admitting: Family Medicine

## 2021-02-27 VITALS — BP 136/74 | HR 86 | Temp 98.4°F | Ht 69.0 in | Wt 220.0 lb

## 2021-02-27 DIAGNOSIS — I10 Essential (primary) hypertension: Secondary | ICD-10-CM

## 2021-02-27 DIAGNOSIS — I2510 Atherosclerotic heart disease of native coronary artery without angina pectoris: Secondary | ICD-10-CM

## 2021-02-27 DIAGNOSIS — Z01818 Encounter for other preprocedural examination: Secondary | ICD-10-CM | POA: Insufficient documentation

## 2021-02-27 NOTE — Progress Notes (Signed)
Patient ID: Jack Norris, male    DOB: 03-Jul-1941, 80 y.o.   MRN: [id number]  This visit was conducted in person.  Pulse 86   Temp 98.4 F (36.9 C) (Temporal)   Ht 5\' 9"  (1.753 m)   Wt 220 lb (99.8 kg)   SpO2 97%   BMI 32.49 kg/m    CC: Chief Complaint  Patient presents with   surgical clearance    Subjective:   HPI: Jack Norris is a 80 y.o. male with history of mild memory loss, high cholesterol , fatty liver, testosterone deficiency, CAD,GERD, HTN presenting on 02/27/2021 for surgical clearance   He has upcoming left knee scope per Dr. Mardelle Matte  for meniscal tear.  Most likely nerve block at [facility] surgical.   He is currently on Aspirin 81 mg daily   He reports no current SOB, CP with exertion.   BP is well controlled on lisinopril 40 mg, amlodipine 10 mg daily BP Readings from Last 3 Encounters:  02/27/21 136/74  09/12/20 (!) 152/80  08/02/20 (!) 142/80   ON CPAP for sleep apnea.   He has been doing 20 min on rowing machine, working with a Clinical research associate. Able to lift 25 lbs in hand , curls etc.  Able to walk up  several flight of stairs with expected minimal SOB. Followed by Dr. Irish Lack for CAD.  Has appt with cardiology 03/13/2021.      Relevant past medical, surgical, family and social history reviewed and updated as indicated. Interim medical history since our last visit reviewed. Allergies and medications reviewed and updated. Outpatient Medications Prior to Visit  Medication Sig Dispense Refill   albuterol (PROAIR HFA) 108 (90 Base) MCG/ACT inhaler Inhale 2 puffs into the lungs every 6 (six) hours as needed for wheezing or shortness of breath. 18 g 2   aspirin 81 MG tablet Take 81 mg by mouth daily.     B-D 3CC LUER-LOK SYR 18GX1-1/2 18G X 1-1/2" 3 ML MISC USE ONCE A WEEK AS DIRECTED  2   fenofibrate micronized (LOFIBRA) 67 MG capsule TAKE 1 CAPSULE(67 MG) BY MOUTH DAILY BEFORE BREAKFAST 90 capsule 3   latanoprost (XALATAN) 0.005 % ophthalmic  solution Place 1 drop into the right eye at bedtime. 2.5 mL 12   lisinopril (ZESTRIL) 40 MG tablet Take 1 tablet (40 mg total) by mouth daily. 90 tablet 3   omeprazole (PRILOSEC) 20 MG capsule Take 20 mg by mouth daily. Per patient OTC     psyllium (METAMUCIL) 58.6 % powder Take 1 packet by mouth daily.     rosuvastatin (CRESTOR) 5 MG tablet Take 1 tablet (5 mg total) by mouth daily. 90 tablet 3   testosterone cypionate (DEPOTESTOSTERONE CYPIONATE) 200 MG/ML injection Inject 0.5 mLs (100 mg total) into the muscle every 7 (seven) days. 10 mL 0   triamcinolone (KENALOG) 0.025 % cream Apply 1 application topically as needed.     amLODipine (NORVASC) 10 MG tablet Take 1 tablet (10 mg total) by mouth daily. 180 tablet 3   No facility-administered medications prior to visit.     Per HPI unless specifically indicated in ROS section below Review of Systems  Constitutional:  Negative for fatigue and fever.  HENT:  Negative for ear pain.   Eyes:  Negative for pain.  Respiratory:  Negative for cough and shortness of breath.   Cardiovascular:  Negative for chest pain, palpitations and leg swelling.  Gastrointestinal:  Negative for abdominal pain.  Genitourinary:  Negative  for dysuria.  Musculoskeletal:  Negative for arthralgias.  Neurological:  Negative for syncope, light-headedness and headaches.  Psychiatric/Behavioral:  Negative for dysphoric mood.   Objective:  Pulse 86   Temp 98.4 F (36.9 C) (Temporal)   Ht 5\' 9"  (1.753 m)   Wt 220 lb (99.8 kg)   SpO2 97%   BMI 32.49 kg/m   Wt Readings from Last 3 Encounters:  02/27/21 220 lb (99.8 kg)  09/12/20 223 lb 6.4 oz (101.3 kg)  08/02/20 222 lb (100.7 kg)      Physical Exam Constitutional:      Appearance: He is well-developed. He is obese.  HENT:     Head: Normocephalic.     Right Ear: Hearing normal.     Left Ear: Hearing normal.     Nose: Nose normal.  Neck:     Thyroid: No thyroid mass or thyromegaly.     Vascular: No carotid  bruit.     Trachea: Trachea normal.  Cardiovascular:     Rate and Rhythm: Normal rate and regular rhythm.     Pulses: Normal pulses.     Heart sounds: Heart sounds not distant. No murmur heard.   No friction rub. No gallop.     Comments: No peripheral edema Pulmonary:     Effort: Pulmonary effort is normal. No respiratory distress.     Breath sounds: Normal breath sounds.  Skin:    General: Skin is warm and dry.     Findings: No rash.  Psychiatric:        Speech: Speech normal.        Behavior: Behavior normal.        Thought Content: Thought content normal.      Results for orders placed or performed in visit on 06/10/20  Lipid panel  Result Value Ref Range   Cholesterol 129 0 - 200 mg/dL   Triglycerides 104.0 0.0 - 149.0 mg/dL   HDL 39.80 >39.00 mg/dL   VLDL 20.8 0.0 - 40.0 mg/dL   LDL Cholesterol 69 0 - 99 mg/dL   Total CHOL/HDL Ratio 3    NonHDL 89.32   Comprehensive metabolic panel  Result Value Ref Range   Sodium 139 135 - 145 mEq/L   Potassium 4.5 3.5 - 5.1 mEq/L   Chloride 104 96 - 112 mEq/L   CO2 28 19 - 32 mEq/L   Glucose, Bld 93 70 - 99 mg/dL   BUN 19 6 - 23 mg/dL   Creatinine, Ser 1.21 0.40 - 1.50 mg/dL   Total Bilirubin 0.7 0.2 - 1.2 mg/dL   Alkaline Phosphatase 82 39 - 117 U/L   AST 15 0 - 37 U/L   ALT 14 0 - 53 U/L   Total Protein 6.6 6.0 - 8.3 g/dL   Albumin 4.3 3.5 - 5.2 g/dL   GFR 56.51 (L) >60.00 mL/min   Calcium 9.4 8.4 - 10.5 mg/dL    This visit occurred during the SARS-CoV-2 public health emergency.  Safety protocols were in place, including screening questions prior to the visit, additional usage of staff PPE, and extensive cleaning of exam room while observing appropriate contact time as indicated for disinfecting solutions.   COVID 19 screen:  No recent travel or known exposure to COVID19 The patient denies respiratory symptoms of COVID 19 at this time. The importance of social distancing was discussed today.   Assessment and Plan      Eliezer Lofts, MD

## 2021-02-27 NOTE — Assessment & Plan Note (Signed)
ON ASA 81 mg daily.. has preop visit with D.r Irish Lack

## 2021-02-27 NOTE — Patient Instructions (Signed)
Hold aspirin  7 days prior to surgery

## 2021-02-27 NOTE — Assessment & Plan Note (Signed)
Pt with optimized blood pressure.  No respiratory issues. Open airway. Low cardiac and respiratory risk for complications. Excellent cardiac endurance for age and able to exercise  To around  6-8 mets.  Hold aspirin 1 week prior to surgery.  Low risk surgery.. possible local block for anesthesia.

## 2021-02-27 NOTE — Assessment & Plan Note (Signed)
Stable, chronic.  Continue current medication.    Lisinopril 40 mg daily  amlodipine 10 mg daily.

## 2021-03-03 DIAGNOSIS — N401 Enlarged prostate with lower urinary tract symptoms: Secondary | ICD-10-CM | POA: Diagnosis not present

## 2021-03-04 DIAGNOSIS — H40051 Ocular hypertension, right eye: Secondary | ICD-10-CM | POA: Diagnosis not present

## 2021-03-10 DIAGNOSIS — N401 Enlarged prostate with lower urinary tract symptoms: Secondary | ICD-10-CM | POA: Diagnosis not present

## 2021-03-10 DIAGNOSIS — R350 Frequency of micturition: Secondary | ICD-10-CM | POA: Diagnosis not present

## 2021-03-10 DIAGNOSIS — E291 Testicular hypofunction: Secondary | ICD-10-CM | POA: Diagnosis not present

## 2021-03-12 NOTE — Progress Notes (Signed)
Cardiology Office Note   Date:  03/13/2021   ID:  Jack Norris, DOB 27-Aug-1940, MRN [id number]  PCP:  Jinny Sanders, MD    No chief complaint on file.  CAD  Wt Readings from Last 3 Encounters:  03/13/21 219 lb 9.6 oz (99.6 kg)  02/27/21 220 lb (99.8 kg)  09/12/20 223 lb 6.4 oz (101.3 kg)       History of Present Illness: Jack Norris is a 80 y.o. male  With hyperlipidemia.  He has been on lopid for years.     He has had palpitations in the past.  No management with a cardiologist at that time.  Sx have resolved.   He has lost weight in the past.   His father had CAD, CABG x 4, chronic smoker.  Mother passed away from cancer at 95.     Stress test in 2018: Blood pressure demonstrated a hypertensive response to exercise. There was no ST segment deviation noted during stress.   No ischemia. Very frequent monomorphic PVCs during peak stress and occasional PVCs in recovery. Normal exercise tolerance. Hypertensive response to exercise with max blood pressure 213/87 mmHg.   He had the flu in early 2019.    In late 2019, he had some chest discomfort while at the dentist.   He underwent CT Angio of the coronary arteries.  FFR in all 3 coronary distributions was essentially negative, with the exception of the very distal LAD.  However, no stenosis was identified by angiography.  There was suspicion of a PDA lesion of significance, but not mention to be significant by FFR.  At that point, we told him to watch for symptoms.  Any further symptoms would have [place] perform coronary angiography.   In 2020, it was noted that he has gained weight.  He has not been exercising much.  BP was high.  He saw his PMD.     He had an MRI of the brain in 07/2019, which was unremarkable.   BP was better controlled after the lisinopril was increased.   In 2021, he has had some neck pain when turns his head a certain way.  He has had issues with grinding his teeth as well.  He had botox  to help with this in early in early 2021.  ENT eval was negative.  Omeprazole was added.  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  Has not seen the trainer in over a month due to travelling.     Received vaccines in early 2021.  Past Medical History:  Diagnosis Date   Asthma, mild    as child-only now if has URI   BPH (benign prostatic hypertrophy)    BPPV (benign paroxysmal positional vertigo)    Diverticulosis    Eczema    Fatty liver disease, nonalcoholic    Frequent nosebleeds    History of   GERD (gastroesophageal reflux disease)    occ uses TUMS as needed   Glaucoma    History of bronchitis    History of colon polyps    History of diverticulitis    History of kidney stones    Hyperlipemia    Hypertension    Influenza A 11/2017   Memory changes    OA (osteoarthritis)    Obesity    Overactive bladder 2020   PVCs (premature ventricular contractions)    Renal calculi right    AND LEFT X1 NON-OBSTRUCTIVE   Right sided  sciatica    Bilateral buttock and thighs   Simple renal cyst right interpoler, simpler in nature , asymptomatic  per urologist note (dr dahlstedt)  12-26-2011   BENIGN, has been drained   Testosterone deficiency    Wears glasses    Wears hearing aid     Past Surgical History:  Procedure Laterality Date   APPENDECTOMY     bone spur removal     BILATERAL GREAT TOE   COLONOSCOPY     CYSTO/ RIGHT RETROGRADE PYELOGRAM/ RIGHT URETER DILATATION/ RIGHT URETEROSCOPY/ LASER LITHOTRIPSY RIGHT RENAL PELVIS AND LOWER POLE CALCULI/ PLACEMENT STENT  01-11-2012  DR DAHLSTEDT   RIGHT RENAL PELVIS AND LOWER POLE CALCULI   CYSTOSCOPY W/ RETROGRADES  06/02/2012   Procedure: CYSTOSCOPY WITH RETROGRADE PYELOGRAM;  Surgeon: Franchot Gallo, MD;  Location: [facility];  Service: Urology;  Laterality: Right;   CYSTOSCOPY WITH URETEROSCOPY  06/02/2012   Procedure: CYSTOSCOPY WITH URETEROSCOPY;  Surgeon: Franchot Gallo, MD;   Location: [facility];  Service: Urology;  Laterality: Right;  90 MIN ALSO EXTRACTION OF CALCULI    EYE SURGERY Bilateral 2020   LITHOTRIPSY  APPROX.  1998   ESWL  left kidney x 2   ROTATOR CUFF REPAIR  2012   LEFT SHOULDER   SHOULDER ARTHROSCOPY WITH ROTATOR CUFF REPAIR AND SUBACROMIAL DECOMPRESSION  09/12/2012   Procedure: SHOULDER ARTHROSCOPY WITH ROTATOR CUFF REPAIR AND SUBACROMIAL DECOMPRESSION;  Surgeon: Nita Sells, MD;  Location: [place];  Service: Orthopedics;  Laterality: Right;  see above and biceps tendonotomy   TRANSURETHRAL RESECTION OF BLADDER NECK N/A 04/08/2018   Procedure: TRANSURETHRAL RESECTION OF BLADDER NECK CONTRACTURE;  Surgeon: Franchot Gallo, MD;  Location: [facility];  Service: Urology;  Laterality: N/A;   TRANSURETHRAL RESECTION OF PROSTATE  11-29-2009   BPH   VASECTOMY  1970'S   GEN. ANES.     Current Outpatient Medications  Medication Sig Dispense Refill   albuterol (PROAIR HFA) 108 (90 Base) MCG/ACT inhaler Inhale 2 puffs into the lungs every 6 (six) hours as needed for wheezing or shortness of breath. 18 g 2   amLODipine (NORVASC) 10 MG tablet Take 1 tablet (10 mg total) by mouth daily. 180 tablet 3   aspirin 81 MG tablet Take 81 mg by mouth daily.     B-D 3CC LUER-LOK SYR 18GX1-1/2 18G X 1-1/2" 3 ML MISC USE ONCE A WEEK AS DIRECTED  2   fenofibrate micronized (LOFIBRA) 67 MG capsule TAKE 1 CAPSULE(67 MG) BY MOUTH DAILY BEFORE BREAKFAST 90 capsule 3   lisinopril (ZESTRIL) 40 MG tablet Take 1 tablet (40 mg total) by mouth daily. 90 tablet 3   omeprazole (PRILOSEC) 20 MG capsule Take 20 mg by mouth daily. Per patient OTC     psyllium (METAMUCIL) 58.6 % powder Take 1 packet by mouth daily.     rosuvastatin (CRESTOR) 5 MG tablet Take 1 tablet (5 mg total) by mouth daily. 90 tablet 3   testosterone cypionate (DEPOTESTOSTERONE CYPIONATE) 200 MG/ML injection Inject 0.5 mLs (100 mg total) into the  muscle every 7 (seven) days. 10 mL 0   triamcinolone (KENALOG) 0.025 % cream Apply 1 application topically as needed.     No current facility-administered medications for this visit.    Allergies:   Paroxetine, Morphine and related, Ciprofloxacin, and Flagyl [metronidazole hcl]    Social History:  The patient  reports that he quit smoking about 54 years ago. His smoking use included cigarettes.  He has never used smokeless tobacco. He reports current alcohol use. He reports that he does not use drugs.   Family History:  The patient's family history includes Cancer in his father and mother; Heart disease in his father; Squamous cell carcinoma in his mother.    ROS:  Please see the history of present illness.   Otherwise, review of systems are positive for knee pain.   All other systems are reviewed and negative.    PHYSICAL EXAM: VS:  BP 108/60   Pulse 78   Ht 5\' 9"  (1.753 m)   Wt 219 lb 9.6 oz (99.6 kg)   SpO2 96%   BMI 32.43 kg/m  , BMI Body mass index is 32.43 kg/m. GEN: Well nourished, well developed, in no acute distress HEENT: normal Neck: no JVD, carotid bruits, or masses Cardiac: RRR; no murmurs, rubs, or gallops,; left knee swelling Respiratory:  clear to auscultation bilaterally, normal work of breathing GI: soft, nontender, nondistended, + BS, mild obesity MS: no deformity or atrophy Skin: warm and dry, no rash Neuro:  Strength and sensation are intact Psych: euthymic mood, full affect   EKG:   The ekg ordered today demonstrates NSR, upper normal QRS duration, no ST changes; no change compared to Jan 2022   Recent Labs: 06/10/2020: ALT 14; BUN 19; Creatinine, Ser 1.21; Potassium 4.5; Sodium 139   Lipid Panel    Component Value Date/Time   CHOL 129 06/10/2020 1341   CHOL 126 12/12/2018 0810   TRIG 104.0 06/10/2020 1341   HDL 39.80 06/10/2020 1341   HDL 44 12/12/2018 0810   CHOLHDL 3 06/10/2020 1341   VLDL 20.8 06/10/2020 1341   LDLCALC 69 06/10/2020 1341    LDLCALC 67 12/12/2018 0810   LDLDIRECT 107.6 12/14/2011 0954     Other studies Reviewed: Additional studies/ records that were reviewed today with results demonstrating: LDL 69 in 10/21.   ASSESSMENT AND PLAN:  CAD/ family h/o CAD: No angina.  Continue aggressive secondary prevention.   DM: Resolved with weight loss.  Last sugar was 93.  OSA: Using CPAP. Tolerated well.  HTN: The current medical regimen is effective;  continue present plan and medications. Obesity: Now on weight watchers.  Knee pain: needs knee surgery.  No further cardiac testing needed before surgery.  Had been working with a Clinical research associate. Able to walk up stairs in the house.  OK to hold aspirin 7 days prior to surgery.    Current medicines are reviewed at length with the patient today.  The patient concerns regarding his medicines were addressed.  The following changes have been made:  No change  Labs/ tests ordered today include:   Orders Placed This Encounter  Procedures   EKG 12-Lead    Recommend 150 minutes/week of aerobic exercise Low fat, low carb, high fiber diet recommended  Disposition:   FU in 1 year   Signed, Larae Grooms, MD  03/13/2021 12:06 PM    [facility] [address], [place], [place]  [address] Phone: [phone]; Fax: [phone]

## 2021-03-13 ENCOUNTER — Other Ambulatory Visit: Payer: Self-pay

## 2021-03-13 ENCOUNTER — Encounter: Payer: Self-pay | Admitting: Interventional Cardiology

## 2021-03-13 ENCOUNTER — Ambulatory Visit: Payer: Medicare HMO | Admitting: Interventional Cardiology

## 2021-03-13 VITALS — BP 108/60 | HR 78 | Ht 69.0 in | Wt 219.6 lb

## 2021-03-13 DIAGNOSIS — E782 Mixed hyperlipidemia: Secondary | ICD-10-CM

## 2021-03-13 DIAGNOSIS — Z8249 Family history of ischemic heart disease and other diseases of the circulatory system: Secondary | ICD-10-CM

## 2021-03-13 DIAGNOSIS — I2510 Atherosclerotic heart disease of native coronary artery without angina pectoris: Secondary | ICD-10-CM | POA: Diagnosis not present

## 2021-03-13 DIAGNOSIS — I493 Ventricular premature depolarization: Secondary | ICD-10-CM

## 2021-03-13 DIAGNOSIS — I10 Essential (primary) hypertension: Secondary | ICD-10-CM

## 2021-03-13 NOTE — Patient Instructions (Signed)

## 2021-03-24 ENCOUNTER — Encounter (INDEPENDENT_AMBULATORY_CARE_PROVIDER_SITE_OTHER): Payer: Medicare HMO | Admitting: Ophthalmology

## 2021-03-27 DIAGNOSIS — M948X6 Other specified disorders of cartilage, lower leg: Secondary | ICD-10-CM | POA: Diagnosis not present

## 2021-03-27 DIAGNOSIS — S83232A Complex tear of medial meniscus, current injury, left knee, initial encounter: Secondary | ICD-10-CM | POA: Diagnosis not present

## 2021-03-27 DIAGNOSIS — M1712 Unilateral primary osteoarthritis, left knee: Secondary | ICD-10-CM | POA: Diagnosis not present

## 2021-03-27 DIAGNOSIS — S83242A Other tear of medial meniscus, current injury, left knee, initial encounter: Secondary | ICD-10-CM | POA: Diagnosis not present

## 2021-04-29 ENCOUNTER — Encounter (INDEPENDENT_AMBULATORY_CARE_PROVIDER_SITE_OTHER): Payer: Self-pay | Admitting: Ophthalmology

## 2021-04-29 ENCOUNTER — Ambulatory Visit (INDEPENDENT_AMBULATORY_CARE_PROVIDER_SITE_OTHER): Payer: Medicare HMO | Admitting: Ophthalmology

## 2021-04-29 ENCOUNTER — Other Ambulatory Visit: Payer: Self-pay

## 2021-04-29 DIAGNOSIS — H35373 Puckering of macula, bilateral: Secondary | ICD-10-CM

## 2021-04-29 DIAGNOSIS — H35371 Puckering of macula, right eye: Secondary | ICD-10-CM

## 2021-04-29 DIAGNOSIS — G4733 Obstructive sleep apnea (adult) (pediatric): Secondary | ICD-10-CM | POA: Diagnosis not present

## 2021-04-29 DIAGNOSIS — Z9989 Dependence on other enabling machines and devices: Secondary | ICD-10-CM | POA: Diagnosis not present

## 2021-04-29 DIAGNOSIS — H35372 Puckering of macula, left eye: Secondary | ICD-10-CM | POA: Diagnosis not present

## 2021-04-29 NOTE — Assessment & Plan Note (Signed)
None distorting Thickening from epiretinal membrane OD without change over the last 1 year follow-up next 2 years

## 2021-04-29 NOTE — Progress Notes (Signed)
04/29/2021     CHIEF COMPLAINT Patient presents for  Chief Complaint  Patient presents with   Retina Evaluation      HISTORY OF PRESENT ILLNESS: Jack Norris is a 80 y.o. male who presents to the clinic today for:   HPI     Retina Evaluation   In both eyes.        Comments   With a history of epiretinal membrane OU.  Status post vitrectomy membrane peel left eye.      Last edited by Hurman Horn, MD on 04/29/2021 10:36 AM.      Referring physician: Jinny Sanders, MD [place],  [place] [address]  HISTORICAL INFORMATION:   Selected notes from the [facility]: No current outpatient medications on file. (Ophthalmic Drugs)   No current facility-administered medications for this visit. (Ophthalmic Drugs)   Current Outpatient Medications (Other)  Medication Sig   albuterol (PROAIR HFA) 108 (90 Base) MCG/ACT inhaler Inhale 2 puffs into the lungs every 6 (six) hours as needed for wheezing or shortness of breath.   amLODipine (NORVASC) 10 MG tablet Take 1 tablet (10 mg total) by mouth daily.   aspirin 81 MG tablet Take 81 mg by mouth daily.   B-D 3CC LUER-LOK SYR 18GX1-1/2 18G X 1-1/2" 3 ML MISC USE ONCE A WEEK AS DIRECTED   fenofibrate micronized (LOFIBRA) 67 MG capsule TAKE 1 CAPSULE(67 MG) BY MOUTH DAILY BEFORE BREAKFAST   lisinopril (ZESTRIL) 40 MG tablet Take 1 tablet (40 mg total) by mouth daily.   omeprazole (PRILOSEC) 20 MG capsule Take 20 mg by mouth daily. Per patient OTC   psyllium (METAMUCIL) 58.6 % powder Take 1 packet by mouth daily.   rosuvastatin (CRESTOR) 5 MG tablet Take 1 tablet (5 mg total) by mouth daily.   testosterone cypionate (DEPOTESTOSTERONE CYPIONATE) 200 MG/ML injection Inject 0.5 mLs (100 mg total) into the muscle every 7 (seven) days.   triamcinolone (KENALOG) 0.025 % cream Apply 1 application topically as needed.   No current facility-administered medications for this visit.  (Other)      REVIEW OF SYSTEMS:    ALLERGIES Allergies  Allergen Reactions   Paroxetine     REACTION: Had mild personality change on this medication and doesn't want to take this again.   Morphine And Related     Causes bladder problems   Ciprofloxacin Other (See Comments)    ABDOMINAL PAIN/ sob   Flagyl [Metronidazole Hcl] Other (See Comments)    ABDOMINAL PAIN/ sob    PAST MEDICAL HISTORY Past Medical History:  Diagnosis Date   Asthma, mild    as child-only now if has URI   BPH (benign prostatic hypertrophy)    BPPV (benign paroxysmal positional vertigo)    Diverticulosis    Eczema    Fatty liver disease, nonalcoholic    Frequent nosebleeds    History of   GERD (gastroesophageal reflux disease)    occ uses TUMS as needed   Glaucoma    History of bronchitis    History of colon polyps    History of diverticulitis    History of kidney stones    Hyperlipemia    Hypertension    Influenza A 11/2017   Memory changes    OA (osteoarthritis)    Obesity    Overactive bladder 2020   PVCs (premature ventricular contractions)    Renal calculi right  AND LEFT X1 NON-OBSTRUCTIVE   Right sided sciatica    Bilateral buttock and thighs   Simple renal cyst right interpoler, simpler in nature , asymptomatic  per urologist note (dr dahlstedt)  12-26-2011   BENIGN, has been drained   Testosterone deficiency    Wears glasses    Wears hearing aid    Past Surgical History:  Procedure Laterality Date   APPENDECTOMY     bone spur removal     BILATERAL GREAT TOE   COLONOSCOPY     CYSTO/ RIGHT RETROGRADE PYELOGRAM/ RIGHT URETER DILATATION/ RIGHT URETEROSCOPY/ LASER LITHOTRIPSY RIGHT RENAL PELVIS AND LOWER POLE CALCULI/ PLACEMENT STENT  01-11-2012  DR DAHLSTEDT   RIGHT RENAL PELVIS AND LOWER POLE CALCULI   CYSTOSCOPY W/ RETROGRADES  06/02/2012   Procedure: CYSTOSCOPY WITH RETROGRADE PYELOGRAM;  Surgeon: Franchot Gallo, MD;  Location[facility];  Service:  Urology;  Laterality: Right;   CYSTOSCOPY WITH URETEROSCOPY  06/02/2012   Procedure: CYSTOSCOPY WITH URETEROSCOPY;  Surgeon: Franchot Gallo, MD;  Location: [facility];  Service: Urology;  Laterality: Right;  90 MIN ALSO EXTRACTION OF CALCULI    EYE SURGERY Bilateral 2020   LITHOTRIPSY  APPROX.  1998   ESWL  left kidney x 2   ROTATOR CUFF REPAIR  2012   LEFT SHOULDER   SHOULDER ARTHROSCOPY WITH ROTATOR CUFF REPAIR AND SUBACROMIAL DECOMPRESSION  09/12/2012   Procedure: SHOULDER ARTHROSCOPY WITH ROTATOR CUFF REPAIR AND SUBACROMIAL DECOMPRESSION;  Surgeon: Nita Sells, MD;  Location: [facility];  Service: Orthopedics;  Laterality: Right;  see above and biceps tendonotomy   TRANSURETHRAL RESECTION OF BLADDER NECK N/A 04/08/2018   Procedure: TRANSURETHRAL RESECTION OF BLADDER NECK CONTRACTURE;  Surgeon: Franchot Gallo, MD;  [facility];  Service: Urology;  Laterality: N/A;   TRANSURETHRAL RESECTION OF PROSTATE  11-29-2009   BPH   VASECTOMY  1970'S   GEN. ANES.    FAMILY HISTORY Family History  Problem Relation Age of Onset   Cancer Mother        breast and kidney    Squamous cell carcinoma Mother    Cancer Father        lung   Heart disease Father     SOCIAL HISTORY Social History   Tobacco Use   Smoking status: Former    Years: 8.00    Types: Cigarettes    Quit date: 01/05/1967    Years since quitting: 54.3   Smokeless tobacco: Never   Tobacco comments:    QUIT AGE 72  Vaping Use   Vaping Use: Never used  Substance Use Topics   Alcohol use: Yes    Alcohol/week: 0.0 standard drinks    Comment: OCCASIONAL   Drug use: No         OPHTHALMIC EXAM:  Base Eye Exam     Visual Acuity (ETDRS)       Right Left   Dist cc 20/15 -2 20/20    Correction: Glasses         Tonometry (Tonopen, 10:35 AM)       Right Left   Pressure 12 15         Pupils       Pupils APD   Right PERRL None    Left PERRL None         Neuro/Psych     Oriented x3: Yes   Mood/Affect: Normal         Dilation  Both eyes: 1.0% Mydriacyl, 2.5% Phenylephrine @ 10:35 AM           Slit Lamp and Fundus Exam     External Exam       Right Left   External Normal Normal         Slit Lamp Exam       Right Left   Lids/Lashes Normal Normal   Conjunctiva/Sclera White and quiet White and quiet   Cornea Clear Clear   Anterior Chamber Deep and quiet Deep and quiet   Iris Round and reactive Round and reactive   Lens Posterior chamber intraocular lens Posterior chamber intraocular lens   Anterior Vitreous Normal Normal         Fundus Exam       Right Left   Posterior Vitreous Posterior vitreous detachment Clear, avitric   Disc Normal Normal   C/D Ratio 0.55 0.55   Macula Epiretinal membrane, no topo distortion, no topo distortion   Vessels Normal Normal   Periphery Normal Normal            IMAGING AND PROCEDURES  Imaging and Procedures for 04/29/21  OCT, Retina - OU - Both Eyes       Right Eye Quality was good. Scan locations included subfoveal. Central Foveal Thickness: 317. Progression has been stable. Findings include normal foveal contour.   Left Eye Quality was good. Scan locations included subfoveal. Central Foveal Thickness: 422. Progression has improved. Findings include abnormal foveal contour.   Notes OS, continued slow improvement of the central foveal thickness, now over 2 year post vitrectomy and internal limiting membrane peel.  Patient has been on CPAP now for some 28 months and this contributes to the improved macular findings my opinion             ASSESSMENT/PLAN:  Left epiretinal membrane History of vitrectomy membrane peel left eye May 2020  Right epiretinal membrane None distorting Thickening from epiretinal membrane OD without change over the last 1 year follow-up next 2 years  OSA on CPAP Patient continues with excellent  compliance     ICD-10-CM   1. Left epiretinal membrane  H35.372 OCT, Retina - OU - Both Eyes    2. Right epiretinal membrane  H35.371 OCT, Retina - OU - Both Eyes    3. OSA on CPAP  G47.33    Z99.89       1.  OS looks great slow subtle improvement post vitrectomy membrane peel for severe epiretinal membrane some 2 years previous  2.  Incidentally patient on OSA with treatment with CPAP now for some 2 years with excellent compliance.  3.  Distorting fovea epiretinal membrane OD, observe  Ophthalmic Meds Ordered this visit:  No orders of the defined types were placed in this encounter.      Return in about 2 years (around 04/30/2023) for DILATE OU, OCT.  There are no Patient Instructions on file for this visit.   Explained the diagnoses, plan, and follow up with the patient and they expressed understanding.  Patient expressed understanding of the importance of proper follow up care.   Clent Demark Marcelo Ickes M.D. Diseases & Surgery of the Retina and Vitreous [facility]ina [facility] 04/29/21     Abbreviations: M myopia (nearsighted); A astigmatism; H hyperopia (farsighted); P presbyopia; Mrx spectacle prescription;  CTL contact lenses; OD right eye; OS left eye; OU both eyes  XT exotropia; ET esotropia; PEK punctate epithelial keratitis; PEE punctate epithelial erosions; DES dry eye  syndrome; MGD meibomian gland dysfunction; ATs artificial tears; PFAT's preservative free artificial tears; [place] nuclear sclerotic cataract; PSC posterior subcapsular cataract; ERM epi-retinal membrane; PVD posterior vitreous detachment; RD retinal detachment; DM diabetes mellitus; DR diabetic retinopathy; NPDR non-proliferative diabetic retinopathy; PDR proliferative diabetic retinopathy; CSME clinically significant macular edema; DME diabetic macular edema; dbh dot blot hemorrhages; CWS cotton wool spot; POAG primary open angle glaucoma; C/D cup-to-disc ratio; HVF humphrey visual field; GVF goldmann visual  field; OCT optical coherence tomography; IOP intraocular pressure; BRVO Branch retinal vein occlusion; CRVO central retinal vein occlusion; CRAO central retinal artery occlusion; BRAO branch retinal artery occlusion; RT retinal tear; SB scleral buckle; PPV pars plana vitrectomy; VH Vitreous hemorrhage; PRP panretinal laser photocoagulation; IVK intravitreal kenalog; VMT vitreomacular traction; MH Macular hole;  NVD neovascularization of the disc; NVE neovascularization elsewhere; AREDS age related eye disease study; ARMD age related macular degeneration; POAG primary open angle glaucoma; EBMD epithelial/anterior basement membrane dystrophy; ACIOL anterior chamber intraocular lens; IOL intraocular lens; PCIOL posterior chamber intraocular lens; Phaco/IOL phacoemulsification with intraocular lens placement; [place] photorefractive keratectomy; LASIK laser assisted in situ keratomileusis; HTN hypertension; DM diabetes mellitus; COPD chronic obstructive pulmonary disease

## 2021-04-29 NOTE — Assessment & Plan Note (Signed)
Patient continues with excellent compliance

## 2021-04-29 NOTE — Assessment & Plan Note (Signed)
History of vitrectomy membrane peel left eye May 2020

## 2021-05-07 DIAGNOSIS — S83242D Other tear of medial meniscus, current injury, left knee, subsequent encounter: Secondary | ICD-10-CM | POA: Diagnosis not present

## 2021-05-13 DIAGNOSIS — G4733 Obstructive sleep apnea (adult) (pediatric): Secondary | ICD-10-CM | POA: Diagnosis not present

## 2021-05-13 DIAGNOSIS — I10 Essential (primary) hypertension: Secondary | ICD-10-CM | POA: Diagnosis not present

## 2021-05-22 DIAGNOSIS — H903 Sensorineural hearing loss, bilateral: Secondary | ICD-10-CM | POA: Diagnosis not present

## 2021-05-22 DIAGNOSIS — H90A21 Sensorineural hearing loss, unilateral, right ear, with restricted hearing on the contralateral side: Secondary | ICD-10-CM | POA: Diagnosis not present

## 2021-05-22 DIAGNOSIS — H6123 Impacted cerumen, bilateral: Secondary | ICD-10-CM | POA: Diagnosis not present

## 2021-05-29 DIAGNOSIS — D225 Melanocytic nevi of trunk: Secondary | ICD-10-CM | POA: Diagnosis not present

## 2021-05-29 DIAGNOSIS — D485 Neoplasm of uncertain behavior of skin: Secondary | ICD-10-CM | POA: Diagnosis not present

## 2021-05-29 DIAGNOSIS — B351 Tinea unguium: Secondary | ICD-10-CM | POA: Diagnosis not present

## 2021-05-29 DIAGNOSIS — Z85828 Personal history of other malignant neoplasm of skin: Secondary | ICD-10-CM | POA: Diagnosis not present

## 2021-05-29 DIAGNOSIS — L570 Actinic keratosis: Secondary | ICD-10-CM | POA: Diagnosis not present

## 2021-05-29 DIAGNOSIS — C44319 Basal cell carcinoma of skin of other parts of face: Secondary | ICD-10-CM | POA: Diagnosis not present

## 2021-05-29 DIAGNOSIS — L578 Other skin changes due to chronic exposure to nonionizing radiation: Secondary | ICD-10-CM | POA: Diagnosis not present

## 2021-05-29 DIAGNOSIS — L853 Xerosis cutis: Secondary | ICD-10-CM | POA: Diagnosis not present

## 2021-05-29 DIAGNOSIS — Z86018 Personal history of other benign neoplasm: Secondary | ICD-10-CM | POA: Diagnosis not present

## 2021-05-30 ENCOUNTER — Other Ambulatory Visit: Payer: Self-pay | Admitting: Interventional Cardiology

## 2021-06-02 ENCOUNTER — Emergency Department (HOSPITAL_COMMUNITY)
Admission: EM | Admit: 2021-06-02 | Discharge: 2021-06-03 | Disposition: A | Discharge status: Home / Self Care | Payer: Medicare HMO | Attending: Emergency Medicine | Admitting: Emergency Medicine

## 2021-06-02 ENCOUNTER — Encounter (HOSPITAL_COMMUNITY): Payer: Self-pay

## 2021-06-02 ENCOUNTER — Other Ambulatory Visit: Payer: Self-pay

## 2021-06-02 ENCOUNTER — Emergency Department (HOSPITAL_COMMUNITY): Payer: Medicare HMO

## 2021-06-02 ENCOUNTER — Telehealth: Payer: Self-pay | Admitting: *Deleted

## 2021-06-02 DIAGNOSIS — R0789 Other chest pain: Secondary | ICD-10-CM | POA: Diagnosis not present

## 2021-06-02 DIAGNOSIS — R079 Chest pain, unspecified: Secondary | ICD-10-CM | POA: Diagnosis not present

## 2021-06-02 DIAGNOSIS — Z5321 Procedure and treatment not carried out due to patient leaving prior to being seen by health care provider: Secondary | ICD-10-CM | POA: Diagnosis not present

## 2021-06-02 LAB — COMPREHENSIVE METABOLIC PANEL
ALT: 15 U/L (ref 0–44)
AST: 17 U/L (ref 15–41)
Albumin: 4.4 g/dL (ref 3.5–5.0)
Alkaline Phosphatase: 71 U/L (ref 38–126)
Anion gap: 9 (ref 5–15)
BUN: 12 mg/dL (ref 8–23)
CO2: 24 mmol/L (ref 22–32)
Calcium: 9.5 mg/dL (ref 8.9–10.3)
Chloride: 106 mmol/L (ref 98–111)
Creatinine, Ser: 1.33 mg/dL — ABNORMAL HIGH (ref 0.61–1.24)
GFR, Estimated: 54 mL/min — ABNORMAL LOW (ref >60)
Glucose, Bld: 98 mg/dL (ref 70–99)
Potassium: 4.4 mmol/L (ref 3.5–5.1)
Sodium: 139 mmol/L (ref 135–145)
Total Bilirubin: 0.7 mg/dL (ref 0.3–1.2)
Total Protein: 7.2 g/dL (ref 6.5–8.1)

## 2021-06-02 LAB — CBC
HCT: 46.5 % (ref 39.0–52.0)
Hemoglobin: 15.6 g/dL (ref 13.0–17.0)
MCH: 28.6 pg (ref 26.0–34.0)
MCHC: 33.5 g/dL (ref 30.0–36.0)
MCV: 85.2 fL (ref 80.0–100.0)
Platelets: 280 10*3/uL (ref 150–400)
RBC: 5.46 MIL/uL (ref 4.22–5.81)
RDW: 13.2 % (ref 11.5–15.5)
WBC: 8.2 10*3/uL (ref 4.0–10.5)
nRBC: 0 % (ref 0.0–0.2)

## 2021-06-02 LAB — TROPONIN I (HIGH SENSITIVITY)
Troponin I (High Sensitivity): 7 ng/L (ref <18)
Troponin I (High Sensitivity): 7 ng/L (ref <18)

## 2021-06-02 NOTE — ED Provider Notes (Signed)
Emergency Medicine Provider Triage Evaluation Note  Jack Norris , a 80 y.o. male  was evaluated in triage.  Pt complains of chest tightness.  States that he was having chest pain yesterday which resolved and now only has chest tightness.  Some mild shortness of breath.  No cough  Review of Systems  Positive: Chest pain, SOB  Negative: Fever   Physical Exam  Ht 5\' 9"  (1.753 m)   Wt 99.8 kg   BMI 32.49 kg/m  Gen:   Awake, no distress   Resp:  Normal effort  MSK:   Moves extremities without difficulty  Other:  Lungs clear.  No acute distress.  Medical Decision Making  Medically screening exam initiated at 3:15 PM.  Appropriate orders placed.  Jack Norris was informed that the remainder of the evaluation will be completed by another provider, this initial triage assessment does not replace that evaluation, and the importance of remaining in the ED until their evaluation is complete.  Patient with chest tightness after episode of chest pain yesterday.  No other associated symptoms apart from some mild shortness of breath.   Tedd Sias, [place] 06/02/21 [place], [place], DO 06/03/21 0715

## 2021-06-02 NOTE — ED Notes (Signed)
Pt stated they were leaving  

## 2021-06-02 NOTE — ED Triage Notes (Signed)
Patient arrived by [facility] from home. Reports onset yesterday of chest pain that he describes as tightness and sitting on his chest.

## 2021-06-02 NOTE — Telephone Encounter (Signed)
Called and spoke to patient's wife [name] and was advised that her husband was taken to [facility] ER by EMS. [name] stated that she is at [facility] now standing in line to check him in. [name] stated that she will call back and cancel the appointment tomorrow with Dr. Diona Browner after she talks to her husband.

## 2021-06-02 NOTE — Telephone Encounter (Signed)
PLEASE NOTE: All timestamps contained within this report are represented as [place] Standard Time. CONFIDENTIALTY NOTICE: This fax transmission is intended only for the addressee. It contains information that is legally privileged, confidential or otherwise protected from use or disclosure. If you are not the intended recipient, you are strictly prohibited from reviewing, disclosing, copying using or disseminating any of this information or taking any action in reliance on or regarding this information. If you have received this fax in error, please notify us immediately by telephone so that we can arrange for its return to [place]. Phone: [phone], Toll-Free: [phone], Fax: [phone] Page: 1 of 2 Call Id: [id number] [place] [facility] - Client TELEPHONE ADVICE RECORD [facility] Patient Name: Jack Norris Gender: Male DOB: 07/14/1941 Age: 80 Y 2 M 13 D Return Phone Number: [phone] (Primary), [phone] (Secondary) Address: City/ State/ Zip: [place] [address] Client [facility] - Client Client Site [place] - Day Physician Eliezer Lofts - MD Contact Type Call Who Is Calling Patient / Member / Family / Caregiver Call Type Triage / Clinical Relationship To Patient Self Return Phone Number [phone] (Primary) Chief Complaint CHEST PAIN - pain, pressure, heaviness or tightness Reason for Call Symptomatic / Request for [place] being transferred from the office / he has an appt tomorrow caller feels as if someone is sitting on his chest while in his recliner-- patient has history of asthma Translation No Nurse Assessment Nurse: Toribio Harbour, RN, Joelene Millin Date/Time (Eastern Time): 06/02/2021 1:35:14 PM Confirm and document reason for call. If symptomatic, describe symptoms. ---Pt states he felt pressure in his chest for about 30 seconds yesterday afternoon and  is not having now. He is now having tightness in his chest. He does have asthma but is not wheezing. Does the patient have any new or worsening symptoms? ---Yes Will a triage be completed? ---Yes Related visit to physician within the last 2 weeks? ---No Does the PT have any chronic conditions? (i.e. diabetes, asthma, this includes High risk factors for pregnancy, etc.) ---Yes List chronic conditions. ---Asthma, arthritis Is this a behavioral health or substance abuse call? ---No Guidelines Guideline Title Affirmed Question Affirmed Notes Nurse Date/Time Eilene Ghazi Time) Chest Pain [1] Chest pain lasts > 5 minutes AND [2] age > 72 Toribio Harbour, RN, Joelene Millin 06/02/2021 1:39:22 PM Disp. Time Eilene Ghazi Time) Disposition Final User 06/02/2021 1:33:34 PM Send to Urgent Daron Offer, [place] PLEASE NOTE: All timestamps contained within this report are represented as [place] Standard Time. CONFIDENTIALTY NOTICE: This fax transmission is intended only for the addressee. It contains information that is legally privileged, confidential or otherwise protected from use or disclosure. If you are not the intended recipient, you are strictly prohibited from reviewing, disclosing, copying using or disseminating any of this information or taking any action in reliance on or regarding this information. If you have received this fax in error, please notify us immediately by telephone so that we can arrange for its return to [place]. Phone: [phone], Toll-Free: [phone], Fax: [phone] Page: 2 of 2 Call Id: [id number] [place]. Time Eilene Ghazi Time) Disposition Final User 06/02/2021 1:51:31 PM 911 Outcome Documentation Toribio Harbour, RN, Joelene Millin Reason: Left voicemail. 06/02/2021 1:45:48 PM Call EMS 911 Now Yes Toribio Harbour, RN, Renea Ee Disagree/Comply Comply Caller Understands Yes PreDisposition Call Doctor Care Advice Given Per Guideline CALL EMS 911 NOW: * Immediate medical attention is needed. You need to  hang up and call 911 (or an ambulance). * Mudlogger  Discretion: I'll call you back in a few minutes to be sure you were able to reach them. CARE ADVICE given per Chest Pain (Adult) guideline. Comments User: Devoria Albe, RN Date/Time Eilene Ghazi Time): 06/02/2021 1:39:17 PM Hx of hypertension and high cholesterol Referrals GO TO FACILITY UNDECIDED

## 2021-06-02 NOTE — Telephone Encounter (Signed)
Noted.  Agree with ER evaluation

## 2021-06-03 ENCOUNTER — Ambulatory Visit (INDEPENDENT_AMBULATORY_CARE_PROVIDER_SITE_OTHER): Payer: Medicare HMO | Admitting: Family Medicine

## 2021-06-03 ENCOUNTER — Encounter: Payer: Self-pay | Admitting: Family Medicine

## 2021-06-03 ENCOUNTER — Ambulatory Visit: Payer: Medicare HMO | Admitting: Family Medicine

## 2021-06-03 ENCOUNTER — Other Ambulatory Visit: Payer: Self-pay

## 2021-06-03 VITALS — BP 122/66 | HR 83 | Temp 97.9°F | Ht 69.0 in | Wt 224.0 lb

## 2021-06-03 DIAGNOSIS — R0789 Other chest pain: Secondary | ICD-10-CM | POA: Insufficient documentation

## 2021-06-03 DIAGNOSIS — R051 Acute cough: Secondary | ICD-10-CM | POA: Diagnosis not present

## 2021-06-03 MED ORDER — PREDNISONE 20 MG PO TABS: ORAL_TABLET | ORAL | 0 refills | Status: DC

## 2021-06-03 MED ORDER — AZITHROMYCIN 250 MG PO TABS: ORAL_TABLET | ORAL | 0 refills | Status: DC

## 2021-06-03 MED ORDER — ALBUTEROL SULFATE HFA 108 (90 BASE) MCG/ACT IN AERS
2.0000 | INHALATION_SPRAY | Freq: Four times a day (QID) | RESPIRATORY_TRACT | 2 refills | Status: DC | PRN
Start: 2021-06-03 — End: 2022-09-28

## 2021-06-03 NOTE — Telephone Encounter (Signed)
Called and spoke to patient to confirm that he is going to keep his appointment today with Dr. Diona Browner since he went to the ER yesterday. Patient stated that after 9 hours he was not seen at the ED so he checked himself out. Patient stated that he stated with a mild cough about 2 days ago and has mild chest congestion. Patient stated that he has travelled on a plane but did wear a mask. Patient denies any other symptoms stating that he has a history of asthma. Patient stated that he has not done a covid test and his home covid test expired 04/10/21.  Advised patient that I will let Dr. Diona Browner know what his symptoms are and will be back in touch about his appointment this afternoon.

## 2021-06-03 NOTE — Telephone Encounter (Signed)
Patient notified as instructed by telephone and verbalized understanding. Patient stated that he was on his way to his appointment. Patient stated that he will stop at [facility] and pick up a home covid test and will go home and perform the test. Patient was advised that I will call him back and get the results from him shortly.

## 2021-06-03 NOTE — Telephone Encounter (Signed)
He needs to do a home COVID test before being seen in office... move him back to appt later in day... 3:40 PM or 4:15 PM.  If negative can be seen given  vaccinated and no known exposure and mild symptoms.

## 2021-06-03 NOTE — Progress Notes (Signed)
Patient ID: ANISH VANA, male    DOB: [birth year], 80 y.o.   MRN: [id number]  This visit was conducted in person. Vitals:   06/03/21 1639  BP: 122/66  Pulse: 83  Temp: 97.9 F (36.6 C)  SpO2: 95%     CC SOB/CP  Subjective:   HPI: VICTORIO CREEDEN is a 80 y.o. male  with history of  CAD, S/P CBG x 4, HTN, GERD, PSA presenting on 06/03/2021 for  CP  He was seen in ER yesterday for chest  tightness.  He left without being seen in ER.   EKG:  Troponin I x 2 normal  CBC and CMET: normal, except creatinine 1.33  Neg home COVID test.  He reports  2 days ago, at rest lying in recliner taking a nap.Marland Kitchen awoke with pressure on chest, no pain.Marland Kitchen lasted 45 seconds.  Resolved.  Started having chest congestion, mild dry cough.  Yesterday  continued with mild chest tightness... EMS, EKG  normal.  Went to [facility] EKG unremarkable.  CXR clear.   No associated symptoms, no C,D.  No fever, no ear pain, no sinus pain.  Last Cardiology OV reviewed from 03/13/2021    On prilosec 20 mg daily  BP Readings from Last 3 Encounters:  06/03/21 122/66  06/02/21 125/80  03/13/21 108/60   COVID 19 screen COVID testing: negative home test COVID vaccine: 06/07/2020 , 10/25/2019 , 10/04/2019, 7/ 18/2022 COVID exposure: No recent travel or known exposure to [place]  The importance of social distancing was discussed today.  .   Relevant past medical, surgical, family and social history reviewed and updated as indicated. Interim medical history since our last visit reviewed. Allergies and medications reviewed and updated. Outpatient Medications Prior to Visit  Medication Sig Dispense Refill   albuterol (PROAIR HFA) 108 (90 Base) MCG/ACT inhaler Inhale 2 puffs into the lungs every 6 (six) hours as needed for wheezing or shortness of breath. 18 g 2   amLODipine (NORVASC) 10 MG tablet Take 1 tablet (10 mg total) by mouth daily. 180 tablet 3   aspirin 81 MG tablet Take 81 mg by mouth daily.      B-D 3CC LUER-LOK SYR 18GX1-1/2 18G X 1-1/2" 3 ML MISC USE ONCE A WEEK AS DIRECTED  2   fenofibrate micronized (LOFIBRA) 67 MG capsule TAKE 1 CAPSULE(67 MG) BY MOUTH DAILY BEFORE BREAKFAST 90 capsule 3   lisinopril (ZESTRIL) 40 MG tablet Take 1 tablet (40 mg total) by mouth daily. 90 tablet 3   omeprazole (PRILOSEC) 20 MG capsule Take 20 mg by mouth daily. Per patient OTC     psyllium (METAMUCIL) 58.6 % powder Take 1 packet by mouth daily.     rosuvastatin (CRESTOR) 5 MG tablet TAKE 1 TABLET(5 MG) BY MOUTH DAILY 90 tablet 2   testosterone cypionate (DEPOTESTOSTERONE CYPIONATE) 200 MG/ML injection Inject 0.5 mLs (100 mg total) into the muscle every 7 (seven) days. 10 mL 0   triamcinolone (KENALOG) 0.025 % cream Apply 1 application topically as needed.     No facility-administered medications prior to visit.     Per HPI unless specifically indicated in ROS section below Review of Systems  Constitutional:  Negative for fatigue and fever.  HENT:  Negative for ear pain.   Eyes:  Negative for pain.  Respiratory:  Positive for cough and shortness of breath.   Cardiovascular:  Negative for chest pain, palpitations and leg swelling.  Gastrointestinal:  Negative for abdominal pain.  Genitourinary:  Negative for dysuria.  Musculoskeletal:  Negative for arthralgias.  Neurological:  Negative for syncope, light-headedness and headaches.  Psychiatric/Behavioral:  Negative for dysphoric mood.   Objective:  There were no vitals taken for this visit.  Wt Readings from Last 3 Encounters:  06/02/21 220 lb (99.8 kg)  03/13/21 219 lb 9.6 oz (99.6 kg)  02/27/21 220 lb (99.8 kg)      Physical Exam Constitutional:      Appearance: He is well-developed.  HENT:     Head: Normocephalic.     Right Ear: Hearing normal.     Left Ear: Hearing normal.     Nose: Nose normal.     Mouth/Throat:     Pharynx: Oropharynx is clear.  Neck:     Thyroid: No thyroid mass or thyromegaly.     Vascular: No carotid  bruit.     Trachea: Trachea normal.  Cardiovascular:     Rate and Rhythm: Normal rate and regular rhythm.     Pulses: Normal pulses.     Heart sounds: Heart sounds not distant. No murmur heard.   No friction rub. No gallop.     Comments: No peripheral edema Pulmonary:     Effort: Pulmonary effort is normal. No respiratory distress.     Breath sounds: Examination of the right-upper field reveals wheezing. Examination of the right-middle field reveals wheezing. Examination of the right-lower field reveals wheezing. Wheezing present.  Skin:    General: Skin is warm and dry.     Findings: No rash.  Psychiatric:        Speech: Speech normal.        Behavior: Behavior normal.        Thought Content: Thought content normal.      Results for orders placed or performed during the hospital encounter of 06/02/21  CBC  Result Value Ref Range   WBC 8.2 4.0 - 10.5 K/uL   RBC 5.46 4.22 - 5.81 MIL/uL   Hemoglobin 15.6 13.0 - 17.0 g/dL   HCT 46.5 39.0 - 52.0 %   MCV 85.2 80.0 - 100.0 fL   MCH 28.6 26.0 - 34.0 pg   MCHC 33.5 30.0 - 36.0 g/dL   RDW 13.2 11.5 - 15.5 %   Platelets 280 150 - 400 K/uL   nRBC 0.0 0.0 - 0.2 %  Comprehensive metabolic panel  Result Value Ref Range   Sodium 139 135 - 145 mmol/L   Potassium 4.4 3.5 - 5.1 mmol/L   Chloride 106 98 - 111 mmol/L   CO2 24 22 - 32 mmol/L   Glucose, Bld 98 70 - 99 mg/dL   BUN 12 8 - 23 mg/dL   Creatinine, Ser 1.33 (H) 0.61 - 1.24 mg/dL   Calcium 9.5 8.9 - 10.3 mg/dL   Total Protein 7.2 6.5 - 8.1 g/dL   Albumin 4.4 3.5 - 5.0 g/dL   AST 17 15 - 41 U/L   ALT 15 0 - 44 U/L   Alkaline Phosphatase 71 38 - 126 U/L   Total Bilirubin 0.7 0.3 - 1.2 mg/dL   GFR, Estimated 54 (L) >60 mL/min   Anion gap 9 5 - 15  Troponin I (High Sensitivity)  Result Value Ref Range   Troponin I (High Sensitivity) 7 <18 ng/L  Troponin I (High Sensitivity)  Result Value Ref Range   Troponin I (High Sensitivity) 7 <18 ng/L    This visit occurred during the  SARS-CoV-2 public health emergency.  Safety protocols were in place,  including screening questions prior to the visit, additional usage of staff PPE, and extensive cleaning of exam room while observing appropriate contact time as indicated for disinfecting solutions.   COVID 19 screen:  No recent travel or known exposure to COVID19 The patient denies respiratory symptoms of COVID 19 at this time. The importance of social distancing was discussed today.   Assessment and Plan   Acute cough and chest tightness.. most likely reactive airway to either viral or bacterial trigger.   COVID test negative x 1 today , home. Repeat in 2 days to verify... consider antiviral if returns positive.   Treat with azithromycin for possible bacterial source.   Treat reactive airway component ( has had similar in past in response to infection) with course of prednisone and albuterol prn.  Reviewed ER notes.. neg Cardiac eval with troponin I negative x 2 and stable EKG.   Eliezer Lofts, MD

## 2021-06-03 NOTE — Telephone Encounter (Signed)
Spoke to patient by telephone and was advised that his covid test was negative. Patient is scheduled to see Dr. Diona Browner this afternoon at 4:20.

## 2021-06-03 NOTE — Patient Instructions (Signed)
Complete antibiotics and course of prednisone.  Use albuterol as needed.  Recheck COVID home test in  2 days, call if positive.  Go to ER if shortness of breath.

## 2021-06-04 DIAGNOSIS — S83242D Other tear of medial meniscus, current injury, left knee, subsequent encounter: Secondary | ICD-10-CM | POA: Diagnosis not present

## 2021-06-12 MED ORDER — DOXYCYCLINE HYCLATE 100 MG PO TABS: 100.0000 mg | ORAL_TABLET | Freq: Two times a day (BID) | ORAL | 0 refills | Status: DC

## 2021-06-12 NOTE — Telephone Encounter (Signed)
Pt called and said hes feeling a little better but not much better and pt said he has a funeral to go to this weekend in [place] and he wasn't sure if it was a good idea, He wasn't sure if he was to contagious for it and just wanted an opinion on whether he should go or not. He doesn't want to get anyone sick. Callback [phone]

## 2021-07-07 DIAGNOSIS — C4491 Basal cell carcinoma of skin, unspecified: Secondary | ICD-10-CM | POA: Diagnosis not present

## 2021-07-07 DIAGNOSIS — C44319 Basal cell carcinoma of skin of other parts of face: Secondary | ICD-10-CM | POA: Diagnosis not present

## 2021-07-07 HISTORY — PX: SKIN LESION EXCISION: SHX2412

## 2021-07-07 NOTE — Progress Notes (Signed)
Subjective:   Jack Norris is a 80 y.o. male who presents for Medicare Annual/Subsequent preventive examination.  I connected with Sirr Kabel today by telephone and verified that I am speaking with the correct person using two identifiers. Location patient: home Location provider: work Persons participating in the virtual visit: patient, Marine scientist.    I discussed the limitations, risks, security and privacy concerns of performing an evaluation and management service by telephone and the availability of in person appointments. I also discussed with the patient that there may be a patient responsible charge related to this service. The patient expressed understanding and verbally consented to this telephonic visit.    Interactive audio and video telecommunications were attempted between this provider and patient, however failed, due to patient having technical difficulties OR patient did not have access to video capability.  We continued and completed visit with audio only.  Some vital signs may be absent or patient reported.   Time Spent with patient on telephone encounter: 30 minutes  Review of Systems     Cardiac Risk Factors include: advanced age (>80men, >44 women);dyslipidemia;hypertension     Objective:    Today's Vitals   07/11/21 1216 07/11/21 1217  Weight: 224 lb (101.6 kg)   Height: 5\' 9"  (1.753 m)   PainSc:  2    Body mass index is 33.08 kg/m.  Advanced Directives 07/11/2021 07/10/2020 06/15/2019 04/22/2018 04/08/2018 04/20/2017 04/16/2016  Does Patient Have a Medical Advance Directive? No No No No No No No  Would patient like information on creating a medical advance directive? Yes (MAU/Ambulatory/Procedural Areas - Information given) No - Patient declined No - Patient declined No - Patient declined No - Patient declined - Yes - Educational materials given  Pre-existing out of facility DNR order (yellow form or pink MOST form) - - - - - - -    Current  Medications (verified) Outpatient Encounter Medications as of 07/11/2021  Medication Sig   albuterol (VENTOLIN HFA) 108 (90 Base) MCG/ACT inhaler Inhale 2 puffs into the lungs every 6 (six) hours as needed for wheezing or shortness of breath.   aspirin 81 MG tablet Take 81 mg by mouth daily.   B-D 3CC LUER-LOK SYR 18GX1-1/2 18G X 1-1/2" 3 ML MISC USE ONCE A WEEK AS DIRECTED   fenofibrate micronized (LOFIBRA) 67 MG capsule TAKE 1 CAPSULE(67 MG) BY MOUTH DAILY BEFORE BREAKFAST   lisinopril (ZESTRIL) 40 MG tablet Take 1 tablet (40 mg total) by mouth daily.   omeprazole (PRILOSEC) 20 MG capsule Take 20 mg by mouth daily. Per patient OTC   psyllium (METAMUCIL) 58.6 % powder Take 1 packet by mouth daily.   rosuvastatin (CRESTOR) 5 MG tablet TAKE 1 TABLET(5 MG) BY MOUTH DAILY   testosterone cypionate (DEPOTESTOSTERONE CYPIONATE) 200 MG/ML injection Inject 0.5 mLs (100 mg total) into the muscle every 7 (seven) days.   triamcinolone (KENALOG) 0.025 % cream Apply 1 application topically as needed.   amLODipine (NORVASC) 10 MG tablet Take 1 tablet (10 mg total) by mouth daily.   azithromycin (ZITHROMAX) 250 MG tablet 2 tab po x 1 day then 1 tab po daily   doxycycline (VIBRA-TABS) 100 MG tablet Take 1 tablet (100 mg total) by mouth 2 (two) times daily.   predniSONE (DELTASONE) 20 MG tablet 3 tabs by mouth daily x 3 days, then 2 tabs by mouth daily x 2 days then 1 tab by mouth daily x 2 days   No facility-administered encounter medications on file as of  07/11/2021.    Allergies (verified) Paroxetine, Morphine and related, Ciprofloxacin, and Flagyl [metronidazole hcl]   History: Past Medical History:  Diagnosis Date   Asthma, mild    as child-only now if has URI   BPH (benign prostatic hypertrophy)    BPPV (benign paroxysmal positional vertigo)    Diverticulosis    Eczema    Fatty liver disease, nonalcoholic    Frequent nosebleeds    History of   GERD (gastroesophageal reflux disease)    occ  uses TUMS as needed   Glaucoma    History of bronchitis    History of colon polyps    History of diverticulitis    History of kidney stones    Hyperlipemia    Hypertension    Influenza A 11/2017   Memory changes    OA (osteoarthritis)    Obesity    Overactive bladder 2020   PVCs (premature ventricular contractions)    Renal calculi right    AND LEFT X1 NON-OBSTRUCTIVE   Right sided sciatica    Bilateral buttock and thighs   Simple renal cyst right interpoler, simpler in nature , asymptomatic  per urologist note (dr dahlstedt)  12-26-2011   BENIGN, has been drained   Testosterone deficiency    Wears glasses    Wears hearing aid    Past Surgical History:  Procedure Laterality Date   APPENDECTOMY     bone spur removal     BILATERAL GREAT TOE   COLONOSCOPY     CYSTO/ RIGHT RETROGRADE PYELOGRAM/ RIGHT URETER DILATATION/ RIGHT URETEROSCOPY/ LASER LITHOTRIPSY RIGHT RENAL PELVIS AND LOWER POLE CALCULI/ PLACEMENT STENT  01-11-2012  DR DAHLSTEDT   RIGHT RENAL PELVIS AND LOWER POLE CALCULI   CYSTOSCOPY W/ RETROGRADES  06/02/2012   Procedure: CYSTOSCOPY WITH RETROGRADE PYELOGRAM;  Surgeon: Franchot Gallo, MD;  Location: [facility];  Service: Urology;  Laterality: Right;   CYSTOSCOPY WITH URETEROSCOPY  06/02/2012   Procedure: CYSTOSCOPY WITH URETEROSCOPY;  Surgeon: Franchot Gallo, MD;  Location: [facility];  Service: Urology;  Laterality: Right;  90 MIN ALSO EXTRACTION OF CALCULI    EYE SURGERY Bilateral 2020   LITHOTRIPSY  APPROX.  1998   ESWL  left kidney x 2   ROTATOR CUFF REPAIR  2012   LEFT SHOULDER   SHOULDER ARTHROSCOPY WITH ROTATOR CUFF REPAIR AND SUBACROMIAL DECOMPRESSION  09/12/2012   Procedure: SHOULDER ARTHROSCOPY WITH ROTATOR CUFF REPAIR AND SUBACROMIAL DECOMPRESSION;  Surgeon: Nita Sells, MD;  Location: [facility];  Service: Orthopedics;  Laterality: Right;  see above and biceps tendonotomy   SKIN LESION  EXCISION Right 07/07/2021   right side of face near right ear   TRANSURETHRAL RESECTION OF BLADDER NECK N/A 04/08/2018   Procedure: TRANSURETHRAL RESECTION OF BLADDER NECK CONTRACTURE;  Surgeon: Franchot Gallo, MD;  Location: [facility];  Service: Urology;  Laterality: N/A;   TRANSURETHRAL RESECTION OF PROSTATE  11/29/2009   BPH   VASECTOMY  1970'S   GEN. ANES.   Family History  Problem Relation Age of Onset   Cancer Mother        breast and kidney    Squamous cell carcinoma Mother    Cancer Father        lung   Heart disease Father    Social History   Socioeconomic History   Marital status: Married    Spouse name: Not on file   Number of children: 3   Years of education: college  Highest education level: Bachelor's degree (e.g., BA, AB, BS)  Occupational History   Occupation: QUOTATION Lobbyist: [employer]  Tobacco Use   Smoking status: Former    Years: 8.00    Types: Cigarettes    Quit date: 01/05/1967    Years since quitting: 54.5   Smokeless tobacco: Never   Tobacco comments:    QUIT AGE 57  Vaping Use   Vaping Use: Never used  Substance and Sexual Activity   Alcohol use: Yes    Alcohol/week: 0.0 standard drinks    Comment: OCCASIONAL   Drug use: No   Sexual activity: Yes  Other Topics Concern   Not on file  Social History Narrative   regular exercise    Full code, (Reviewed 2014)      Lives at home with his wife.   Right-handed.   Caffeine use: 4 cups per day.   Social Determinants of Health   Financial Resource Strain: Low Risk    Difficulty of Paying Living Expenses: Not hard at all  Food Insecurity: No Food Insecurity   Worried About Charity fundraiser in the Last Year: Never true   [place] in the Last Year: Never true  Transportation Needs: No Transportation Needs   Lack of Transportation (Medical): No   Lack of Transportation (Non-Medical): No  Physical Activity: Inactive   Days of Exercise per  Week: 0 days   Minutes of Exercise per Session: 0 min  Stress: No Stress Concern Present   Feeling of Stress : Not at all  Social Connections: Socially Integrated   Frequency of Communication with Friends and Family: More than three times a week   Frequency of Social Gatherings with Friends and Family: Twice a week   Attends Religious Services: More than 4 times per year   Active Member of [employer] or Organizations: Yes   Attends Archivist Meetings: Never   Marital Status: Married    Tobacco Counseling Counseling given: Not Answered Tobacco comments: QUIT AGE 57   Clinical Intake:  Pre-visit preparation completed: Yes  Pain : 0-10 Pain Score: 2  Pain Location: Face     BMI - recorded: 33.06 Nutritional Status: BMI > 30  Obese Nutritional Risks: None  How often do you need to have someone help you when you read instructions, pamphlets, or other written materials from your doctor or pharmacy?: 1 - Never  Diabetic?No  Interpreter Needed?: No  Information entered by :: Orrin Brigham LPN   Activities of Daily Living In your present state of health, do you have any difficulty performing the following activities: 07/11/2021  Hearing? Y  Vision? N  Difficulty concentrating or making decisions? N  Comment age related  Walking or climbing stairs? Y  Comment arthritis and knee surgery  Dressing or bathing? N  Doing errands, shopping? N  Preparing Food and eating ? N  Using the Toilet? N  In the past six months, have you accidently leaked urine? Y  Do you have problems with loss of bowel control? N  Managing your Medications? N  Managing your Finances? N  Housekeeping or managing your Housekeeping? N  Some recent data might be hidden    Patient Care Team: Jinny Sanders, MD as PCP - General Jettie Booze, MD as PCP - Cardiology (Cardiology) Clent Jacks, MD as Consulting Physician (Ophthalmology) Franchot Gallo, MD as Consulting Physician  (Urology) Jannet Mantis, MD as Consulting Physician (Dermatology)  Indicate any  recent Medical Services you may have received from other than [facility] providers in the past year (date may be approximate).     Assessment:   This is a routine wellness examination for Derin.  Hearing/Vision screen Hearing Screening - Comments:: Wears hearing aids in both ears Vision Screening - Comments:: Last exam 2022, Dr. Schuyler Amor   Dietary issues and exercise activities discussed: Current Exercise Habits: The patient does not participate in regular exercise at present, Exercise limited by: orthopedic condition(s)   Goals Addressed             This Visit's Progress    Patient Stated       Would like to get back to walking.        Depression Screen PHQ 2/9 Scores 07/11/2021 07/10/2020 06/15/2019 04/22/2018 04/20/2017 04/16/2016 04/15/2015  PHQ - 2 Score 0 0 0 0 0 0 0  PHQ- 9 Score - 0 0 0 - - -    Fall Risk Fall Risk  07/11/2021 07/10/2020 06/15/2019 04/22/2018 04/20/2017  Falls in the past year? 0 0 0 No No  Number falls in past yr: 0 0 0 - -  Injury with Fall? 0 0 0 - -  Risk for fall due to : No Fall Risks - Medication side effect;Impaired balance/gait - -  Follow up Falls prevention discussed Falls evaluation completed;Falls prevention discussed Falls evaluation completed;Falls prevention discussed - -    FALL RISK PREVENTION PERTAINING TO THE HOME:  Any stairs in or around the home? Yes  If so, are there any without handrails? No  Home free of loose throw rugs in walkways, pet beds, electrical cords, etc? Yes  Adequate lighting in your home to reduce risk of falls? Yes   ASSISTIVE DEVICES UTILIZED TO PREVENT FALLS:  Life alert? No  Use of a cane, walker or w/c? No  Grab bars in the bathroom? No  Shower chair or bench in shower? No  Elevated toilet seat or a handicapped toilet? No   TIMED UP AND GO:  Was the test performed? No , visit completed over the phone.      Cognitive Function: Normal cognitive status assessed by this Nurse Health Advisor. No abnormalities found.   MMSE - Mini Mental State Exam 07/10/2020 07/04/2019 06/15/2019 04/22/2018 04/20/2017  Orientation to time 5 5 5 5 5   Orientation to Place 5 5 5 5 5   Registration 3 3 3 3 3   Attention/ Calculation 5 5 5  0 0  Recall 3 2 3 3 3   Language- name 2 objects - 2 - 0 0  Language- repeat 1 1 1 1 1   Language- follow 3 step command - 3 - 3 3  Language- read & follow direction - 1 - 0 0  Write a sentence - 1 - 0 0  Copy design - 1 - 0 0  Total score - 29 - 20 20        Immunizations Immunization History  Administered Date(s) Administered   Fluad Quad(high Dose 65+) 06/16/2019, 08/02/2020   Influenza Split 06/26/2011, 07/11/2012   Influenza Whole 05/24/2008, 06/24/2008, 07/04/2009, 05/24/2010   Influenza, High Dose Seasonal PF 06/14/2018   Influenza,inj,Quad PF,6+ Mos 07/26/2013, 06/12/2014, 04/15/2015, 04/16/2016, 07/01/2017   PFIZER(Purple Top)SARS-COV-2 Vaccination 10/04/2019, 10/25/2019, 06/07/2020, 03/10/2021   Pneumococcal Conjugate-13 04/15/2015   Pneumococcal Polysaccharide-23 09/14/2007   Td 08/02/2007   Zoster, Live 04/08/2009    TDAP status: Due, Education has been provided regarding the importance of this vaccine. Advised may receive this vaccine at  local pharmacy or Health Dept. Aware to provide a copy of the vaccination record if obtained from local pharmacy or Health Dept. Verbalized acceptance and understanding.  Flu Vaccine status: Due, Education has been provided regarding the importance of this vaccine. Advised may receive this vaccine at local pharmacy or Health Dept. Aware to provide a copy of the vaccination record if obtained from local pharmacy or Health Dept. Verbalized acceptance and understanding.  Pneumococcal vaccine status: Up to date  Covid-19 vaccine status: Information provided on how to obtain vaccines.   Qualifies for Shingles Vaccine? Yes    Zostavax completed No   Shingrix Completed?: No.    Education has been provided regarding the importance of this vaccine. Patient has been advised to call insurance company to determine out of pocket expense if they have not yet received this vaccine. Advised may also receive vaccine at local pharmacy or Health Dept. Verbalized acceptance and understanding.  Screening Tests Health Maintenance  Topic Date Due   Zoster Vaccines- Shingrix (1 of 2) Never done   INFLUENZA VACCINE  03/24/2021   COVID-19 Vaccine (5 - Booster for Pfizer series) 05/05/2021   TETANUS/TDAP  07/10/2024 (Originally 08/01/2017)   Pneumonia Vaccine 20+ Years old  Completed   HPV VACCINES  Aged Out    Health Maintenance  Health Maintenance Due  Topic Date Due   Zoster Vaccines- Shingrix (1 of 2) Never done   INFLUENZA VACCINE  03/24/2021   COVID-19 Vaccine (5 - Booster for Pfizer series) 05/05/2021    Colorectal cancer screening: No longer required.   Lung Cancer Screening: (Low Dose CT Chest recommended if Age 52-80 years, 30 pack-year currently smoking OR have quit w/in 15years.) does not qualify.     Additional Screening:  Hepatitis C Screening: does qualify;   Vision Screening: Recommended annual ophthalmology exams for early detection of glaucoma and other disorders of the eye. Is the patient up to date with their annual eye exam?  Yes  Who is the provider or what is the name of the office in which the patient attends annual eye exams? Dr. Schuyler Amor sr.   Dental Screening: Recommended annual dental exams for proper oral hygiene  Community Resource Referral / Chronic Care Management: CRR required this visit?  No   CCM required this visit?  No      Plan:     I have personally reviewed and noted the following in the patient's chart:   Medical and social history Use of alcohol, tobacco or illicit drugs  Current medications and supplements including opioid prescriptions. Patient is not currently  taking opioid prescriptions. Functional ability and status Nutritional status Physical activity Advanced directives List of other physicians Hospitalizations, surgeries, and ER visits in previous 12 months Vitals Screenings to include cognitive, depression, and falls Referrals and appointments  In addition, I have reviewed and discussed with patient certain preventive protocols, quality metrics, and best practice recommendations. A written personalized care plan for preventive services as well as general preventive health recommendations were provided to patient.   Due to this being a telephonic visit, the after visit summary with patients personalized plan was offered to patient via mail or my-chart. Patient would like to access on my-chart.   Loma Messing, LPN  [id number]   Nurse Health Advisor  Nurse Notes: None

## 2021-07-11 ENCOUNTER — Ambulatory Visit (INDEPENDENT_AMBULATORY_CARE_PROVIDER_SITE_OTHER): Payer: Medicare HMO

## 2021-07-11 VITALS — Ht 69.0 in | Wt 224.0 lb

## 2021-07-11 DIAGNOSIS — Z0000 Encounter for general adult medical examination without abnormal findings: Secondary | ICD-10-CM

## 2021-07-11 NOTE — Patient Instructions (Signed)
[name] , Thank you for taking time to complete your Medicare Wellness Visit. I appreciate your ongoing commitment to your health goals. Please review the following plan we discussed and let me know if I can assist you in the future.   Screening recommendations/referrals: Colonoscopy: no longer required  Recommended yearly ophthalmology/optometry visit for glaucoma screening and checkup Recommended yearly dental visit for hygiene and checkup  Vaccinations: Influenza vaccine: Due-May obtain vaccine at our office or your local pharmacy. Pneumococcal vaccine: up to date Tdap vaccine: Due-May obtain vaccine at your local pharmacy. Shingles vaccine: Discuss with your local pharmacy Covid-19: Newest booster available at your local pharmacy  Advanced directives: information available at your next visit  Conditions/risks identified: see problem list  Next appointment: Follow up in one year for your annual wellness visit.   Preventive Care 39 Years and Older, Male Preventive care refers to lifestyle choices and visits with your health care provider that can promote health and wellness. What does preventive care include? A yearly physical exam. This is also called an annual well check. Dental exams once or twice a year. Routine eye exams. Ask your health care provider how often you should have your eyes checked. Personal lifestyle choices, including: Daily care of your teeth and gums. Regular physical activity. Eating a healthy diet. Avoiding tobacco and drug use. Limiting alcohol use. Practicing safe sex. Taking low doses of aspirin every day. Taking vitamin and mineral supplements as recommended by your health care provider. What happens during an annual well check? The services and screenings done by your health care provider during your annual well check will depend on your age, overall health, lifestyle risk factors, and family history of disease. Counseling  Your health care  provider may ask you questions about your: Alcohol use. Tobacco use. Drug use. Emotional well-being. Home and relationship well-being. Sexual activity. Eating habits. History of falls. Memory and ability to understand (cognition). Work and work Statistician. Screening  You may have the following tests or measurements: Height, weight, and BMI. Blood pressure. Lipid and cholesterol levels. These may be checked every 5 years, or more frequently if you are over 17 years old. Skin check. Lung cancer screening. You may have this screening every year starting at age 80 if you have a 30-pack-year history of smoking and currently smoke or have quit within the past 15 years. Fecal occult blood test (FOBT) of the stool. You may have this test every year starting at age 80. Flexible sigmoidoscopy or colonoscopy. You may have a sigmoidoscopy every 5 years or a colonoscopy every 10 years starting at age 80. Prostate cancer screening. Recommendations will vary depending on your family history and other risks. Hepatitis C blood test. Hepatitis B blood test. Sexually transmitted disease (STD) testing. Diabetes screening. This is done by checking your blood sugar (glucose) after you have not eaten for a while (fasting). You may have this done every 1-3 years. Abdominal aortic aneurysm (AAA) screening. You may need this if you are a current or former smoker. Osteoporosis. You may be screened starting at age 80 if you are at high risk. Talk with your health care provider about your test results, treatment options, and if necessary, the need for more tests. Vaccines  Your health care provider may recommend certain vaccines, such as: Influenza vaccine. This is recommended every year. Tetanus, diphtheria, and acellular pertussis (Tdap, Td) vaccine. You may need a Td booster every 10 years. Zoster vaccine. You may need this after age 80. Pneumococcal  13-valent conjugate (PCV13) vaccine. One dose is  recommended after age 80. Pneumococcal polysaccharide (PPSV23) vaccine. One dose is recommended after age 80. Talk to your health care provider about which screenings and vaccines you need and how often you need them. This information is not intended to replace advice given to you by your health care provider. Make sure you discuss any questions you have with your health care provider. Document Released: 09/06/2015 Document Revised: 04/29/2016 Document Reviewed: 06/11/2015 Elsevier Interactive Patient Education  2017 [place] Prevention in the Home Falls can cause injuries. They can happen to people of all ages. There are many things you can do to make your home safe and to help prevent falls. What can I do on the outside of my home? Regularly fix the edges of walkways and driveways and fix any cracks. Remove anything that might make you trip as you walk through a door, such as a raised step or threshold. Trim any bushes or trees on the path to your home. Use bright outdoor lighting. Clear any walking paths of anything that might make someone trip, such as rocks or tools. Regularly check to see if handrails are loose or broken. Make sure that both sides of any steps have handrails. Any raised decks and porches should have guardrails on the edges. Have any leaves, snow, or ice cleared regularly. Use sand or salt on walking paths during winter. Clean up any spills in your garage right away. This includes oil or grease spills. What can I do in the bathroom? Use night lights. Install grab bars by the toilet and in the tub and shower. Do not use towel bars as grab bars. Use non-skid mats or decals in the tub or shower. If you need to sit down in the shower, use a plastic, non-slip stool. Keep the floor dry. Clean up any water that spills on the floor as soon as it happens. Remove soap buildup in the tub or shower regularly. Attach bath mats securely with double-sided non-slip rug  tape. Do not have throw rugs and other things on the floor that can make you trip. What can I do in the bedroom? Use night lights. Make sure that you have a light by your bed that is easy to reach. Do not use any sheets or blankets that are too big for your bed. They should not hang down onto the floor. Have a firm chair that has side arms. You can use this for support while you get dressed. Do not have throw rugs and other things on the floor that can make you trip. What can I do in the kitchen? Clean up any spills right away. Avoid walking on wet floors. Keep items that you use a lot in easy-to-reach places. If you need to reach something above you, use a strong step stool that has a grab bar. Keep electrical cords out of the way. Do not use floor polish or wax that makes floors slippery. If you must use wax, use non-skid floor wax. Do not have throw rugs and other things on the floor that can make you trip. What can I do with my stairs? Do not leave any items on the stairs. Make sure that there are handrails on both sides of the stairs and use them. Fix handrails that are broken or loose. Make sure that handrails are as long as the stairways. Check any carpeting to make sure that it is firmly attached to the stairs. Fix any carpet  that is loose or worn. Avoid having throw rugs at the top or bottom of the stairs. If you do have throw rugs, attach them to the floor with carpet tape. Make sure that you have a light switch at the top of the stairs and the bottom of the stairs. If you do not have them, ask someone to add them for you. What else can I do to help prevent falls? Wear shoes that: Do not have high heels. Have rubber bottoms. Are comfortable and fit you well. Are closed at the toe. Do not wear sandals. If you use a stepladder: Make sure that it is fully opened. Do not climb a closed stepladder. Make sure that both sides of the stepladder are locked into place. Ask someone to  hold it for you, if possible. Clearly mark and make sure that you can see: Any grab bars or handrails. First and last steps. Where the edge of each step is. Use tools that help you move around (mobility aids) if they are needed. These include: Canes. Walkers. Scooters. Crutches. Turn on the lights when you go into a dark area. Replace any light bulbs as soon as they burn out. Set up your furniture so you have a clear path. Avoid moving your furniture around. If any of your floors are uneven, fix them. If there are any pets around you, be aware of where they are. Review your medicines with your doctor. Some medicines can make you feel dizzy. This can increase your chance of falling. Ask your doctor what other things that you can do to help prevent falls. This information is not intended to replace advice given to you by your health care provider. Make sure you discuss any questions you have with your health care provider. Document Released: 06/06/2009 Document Revised: 01/16/2016 Document Reviewed: 09/14/2014 Elsevier Interactive Patient Education  2017 [employer].

## 2021-07-12 ENCOUNTER — Encounter: Payer: Self-pay | Admitting: Family Medicine

## 2021-07-21 NOTE — Progress Notes (Signed)
I reviewed health advisor's note, was available for consultation, and agree with documentation and plan.  

## 2021-07-23 DIAGNOSIS — G8929 Other chronic pain: Secondary | ICD-10-CM | POA: Diagnosis not present

## 2021-07-23 DIAGNOSIS — M25669 Stiffness of unspecified knee, not elsewhere classified: Secondary | ICD-10-CM | POA: Diagnosis not present

## 2021-07-23 DIAGNOSIS — M6281 Muscle weakness (generalized): Secondary | ICD-10-CM | POA: Diagnosis not present

## 2021-07-23 DIAGNOSIS — M25562 Pain in left knee: Secondary | ICD-10-CM | POA: Diagnosis not present

## 2021-07-31 ENCOUNTER — Telehealth: Payer: Self-pay | Admitting: Family Medicine

## 2021-07-31 DIAGNOSIS — E7800 Pure hypercholesterolemia, unspecified: Secondary | ICD-10-CM

## 2021-07-31 NOTE — Telephone Encounter (Signed)
-----   Message from Ellamae Sia sent at 07/14/2021 11:11 AM EST ----- Regarding: Lab orders for  Friday, 12.9.22 Patient is scheduled for CPX labs, please order future labs, Thanks , Karna Christmas

## 2021-08-01 ENCOUNTER — Other Ambulatory Visit: Payer: Self-pay

## 2021-08-01 ENCOUNTER — Other Ambulatory Visit (INDEPENDENT_AMBULATORY_CARE_PROVIDER_SITE_OTHER): Payer: Medicare HMO

## 2021-08-01 DIAGNOSIS — E7800 Pure hypercholesterolemia, unspecified: Secondary | ICD-10-CM

## 2021-08-01 LAB — LIPID PANEL
Cholesterol: 124 mg/dL (ref 0–200)
HDL: 39.6 mg/dL (ref >39.00)
LDL Cholesterol: 66 mg/dL (ref 0–99)
NonHDL: 84.74
Total CHOL/HDL Ratio: 3
Triglycerides: 94 mg/dL (ref 0.0–149.0)
VLDL: 18.8 mg/dL (ref 0.0–40.0)

## 2021-08-01 LAB — COMPREHENSIVE METABOLIC PANEL
ALT: 15 U/L (ref 0–53)
AST: 18 U/L (ref 0–37)
Albumin: 4.3 g/dL (ref 3.5–5.2)
Alkaline Phosphatase: 62 U/L (ref 39–117)
BUN: 14 mg/dL (ref 6–23)
CO2: 26 mEq/L (ref 19–32)
Calcium: 9.4 mg/dL (ref 8.4–10.5)
Chloride: 107 mEq/L (ref 96–112)
Creatinine, Ser: 1.27 mg/dL (ref 0.40–1.50)
GFR: 53.41 mL/min — ABNORMAL LOW (ref >60.00)
Glucose, Bld: 86 mg/dL (ref 70–99)
Potassium: 4.2 mEq/L (ref 3.5–5.1)
Sodium: 140 mEq/L (ref 135–145)
Total Bilirubin: 0.8 mg/dL (ref 0.2–1.2)
Total Protein: 6.8 g/dL (ref 6.0–8.3)

## 2021-08-01 NOTE — Progress Notes (Signed)
No critical labs need to be addressed urgently. We will discuss labs in detail at upcoming office visit.   

## 2021-08-06 DIAGNOSIS — M722 Plantar fascial fibromatosis: Secondary | ICD-10-CM | POA: Diagnosis not present

## 2021-08-06 DIAGNOSIS — M25562 Pain in left knee: Secondary | ICD-10-CM | POA: Diagnosis not present

## 2021-08-06 DIAGNOSIS — M6281 Muscle weakness (generalized): Secondary | ICD-10-CM | POA: Diagnosis not present

## 2021-08-06 DIAGNOSIS — G8929 Other chronic pain: Secondary | ICD-10-CM | POA: Diagnosis not present

## 2021-08-06 DIAGNOSIS — M25669 Stiffness of unspecified knee, not elsewhere classified: Secondary | ICD-10-CM | POA: Diagnosis not present

## 2021-08-08 ENCOUNTER — Other Ambulatory Visit: Payer: Self-pay

## 2021-08-08 ENCOUNTER — Ambulatory Visit (INDEPENDENT_AMBULATORY_CARE_PROVIDER_SITE_OTHER): Payer: Medicare HMO | Admitting: Family Medicine

## 2021-08-08 VITALS — BP 128/66 | HR 88 | Temp 98.2°F | Resp 12 | Ht 69.0 in | Wt 227.5 lb

## 2021-08-08 DIAGNOSIS — Z0000 Encounter for general adult medical examination without abnormal findings: Secondary | ICD-10-CM | POA: Diagnosis not present

## 2021-08-08 DIAGNOSIS — E7800 Pure hypercholesterolemia, unspecified: Secondary | ICD-10-CM

## 2021-08-08 DIAGNOSIS — I10 Essential (primary) hypertension: Secondary | ICD-10-CM

## 2021-08-08 DIAGNOSIS — Z23 Encounter for immunization: Secondary | ICD-10-CM | POA: Diagnosis not present

## 2021-08-08 DIAGNOSIS — G3184 Mild cognitive impairment, so stated: Secondary | ICD-10-CM

## 2021-08-08 DIAGNOSIS — K1370 Unspecified lesions of oral mucosa: Secondary | ICD-10-CM | POA: Insufficient documentation

## 2021-08-08 NOTE — Assessment & Plan Note (Signed)
Stable per patient.

## 2021-08-08 NOTE — Progress Notes (Signed)
Patient ID: Jack Norris, male    DOB: 04/01/1941, 80 y.o.   MRN: [id number]  This visit was conducted in person.  BP 128/66    Pulse 88    Temp 98.2 F (36.8 C)    Resp 12    Ht 5\' 9"  (1.753 m)    Wt 227 lb 8 oz (103.2 kg)    SpO2 98%    BMI 33.60 kg/m    CC:  Chief Complaint  Patient presents with   Annual Exam    Part 2. AWV done on 07/11/21    Subjective:   HPI: Jack Norris is a 80 y.o. male presenting on 08/08/2021 for Annual Exam (Part 2. AWV done on 07/11/21)   The patient presents for complete physical and review of chronic health problems. He/She also has the following acute concerns today:  The patient saw a LPN or RN for medicare wellness visit.  Prevention and wellness was reviewed in detail. Note reviewed and important notes copied below.    Seen for SOB and cough in 05/2021.. treated with antibiotics and prednisone.  Hypertension:  Good control on lisinopril 40 mg daily , amlodipine 5 mg daily BP Readings from Last 3 Encounters:  08/08/21 128/66  06/03/21 122/66  06/02/21 125/80  Using medication without problems or lightheadedness:  none Chest pain with exertion: none Edema:none Short of breath:none Average home BPs: Other issues:  Elevated Cholesterol:  At goal LDL < 70 on rosuvastatin and fenofibrate Lab Results  Component Value Date   CHOL 124 08/01/2021   HDL 39.60 08/01/2021   LDLCALC 66 08/01/2021   LDLDIRECT 107.6 12/14/2011   TRIG 94.0 08/01/2021   CHOLHDL 3 08/01/2021   Using medications without problems: Muscle aches:  Diet compliance: moderate Exercise: walking .. doing PT for knee Other complaints:  Lung nodules < 6 cm: noted 2019, 11/2018 3 month follow up stable...repeat recommended in 18-24 months... plan 11/2020   Mild cognitive impairment: Reviewed OV note from neurology 06/2019, 10/10/2019  Dr. Krista Blue MRI of the brain with thin cuts through internal acoustic canal, with without contrast showed no significant  abnormality             Laboratory showed normal TSH, B12, no treatable etiology identified Recommended exercise no other treatment at this time.   he reports that it is stable overall.  Mother with squamous cell Ca on roof of mouth.. remote smoking history.  Dentist noted lesion on roof of mouth.  Relevant past medical, surgical, family and social history reviewed and updated as indicated. Interim medical history since our last visit reviewed. Allergies and medications reviewed and updated. Outpatient Medications Prior to Visit  Medication Sig Dispense Refill   albuterol (VENTOLIN HFA) 108 (90 Base) MCG/ACT inhaler Inhale 2 puffs into the lungs every 6 (six) hours as needed for wheezing or shortness of breath. 8 g 2   aspirin 81 MG tablet Take 81 mg by mouth daily.     B-D 3CC LUER-LOK SYR 18GX1-1/2 18G X 1-1/2" 3 ML MISC USE ONCE A WEEK AS DIRECTED  2   fenofibrate micronized (LOFIBRA) 67 MG capsule TAKE 1 CAPSULE(67 MG) BY MOUTH DAILY BEFORE BREAKFAST 90 capsule 3   lisinopril (ZESTRIL) 40 MG tablet Take 1 tablet (40 mg total) by mouth daily. 90 tablet 3   omeprazole (PRILOSEC) 20 MG capsule Take 20 mg by mouth daily. Per patient OTC     psyllium (METAMUCIL) 58.6 % powder Take 1 packet by mouth  daily.     rosuvastatin (CRESTOR) 5 MG tablet TAKE 1 TABLET(5 MG) BY MOUTH DAILY 90 tablet 2   testosterone cypionate (DEPOTESTOSTERONE CYPIONATE) 200 MG/ML injection Inject 0.5 mLs (100 mg total) into the muscle every 7 (seven) days. 10 mL 0   triamcinolone (KENALOG) 0.025 % cream Apply 1 application topically as needed.     amLODipine (NORVASC) 10 MG tablet Take 1 tablet (10 mg total) by mouth daily. 180 tablet 3   azithromycin (ZITHROMAX) 250 MG tablet 2 tab po x 1 day then 1 tab po daily 6 tablet 0   doxycycline (VIBRA-TABS) 100 MG tablet Take 1 tablet (100 mg total) by mouth 2 (two) times daily. 20 tablet 0   predniSONE (DELTASONE) 20 MG tablet 3 tabs by mouth daily x 3 days, then 2 tabs by  mouth daily x 2 days then 1 tab by mouth daily x 2 days 15 tablet 0   No facility-administered medications prior to visit.     Per HPI unless specifically indicated in ROS section below Review of Systems  Constitutional:  Negative for fatigue and fever.  HENT:  Negative for ear pain.   Eyes:  Negative for pain.  Respiratory:  Negative for cough and shortness of breath.   Cardiovascular:  Negative for chest pain, palpitations and leg swelling.  Gastrointestinal:  Negative for abdominal pain.  Genitourinary:  Negative for dysuria.  Musculoskeletal:  Negative for arthralgias.  Neurological:  Negative for syncope, light-headedness and headaches.  Psychiatric/Behavioral:  Negative for dysphoric mood.   Objective:  BP 128/66    Pulse 88    Temp 98.2 F (36.8 C)    Resp 12    Ht 5\' 9"  (1.753 m)    Wt 227 lb 8 oz (103.2 kg)    SpO2 98%    BMI 33.60 kg/m   Wt Readings from Last 3 Encounters:  08/08/21 227 lb 8 oz (103.2 kg)  07/11/21 224 lb (101.6 kg)  06/03/21 224 lb (101.6 kg)      Physical Exam Constitutional:      Appearance: He is well-developed. He is obese.  HENT:     Head: Normocephalic.     Comments: Small erythematous abrasion on roof of mouth, small poppy seed size lesion on right posterior palate. ? cyst    Right Ear: Hearing normal.     Left Ear: Hearing normal.     Nose: Nose normal.  Neck:     Thyroid: No thyroid mass or thyromegaly.     Vascular: No carotid bruit.     Trachea: Trachea normal.  Cardiovascular:     Rate and Rhythm: Normal rate and regular rhythm.     Pulses: Normal pulses.     Heart sounds: Heart sounds not distant. No murmur heard.   No friction rub. No gallop.     Comments: No peripheral edema Pulmonary:     Effort: Pulmonary effort is normal. No respiratory distress.     Breath sounds: Normal breath sounds.  Skin:    General: Skin is warm and dry.     Findings: No rash.  Psychiatric:        Speech: Speech normal.        Behavior: Behavior  normal.        Thought Content: Thought content normal.      Results for orders placed or performed in visit on 08/01/21  Comprehensive metabolic panel  Result Value Ref Range   Sodium 140 135 - 145 mEq/L  Potassium 4.2 3.5 - 5.1 mEq/L   Chloride 107 96 - 112 mEq/L   CO2 26 19 - 32 mEq/L   Glucose, Bld 86 70 - 99 mg/dL   BUN 14 6 - 23 mg/dL   Creatinine, Ser 1.27 0.40 - 1.50 mg/dL   Total Bilirubin 0.8 0.2 - 1.2 mg/dL   Alkaline Phosphatase 62 39 - 117 U/L   AST 18 0 - 37 U/L   ALT 15 0 - 53 U/L   Total Protein 6.8 6.0 - 8.3 g/dL   Albumin 4.3 3.5 - 5.2 g/dL   GFR 53.41 (L) >60.00 mL/min   Calcium 9.4 8.4 - 10.5 mg/dL  Lipid panel  Result Value Ref Range   Cholesterol 124 0 - 200 mg/dL   Triglycerides 94.0 0.0 - 149.0 mg/dL   HDL 39.60 >39.00 mg/dL   VLDL 18.8 0.0 - 40.0 mg/dL   LDL Cholesterol 66 0 - 99 mg/dL   Total CHOL/HDL Ratio 3    NonHDL 84.74     This visit occurred during the SARS-CoV-2 public health emergency.  Safety protocols were in place, including screening questions prior to the visit, additional usage of staff PPE, and extensive cleaning of exam room while observing appropriate contact time as indicated for disinfecting solutions.   COVID 19 screen:  No recent travel or known exposure to COVID19 The patient denies respiratory symptoms of COVID 19 at this time. The importance of social distancing was discussed today.   Assessment and Plan The patient's preventative maintenance and recommended screening tests for an annual wellness exam were reviewed in full today. Brought up to date unless services declined.  Vaccines: Uptodate with flu, COVID x 3 , PNA,  Consider shingrix Colon: 05/2013, [facility] nml, no further indicated.   Prostate: per uro. Stable PSA per  Dr. Diona Fanti . Former smoker, remotely.    Hep C plan next lab check.  Problem List Items Addressed This Visit     Benign essential hypertension (Chronic)    Stable, chronic.  Continue current  medication.   lisinopril 40 mg daily , amlodipine 5 mg daily      HYPERCHOLESTEROLEMIA (Chronic)    Stable, chronic.  Continue current medication.    At goal LDL < 70 on rosuvastatin and fenofibrate      Mild cognitive impairment with memory loss     Stable per patient.      Oral lesion    No concerning lesion seen today... has follow up routinely with dentist as well as his ENT.      Routine general medical examination at a health care facility   Other Visit Diagnoses     Need for immunization against influenza    -  Primary   Relevant Orders   Flu Vaccine QUAD High Dose(Fluad) (Completed)          Medicare Wellness Visit with nurse, followed by  40 minute CPE with Dr. Rickard Patience, MD

## 2021-08-08 NOTE — Patient Instructions (Addendum)
Call if you decide to move forward with scheduling CT lung for following up on lung nodules after the holidays.  Work on [employer] and regular exercise.

## 2021-08-08 NOTE — Assessment & Plan Note (Signed)
Stable, chronic.  Continue current medication.      At goal LDL < 70 on rosuvastatin and fenofibrate 

## 2021-08-08 NOTE — Assessment & Plan Note (Signed)
Stable, chronic.  Continue current medication.   lisinopril 40 mg daily , amlodipine 5 mg daily  

## 2021-08-08 NOTE — Assessment & Plan Note (Signed)
No concerning lesion seen today... has follow up routinely with dentist as well as his ENT.

## 2021-08-08 NOTE — Assessment & Plan Note (Signed)
He has far remote  Smoking history > 25 years ago.  He will consider 2nd surveillance CT after the holidays for 2 year repeat for stability.

## 2021-09-09 ENCOUNTER — Encounter: Payer: Self-pay | Admitting: Family Medicine

## 2021-09-09 DIAGNOSIS — G4730 Sleep apnea, unspecified: Secondary | ICD-10-CM

## 2021-09-10 ENCOUNTER — Other Ambulatory Visit: Payer: Self-pay | Admitting: Interventional Cardiology

## 2021-09-16 ENCOUNTER — Encounter: Payer: Self-pay | Admitting: Family Medicine

## 2021-09-17 ENCOUNTER — Encounter: Payer: Self-pay | Admitting: Pulmonary Disease

## 2021-09-17 NOTE — Telephone Encounter (Signed)
Dr. Elsworth Soho, please advise on pt's email. He is scheduled to see you on 2/22 for sleep consult. Thank you!

## 2021-09-24 ENCOUNTER — Other Ambulatory Visit: Payer: Self-pay | Admitting: Interventional Cardiology

## 2021-09-25 ENCOUNTER — Telehealth: Payer: Self-pay | Admitting: Family Medicine

## 2021-09-25 NOTE — Chronic Care Management (AMB) (Signed)
°  Chronic Care Management   Note  09/25/2021 Name: AKHILESH SASSONE MRN: [id number] DOB: August 27, 1940  [name] is a 81 y.o. year old male who is a primary care patient of Bedsole, Amy E, MD. I reached out to [name] by phone today in response to a referral sent by Mr. WARIS RODGER PCP, Jinny Sanders, MD.   Mr. Sivley was given information about Chronic Care Management services today including:  CCM service includes personalized support from designated clinical staff supervised by his physician, including individualized plan of care and coordination with other care providers 24/7 contact phone numbers for assistance for urgent and routine care needs. Service will only be billed when office clinical staff spend 20 minutes or more in a month to coordinate care. Only one practitioner may furnish and bill the service in a calendar month. The patient may stop CCM services at any time (effective at the end of the month) by phone call to the office staff.   Patient agreed to services and verbal consent obtained.   Follow up plan:   Tatjana Secretary/administrator

## 2021-10-06 ENCOUNTER — Other Ambulatory Visit: Payer: Self-pay | Admitting: Interventional Cardiology

## 2021-10-06 DIAGNOSIS — Z79899 Other long term (current) drug therapy: Secondary | ICD-10-CM

## 2021-10-06 DIAGNOSIS — I10 Essential (primary) hypertension: Secondary | ICD-10-CM

## 2021-10-06 MED ORDER — LISINOPRIL 40 MG PO TABS: ORAL_TABLET | ORAL | 1 refills | Status: DC

## 2021-10-06 NOTE — Addendum Note (Signed)
Addended by: Carter Kitten D on: 10/06/2021 12:28 PM   Modules accepted: Orders

## 2021-10-13 ENCOUNTER — Telehealth: Payer: Self-pay

## 2021-10-13 NOTE — Chronic Care Management (AMB) (Signed)
Chronic Care Management Pharmacy Assistant   Name: Jack Norris  MRN: [id number] DOB: 14-May-1941  Jack Norris is an 81 y.o. year old male who presents for his initial CCM visit with the clinical pharmacist.  Reason for Encounter: Initial Questions   Conditions to be addressed/monitored: CAD, HTN, and HLD   Recent office visits:  08/08/21-PCP-Amy Bedsole,MD-Patient presented for AWV.Discussed screenings, vaccines, possible Lung CT scan in the future, work on diet and exercise, no medication changes  06/03/21-PCP-Amy Bedsole,MD-Patient presented for acute cough.Covid home test-negative.Start Azithromycin 250mg  2 tablets daily for 1 day then 1 tablet daily, start prednisone 20mg - 3 tablets daily for 3 days then 1 tablet daily for 2 days  Recent consult visits:  08/06/21-Orthopedic Surgery, Hessie Knows- Left knee pain-no data found  07/07/21-Dermatology-Benitez-Graham,Ana- basal cell CA- no data found 06/04/21-Orthopedic Surgery-Joshua Landau- left knee pain- no data found 06/02/21-[facility]-Wylder Fondaw,PA-Patient presented for chest pain.EKG,labs ordered(creatinine,GFR abnormal)Chest xray-discharged to home.No medication changes 05/22/21-Audiology-Sherrie Pelkey-hearing test- no data found 04/29/21-Ophthalmology-Gary Rankin,MD-Patient presented for retina evaluation both eyes.No medication changes   Hospital visits:  None in previous 6 months  Medications: Outpatient Encounter Medications as of 10/13/2021  Medication Sig   albuterol (VENTOLIN HFA) 108 (90 Base) MCG/ACT inhaler Inhale 2 puffs into the lungs every 6 (six) hours as needed for wheezing or shortness of breath.   amLODipine (NORVASC) 10 MG tablet TAKE 1 TABLET BY MOUTH EVERY DAY   aspirin 81 MG tablet Take 81 mg by mouth daily.   B-D 3CC LUER-LOK SYR 18GX1-1/2 18G X 1-1/2" 3 ML MISC USE ONCE A WEEK AS DIRECTED   fenofibrate micronized (LOFIBRA) 67 MG capsule TAKE 1 CAPSULE(67 MG) BY MOUTH DAILY BEFORE  BREAKFAST   lisinopril (ZESTRIL) 40 MG tablet TAKE 1 TABLET(40 MG) BY MOUTH DAILY   omeprazole (PRILOSEC) 20 MG capsule Take 20 mg by mouth daily. Per patient OTC   psyllium (METAMUCIL) 58.6 % powder Take 1 packet by mouth daily.   rosuvastatin (CRESTOR) 5 MG tablet TAKE 1 TABLET(5 MG) BY MOUTH DAILY   testosterone cypionate (DEPOTESTOSTERONE CYPIONATE) 200 MG/ML injection Inject 0.5 mLs (100 mg total) into the muscle every 7 (seven) days.   triamcinolone (KENALOG) 0.025 % cream Apply 1 application topically as needed.   No facility-administered encounter medications on file as of 10/13/2021.    No results found for: HGBA1C, MICROALBUR   BP Readings from Last 3 Encounters:  08/08/21 128/66  06/03/21 122/66  06/02/21 125/80    Patient contacted to review initial questions prior to visit with Charlene Brooke. Call patient on cell [phone]  Have you seen any other providers since your last visit with PCP? No  Any changes in your medications or health? Yes  Not sleeping well   Any side effects from any medications? No  Do you have an symptoms or problems not managed by your medications? No  Any concerns about your health right now? Yes  Not sleeping well, patient has new appointment with Pulmonology this weed  Has your provider asked that you check blood pressure, blood sugar, or follow special diet at home? No The patient reports he does have BP monitor. He will try to get some readings for  the phone call appointment  Do you get any type of exercise on a regular basis? The patient spent 6 months with personal trainer and gained strength, but did not loose weight.  Can you think of a goal you would like to reach for your health? Yes  The patient wants sleep to improve  Do you have any problems getting your medications? No  the patient uses  [facility]   Is there anything that you would like to discuss during the appointment? No   Spoke with patient and reminded  them to have all medications, supplements and any blood glucose and blood pressure readings available for review with pharmacist, at their telephone visit on 10/16/21 at 1:30pm.   Star Rating Drugs:  Medication:  Last Fill: [facility] Lisinopril 40mg  07/03/21 90 Rosuvastatin 5mg  08/22/21 90   Care Gaps: Annual wellness visit in last year? Yes Most Recent BP reading:128/66  88-P  08/08/21   Marjo Bicker  CPP notified  Avel Sensor, [place] Assistant [phone]  Total time spent for month CPA: 40 min

## 2021-10-15 ENCOUNTER — Ambulatory Visit (INDEPENDENT_AMBULATORY_CARE_PROVIDER_SITE_OTHER): Payer: Medicare HMO | Admitting: Pulmonary Disease

## 2021-10-15 ENCOUNTER — Encounter: Payer: Self-pay | Admitting: Pulmonary Disease

## 2021-10-15 ENCOUNTER — Other Ambulatory Visit: Payer: Self-pay

## 2021-10-15 VITALS — BP 110/68 | HR 87 | Temp 98.0°F | Ht 69.0 in | Wt 226.8 lb

## 2021-10-15 DIAGNOSIS — R918 Other nonspecific abnormal finding of lung field: Secondary | ICD-10-CM | POA: Diagnosis not present

## 2021-10-15 DIAGNOSIS — Z9989 Dependence on other enabling machines and devices: Secondary | ICD-10-CM | POA: Diagnosis not present

## 2021-10-15 DIAGNOSIS — G4733 Obstructive sleep apnea (adult) (pediatric): Secondary | ICD-10-CM

## 2021-10-15 MED ORDER — AZITHROMYCIN 250 MG PO TABS: 250.0000 mg | ORAL_TABLET | Freq: Every day | ORAL | 0 refills | Status: DC

## 2021-10-15 NOTE — Progress Notes (Signed)
Subjective:    Patient ID: Jack Norris, male    DOB: 12/14/1940, 81 y.o.   MRN: [id number]  HPI  Chief Complaint  Patient presents with   Consult    Daytime sleepiness DME: [facility] already on cpap   81 year old man from bedside presents with his wife Jack Norris to establish care for OSA and asthmatic bronchitis and pulmonary nodules  He smoked less than 10 pack years and quit in 1969. OSA was diagnosed by an overnight sleep study in 07/2018 ordered by his ENT physician.  He reported loud snoring and wife had witnessed apneas.  Only mild OSA was noted but he was placed on CPAP after titration study at 6 cm and settled down with nasal pillows, DME is [facility].  Wife reports improvement in his snoring but he has not noted any increase in energy levels.  His ophthalmologist noted that his retina had improved with a few months of starting CPAP. Wife Jack Norris reports that occasionally he will have "puffing" episodes while lying on his back about twice a week. Bedtime is between 10 PM and midnight, sleep latency is minimal, he sleeps on his side with 1-2 pillows and reports 1-2 nocturnal awakenings until he is out of bed at 7 AM feeling rested without dryness of mouth or headaches. He remained sleepy during the day. Epworth sleepiness score is 13 and wife reports long naps, sleepiness while sitting and reading, watching TV or lying down to rest in the afternoons.  He gets frequent head colds and this is quick to go into his chest.  He occasionally needs a steroid taper to get over this.  He recently had a head cold, he reports purulent nasal discharge, occasional wheezing.  Lung nodules were noted on a prior CT chest 07/2018 and on a follow-up CT chest 11/2018, 2 right-sided lung nodules were noted to be resolving and the left nodule was noted to be subpleural left  Family history of lung cancer in his dad and mother had breast and renal cancer.   Significant tests/ events  reviewed  07/2018-weight 206 pounds, TST 3 7 0 minutes, AHI 5.1/hour lowest desaturation 84%  07/2018 CPAP titration 7 cm  07/2018 CT coronaries-no lung nodules 11/2018 CT chest-decreasing lung nodules         Past Medical History:  Diagnosis Date   Asthma, mild    as child-only now if has URI   BPH (benign prostatic hypertrophy)    BPPV (benign paroxysmal positional vertigo)    Diverticulosis    Eczema    Fatty liver disease, nonalcoholic    Frequent nosebleeds    History of   GERD (gastroesophageal reflux disease)    occ uses TUMS as needed   Glaucoma    History of bronchitis    History of colon polyps    History of diverticulitis    History of kidney stones    Hyperlipemia    Hypertension    Influenza A 11/2017   Memory changes    OA (osteoarthritis)    Obesity    Overactive bladder 2020   PVCs (premature ventricular contractions)    Renal calculi right    AND LEFT X1 NON-OBSTRUCTIVE   Right sided sciatica    Bilateral buttock and thighs   Simple renal cyst right interpoler, simpler in nature , asymptomatic  per urologist note (dr dahlstedt)  12-26-2011   BENIGN, has been drained   Testosterone deficiency    Wears glasses    Wears hearing aid  Past Surgical History:  Procedure Laterality Date   APPENDECTOMY     bone spur removal     BILATERAL GREAT TOE   COLONOSCOPY     CYSTO/ RIGHT RETROGRADE PYELOGRAM/ RIGHT URETER DILATATION/ RIGHT URETEROSCOPY/ LASER LITHOTRIPSY RIGHT RENAL PELVIS AND LOWER POLE CALCULI/ PLACEMENT STENT  01-11-2012  DR DAHLSTEDT   RIGHT RENAL PELVIS AND LOWER POLE CALCULI   CYSTOSCOPY W/ RETROGRADES  06/02/2012   Procedure: CYSTOSCOPY WITH RETROGRADE PYELOGRAM;  Surgeon: Franchot Gallo, MD;  Location: [facility];  Service: Urology;  Laterality: Right;   CYSTOSCOPY WITH URETEROSCOPY  06/02/2012   Procedure: CYSTOSCOPY WITH URETEROSCOPY;  Surgeon: Franchot Gallo, MD;  Location: [facility];   Service: Urology;  Laterality: Right;  90 MIN ALSO EXTRACTION OF CALCULI    EYE SURGERY Bilateral 2020   LITHOTRIPSY  APPROX.  1998   ESWL  left kidney x 2   ROTATOR CUFF REPAIR  2012   LEFT SHOULDER   SHOULDER ARTHROSCOPY WITH ROTATOR CUFF REPAIR AND SUBACROMIAL DECOMPRESSION  09/12/2012   Procedure: SHOULDER ARTHROSCOPY WITH ROTATOR CUFF REPAIR AND SUBACROMIAL DECOMPRESSION;  Surgeon: Nita Sells, MD;  Location: [place];  Service: Orthopedics;  Laterality: Right;  see above and biceps tendonotomy   SKIN LESION EXCISION Right 07/07/2021   right side of face near right ear   TRANSURETHRAL RESECTION OF BLADDER NECK N/A 04/08/2018   Procedure: TRANSURETHRAL RESECTION OF BLADDER NECK CONTRACTURE;  Surgeon: Franchot Gallo, MD;  Location: [facility];  Service: Urology;  Laterality: N/A;   TRANSURETHRAL RESECTION OF PROSTATE  11/29/2009   BPH   VASECTOMY  1970'S   GEN. ANES.    Allergies  Allergen Reactions   Paroxetine     REACTION: Had mild personality change on this medication and doesn't want to take this again.   Morphine And Related     Causes bladder problems   Ciprofloxacin Other (See Comments)    ABDOMINAL PAIN/ sob   Flagyl [Metronidazole Hcl] Other (See Comments)    ABDOMINAL PAIN/ sob   Social History   Socioeconomic History   Marital status: Married    Spouse name: Not on file   Number of children: 3   Years of education: college   Highest education level: Bachelor's degree (e.g., BA, AB, BS)  Occupational History   Occupation: QUOTATION Lobbyist: [employer]  Tobacco Use   Smoking status: Former    Years: 8.00    Types: Cigarettes    Quit date: 01/05/1967    Years since quitting: 54.8   Smokeless tobacco: Never   Tobacco comments:    QUIT AGE 63  Vaping Use   Vaping Use: Never used  Substance and Sexual Activity   Alcohol use: Yes    Alcohol/week: 0.0 standard drinks    Comment:  OCCASIONAL   Drug use: No   Sexual activity: Yes  Other Topics Concern   Not on file  Social History Narrative   regular exercise    Full code, (Reviewed 2014)      Lives at home with his wife.   Right-handed.   Caffeine use: 4 cups per day.   Social Determinants of Health   Financial Resource Strain: Low Risk    Difficulty of Paying Living Expenses: Not hard at all  Food Insecurity: No Food Insecurity   Worried About Charity fundraiser in the Last Year: Never true   [place] in the Last  Year: Never true  Transportation Needs: No Transportation Needs   Lack of Transportation (Medical): No   Lack of Transportation (Non-Medical): No  Physical Activity: Inactive   Days of Exercise per Week: 0 days   Minutes of Exercise per Session: 0 min  Stress: No Stress Concern Present   Feeling of Stress : Not at all  Social Connections: Socially Integrated   Frequency of Communication with Friends and Family: More than three times a week   Frequency of Social Gatherings with Friends and Family: Twice a week   Attends Religious Services: More than 4 times per year   Active Member of [employer] or Organizations: Yes   Attends Archivist Meetings: Never   Marital Status: Married  Human resources officer Violence: Not At Risk   Fear of Current or Ex-Partner: No   Emotionally Abused: No   Physically Abused: No   Sexually Abused: No    Family History  Problem Relation Age of Onset   Cancer Mother        breast and kidney    Squamous cell carcinoma Mother    Cancer Father        lung   Heart disease Father       Review of Systems Cough productive of clear sputum Acid heartburn Weight gain 20 pounds Sore throat, nasal congestion and sneezing Joint stiffness  Constitutional: negative for anorexia, fevers and sweats  Eyes: negative for irritation, redness and visual disturbance  Ears, nose, mouth, throat, and face: negative for earaches, epistaxis, nasal congestion and  sore throat  Respiratory: negative for sputum and wheezing  Cardiovascular: negative for chest pain, lower extremity edema, orthopnea, palpitations and syncope  Gastrointestinal: negative for abdominal pain, constipation, diarrhea, melena, nausea and vomiting  Genitourinary:negative for dysuria, frequency and hematuria  Hematologic/lymphatic: negative for bleeding, easy bruising and lymphadenopathy  Musculoskeletal:negative for arthralgias, muscle weakness  Neurological: negative for coordination problems, gait problems, headaches and weakness  Endocrine: negative for diabetic symptoms including polydipsia, polyuria and weight loss     Objective:   Physical Exam  Gen. Pleasant, obese, in no distress, normal affect ENT - no pallor,icterus, no post nasal drip, class 2-3 airway Neck: No JVD, no thyromegaly, no carotid bruits Lungs: no use of accessory muscles, no dullness to percussion, decreased without rales or rhonchi  Cardiovascular: Rhythm regular, heart sounds  normal, no murmurs or gallops, no peripheral edema Abdomen: soft and non-tender, no hepatosplenomegaly, BS normal. Musculoskeletal: No deformities, no cyanosis or clubbing Neuro:  alert, non focal, no tremors       Assessment & Plan:    Acute bronchitis/sinusitis -we will treat him with a Z-Pak given his prior history of developing asthmatic bronchitis

## 2021-10-15 NOTE — Patient Instructions (Signed)
°  X Increase CPAP to 7 cm Try to increase sleep time on CPAP to 6h  X CT chest wo con to follow on nodules  X Rx for Zpak

## 2021-10-15 NOTE — Assessment & Plan Note (Signed)
Likely benign, will do 2-year follow-up CT scan given his family history of lung cancer and if unchanged or decreased, can stop following

## 2021-10-15 NOTE — Assessment & Plan Note (Signed)
CPAP download was reviewed which shows reasonable compliance about 4 hours every night on CPAP of 6 cm and good control of events with no residual events and minimal leak.  Due to wife's description of residual snoring, we will increase EPAP to 7 cm.  He has gained 20 pounds since his original study and it is likely that even though he only had very mild OSA on his initial study that this has worsened over the past few years, hence I asked him to continue wearing CPAP. He has tolerated this reasonably well so I do not think we have to explore alternative therapies although dental appliance can be an option.

## 2021-10-16 ENCOUNTER — Ambulatory Visit (INDEPENDENT_AMBULATORY_CARE_PROVIDER_SITE_OTHER): Payer: Medicare HMO | Admitting: Pharmacist

## 2021-10-16 DIAGNOSIS — I2510 Atherosclerotic heart disease of native coronary artery without angina pectoris: Secondary | ICD-10-CM

## 2021-10-16 DIAGNOSIS — K219 Gastro-esophageal reflux disease without esophagitis: Secondary | ICD-10-CM

## 2021-10-16 DIAGNOSIS — J4520 Mild intermittent asthma, uncomplicated: Secondary | ICD-10-CM

## 2021-10-16 DIAGNOSIS — E7800 Pure hypercholesterolemia, unspecified: Secondary | ICD-10-CM

## 2021-10-16 DIAGNOSIS — I10 Essential (primary) hypertension: Secondary | ICD-10-CM

## 2021-10-16 NOTE — Patient Instructions (Signed)
Visit Information  Phone number for Pharmacist: [phone]  Thank you for meeting with me to discuss your medications! I look forward to working with you to achieve your health care goals. Below is a summary of what we talked about during the visit:   Goals Addressed             This Visit's Progress    Manage My Medicine       Timeframe:  Long-Range Goal Priority:  Medium Start Date:    10/16/21                         Expected End Date:     10/16/22                  Follow Up Date Feb 2024   - call for medicine refill 2 or 3 days before it runs out - call if I am sick and can't take my medicine - keep a list of all the medicines I take; vitamins and herbals too    Why is this important?   These steps will help you keep on track with your medicines.   Notes:         Care Plan : [place]  Updates made by Charlton Haws, RPH since 10/16/2021 12:00 AM     Problem: Hypertension, Hyperlipidemia, Coronary Artery Disease, GERD, and Asthma   Priority: High     Long-Range Goal: Disease mgmt   Start Date: 10/16/2021  Expected End Date: 10/16/2022  This Visit's Progress: On track  Priority: High  Note:   Current Barriers:  Unable to independently monitor therapeutic efficacy  Pharmacist Clinical Goal(s):  Patient will achieve adherence to monitoring guidelines and medication adherence to achieve therapeutic efficacy through collaboration with PharmD and provider.   Interventions: 1:1 collaboration with Jinny Sanders, MD regarding development and update of comprehensive plan of care as evidenced by provider attestation and co-signature Inter-disciplinary care team collaboration (see longitudinal plan of care) Comprehensive medication review performed; medication list updated in electronic medical record  Hypertension (BP goal <130/80) -Controlled - BP at goal in office and at home; pt endorses compliance with medications; he has OSA and wears CPAP;  reports occasional lightheadedness upon standing but resolves quickly -Current home BP readings: 127/70, 132/80, 112/72 -Current treatment: Amlodipine 10 mg daily PM - Appropriate, Effective, Safe, Accessible Lisinopril 40 mg daily AM -Appropriate, Effective, Safe, Accessible -Medications previously tried: olmesartan, metoprolol -Educated on BP goals and benefits of medications for prevention of heart attack, stroke and kidney damage; Importance of home blood pressure monitoring; -Counseled to monitor BP at home weekly -Recommended to continue current medication  Hyperlipidemia / CAD: (LDL goal < 70) -Controlled - LDL is at goal; Trig is at goal; pt endorses compliance with medications -Follows with Dr Irish Lack; Coronary CT showed aortic atherosclerosis, 75th percentile; per patient Trig were > 1000 20+ years ago -Current treatment: Rosuvastatin 5 mg daily PM -Appropriate, Effective, Safe, Accessible Fenofibrate (Lofibra) 67 mg daily AM -Appropriate, Effective, Safe, Accessible Aspirin EC 81 mg daily AM -Appropriate, Effective, Safe, Accessible -Medications previously tried: gemfibrozil (2008-2020), niacin -Current exercise habits: walking, had personal trainer last year -Educated on Cholesterol goals; Benefits of statin for ASCVD risk reduction; -Recommended to continue current medication  Asthma (Goal: control symptoms and prevent exacerbations) -Controlled - uses albuterol infrequently, usually just with respiratory infections -Current treatment  Albuterol HFA prn -Appropriate, Effective, Safe, Accessible -Frequency of rescue inhaler  use: rare use -Counseled on Proper inhaler technique; -Recommended to continue current medication  GERD (Goal: manage symptoms) -Controlled - pt reports no breakthrough symptoms -Current treatment  Omeprazole 20 mg daily (OTC) AM -Appropriate, Effective, Safe, Accessible -Medications previously tried: n/a  -Recommended to continue current  medication  Health Maintenance -Vaccine gaps: Shingrix, covid booster -Testosterone prescribed by urologist, pt has been on this a few years -Triamcinolone for eczema per patient -Current therapy:  Metamucil powder Testosterone injections 200 mg/mL - 100 mg q7 days Triamcinolone 0.025% cream -Patient is satisfied with current therapy and denies issues -Recommended to continue current medication  Patient Goals/Self-Care Activities Patient will:  - take medications as prescribed as evidenced by patient report and record review focus on medication adherence by routine check blood pressure weekly, document, and provide at future appointments      Mr. Kriesel was given information about Chronic Care Management services today including:  CCM service includes personalized support from designated clinical staff supervised by his physician, including individualized plan of care and coordination with other care providers 24/7 contact phone numbers for assistance for urgent and routine care needs. Standard insurance, coinsurance, copays and deductibles apply for chronic care management only during months in which we provide at least 20 minutes of these services. Most insurances cover these services at 100%, however patients may be responsible for any copay, coinsurance and/or deductible if applicable. This service may help you avoid the need for more expensive face-to-face services. Only one practitioner may furnish and bill the service in a calendar month. The patient may stop CCM services at any time (effective at the end of the month) by phone call to the office staff.  Patient agreed to services and verbal consent obtained.   Patient verbalizes understanding of instructions and care plan provided today and agrees to view in [place]. Active MyChart status confirmed with patient.   Telephone follow up appointment with pharmacy team member scheduled for:1 year  Charlene Brooke, PharmD,  [facility] Clinical Pharmacist [facility] [phone]

## 2021-10-16 NOTE — Progress Notes (Signed)
Chronic Care Management Pharmacy Note  10/16/2021 Name:  Jack Norris MRN:  [id number] DOB:  [birth year]  Summary: CCM Initial visit -Pt endorses compliance with medications as prescribed. BP and lipids at goal.  Recommendations/Changes made from today's visit: -No med changes  Plan: -[place] will call patient 6 months -Pharmacist follow up televisit scheduled for 1 year -PCP annual visit 07/2022   Subjective: Jack Norris is an 81 y.o. year old male who is a primary patient of Bedsole, Amy E, MD.  The CCM team was consulted for assistance with disease management and care coordination needs.    Engaged with patient by telephone for initial visit in response to provider referral for pharmacy case management and/or care coordination services.   Consent to Services:  The patient was given the following information about Chronic Care Management services today, agreed to services, and gave verbal consent: 1. CCM service includes personalized support from designated clinical staff supervised by the primary care provider, including individualized plan of care and coordination with other care providers 2. 24/7 contact phone numbers for assistance for urgent and routine care needs. 3. Service will only be billed when office clinical staff spend 20 minutes or more in a month to coordinate care. 4. Only one practitioner may furnish and bill the service in a calendar month. 5.The patient may stop CCM services at any time (effective at the end of the month) by phone call to the office staff. 6. The patient will be responsible for cost sharing (co-pay) of up to 20% of the service fee (after annual deductible is met). Patient agreed to services and consent obtained.  Patient Care Team: Jinny Sanders, MD as PCP - General Jettie Booze, MD as PCP - Cardiology (Cardiology) Clent Jacks, MD as Consulting Physician (Ophthalmology) Franchot Gallo, MD as Consulting  Physician (Urology) Ree Edman, MD as Consulting Physician (Dermatology) Charlton Haws, [facility] as Pharmacist (Pharmacist)  Patient lives at home with his wife.  Recent office visits: 08/08/21-PCP-Amy Bedsole,MD-Patient presented for AWV.Discussed screenings, vaccines, possible Lung CT scan in the future, work on diet and exercise, no medication changes  06/03/21-PCP-Amy Bedsole,MD-Patient presented for acute cough.Covid home test-negative.Start Azithromycin 243m 2 tablets daily for 1 day then 1 tablet daily, start prednisone 245m 3 tablets daily for 3 days then 1 tablet daily for 2 days  Recent consult visits: 10/15/21 Dr AlElsworth Sohopulmonary): f/u OSA, asthma. Adjusted CPAP setting d/t snoring. Acute bronchitis - tx Z-pak. Ordered CT for pulmonary nodules. 08/06/21-Orthopedic Surgery, MiHessie KnowsLeft knee pain-no data found  07/07/21-Dermatology-Benitez-Graham,Ana- basal cell CA- no data found 06/04/21-Orthopedic Surgery-Joshua Landau- left knee pain- no data found 05/22/21-Audiology-Sherrie Pelkey-hearing test- no data found 04/29/21-Ophthalmology-Gary Rankin,MD-Patient presented for retina evaluation both eyes.No medication changes  03/13/21 Dr VaIrish Lackcardiology): f/u CAD. No angina. No med changes.   Hospital visits: 06/02/21-[place] [facility] [place]-Patient presented for chest pain.EKG,labs ordered(creatinine,GFR abnormal)Chest xray-discharged to home.No medication changes   Objective:  Lab Results  Component Value Date   CREATININE 1.27 08/01/2021   BUN 14 08/01/2021   GFR 53.41 (L) 08/01/2021   GFRNONAA 54 (L) 06/02/2021   GFRAA 58 (L) 05/03/2019   NA 140 08/01/2021   K 4.2 08/01/2021   CALCIUM 9.4 08/01/2021   CO2 26 08/01/2021   GLUCOSE 86 08/01/2021    Lab Results  Component Value Date/Time   GFR 53.41 (L) 08/01/2021 08:01 AM   GFR 56.51 (L) 06/10/2020 01:41 PM    Last diabetic Eye exam: No results  found for: HMDIABEYEEXA  Last diabetic Foot  exam: No results found for: HMDIABFOOTEX   Lab Results  Component Value Date   CHOL 124 08/01/2021   HDL 39.60 08/01/2021   LDLCALC 66 08/01/2021   LDLDIRECT 107.6 12/14/2011   TRIG 94.0 08/01/2021   CHOLHDL 3 08/01/2021    Hepatic Function Latest Ref Rng & Units 08/01/2021 06/02/2021 06/10/2020  Total Protein 6.0 - 8.3 g/dL 6.8 7.2 6.6  Albumin 3.5 - 5.2 g/dL 4.3 4.4 4.3  AST 0 - 37 U/L _0 ALT 0 - 53 U/L _1 Alk Phosphatase 39 - 117 U/L 62 71 82  Total Bilirubin 0.2 - 1.2 mg/dL 0.8 0.7 0.7  Bilirubin, Direct 0.01 - 0.4 - - -    Lab Results  Component Value Date/Time   TSH 0.86 01/04/2020 11:31 AM   TSH 0.792 07/04/2019 01:30 PM   FREET4 0.92 01/04/2020 11:31 AM   FREET4 0.84 04/16/2016 02:58 PM    CBC Latest Ref Rng & Units 06/02/2021 04/08/2018 04/16/2016  WBC 4.0 - 10.5 K/uL 8.2 - 7.8  Hemoglobin 13.0 - 17.0 g/dL 15.6 15.0 13.7  Hematocrit 39.0 - 52.0 % 46.5 44.0 40.1  Platelets 150 - 400 K/uL 280 - 258.0    Lab Results  Component Value Date/Time   VD25OH 39.42 04/16/2016 02:58 PM   VD25OH 63 01/27/2013 09:02 AM    Clinical ASCVD: Yes  The ASCVD Risk score (Arnett DK, et al., 2019) failed to calculate for the following reasons:   The 2019 ASCVD risk score is only valid for ages 67 to 44    Depression screen PHQ 2/9 07/11/2021 07/10/2020 06/15/2019  Decreased Interest 0 0 0  Down, Depressed, Hopeless 0 0 0  PHQ - 2 Score 0 0 0  Altered sleeping - 0 0  Tired, decreased energy - 0 0  Change in appetite - 0 0  Feeling bad or failure about yourself  - 0 0  Trouble concentrating - 0 0  Moving slowly or fidgety/restless - 0 0  Suicidal thoughts - 0 0  PHQ-9 Score - 0 0  Difficult doing work/chores - Not difficult at all Not difficult at all     Social History   Tobacco Use  Smoking Status Former   Years: 8.00   Types: Cigarettes   Quit date: 01/05/1967   Years since quitting: 54.8  Smokeless Tobacco Never  Tobacco Comments   QUIT AGE 41    BP Readings from Last 3 Encounters:  10/15/21 110/68  08/08/21 128/66  06/03/21 122/66   Pulse Readings from Last 3 Encounters:  10/15/21 87  08/08/21 88  06/03/21 83   Wt Readings from Last 3 Encounters:  10/15/21 226 lb 12.8 oz (102.9 kg)  08/08/21 227 lb 8 oz (103.2 kg)  07/11/21 224 lb (101.6 kg)   BMI Readings from Last 3 Encounters:  10/15/21 33.49 kg/m  08/08/21 33.60 kg/m  07/11/21 33.08 kg/m    Assessment/Interventions: Review of patient past medical history, allergies, medications, health status, including review of consultants reports, laboratory and other test data, was performed as part of comprehensive evaluation and provision of chronic care management services.   SDOH:  (Social Determinants of Health) assessments and interventions performed: No - screenings completed 06/2021  SDOH Screenings   Alcohol Screen: Low Risk    Last Alcohol Screening Score (AUDIT): 0  Depression (PHQ2-9): Low Risk    PHQ-2 Score: 0  Financial Resource Strain: Low Risk  Difficulty of Paying Living Expenses: Not hard at all  Food Insecurity: No Food Insecurity   Worried About Charity fundraiser in the Last Year: Never true   Ran Out of Food in the Last Year: Never true  Housing: Low Risk    Last Housing Risk Score: 0  Physical Activity: Inactive   Days of Exercise per Week: 0 days   Minutes of Exercise per Session: 0 min  Social Connections: Engineer, building services of Communication with Friends and Family: More than three times a week   Frequency of Social Gatherings with Friends and Family: Twice a week   Attends Religious Services: More than 4 times per year   Active Member of [employer] or Organizations: Yes   Attends Archivist Meetings: Never   Marital Status: Married  Stress: No Stress Concern Present   Feeling of Stress : Not at all  Tobacco Use: Medium Risk   Smoking Tobacco Use: Former   Smokeless Tobacco Use: Never   Passive Exposure: Not on  Pensions consultant Needs: No Transportation Needs   Lack of Transportation (Medical): No   Lack of Transportation (Non-Medical): No    CCM Care Plan  Allergies  Allergen Reactions   Paroxetine     REACTION: Had mild personality change on this medication and doesn't want to take this again.   Morphine And Related     Causes bladder problems   Ciprofloxacin Other (See Comments)    ABDOMINAL PAIN/ sob   Flagyl [Metronidazole Hcl] Other (See Comments)    ABDOMINAL PAIN/ sob    Medications Reviewed Today     Reviewed by Charlton Haws, [facility] (Pharmacist) on 10/16/21 at 1356  Med List Status: <None>   Medication Order Taking? Sig Documenting Provider Last Dose Status Informant  albuterol (VENTOLIN HFA) 108 (90 Base) MCG/ACT inhaler [id number] Yes Inhale 2 puffs into the lungs every 6 (six) hours as needed for wheezing or shortness of breath. Jinny Sanders, MD Taking Active   amLODipine (NORVASC) 10 MG tablet [id number] Yes TAKE 1 TABLET BY MOUTH EVERY DAY Jettie Booze, MD Taking Active   aspirin 81 MG tablet [id number] Yes Take 81 mg by mouth daily. [provider] Taking Active Self  azithromycin (ZITHROMAX) 250 MG tablet [id number] Yes Take 1 tablet (250 mg total) by mouth daily. Rigoberto Noel, MD Taking Active   B-D 3CC LUER-LOK SYR 18GX1-1/2 18G X 1-1/2" 3 ML MISC [id number] Yes USE ONCE A WEEK AS DIRECTED [provider] Taking Active Self  fenofibrate micronized (LOFIBRA) 67 MG capsule [id number] Yes TAKE 1 CAPSULE(67 MG) BY MOUTH DAILY BEFORE BREAKFAST Jettie Booze, MD Taking Active   lisinopril (ZESTRIL) 40 MG tablet [id number] Yes TAKE 1 TABLET(40 MG) BY MOUTH DAILY Jettie Booze, MD Taking Active   omeprazole (PRILOSEC) 20 MG capsule [id number] Yes Take 20 mg by mouth daily. Per patient OTC [provider] Taking Active   psyllium (METAMUCIL) 58.6 % powder [id number] Yes Take 1 packet by mouth daily. [provider] Taking  Active Self  rosuvastatin (CRESTOR) 5 MG tablet [id number] Yes TAKE 1 TABLET(5 MG) BY MOUTH DAILY Jettie Booze, MD Taking Active   testosterone cypionate (DEPOTESTOSTERONE CYPIONATE) 200 MG/ML injection [id number] Yes Inject 0.5 mLs (100 mg total) into the muscle every 7 (seven) days. Jinny Sanders, MD Taking Active   triamcinolone (KENALOG) 0.025 % cream [id number] Yes Apply 1 application topically as needed. [provider] Taking Active  Patient Active Problem List   Diagnosis Date Noted   Oral lesion 08/08/2021   Chest tightness 06/03/2021   OSA on CPAP 04/29/2021   CAD (coronary artery disease) 02/27/2021   Pre-operative clearance 02/27/2021   Anterior neck pain 08/02/2020   Left epiretinal membrane 03/21/2020   Right epiretinal membrane 03/21/2020   Posterior vitreous detachment of right eye 03/21/2020   Posterior capsular opacification, right 03/21/2020   Memory loss 07/04/2019   Bilateral hearing loss 07/04/2019   Mild cognitive impairment with memory loss 06/16/2019   Pulmonary nodules 82/95/6213   Metabolic syndrome 08/65/7846   Hearing loss of both ears 04/16/2016   Fatty liver disease, nonalcoholic 96/29/5284   Routine general medical examination at a health care facility 04/15/2015   Acute cough 07/26/2013   Solitary cyst of kidney 05/03/2012   Nephrolithiasis 03/27/2011   HYPERCHOLESTEROLEMIA 12/09/2007   Testosterone deficiency 09/21/2007   Benign essential hypertension 04/07/2007   BMI 32.0-32.9,adult 02/16/2007   ASTHMA, CHILDHOOD 02/16/2007   GERD 02/16/2007   BPH (benign prostatic hyperplasia) 02/16/2007   OSTEOARTHRITIS 02/16/2007   Itching 02/16/2007    Immunization History  Administered Date(s) Administered   Fluad Quad(high Dose 65+) 06/16/2019, 08/02/2020, 08/08/2021   Influenza Split 06/26/2011, 07/11/2012   Influenza Whole 05/24/2008, 06/24/2008, 07/04/2009, 05/24/2010   Influenza, High Dose Seasonal PF 06/14/2018    Influenza,inj,Quad PF,6+ Mos 07/26/2013, 06/12/2014, 04/15/2015, 04/16/2016, 07/01/2017   PFIZER(Purple Top)SARS-COV-2 Vaccination 10/04/2019, 10/25/2019, 06/07/2020, 03/10/2021   Pneumococcal Conjugate-13 04/15/2015   Pneumococcal Polysaccharide-23 09/14/2007   Td 08/02/2007   Zoster, Live 04/08/2009    Conditions to be addressed/monitored:  Hypertension, Hyperlipidemia, Coronary Artery Disease, GERD, and Asthma  Care Plan : [place]  Updates made by Charlton Haws, [place] since 10/16/2021 12:00 AM     Problem: Hypertension, Hyperlipidemia, Coronary Artery Disease, GERD, and Asthma   Priority: High     Long-Range Goal: Disease mgmt   Start Date: 10/16/2021  Expected End Date: 10/16/2022  This Visit's Progress: On track  Priority: High  Note:   Current Barriers:  Unable to independently monitor therapeutic efficacy  Pharmacist Clinical Goal(s):  Patient will achieve adherence to monitoring guidelines and medication adherence to achieve therapeutic efficacy through collaboration with PharmD and provider.   Interventions: 1:1 collaboration with Jinny Sanders, MD regarding development and update of comprehensive plan of care as evidenced by provider attestation and co-signature Inter-disciplinary care team collaboration (see longitudinal plan of care) Comprehensive medication review performed; medication list updated in electronic medical record  Hypertension (BP goal <130/80) -Controlled - BP at goal in office and at home; pt endorses compliance with medications; he has OSA and wears CPAP; reports occasional lightheadedness upon standing but resolves quickly -Current home BP readings: 127/70, 132/80, 112/72 -Current treatment: Amlodipine 10 mg daily PM - Appropriate, Effective, Safe, Accessible Lisinopril 40 mg daily AM -Appropriate, Effective, Safe, Accessible -Medications previously tried: olmesartan, metoprolol -Educated on BP goals and benefits of  medications for prevention of heart attack, stroke and kidney damage; Importance of home blood pressure monitoring; -Counseled to monitor BP at home weekly -Recommended to continue current medication  Hyperlipidemia / CAD: (LDL goal < 70) -Controlled - LDL is at goal; Trig is at goal; pt endorses compliance with medications -Follows with Dr Irish Lack; Coronary CT showed aortic atherosclerosis, 75th percentile; per patient Trig were > 1000 20+ years ago -Current treatment: Rosuvastatin 5 mg daily PM -Appropriate, Effective, Safe, Accessible Fenofibrate (Lofibra) 67 mg daily AM -Appropriate, Effective, Safe, Accessible Aspirin EC  81 mg daily AM -Appropriate, Effective, Safe, Accessible -Medications previously tried: gemfibrozil (2008-2020), niacin -Current exercise habits: walking, had personal trainer last year -Educated on Cholesterol goals; Benefits of statin for ASCVD risk reduction; -Recommended to continue current medication  Asthma (Goal: control symptoms and prevent exacerbations) -Controlled - uses albuterol infrequently, usually just with respiratory infections -Current treatment  Albuterol HFA prn -Appropriate, Effective, Safe, Accessible -Frequency of rescue inhaler use: rare use -Counseled on Proper inhaler technique; -Recommended to continue current medication  GERD (Goal: manage symptoms) -Controlled - pt reports no breakthrough symptoms -Current treatment  Omeprazole 20 mg daily (OTC) AM -Appropriate, Effective, Safe, Accessible -Medications previously tried: n/a  -Recommended to continue current medication  Health Maintenance -Vaccine gaps: Shingrix, covid booster -Testosterone prescribed by urologist, pt has been on this a few years -Triamcinolone for eczema per patient -Current therapy:  Metamucil powder Testosterone injections 200 mg/mL - 100 mg q7 days Triamcinolone 0.025% cream -Patient is satisfied with current therapy and denies issues -Recommended to  continue current medication  Patient Goals/Self-Care Activities Patient will:  - take medications as prescribed as evidenced by patient report and record review focus on medication adherence by routine check blood pressure weekly, document, and provide at future appointments      Medication Assistance: None required.  Patient affirms current coverage meets needs.  Compliance/Adherence/Medication fill history: Care Gaps: NONE  Star-Rating Drugs: Lisinopril 40 mg - PDC 99% Rosuvastatin 5 mg - PDC 97%  Patient's preferred pharmacy is:  [id number] [address] [place] [address] Phone: [phone] Fax: [phone]  Uses pill box? No -   Pt endorses 100% compliance  We discussed: Current pharmacy is preferred with insurance plan and patient is satisfied with pharmacy services Patient decided to: Continue current medication management strategy  Care Plan and Follow Up Patient Decision:  Patient agrees to Care Plan and Follow-up.  Plan: Telephone follow up appointment with care management team member scheduled for:  1 year  Charlene Brooke, PharmD, [facility] Clinical Pharmacist [facility] [phone]

## 2021-10-21 DIAGNOSIS — I2510 Atherosclerotic heart disease of native coronary artery without angina pectoris: Secondary | ICD-10-CM

## 2021-10-21 DIAGNOSIS — J4520 Mild intermittent asthma, uncomplicated: Secondary | ICD-10-CM

## 2021-10-21 DIAGNOSIS — E7800 Pure hypercholesterolemia, unspecified: Secondary | ICD-10-CM | POA: Diagnosis not present

## 2021-10-21 DIAGNOSIS — I10 Essential (primary) hypertension: Secondary | ICD-10-CM

## 2021-11-20 DIAGNOSIS — H903 Sensorineural hearing loss, bilateral: Secondary | ICD-10-CM | POA: Diagnosis not present

## 2021-11-20 DIAGNOSIS — H6123 Impacted cerumen, bilateral: Secondary | ICD-10-CM | POA: Diagnosis not present

## 2021-12-02 DIAGNOSIS — H04123 Dry eye syndrome of bilateral lacrimal glands: Secondary | ICD-10-CM | POA: Diagnosis not present

## 2021-12-02 DIAGNOSIS — Z961 Presence of intraocular lens: Secondary | ICD-10-CM | POA: Diagnosis not present

## 2021-12-02 DIAGNOSIS — H40051 Ocular hypertension, right eye: Secondary | ICD-10-CM | POA: Diagnosis not present

## 2021-12-02 DIAGNOSIS — H532 Diplopia: Secondary | ICD-10-CM | POA: Diagnosis not present

## 2021-12-02 DIAGNOSIS — Z9889 Other specified postprocedural states: Secondary | ICD-10-CM | POA: Diagnosis not present

## 2021-12-02 DIAGNOSIS — H43391 Other vitreous opacities, right eye: Secondary | ICD-10-CM | POA: Diagnosis not present

## 2021-12-02 DIAGNOSIS — H43811 Vitreous degeneration, right eye: Secondary | ICD-10-CM | POA: Diagnosis not present

## 2021-12-02 DIAGNOSIS — H5021 Vertical strabismus, right eye: Secondary | ICD-10-CM | POA: Diagnosis not present

## 2021-12-02 DIAGNOSIS — H35371 Puckering of macula, right eye: Secondary | ICD-10-CM | POA: Diagnosis not present

## 2021-12-26 ENCOUNTER — Other Ambulatory Visit: Payer: Medicare HMO

## 2022-01-05 ENCOUNTER — Ambulatory Visit
Admission: RE | Admit: 2022-01-05 | Discharge: 2022-01-05 | Disposition: A | Discharge status: Home / Self Care | Payer: Medicare HMO | Source: Ambulatory Visit | Attending: Pulmonary Disease | Admitting: Pulmonary Disease

## 2022-01-05 DIAGNOSIS — R911 Solitary pulmonary nodule: Secondary | ICD-10-CM | POA: Diagnosis not present

## 2022-01-05 DIAGNOSIS — I700 Atherosclerosis of aorta: Secondary | ICD-10-CM | POA: Diagnosis not present

## 2022-01-05 DIAGNOSIS — R918 Other nonspecific abnormal finding of lung field: Secondary | ICD-10-CM

## 2022-01-07 ENCOUNTER — Telehealth: Payer: Self-pay | Admitting: Pulmonary Disease

## 2022-01-07 ENCOUNTER — Encounter: Payer: Self-pay | Admitting: Family Medicine

## 2022-01-07 DIAGNOSIS — I700 Atherosclerosis of aorta: Secondary | ICD-10-CM | POA: Insufficient documentation

## 2022-01-07 NOTE — Telephone Encounter (Signed)
Spoke to patient regarding results (see result note). Nothing further needed  ?

## 2022-01-21 ENCOUNTER — Encounter: Payer: Self-pay | Admitting: Pulmonary Disease

## 2022-01-21 ENCOUNTER — Ambulatory Visit: Payer: Medicare HMO | Admitting: Pulmonary Disease

## 2022-01-21 DIAGNOSIS — Z9989 Dependence on other enabling machines and devices: Secondary | ICD-10-CM | POA: Diagnosis not present

## 2022-01-21 DIAGNOSIS — G4733 Obstructive sleep apnea (adult) (pediatric): Secondary | ICD-10-CM

## 2022-01-21 DIAGNOSIS — R918 Other nonspecific abnormal finding of lung field: Secondary | ICD-10-CM

## 2022-01-21 NOTE — Assessment & Plan Note (Signed)
-   Stable nodules for more than 2 years and considered benign no follow-up imaging required

## 2022-01-21 NOTE — Progress Notes (Signed)
   Subjective:    Patient ID: Jack Norris, male    DOB: 05-31-1941, 81 y.o.   MRN: [id number]  HPI  81 yo for FU of OSA and asthmatic bronchitis and pulmonary nodules   He smoked less than 10 pack years and quit in 1969. OSA was diagnosed by an overnight sleep study in 07/2018   Initial OV 09/2021 increased CPAP to 7 due to snoring We gave him a prescription for Z-Pak for acute bronchitis.  This is now resolved. We reviewed CT chest today. He reports that her breathing is doing well, he rarely needs albuterol. He is compliant with his CPAP machine and is settled down with nasal pillows.  Denies any problems with mask or pressure, wife has noted less snoring since increasing the pressure.  He occasionally has dry mouth. He is planning a trip to [place] for 2 weeks  Significant tests/ events reviewed  07/2018-wt 206 pounds, TST 370 minutes, AHI 5.1/hour lowest desaturation 84%   07/2018 CPAP titration 7 cm   07/2018 CT coronaries-no lung nodules 11/2018 CT chest-decreasing lung nodules 12/2021 CT chest wo con >> stable nodules  Review of Systems neg for any significant sore throat, dysphagia, itching, sneezing, nasal congestion or excess/ purulent secretions, fever, chills, sweats, unintended wt loss, pleuritic or exertional cp, hempoptysis, orthopnea pnd or change in chronic leg swelling. Also denies presyncope, palpitations, heartburn, abdominal pain, nausea, vomiting, diarrhea or change in bowel or urinary habits, dysuria,hematuria, rash, arthralgias, visual complaints, headache, numbness weakness or ataxia.     Objective:   Physical Exam  Gen. Pleasant, elderly,well-nourished, in no distress ENT - no thrush, no pallor/icterus,no post nasal drip Neck: No JVD, no thyromegaly, no carotid bruits Lungs: no use of accessory muscles, no dullness to percussion, clear without rales or rhonchi  Cardiovascular: Rhythm regular, heart sounds  normal, no murmurs or gallops, no  peripheral edema Musculoskeletal: No deformities, no cyanosis or clubbing        Assessment & Plan:

## 2022-01-21 NOTE — Assessment & Plan Note (Signed)
CPAP download was reviewed which shows excellent control of events on 7 cm with minimal leak.  He is very compliant, more than 6.5 hours every night.  CPAP is certainly helped improve his daytime somnolence and fatigue CPAP supplies will be renewed for a year We discussed traveling issues with CPAP  Weight loss encouraged, compliance with goal of at least 4-6 hrs every night is the expectation. Advised against medications with sedative side effects Cautioned against driving when sleepy - understanding that sleepiness will vary on a day to day basis

## 2022-01-21 NOTE — Patient Instructions (Signed)
   Nodules are stable  CPAP is working well

## 2022-02-04 DIAGNOSIS — L570 Actinic keratosis: Secondary | ICD-10-CM | POA: Diagnosis not present

## 2022-02-04 DIAGNOSIS — B351 Tinea unguium: Secondary | ICD-10-CM | POA: Diagnosis not present

## 2022-02-04 DIAGNOSIS — C44222 Squamous cell carcinoma of skin of right ear and external auricular canal: Secondary | ICD-10-CM | POA: Diagnosis not present

## 2022-02-04 DIAGNOSIS — D225 Melanocytic nevi of trunk: Secondary | ICD-10-CM | POA: Diagnosis not present

## 2022-02-04 DIAGNOSIS — Z86018 Personal history of other benign neoplasm: Secondary | ICD-10-CM | POA: Diagnosis not present

## 2022-02-04 DIAGNOSIS — Z09 Encounter for follow-up examination after completed treatment for conditions other than malignant neoplasm: Secondary | ICD-10-CM | POA: Diagnosis not present

## 2022-02-04 DIAGNOSIS — D485 Neoplasm of uncertain behavior of skin: Secondary | ICD-10-CM | POA: Diagnosis not present

## 2022-02-04 DIAGNOSIS — L578 Other skin changes due to chronic exposure to nonionizing radiation: Secondary | ICD-10-CM | POA: Diagnosis not present

## 2022-02-04 DIAGNOSIS — L821 Other seborrheic keratosis: Secondary | ICD-10-CM | POA: Diagnosis not present

## 2022-02-04 DIAGNOSIS — Z85828 Personal history of other malignant neoplasm of skin: Secondary | ICD-10-CM | POA: Diagnosis not present

## 2022-02-13 DIAGNOSIS — L578 Other skin changes due to chronic exposure to nonionizing radiation: Secondary | ICD-10-CM | POA: Diagnosis not present

## 2022-02-13 DIAGNOSIS — C44222 Squamous cell carcinoma of skin of right ear and external auricular canal: Secondary | ICD-10-CM | POA: Diagnosis not present

## 2022-02-13 DIAGNOSIS — L814 Other melanin hyperpigmentation: Secondary | ICD-10-CM | POA: Diagnosis not present

## 2022-02-16 DIAGNOSIS — M25571 Pain in right ankle and joints of right foot: Secondary | ICD-10-CM | POA: Diagnosis not present

## 2022-02-16 DIAGNOSIS — M25572 Pain in left ankle and joints of left foot: Secondary | ICD-10-CM | POA: Diagnosis not present

## 2022-03-02 ENCOUNTER — Other Ambulatory Visit: Payer: Self-pay

## 2022-03-02 MED ORDER — ROSUVASTATIN CALCIUM 5 MG PO TABS: ORAL_TABLET | ORAL | 0 refills | Status: DC

## 2022-03-17 ENCOUNTER — Other Ambulatory Visit: Payer: Self-pay | Admitting: Interventional Cardiology

## 2022-03-25 DIAGNOSIS — R948 Abnormal results of function studies of other organs and systems: Secondary | ICD-10-CM | POA: Diagnosis not present

## 2022-03-25 DIAGNOSIS — E291 Testicular hypofunction: Secondary | ICD-10-CM | POA: Diagnosis not present

## 2022-03-27 ENCOUNTER — Telehealth: Payer: Self-pay

## 2022-03-27 NOTE — Chronic Care Management (AMB) (Signed)
    Chronic Care Management Pharmacy Assistant   Name: Jack Norris  MRN: [id number] DOB: October 03, 1940    Reason for Encounter: General Adherence     Recent office visits:  None since last CCM contact   Recent consult visits:  02/20/22-Dermatology-F/U mohs surgery- healing wound  f/u 6 months 01/21/22-Rakesh Alva,MD(pulmo)-f/u OSA ,asthma,no medication changes f/u 1 year 01/13/22-[facility]- f[facility]u [facility] RN- shingles vaccine given,referral for dermatology 12/15/21-[facility]- RN- covid 19 vaccine,f/u 12 months 12/02/21-Robert Groat (ophth)-no data found  11/20/21-[facility] Tami Ribas (oto)-no data found   Hospital visits:  None in previous 6 months  Medications: Outpatient Encounter Medications as of 03/27/2022  Medication Sig   albuterol (VENTOLIN HFA) 108 (90 Base) MCG/ACT inhaler Inhale 2 puffs into the lungs every 6 (six) hours as needed for wheezing or shortness of breath.   amLODipine (NORVASC) 10 MG tablet TAKE 1 TABLET BY MOUTH EVERY DAY   aspirin 81 MG tablet Take 81 mg by mouth daily.   B-D 3CC LUER-LOK SYR 18GX1-1/2 18G X 1-1/2" 3 ML MISC USE ONCE A WEEK AS DIRECTED   fenofibrate micronized (LOFIBRA) 67 MG capsule TAKE 1 CAPSULE(67 MG) BY MOUTH DAILY BEFORE BREAKFAST   lisinopril (ZESTRIL) 40 MG tablet TAKE 1 TABLET(40 MG) BY MOUTH DAILY   omeprazole (PRILOSEC) 20 MG capsule Take 20 mg by mouth daily. Per patient OTC   psyllium (METAMUCIL) 58.6 % powder Take 1 packet by mouth daily.   rosuvastatin (CRESTOR) 5 MG tablet TAKE 1 TABLET(5 MG) BY MOUTH DAILY   testosterone cypionate (DEPOTESTOSTERONE CYPIONATE) 200 MG/ML injection Inject 0.5 mLs (100 mg total) into the muscle every 7 (seven) days.   triamcinolone (KENALOG) 0.025 % cream Apply 1 application topically as needed.   No facility-administered encounter medications on file as of 03/27/2022.      Contacted JATHNIEL SMELTZER on 04/02/22 for general disease state and medication adherence call.   Patient is not more than 5 days past due  for refill on the following medications per chart history:  Star Medications: Medication Name/mg Last Fill Days Supply Lisinopril 40   02/05/22 90 Rosuvastatin '5mg'$   8/8//23  30    What concerns do you have about your medications? No concerns at this time   The patient denies side effects with their medications.   How often do you forget or accidentally miss a dose? Never  Do you use a pillbox? Yes  Are you having any problems getting your medications from your pharmacy? No   Routinely  no co pay with [facility]  Has the cost of your medications been a concern? No   Since last visit with CPP, no interventions have been made. The patient has recently signed up with [facility] and getting established  The patient has not had an ED visit since last contact.   The patient denies problems with their health. The patient just finished shingrix series, no issues   Patient denies concerns or questions for Charlene Brooke, PharmD at this time.   Counseled patient on:  [place] job taking medications, Importance of taking medication daily without missed doses, Benefits of adherence packaging or a pillbox, and Access to CCM team for any cost, medication or pharmacy concerns.   Care Gaps: Annual wellness visit in last year? Yes Most Recent BP reading:138/74  01/21/22  Upcoming appointments: PCP appointment on 08/13/22   Charlene Brooke, CPP notified  Avel Sensor, [place]  [phone]

## 2022-03-29 ENCOUNTER — Other Ambulatory Visit: Payer: Self-pay | Admitting: Interventional Cardiology

## 2022-03-31 ENCOUNTER — Encounter: Payer: Self-pay | Admitting: Interventional Cardiology

## 2022-03-31 DIAGNOSIS — R350 Frequency of micturition: Secondary | ICD-10-CM | POA: Diagnosis not present

## 2022-03-31 DIAGNOSIS — E291 Testicular hypofunction: Secondary | ICD-10-CM | POA: Diagnosis not present

## 2022-03-31 DIAGNOSIS — N401 Enlarged prostate with lower urinary tract symptoms: Secondary | ICD-10-CM | POA: Diagnosis not present

## 2022-04-06 DIAGNOSIS — H9121 Sudden idiopathic hearing loss, right ear: Secondary | ICD-10-CM | POA: Diagnosis not present

## 2022-04-06 DIAGNOSIS — H90A21 Sensorineural hearing loss, unilateral, right ear, with restricted hearing on the contralateral side: Secondary | ICD-10-CM | POA: Diagnosis not present

## 2022-04-06 DIAGNOSIS — H6123 Impacted cerumen, bilateral: Secondary | ICD-10-CM | POA: Diagnosis not present

## 2022-04-06 DIAGNOSIS — H6120 Impacted cerumen, unspecified ear: Secondary | ICD-10-CM | POA: Diagnosis not present

## 2022-04-07 ENCOUNTER — Encounter (INDEPENDENT_AMBULATORY_CARE_PROVIDER_SITE_OTHER): Payer: Self-pay | Admitting: Ophthalmology

## 2022-04-07 ENCOUNTER — Ambulatory Visit (INDEPENDENT_AMBULATORY_CARE_PROVIDER_SITE_OTHER): Payer: Medicare HMO | Admitting: Ophthalmology

## 2022-04-07 ENCOUNTER — Encounter: Payer: Self-pay | Admitting: Family Medicine

## 2022-04-07 DIAGNOSIS — H43811 Vitreous degeneration, right eye: Secondary | ICD-10-CM | POA: Diagnosis not present

## 2022-04-07 DIAGNOSIS — H35372 Puckering of macula, left eye: Secondary | ICD-10-CM | POA: Diagnosis not present

## 2022-04-07 DIAGNOSIS — H35373 Puckering of macula, bilateral: Secondary | ICD-10-CM | POA: Diagnosis not present

## 2022-04-07 DIAGNOSIS — H35371 Puckering of macula, right eye: Secondary | ICD-10-CM

## 2022-04-07 DIAGNOSIS — H353122 Nonexudative age-related macular degeneration, left eye, intermediate dry stage: Secondary | ICD-10-CM

## 2022-04-07 NOTE — Assessment & Plan Note (Deleted)
Physiologic with significant vitreous floaters centrally no longer symptomatic the patient

## 2022-04-07 NOTE — Assessment & Plan Note (Signed)
Physiologic with significant vitreous floaters centrally no longer symptomatic the patient

## 2022-04-07 NOTE — Assessment & Plan Note (Signed)
Stable overall with a very small epiretinal membrane in the foveal depression without topographic distortion or significant thickening I recommend continued observation

## 2022-04-07 NOTE — Assessment & Plan Note (Signed)
OS foveal drusen limits acuity.

## 2022-04-07 NOTE — Assessment & Plan Note (Signed)
Doing well post vitrectomy membrane peel left eye.  Outer retinal AMD foveal drusen limits acuity

## 2022-04-07 NOTE — Progress Notes (Signed)
04/07/2022     CHIEF COMPLAINT Patient presents for  Chief Complaint  Patient presents with   Epiretinal Membrane/preretinal Fibrosis/cellophane Maculopathy      HISTORY OF PRESENT ILLNESS: Jack Norris is a 81 y.o. male who presents to the clinic today for:   HPI   11 MOS FU OCT. "My right eye, I can see a vertical curvature in right eye a couple of months ago. I wanted to see Dr. Zadie Norris sooner, because of my right eye. I don't want the same thing happening to my right eye the way it did to my left eye. The left eye never corrected itself 100%, there are still some waviness. It makes it hard to read fine print, the print has to be enlarged. Pt has allergy to CIPROFLOXACIN.   Last edited by Silvestre Moment on 04/07/2022  9:06 AM.      Referring physician: Clent Jacks, MD [address] [place],  [place] [address]  HISTORICAL INFORMATION:   Selected notes from the MEDICAL RECORD NUMBER       CURRENT MEDICATIONS: No current outpatient medications on file. (Ophthalmic Drugs)   No current facility-administered medications for this visit. (Ophthalmic Drugs)   Current Outpatient Medications (Other)  Medication Sig   albuterol (VENTOLIN HFA) 108 (90 Base) MCG/ACT inhaler Inhale 2 puffs into the lungs every 6 (six) hours as needed for wheezing or shortness of breath.   amLODipine (NORVASC) 10 MG tablet TAKE 1 TABLET BY MOUTH EVERY DAY   aspirin 81 MG tablet Take 81 mg by mouth daily.   B-D 3CC LUER-LOK SYR 18GX1-1/2 18G X 1-1/2" 3 ML MISC USE ONCE A WEEK AS DIRECTED   fenofibrate micronized (LOFIBRA) 67 MG capsule TAKE 1 CAPSULE(67 MG) BY MOUTH DAILY BEFORE BREAKFAST   lisinopril (ZESTRIL) 40 MG tablet TAKE 1 TABLET(40 MG) BY MOUTH DAILY   omeprazole (PRILOSEC) 20 MG capsule Take 20 mg by mouth daily. Per patient OTC   psyllium (METAMUCIL) 58.6 % powder Take 1 packet by mouth daily.   rosuvastatin (CRESTOR) 5 MG tablet TAKE 1 TABLET(5 MG) BY MOUTH DAILY   testosterone  cypionate (DEPOTESTOSTERONE CYPIONATE) 200 MG/ML injection Inject 0.5 mLs (100 mg total) into the muscle every 7 (seven) days.   triamcinolone (KENALOG) 0.025 % cream Apply 1 application topically as needed.   No current facility-administered medications for this visit. (Other)      REVIEW OF SYSTEMS: ROS   Negative for: Constitutional, Gastrointestinal, Neurological, Skin, Genitourinary, Musculoskeletal, HENT, Endocrine, Cardiovascular, Eyes, Respiratory, Psychiatric, Allergic/Imm, Heme/Lymph Last edited by Silvestre Moment on 04/07/2022  9:06 AM.       ALLERGIES Allergies  Allergen Reactions   Paroxetine     REACTION: Had mild personality change on this medication and doesn't want to take this again.   Morphine And Related     Causes bladder problems   Ciprofloxacin Other (See Comments)    ABDOMINAL PAIN/ sob   Flagyl [Metronidazole Hcl] Other (See Comments)    ABDOMINAL PAIN/ sob    PAST MEDICAL HISTORY Past Medical History:  Diagnosis Date   Asthma, mild    as child-only now if has URI   BPH (benign prostatic hypertrophy)    BPPV (benign paroxysmal positional vertigo)    Diverticulosis    Eczema    Fatty liver disease, nonalcoholic    Frequent nosebleeds    History of   GERD (gastroesophageal reflux disease)    occ uses TUMS as needed   Glaucoma  History of bronchitis    History of colon polyps    History of diverticulitis    History of kidney stones    Hyperlipemia    Hypertension    Influenza A 11/2017   Memory changes    OA (osteoarthritis)    Obesity    Overactive bladder 2020   PVCs (premature ventricular contractions)    Renal calculi right    AND LEFT X1 NON-OBSTRUCTIVE   Right sided sciatica    Bilateral buttock and thighs   Simple renal cyst right interpoler, simpler in nature , asymptomatic  per urologist note (dr dahlstedt)  12-26-2011   BENIGN, has been drained   Testosterone deficiency    Wears glasses    Wears hearing aid    Past Surgical  History:  Procedure Laterality Date   APPENDECTOMY     bone spur removal     BILATERAL GREAT TOE   COLONOSCOPY     CYSTO/ RIGHT RETROGRADE PYELOGRAM/ RIGHT URETER DILATATION/ RIGHT URETEROSCOPY/ LASER LITHOTRIPSY RIGHT RENAL PELVIS AND LOWER POLE CALCULI/ PLACEMENT STENT  01-11-2012  DR DAHLSTEDT   RIGHT RENAL PELVIS AND LOWER POLE CALCULI   CYSTOSCOPY W/ RETROGRADES  06/02/2012   Procedure: CYSTOSCOPY WITH RETROGRADE PYELOGRAM;  Surgeon: Franchot Gallo, MD;  Location: [facility];  Service: Urology;  Laterality: Right;   CYSTOSCOPY WITH URETEROSCOPY  06/02/2012   Procedure: CYSTOSCOPY WITH URETEROSCOPY;  Surgeon: Franchot Gallo, MD;  Location: [facility];  Service: Urology;  Laterality: Right;  90 MIN ALSO EXTRACTION OF CALCULI    EYE SURGERY Bilateral 2020   LITHOTRIPSY  APPROX.  1998   ESWL  left kidney x 2   ROTATOR CUFF REPAIR  2012   LEFT SHOULDER   SHOULDER ARTHROSCOPY WITH ROTATOR CUFF REPAIR AND SUBACROMIAL DECOMPRESSION  09/12/2012   Procedure: SHOULDER ARTHROSCOPY WITH ROTATOR CUFF REPAIR AND SUBACROMIAL DECOMPRESSION;  Surgeon: Nita Sells, MD;  Location: [facility];  Service: Orthopedics;  Laterality: Right;  see above and biceps tendonotomy   SKIN LESION EXCISION Right 07/07/2021   right side of face near right ear   TRANSURETHRAL RESECTION OF BLADDER NECK N/A 04/08/2018   Procedure: TRANSURETHRAL RESECTION OF BLADDER NECK CONTRACTURE;  Surgeon: Franchot Gallo, MD;  Location: [facility];  Service: Urology;  Laterality: N/A;   TRANSURETHRAL RESECTION OF PROSTATE  11/29/2009   BPH   VASECTOMY  1970'S   GEN. ANES.    FAMILY HISTORY Family History  Problem Relation Age of Onset   Cancer Mother        breast and kidney    Squamous cell carcinoma Mother    Cancer Father        lung   Heart disease Father     SOCIAL HISTORY Social History   Tobacco Use   Smoking status: Former     Years: 8.00    Types: Cigarettes    Quit date: 01/05/1967    Years since quitting: 55.2   Smokeless tobacco: Never   Tobacco comments:    QUIT AGE 63  Vaping Use   Vaping Use: Never used  Substance Use Topics   Alcohol use: Yes    Alcohol/week: 0.0 standard drinks of alcohol    Comment: OCCASIONAL   Drug use: No         OPHTHALMIC EXAM:  Base Eye Exam     Visual Acuity (ETDRS)       Right Left   Dist cc 20/25 -1 +1 20/20 -  2    Correction: Glasses         Tonometry (Tonopen, 9:11 AM)       Right Left   Pressure 15 16         Pupils       Pupils APD   Right PERRL None   Left PERRL None         Visual Fields       Left Right    Full Full         Extraocular Movement       Right Left    Full, Ortho Full, Ortho         Neuro/Psych     Oriented x3: Yes   Mood/Affect: Normal         Dilation     Both eyes: 1.0% Mydriacyl, 2.5% Phenylephrine @ 9:11 AM           Slit Lamp and Fundus Exam     External Exam       Right Left   External Normal Normal         Slit Lamp Exam       Right Left   Lids/Lashes Normal Normal   Conjunctiva/Sclera White and quiet White and quiet   Cornea Clear Clear   Anterior Chamber Deep and quiet Deep and quiet   Iris Round and reactive Round and reactive   Lens Posterior chamber intraocular lens Posterior chamber intraocular lens   Anterior Vitreous Normal Normal         Fundus Exam       Right Left   Posterior Vitreous Posterior vitreous detachment, Central vitreous floaters Clear, avitric   Disc Normal Normal   C/D Ratio 0.55 0.55   Macula Epiretinal membrane, no topo distortion no topo distortion, Hard drusen subfoveal   Vessels Normal Normal   Periphery Normal Normal            IMAGING AND PROCEDURES  Imaging and Procedures for 04/07/22  OCT, Retina - OU - Both Eyes       Right Eye Quality was good. Scan locations included subfoveal. Central Foveal Thickness: 315.  Progression has been stable. Findings include normal foveal contour.   Left Eye Quality was good. Scan locations included subfoveal. Central Foveal Thickness: 416. Progression has improved. Findings include abnormal foveal contour.   Notes OS, continued slow improvement of the central foveal thickness, now over 2 year post vitrectomy and internal limiting membrane peel.  Patient has been on CPAP now for some 28 months and this contributes to the improved macular findings my opinion  OD, small previous foveal condensation of the cortical hyaloid over the foveal depression but without distortion recommend observe.  Seen in 2020.              ASSESSMENT/PLAN:  Right epiretinal membrane Stable overall with a very small epiretinal membrane in the foveal depression without topographic distortion or significant thickening I recommend continued observation  Posterior vitreous detachment of right eye Physiologic with significant vitreous floaters centrally no longer symptomatic the patient  Left epiretinal membrane Doing well post vitrectomy membrane peel left eye.  Outer retinal AMD foveal drusen limits acuity  Intermediate stage nonexudative age-related macular degeneration of left eye OS foveal drusen limits acuity.     ICD-10-CM   1. Left epiretinal membrane  H35.372 OCT, Retina - OU - Both Eyes    2. Right epiretinal membrane  H35.371     3. Posterior vitreous detachment of right  eye  H43.811     4. Intermediate stage nonexudative age-related macular degeneration of left eye  H35.3122       1.  OD, minor symptomatic distortion of the vision, no significant epiretinal membrane and certainly no wet AMD.  We will continue to monitor and observe this condition.  2.  Floaters OD, not symptomatic  3 history of epiretinal membrane removal left eye doing very well will observe  Ophthalmic Meds Ordered this visit:  No orders of the defined types were placed in this  encounter.      Return in about 6 months (around 10/08/2022) for DILATE OU, COLOR FP, OCT.  There are no Patient Instructions on file for this visit.   Explained the diagnoses, plan, and follow up with the patient and they expressed understanding.  Patient expressed understanding of the importance of proper follow up care.   Clent Demark Harrison Zetina M.D. Diseases & Surgery of the Retina and Vitreous [facility] [place] 04/07/22     Abbreviations: M myopia (nearsighted); A astigmatism; H hyperopia (farsighted); P presbyopia; Mrx spectacle prescription;  CTL contact lenses; OD right eye; OS left eye; OU both eyes  XT exotropia; ET esotropia; PEK punctate epithelial keratitis; PEE punctate epithelial erosions; DES dry eye syndrome; MGD meibomian gland dysfunction; ATs artificial tears; PFAT's preservative free artificial tears; [place] nuclear sclerotic cataract; PSC posterior subcapsular cataract; ERM epi-retinal membrane; PVD posterior vitreous detachment; RD retinal detachment; DM diabetes mellitus; DR diabetic retinopathy; NPDR non-proliferative diabetic retinopathy; PDR proliferative diabetic retinopathy; CSME clinically significant macular edema; DME diabetic macular edema; dbh dot blot hemorrhages; CWS cotton wool spot; POAG primary open angle glaucoma; C/D cup-to-disc ratio; HVF humphrey visual field; GVF goldmann visual field; OCT optical coherence tomography; IOP intraocular pressure; BRVO Branch retinal vein occlusion; CRVO central retinal vein occlusion; CRAO central retinal artery occlusion; BRAO branch retinal artery occlusion; RT retinal tear; SB scleral buckle; PPV pars plana vitrectomy; VH Vitreous hemorrhage; PRP panretinal laser photocoagulation; IVK intravitreal kenalog; VMT vitreomacular traction; MH Macular hole;  NVD neovascularization of the disc; NVE neovascularization elsewhere; AREDS age related eye disease study; ARMD age related macular degeneration; POAG primary open angle  glaucoma; EBMD epithelial/anterior basement membrane dystrophy; ACIOL anterior chamber intraocular lens; IOL intraocular lens; PCIOL posterior chamber intraocular lens; Phaco/IOL phacoemulsification with intraocular lens placement; [place] photorefractive keratectomy; LASIK laser assisted in situ keratomileusis; HTN hypertension; DM diabetes mellitus; COPD chronic obstructive pulmonary disease

## 2022-04-13 NOTE — Progress Notes (Unsigned)
Cardiology Office Note   Date:  04/14/2022   ID:  Jack Norris, DOB 1941/02/10, MRN [id number]  PCP:  Jinny Sanders, MD    No chief complaint on file.  CAD  Wt Readings from Last 3 Encounters:  04/14/22 218 lb (98.9 kg)  01/21/22 221 lb 6.4 oz (100.4 kg)  10/15/21 226 lb 12.8 oz (102.9 kg)       History of Present Illness: Jack Norris is a 81 y.o. male  With hyperlipidemia.  He has been on lopid for years.     He has had palpitations in the past.  No management with a cardiologist at that time.  Sx have resolved.   He has lost weight in the past.   His father had CAD, CABG x 4, chronic smoker.  Mother passed away from cancer at 95.     Stress test in 2018: Blood pressure demonstrated a hypertensive response to exercise. There was no ST segment deviation noted during stress.   No ischemia. Very frequent monomorphic PVCs during peak stress and occasional PVCs in recovery. Normal exercise tolerance. Hypertensive response to exercise with max blood pressure 213/87 mmHg.   He had the flu in early 2019.    In late 2019, he had some chest discomfort while at the dentist.   He underwent CT Angio of the coronary arteries.  FFR in all 3 coronary distributions was essentially negative, with the exception of the very distal LAD.  However, no stenosis was identified by angiography.  There was suspicion of a PDA lesion of significance, but not mention to be significant by FFR.  At that point, we told him to watch for symptoms.  Any further symptoms would have [place] perform coronary angiography.   In 2020, it was noted that he has gained weight.  He has not been exercising much.  BP was high.  He saw his PMD.     He had an MRI of the brain in 07/2019, which was unremarkable.   BP was better controlled after the lisinopril was increased.   In 2021, he has had some neck pain when turns his head a certain way.  He has had issues with grinding his teeth as well.  He had  botox to help with this in early in early 2021.  ENT eval was negative.  Omeprazole was added.   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  Has not seen the trainer in over a month due to travelling.     Received vaccines in early 2021.  Denies : Chest pain. Dizziness. Leg edema. Nitroglycerin use. Orthopnea. Palpitations. Paroxysmal nocturnal dyspnea. Shortness of breath. Syncope.    He is finishing a prednisone taper for hearing loss.   Had left eye surgery.        Past Medical History:  Diagnosis Date   Asthma, mild    as child-only now if has URI   BPH (benign prostatic hypertrophy)    BPPV (benign paroxysmal positional vertigo)    Diverticulosis    Eczema    Fatty liver disease, nonalcoholic    Frequent nosebleeds    History of   GERD (gastroesophageal reflux disease)    occ uses TUMS as needed   Glaucoma    History of bronchitis    History of colon polyps    History of diverticulitis    History of kidney stones    Hyperlipemia    Hypertension    Influenza  A 11/2017   Memory changes    OA (osteoarthritis)    Obesity    Overactive bladder 2020   PVCs (premature ventricular contractions)    Renal calculi right    AND LEFT X1 NON-OBSTRUCTIVE   Right sided sciatica    Bilateral buttock and thighs   Simple renal cyst right interpoler, simpler in nature , asymptomatic  per urologist note (dr dahlstedt)  12-26-2011   BENIGN, has been drained   Testosterone deficiency    Wears glasses    Wears hearing aid     Past Surgical History:  Procedure Laterality Date   APPENDECTOMY     bone spur removal     BILATERAL GREAT TOE   COLONOSCOPY     CYSTO/ RIGHT RETROGRADE PYELOGRAM/ RIGHT URETER DILATATION/ RIGHT URETEROSCOPY/ LASER LITHOTRIPSY RIGHT RENAL PELVIS AND LOWER POLE CALCULI/ PLACEMENT STENT  01-11-2012  DR DAHLSTEDT   RIGHT RENAL PELVIS AND LOWER POLE CALCULI   CYSTOSCOPY W/ RETROGRADES  06/02/2012   Procedure: CYSTOSCOPY WITH  RETROGRADE PYELOGRAM;  Surgeon: Franchot Gallo, MD;  Location: [facility];  Service: Urology;  Laterality: Right;   CYSTOSCOPY WITH URETEROSCOPY  06/02/2012   Procedure: CYSTOSCOPY WITH URETEROSCOPY;  Surgeon: Franchot Gallo, MD;  Location[facility];  Service: Urology;  Laterality: Right;  90 MIN ALSO EXTRACTION OF CALCULI    EYE SURGERY Bilateral 2020   LITHOTRIPSY  APPROX.  1998   ESWL  left kidney x 2   ROTATOR CUFF REPAIR  2012   LEFT SHOULDER   SHOULDER ARTHROSCOPY WITH ROTATOR CUFF REPAIR AND SUBACROMIAL DECOMPRESSION  09/12/2012   Procedure: SHOULDER ARTHROSCOPY WITH ROTATOR CUFF REPAIR AND SUBACROMIAL DECOMPRESSION;  Surgeon: Nita Sells, MD;  Location: [facility];  Service: Orthopedics;  Laterality: Right;  see above and biceps tendonotomy   SKIN LESION EXCISION Right 07/07/2021   right side of face near right ear   TRANSURETHRAL RESECTION OF BLADDER NECK N/A 04/08/2018   Procedure: TRANSURETHRAL RESECTION OF BLADDER NECK CONTRACTURE;  Surgeon: Franchot Gallo, MD;  Location: [facility];  Service: Urology;  Laterality: N/A;   TRANSURETHRAL RESECTION OF PROSTATE  11/29/2009   BPH   VASECTOMY  1970'S   GEN. ANES.     Current Outpatient Medications  Medication Sig Dispense Refill   albuterol (VENTOLIN HFA) 108 (90 Base) MCG/ACT inhaler Inhale 2 puffs into the lungs every 6 (six) hours as needed for wheezing or shortness of breath. 8 g 2   amLODipine (NORVASC) 10 MG tablet TAKE 1 TABLET BY MOUTH EVERY DAY 30 tablet 0   aspirin 81 MG tablet Take 81 mg by mouth daily.     B-D 3CC LUER-LOK SYR 18GX1-1/2 18G X 1-1/2" 3 ML MISC USE ONCE A WEEK AS DIRECTED  2   fenofibrate micronized (LOFIBRA) 67 MG capsule TAKE 1 CAPSULE(67 MG) BY MOUTH DAILY BEFORE BREAKFAST 90 capsule 0   lisinopril (ZESTRIL) 40 MG tablet TAKE 1 TABLET(40 MG) BY MOUTH DAILY 90 tablet 1   omeprazole (PRILOSEC) 20 MG capsule Take 20  mg by mouth daily. Per patient OTC     predniSONE (DELTASONE) 50 MG tablet Take 50 mg by mouth 4 (four) times daily.     psyllium (METAMUCIL) 58.6 % powder Take 1 packet by mouth daily.     rosuvastatin (CRESTOR) 5 MG tablet TAKE 1 TABLET(5 MG) BY MOUTH DAILY 30 tablet 0   testosterone cypionate (DEPOTESTOSTERONE CYPIONATE) 200 MG/ML injection Inject 0.5 mLs (100 mg total)  into the muscle every 7 (seven) days. 10 mL 0   triamcinolone (KENALOG) 0.025 % cream Apply 1 application topically as needed.     No current facility-administered medications for this visit.    Allergies:   Paroxetine, Morphine and related, Ciprofloxacin, and Flagyl [metronidazole hcl]    Social History:  The patient  reports that he quit smoking about 55 years ago. His smoking use included cigarettes. He has never used smokeless tobacco. He reports current alcohol use. He reports that he does not use drugs.   Family History:  The patient's family history includes Cancer in his father and mother; Heart disease in his father; Squamous cell carcinoma in his mother.    ROS:  Please see the history of present illness.   Otherwise, review of systems are positive for hearing loss on right ear.   All other systems are reviewed and negative.    PHYSICAL EXAM: VS:  BP 124/70 (BP Location: Left Arm, Patient Position: Sitting, Cuff Size: Normal)   Pulse 79   Ht '5\' 9"'$  (1.753 m)   Wt 218 lb (98.9 kg)   BMI 32.19 kg/m  , BMI Body mass index is 32.19 kg/m. GEN: Well nourished, well developed, in no acute distress HEENT: normal Neck: no JVD, carotid bruits, or masses Cardiac: RRR; no murmurs, rubs, or gallops,no edema  Respiratory:  clear to auscultation bilaterally, normal work of breathing GI: soft, nontender, nondistended, + BS, obese MS: no deformity or atrophy Skin: warm and dry, no rash Neuro:  Strength and sensation are intact Psych: euthymic mood, full affect   EKG:   The ekg ordered today demonstrates NSR IRBBB,  no ST changes   Recent Labs: 06/02/2021: Hemoglobin 15.6; Platelets 280 08/01/2021: ALT 15; BUN 14; Creatinine, Ser 1.27; Potassium 4.2; Sodium 140   Lipid Panel    Component Value Date/Time   CHOL 124 08/01/2021 0801   CHOL 126 12/12/2018 0810   TRIG 94.0 08/01/2021 0801   HDL 39.60 08/01/2021 0801   HDL 44 12/12/2018 0810   CHOLHDL 3 08/01/2021 0801   VLDL 18.8 08/01/2021 0801   LDLCALC 66 08/01/2021 0801   LDLCALC 67 12/12/2018 0810   LDLDIRECT 107.6 12/14/2011 0954     Other studies Reviewed: Additional studies/ records that were reviewed today with results demonstrating: LDL 66 in 07/2021.   ASSESSMENT AND PLAN:  CAD: Moderate by CT angiography.  Continue aggressive secondary prevention.  No angina on medical therapy.  DM: High-fiber diet.  He needs to try to get to the exercise target noted below. HTN: Low-salt diet.  Avoid processed foods.  Whole food, plant-based diet recommended.  Jarrett [place] that is fine and healthy I will my problem, Obesity: Portion control. FH CAD: No change.  PVCs: No sx.    Current medicines are reviewed at length with the patient today.  The patient concerns regarding his medicines were addressed.  The following changes have been made:  No change  Labs/ tests ordered today include:  No orders of the defined types were placed in this encounter.   Recommend 150 minutes/week of aerobic exercise Low fat, low carb, high fiber diet recommended  Disposition:   FU in 1 year   Signed, Larae Grooms, MD  04/14/2022 3:38 PM    [facility] [place], [place], [place]  [address] Phone: [phone]; Fax: [phone]

## 2022-04-14 ENCOUNTER — Encounter: Payer: Self-pay | Admitting: Interventional Cardiology

## 2022-04-14 ENCOUNTER — Ambulatory Visit: Payer: Medicare HMO | Admitting: Interventional Cardiology

## 2022-04-14 VITALS — BP 124/70 | HR 79 | Ht 69.0 in | Wt 218.0 lb

## 2022-04-14 DIAGNOSIS — I493 Ventricular premature depolarization: Secondary | ICD-10-CM

## 2022-04-14 DIAGNOSIS — I10 Essential (primary) hypertension: Secondary | ICD-10-CM

## 2022-04-14 DIAGNOSIS — Z8249 Family history of ischemic heart disease and other diseases of the circulatory system: Secondary | ICD-10-CM

## 2022-04-14 DIAGNOSIS — I2510 Atherosclerotic heart disease of native coronary artery without angina pectoris: Secondary | ICD-10-CM | POA: Diagnosis not present

## 2022-04-14 DIAGNOSIS — E782 Mixed hyperlipidemia: Secondary | ICD-10-CM

## 2022-04-14 NOTE — Patient Instructions (Signed)

## 2022-04-15 ENCOUNTER — Encounter: Payer: Self-pay | Admitting: Interventional Cardiology

## 2022-04-29 DIAGNOSIS — H90A21 Sensorineural hearing loss, unilateral, right ear, with restricted hearing on the contralateral side: Secondary | ICD-10-CM | POA: Diagnosis not present

## 2022-04-29 DIAGNOSIS — H903 Sensorineural hearing loss, bilateral: Secondary | ICD-10-CM | POA: Diagnosis not present

## 2022-05-01 ENCOUNTER — Other Ambulatory Visit: Payer: Self-pay | Admitting: Student

## 2022-05-01 ENCOUNTER — Other Ambulatory Visit: Payer: Self-pay | Admitting: Interventional Cardiology

## 2022-05-01 DIAGNOSIS — H90A21 Sensorineural hearing loss, unilateral, right ear, with restricted hearing on the contralateral side: Secondary | ICD-10-CM

## 2022-05-03 ENCOUNTER — Other Ambulatory Visit: Payer: Self-pay | Admitting: Interventional Cardiology

## 2022-05-03 DIAGNOSIS — Z79899 Other long term (current) drug therapy: Secondary | ICD-10-CM

## 2022-05-03 DIAGNOSIS — I10 Essential (primary) hypertension: Secondary | ICD-10-CM

## 2022-05-04 ENCOUNTER — Encounter: Payer: Self-pay | Admitting: Family Medicine

## 2022-05-05 ENCOUNTER — Ambulatory Visit (INDEPENDENT_AMBULATORY_CARE_PROVIDER_SITE_OTHER): Payer: Medicare HMO

## 2022-05-05 DIAGNOSIS — Z23 Encounter for immunization: Secondary | ICD-10-CM

## 2022-05-25 ENCOUNTER — Ambulatory Visit
Admission: RE | Admit: 2022-05-25 | Discharge: 2022-05-25 | Disposition: A | Discharge status: Home / Self Care | Payer: Medicare HMO | Source: Ambulatory Visit | Attending: Student | Admitting: Student

## 2022-05-25 DIAGNOSIS — H90A21 Sensorineural hearing loss, unilateral, right ear, with restricted hearing on the contralateral side: Secondary | ICD-10-CM

## 2022-05-25 MED ORDER — GADOPICLENOL 0.5 MMOL/ML IV SOLN
10.0000 mL | Freq: Once | INTRAVENOUS | Status: AC | PRN
  Administered 2022-05-25: 10 mL via INTRAVENOUS

## 2022-06-03 DIAGNOSIS — Z0100 Encounter for examination of eyes and vision without abnormal findings: Secondary | ICD-10-CM | POA: Diagnosis not present

## 2022-06-03 DIAGNOSIS — Z961 Presence of intraocular lens: Secondary | ICD-10-CM | POA: Diagnosis not present

## 2022-06-03 DIAGNOSIS — H43811 Vitreous degeneration, right eye: Secondary | ICD-10-CM | POA: Diagnosis not present

## 2022-06-03 DIAGNOSIS — H40051 Ocular hypertension, right eye: Secondary | ICD-10-CM | POA: Diagnosis not present

## 2022-06-03 DIAGNOSIS — Z9889 Other specified postprocedural states: Secondary | ICD-10-CM | POA: Diagnosis not present

## 2022-06-03 DIAGNOSIS — H5021 Vertical strabismus, right eye: Secondary | ICD-10-CM | POA: Diagnosis not present

## 2022-06-03 DIAGNOSIS — H532 Diplopia: Secondary | ICD-10-CM | POA: Diagnosis not present

## 2022-06-03 DIAGNOSIS — H43391 Other vitreous opacities, right eye: Secondary | ICD-10-CM | POA: Diagnosis not present

## 2022-06-03 DIAGNOSIS — H35371 Puckering of macula, right eye: Secondary | ICD-10-CM | POA: Diagnosis not present

## 2022-06-03 DIAGNOSIS — H04123 Dry eye syndrome of bilateral lacrimal glands: Secondary | ICD-10-CM | POA: Diagnosis not present

## 2022-06-22 ENCOUNTER — Other Ambulatory Visit: Payer: Self-pay | Admitting: Interventional Cardiology

## 2022-06-29 DIAGNOSIS — H6121 Impacted cerumen, right ear: Secondary | ICD-10-CM | POA: Diagnosis not present

## 2022-06-29 DIAGNOSIS — H90A21 Sensorineural hearing loss, unilateral, right ear, with restricted hearing on the contralateral side: Secondary | ICD-10-CM | POA: Diagnosis not present

## 2022-07-21 ENCOUNTER — Encounter: Payer: Self-pay | Admitting: Family Medicine

## 2022-07-21 ENCOUNTER — Ambulatory Visit (INDEPENDENT_AMBULATORY_CARE_PROVIDER_SITE_OTHER): Payer: Medicare HMO | Admitting: Family Medicine

## 2022-07-21 VITALS — BP 118/66 | HR 80 | Temp 98.4°F | Ht 69.0 in | Wt 222.4 lb

## 2022-07-21 DIAGNOSIS — U071 COVID-19: Secondary | ICD-10-CM | POA: Diagnosis not present

## 2022-07-21 MED ORDER — MOLNUPIRAVIR EUA 200MG CAPSULE: 4.0000 | ORAL_CAPSULE | Freq: Two times a day (BID) | ORAL | 0 refills | Status: AC

## 2022-07-21 NOTE — Patient Instructions (Signed)
Rest , fluids. Complete antiviral.  Call if not improving as expected.  Go to ER if severe shortness of breath.

## 2022-07-21 NOTE — Assessment & Plan Note (Signed)
COVID19  Infection < 5 days from onset of symptoms in  vaccinated overweight individual with history of CAD, HTN  No clear sign of bacterial infection at this time.   No SOB.  No red flags/need for ER visit or in-person exam at respiratory clinic at this time..    Pt higher risk for COVID complications given  HTN, CAD and age.  medication contraindications to paxlovid: amlodipine  Start molnupiravir 5 day course. Reviewed course of medication and side effect profile with patient in detail.   Symptomatic care with mucinex and cough suppressant at night. If SOB begins symptoms worsening.. have low threshold for in-person exam, if severe shortness of breath ER visit recommended.  Can monitor Oxygen saturation at home with home monitor if able to obtain.  Go to ER if O2 sat < 90% on room air.   Reviewed home care and provided information through [employer].  Recommended quarantine 5 days isolation recommended. Return to work day 6 and wear mask for 4 more days to complete 10 days. Provided info about prevention of spread of COVID 19.

## 2022-07-21 NOTE — Progress Notes (Signed)
Patient ID: Jack Norris, male    DOB: 10-15-1940, 81 y.o.   MRN: [id number]  This visit was conducted in person.  BP 118/66   Pulse 80   Temp 98.4 F (36.9 C) (Oral)   Ht '5\' 9"'$  (1.753 m)   Wt 222 lb 6 oz (100.9 kg)   SpO2 96%   BMI 32.84 kg/m    CC:  Chief Complaint  Patient presents with   Covid Positive    Positive home test yesterday Symptoms started on Saturday   Sore Throat   Headache   Fever   Sinus Drainage   Cough    Subjective:   HPI: Jack Norris is a 81 y.o. male with history of hypertension, coronary artery disease, fatty liver presenting on 07/21/2022 for Covid Positive (Positive home test yesterday/Symptoms started on Saturday), Sore Throat, Headache, Fever, Sinus Drainage, and Cough  Date of onset:  07/18/22 Positive COVID test on 07/20/22  Symptoms started with cold symptoms, fatigue Symptoms progressed to fever, subjective.  Having cough, dry. Feeling fatigued,  mild body aches  Has some ST, HA. No ear pain. No sinus pain or pressure. Feeling dizzy and unbalnaced ( has history of Menires  Using ibuprofen  Sick contacts: daughter Was out of town in [place], his sister passed away.  No history of chronic lung disease.    Most recent COVID vaccine 2021  Relevant past medical, surgical, family and social history reviewed and updated as indicated. Interim medical history since our last visit reviewed. Allergies and medications reviewed and updated. Outpatient Medications Prior to Visit  Medication Sig Dispense Refill   albuterol (VENTOLIN HFA) 108 (90 Base) MCG/ACT inhaler Inhale 2 puffs into the lungs every 6 (six) hours as needed for wheezing or shortness of breath. 8 g 2   amLODipine (NORVASC) 10 MG tablet TAKE 1 TABLET BY MOUTH EVERY DAY 90 tablet 3   aspirin 81 MG tablet Take 81 mg by mouth daily.     B-D 3CC LUER-LOK SYR 18GX1-1/2 18G X 1-1/2" 3 ML MISC USE ONCE A WEEK AS DIRECTED  2   fenofibrate micronized (LOFIBRA) 67 MG  capsule TAKE 1 CAPSULE(67 MG) BY MOUTH DAILY BEFORE BREAKFAST 90 capsule 3   lisinopril (ZESTRIL) 40 MG tablet TAKE 1 TABLET(40 MG) BY MOUTH DAILY 90 tablet 3   omeprazole (PRILOSEC) 20 MG capsule Take 20 mg by mouth daily. Per patient OTC     psyllium (METAMUCIL) 58.6 % powder Take 1 packet by mouth daily.     rosuvastatin (CRESTOR) 5 MG tablet TAKE 1 TABLET(5 MG) BY MOUTH DAILY 90 tablet 3   testosterone cypionate (DEPOTESTOSTERONE CYPIONATE) 200 MG/ML injection Inject 0.5 mLs (100 mg total) into the muscle every 7 (seven) days. 10 mL 0   triamcinolone (KENALOG) 0.025 % cream Apply 1 application topically as needed.     predniSONE (DELTASONE) 50 MG tablet Take 50 mg by mouth 4 (four) times daily.     No facility-administered medications prior to visit.     Per HPI unless specifically indicated in ROS section below Review of Systems  Constitutional:  Positive for fatigue and fever.  HENT:  Positive for congestion and rhinorrhea. Negative for ear pain and sinus pressure.   Eyes:  Negative for pain.  Respiratory:  Positive for cough. Negative for shortness of breath.   Cardiovascular:  Negative for chest pain, palpitations and leg swelling.  Gastrointestinal:  Negative for abdominal pain.  Genitourinary:  Negative for dysuria.  Musculoskeletal:  Negative for arthralgias.  Neurological:  Negative for syncope, light-headedness and headaches.  Psychiatric/Behavioral:  Negative for dysphoric mood.    Objective:  BP 118/66   Pulse 80   Temp 98.4 F (36.9 C) (Oral)   Ht '5\' 9"'$  (1.753 m)   Wt 222 lb 6 oz (100.9 kg)   SpO2 96%   BMI 32.84 kg/m   Wt Readings from Last 3 Encounters:  07/21/22 222 lb 6 oz (100.9 kg)  04/14/22 218 lb (98.9 kg)  01/21/22 221 lb 6.4 oz (100.4 kg)      Physical Exam Constitutional:      Appearance: He is well-developed.  HENT:     Head: Normocephalic.     Right Ear: Hearing normal.     Left Ear: Hearing normal.     Nose: Mucosal edema and congestion  present.     Mouth/Throat:     Pharynx: Posterior oropharyngeal erythema present.  Neck:     Thyroid: No thyroid mass or thyromegaly.     Vascular: No carotid bruit.     Trachea: Trachea normal.  Cardiovascular:     Rate and Rhythm: Normal rate and regular rhythm.     Pulses: Normal pulses.     Heart sounds: Heart sounds not distant. No murmur heard.    No friction rub. No gallop.     Comments: No peripheral edema Pulmonary:     Effort: Pulmonary effort is normal. No respiratory distress.     Breath sounds: Normal breath sounds.  Skin:    General: Skin is warm and dry.     Findings: No rash.  Psychiatric:        Speech: Speech normal.        Behavior: Behavior normal.        Thought Content: Thought content normal.       Results for orders placed or performed in visit on 08/01/21  Comprehensive metabolic panel  Result Value Ref Range   Sodium 140 135 - 145 mEq/L   Potassium 4.2 3.5 - 5.1 mEq/L   Chloride 107 96 - 112 mEq/L   CO2 26 19 - 32 mEq/L   Glucose, Bld 86 70 - 99 mg/dL   BUN 14 6 - 23 mg/dL   Creatinine, Ser 1.27 0.40 - 1.50 mg/dL   Total Bilirubin 0.8 0.2 - 1.2 mg/dL   Alkaline Phosphatase 62 39 - 117 U/L   AST 18 0 - 37 U/L   ALT 15 0 - 53 U/L   Total Protein 6.8 6.0 - 8.3 g/dL   Albumin 4.3 3.5 - 5.2 g/dL   GFR 53.41 (L) >60.00 mL/min   Calcium 9.4 8.4 - 10.5 mg/dL  Lipid panel  Result Value Ref Range   Cholesterol 124 0 - 200 mg/dL   Triglycerides 94.0 0.0 - 149.0 mg/dL   HDL 39.60 >39.00 mg/dL   VLDL 18.8 0.0 - 40.0 mg/dL   LDL Cholesterol 66 0 - 99 mg/dL   Total CHOL/HDL Ratio 3    NonHDL 84.74      COVID 19 screen:  No recent travel or known exposure to COVID19 The patient denies respiratory symptoms of COVID 19 at this time. The importance of social distancing was discussed today.   Assessment and Plan    Problem List Items Addressed This Visit     COVID-19 - Primary    COVID19  Infection < 5 days from onset of symptoms in  vaccinated  overweight individual with history of CAD, HTN  No  clear sign of bacterial infection at this time.   No SOB.  No red flags/need for ER visit or in-person exam at respiratory clinic at this time..    Pt higher risk for COVID complications given  HTN, CAD and age.  medication contraindications to paxlovid: amlodipine  Start molnupiravir 5 day course. Reviewed course of medication and side effect profile with patient in detail.   Symptomatic care with mucinex and cough suppressant at night. If SOB begins symptoms worsening.. have low threshold for in-person exam, if severe shortness of breath ER visit recommended.  Can monitor Oxygen saturation at home with home monitor if able to obtain.  Go to ER if O2 sat < 90% on room air.   Reviewed home care and provided information through [place].  Recommended quarantine 5 days isolation recommended. Return to work day 6 and wear mask for 4 more days to complete 10 days. Provided info about prevention of spread of COVID 19.       Relevant Medications   molnupiravir EUA (LAGEVRIO) 200 mg CAPS capsule     Eliezer Lofts, MD

## 2022-07-27 ENCOUNTER — Telehealth: Payer: Self-pay | Admitting: Family Medicine

## 2022-07-27 DIAGNOSIS — E7800 Pure hypercholesterolemia, unspecified: Secondary | ICD-10-CM

## 2022-07-27 NOTE — Telephone Encounter (Signed)
-----   Message from Velna Hatchet, RT sent at 07/20/2022 10:20 AM EST ----- Regarding: Thu 12/14 lab Patient is scheduled for cpx, please order future labs.  Thanks, Anda Kraft

## 2022-08-04 DIAGNOSIS — H532 Diplopia: Secondary | ICD-10-CM | POA: Diagnosis not present

## 2022-08-04 DIAGNOSIS — H5021 Vertical strabismus, right eye: Secondary | ICD-10-CM | POA: Diagnosis not present

## 2022-08-06 ENCOUNTER — Other Ambulatory Visit (INDEPENDENT_AMBULATORY_CARE_PROVIDER_SITE_OTHER): Payer: Medicare HMO

## 2022-08-06 DIAGNOSIS — E7800 Pure hypercholesterolemia, unspecified: Secondary | ICD-10-CM

## 2022-08-06 LAB — LIPID PANEL
Cholesterol: 135 mg/dL (ref 0–200)
HDL: 43.6 mg/dL (ref >39.00)
LDL Cholesterol: 73 mg/dL (ref 0–99)
NonHDL: 91.06
Total CHOL/HDL Ratio: 3
Triglycerides: 91 mg/dL (ref 0.0–149.0)
VLDL: 18.2 mg/dL (ref 0.0–40.0)

## 2022-08-06 LAB — HEMOGLOBIN A1C: Hgb A1c MFr Bld: 5.8 % (ref 4.6–6.5)

## 2022-08-06 LAB — COMPREHENSIVE METABOLIC PANEL
ALT: 14 U/L (ref 0–53)
AST: 17 U/L (ref 0–37)
Albumin: 4.6 g/dL (ref 3.5–5.2)
Alkaline Phosphatase: 84 U/L (ref 39–117)
BUN: 17 mg/dL (ref 6–23)
CO2: 28 mEq/L (ref 19–32)
Calcium: 9.6 mg/dL (ref 8.4–10.5)
Chloride: 106 mEq/L (ref 96–112)
Creatinine, Ser: 1.17 mg/dL (ref 0.40–1.50)
GFR: 58.52 mL/min — ABNORMAL LOW (ref >60.00)
Glucose, Bld: 92 mg/dL (ref 70–99)
Potassium: 4.3 mEq/L (ref 3.5–5.1)
Sodium: 141 mEq/L (ref 135–145)
Total Bilirubin: 0.7 mg/dL (ref 0.2–1.2)
Total Protein: 7 g/dL (ref 6.0–8.3)

## 2022-08-06 NOTE — Progress Notes (Signed)
No critical labs need to be addressed urgently. We will discuss labs in detail at upcoming office visit.   

## 2022-08-10 ENCOUNTER — Ambulatory Visit (INDEPENDENT_AMBULATORY_CARE_PROVIDER_SITE_OTHER): Payer: Medicare HMO

## 2022-08-10 VITALS — Ht 69.0 in | Wt 215.0 lb

## 2022-08-10 DIAGNOSIS — Z0000 Encounter for general adult medical examination without abnormal findings: Secondary | ICD-10-CM | POA: Diagnosis not present

## 2022-08-10 NOTE — Progress Notes (Signed)
I connected with Jack Norris today by telephone and verified that I am speaking with the correct person using two identifiers. Location patient: home Location provider: work Persons participating in the virtual visit: Kaan, Tosh LPN.   I discussed the limitations, risks, security and privacy concerns of performing an evaluation and management service by telephone and the availability of in person appointments. I also discussed with the patient that there may be a patient responsible charge related to this service. The patient expressed understanding and verbally consented to this telephonic visit.    Interactive audio and video telecommunications were attempted between this provider and patient, however failed, due to patient having technical difficulties OR patient did not have access to video capability.  We continued and completed visit with audio only.     Vital signs may be patient reported or missing.  Subjective:   Jack Norris is a 81 y.o. male who presents for Medicare Annual/Subsequent preventive examination.  Review of Systems     Cardiac Risk Factors include: advanced age (>70mn, >>3women);hypertension;male gender;obesity (BMI >30kg/m2)     Objective:    Today's Vitals   08/10/22 1511  Weight: 215 lb (97.5 kg)  Height: '5\' 9"'$  (1.753 m)   Body mass index is 31.75 kg/m.     08/10/2022    3:15 PM 07/11/2021   12:24 PM 07/10/2020    2:50 PM 06/15/2019   12:16 PM 04/22/2018    9:49 AM 04/08/2018    9:50 AM 04/20/2017   11:10 AM  Advanced Directives  Does Patient Have a Medical Advance Directive? Yes No No No No No No  Type of [place]        Copy of [place]? No - copy requested        Would patient like information on creating a medical advance directive?  Yes (MAU/Ambulatory/Procedural Areas - Information given) No - Patient declined No - Patient declined  No - Patient declined No - Patient declined     Current Medications (verified) Outpatient Encounter Medications as of 08/10/2022  Medication Sig   albuterol (VENTOLIN HFA) 108 (90 Base) MCG/ACT inhaler Inhale 2 puffs into the lungs every 6 (six) hours as needed for wheezing or shortness of breath.   amLODipine (NORVASC) 10 MG tablet TAKE 1 TABLET BY MOUTH EVERY DAY   aspirin 81 MG tablet Take 81 mg by mouth daily.   B-D 3CC LUER-LOK SYR 18GX1-1/2 18G X 1-1/2" 3 ML MISC USE ONCE A WEEK AS DIRECTED   fenofibrate micronized (LOFIBRA) 67 MG capsule TAKE 1 CAPSULE(67 MG) BY MOUTH DAILY BEFORE BREAKFAST   lisinopril (ZESTRIL) 40 MG tablet TAKE 1 TABLET(40 MG) BY MOUTH DAILY   omeprazole (PRILOSEC) 20 MG capsule Take 20 mg by mouth daily. Per patient OTC   psyllium (METAMUCIL) 58.6 % powder Take 1 packet by mouth daily.   rosuvastatin (CRESTOR) 5 MG tablet TAKE 1 TABLET(5 MG) BY MOUTH DAILY   testosterone cypionate (DEPOTESTOSTERONE CYPIONATE) 200 MG/ML injection Inject 0.5 mLs (100 mg total) into the muscle every 7 (seven) days.   triamcinolone (KENALOG) 0.025 % cream Apply 1 application topically as needed.   No facility-administered encounter medications on file as of 08/10/2022.    Allergies (verified) Paroxetine, Morphine and related, Ciprofloxacin, and Flagyl [metronidazole hcl]   History: Past Medical History:  Diagnosis Date   Asthma, mild    as child-only now if has URI   BPH (benign  prostatic hypertrophy)    BPPV (benign paroxysmal positional vertigo)    Diverticulosis    Eczema    Fatty liver disease, nonalcoholic    Frequent nosebleeds    History of   GERD (gastroesophageal reflux disease)    occ uses TUMS as needed   Glaucoma    History of bronchitis    History of colon polyps    History of diverticulitis    History of kidney stones    Hyperlipemia    Hypertension    Influenza A 11/2017   Memory changes    OA (osteoarthritis)    Obesity    Overactive bladder  2020   PVCs (premature ventricular contractions)    Renal calculi right    AND LEFT X1 NON-OBSTRUCTIVE   Right sided sciatica    Bilateral buttock and thighs   Simple renal cyst right interpoler, simpler in nature , asymptomatic  per urologist note (dr dahlstedt)  12-26-2011   BENIGN, has been drained   Testosterone deficiency    Wears glasses    Wears hearing aid    Past Surgical History:  Procedure Laterality Date   APPENDECTOMY     bone spur removal     BILATERAL GREAT TOE   COLONOSCOPY     CYSTO/ RIGHT RETROGRADE PYELOGRAM/ RIGHT URETER DILATATION/ RIGHT URETEROSCOPY/ LASER LITHOTRIPSY RIGHT RENAL PELVIS AND LOWER POLE CALCULI/ PLACEMENT STENT  01-11-2012  DR DAHLSTEDT   RIGHT RENAL PELVIS AND LOWER POLE CALCULI   CYSTOSCOPY W/ RETROGRADES  06/02/2012   Procedure: CYSTOSCOPY WITH RETROGRADE PYELOGRAM;  Surgeon: Franchot Gallo, MD;  Location: [facility];  Service: Urology;  Laterality: Right;   CYSTOSCOPY WITH URETEROSCOPY  06/02/2012   Procedure: CYSTOSCOPY WITH URETEROSCOPY;  Surgeon: Franchot Gallo, MD;  Location: [facility];  Service: Urology;  Laterality: Right;  90 MIN ALSO EXTRACTION OF CALCULI    EYE SURGERY Bilateral 2020   LITHOTRIPSY  APPROX.  1998   ESWL  left kidney x 2   ROTATOR CUFF REPAIR  2012   LEFT SHOULDER   SHOULDER ARTHROSCOPY WITH ROTATOR CUFF REPAIR AND SUBACROMIAL DECOMPRESSION  09/12/2012   Procedure: SHOULDER ARTHROSCOPY WITH ROTATOR CUFF REPAIR AND SUBACROMIAL DECOMPRESSION;  Surgeon: Nita Sells, MD;  Location: [place];  Service: Orthopedics;  Laterality: Right;  see above and biceps tendonotomy   SKIN LESION EXCISION Right 07/07/2021   right side of face near right ear   TRANSURETHRAL RESECTION OF BLADDER NECK N/A 04/08/2018   Procedure: TRANSURETHRAL RESECTION OF BLADDER NECK CONTRACTURE;  Surgeon: Franchot Gallo, MD;  Location: [facility];  Service: Urology;   Laterality: N/A;   TRANSURETHRAL RESECTION OF PROSTATE  11/29/2009   BPH   VASECTOMY  1970'S   GEN. ANES.   Family History  Problem Relation Age of Onset   Cancer Mother        breast and kidney    Squamous cell carcinoma Mother    Cancer Father        lung   Heart disease Father    Social History   Socioeconomic History   Marital status: Married    Spouse name: Not on file   Number of children: 3   Years of education: college   Highest education level: Bachelor's degree (e.g., BA, AB, BS)  Occupational History   Occupation: QUOTATION ENGINEER    Employer: [employer]  Tobacco Use   Smoking status: Former    Years: 8.00    Types: Cigarettes  Quit date: 01/05/1967    Years since quitting: 55.6   Smokeless tobacco: Never   Tobacco comments:    QUIT AGE 55  Vaping Use   Vaping Use: Never used  Substance and Sexual Activity   Alcohol use: Yes    Alcohol/week: 0.0 standard drinks of alcohol    Comment: OCCASIONAL   Drug use: No   Sexual activity: Yes  Other Topics Concern   Not on file  Social History Narrative   regular exercise    Full code, (Reviewed 2014)      Lives at home with his wife.   Right-handed.   Caffeine use: 4 cups per day.   Social Determinants of Health   Financial Resource Strain: Low Risk  (08/10/2022)   Overall Financial Resource Strain (CARDIA)    Difficulty of Paying Living Expenses: Not hard at all  Food Insecurity: No Food Insecurity (08/10/2022)   Hunger Vital Sign    Worried About Running Out of Food in the Last Year: Never true    Ran Out of Food in the Last Year: Never true  Transportation Needs: No Transportation Needs (08/10/2022)   PRAPARE - Hydrologist (Medical): No    Lack of Transportation (Non-Medical): No  Physical Activity: Insufficiently Active (08/10/2022)   Exercise Vital Sign    Days of Exercise per Week: 2 days    Minutes of Exercise per Session: 30 min  Stress: No Stress  Concern Present (08/10/2022)   [place]    Feeling of Stress : Not at all  Social Connections: [place] (07/11/2021)   Social Connection and Isolation Panel [NHANES]    Frequency of Communication with Friends and Family: More than three times a week    Frequency of Social Gatherings with Friends and Family: Twice a week    Attends Religious Services: More than 4 times per year    Active Member of [employer] or Organizations: Yes    Attends Archivist Meetings: Never    Marital Status: Married    Tobacco Counseling Counseling given: Not Answered Tobacco comments: QUIT AGE 55   Clinical Intake:  Pre-visit preparation completed: Yes  Pain : No/denies pain     Nutritional Status: BMI > 30  Obese Nutritional Risks: None Diabetes: No  How often do you need to have someone help you when you read instructions, pamphlets, or other written materials from your doctor or pharmacy?: 1 - Never  Diabetic? no  Interpreter Needed?: No  Information entered by :: NAllen LPN   Activities of Daily Living    08/10/2022    3:16 PM  In your present state of health, do you have any difficulty performing the following activities:  Hearing? 1  Comment has hearing aids  Vision? 0  Difficulty concentrating or making decisions? 0  Walking or climbing stairs? 0  Dressing or bathing? 0  Doing errands, shopping? 0  Preparing Food and eating ? N  Using the Toilet? N  In the past six months, have you accidently leaked urine? Y  Do you have problems with loss of bowel control? N  Managing your Medications? N  Managing your Finances? N  Housekeeping or managing your Housekeeping? N    Patient Care Team: Jinny Sanders, MD as PCP - General Jettie Booze, MD as PCP - Cardiology (Cardiology) Clent Jacks, MD as Consulting Physician (Ophthalmology) Franchot Gallo, MD as Consulting Physician  (Urology) Ree Edman,  MD as Consulting Physician (Dermatology) Charlton Haws, [facility] as Pharmacist (Pharmacist)  Indicate any recent Medical Services you may have received from other than [facility] providers in the past year (date may be approximate).     Assessment:   This is a routine wellness examination for [place].  Hearing/Vision screen Vision Screening - Comments:: Regular eye exams, [facility]  Dietary issues and exercise activities discussed: Current Exercise Habits: Home exercise routine, Type of exercise: strength training/weights, Time (Minutes): 30, Frequency (Times/Week): 2, Weekly Exercise (Minutes/Week): 60   Goals Addressed             This Visit's Progress    Patient Stated       08/10/2022, wants to lose weight       Depression Screen    08/10/2022    3:16 PM 07/11/2021   12:29 PM 07/10/2020    2:53 PM 06/15/2019   12:19 PM 04/22/2018    9:48 AM 04/20/2017   10:12 AM 04/16/2016    1:14 PM  PHQ 2/9 Scores  PHQ - 2 Score 0 0 0 0 0 0 0  PHQ- 9 Score   0 0 0      Fall Risk    08/10/2022    3:16 PM 07/11/2021   12:27 PM 07/10/2020    2:52 PM 06/15/2019   12:19 PM 04/22/2018    9:48 AM  [place] in the past year? 0 0 0 0 No  Number falls in past yr: 0 0 0 0   Injury with Fall? 0 0 0 0   Risk for fall due to : Medication side effect No Fall Risks  Medication side effect;Impaired balance/gait   Follow up Falls prevention discussed;Education provided;Falls evaluation completed Falls prevention discussed Falls evaluation completed;Falls prevention discussed Falls evaluation completed;Falls prevention discussed     FALL RISK PREVENTION PERTAINING TO THE HOME:  Any stairs in or around the home? Yes  If so, are there any without handrails? N/a Home free of loose throw rugs in walkways, pet beds, electrical cords, etc? Yes  Adequate lighting in your home to reduce risk of falls? Yes   ASSISTIVE DEVICES UTILIZED TO PREVENT  FALLS:  Life alert? No  Use of a cane, walker or w/c? No  Grab bars in the bathroom? Yes  Shower chair or bench in shower? No  Elevated toilet seat or a handicapped toilet? No   TIMED UP AND GO:  Was the test performed? No .      Cognitive Function:    07/10/2020    2:56 PM 07/04/2019   11:26 AM 06/15/2019   12:23 PM 04/22/2018    9:49 AM 04/20/2017   11:10 AM  MMSE - Mini Mental State Exam  Orientation to time '5 5 5 5 5  '$ Orientation to Place '5 5 5 5 5  '$ Registration '3 3 3 3 3  '$ Attention/ Calculation '5 5 5 '$ 0 0  Recall '3 2 3 3 3  '$ Language- name 2 objects  2  0 0  Language- repeat '1 1 1 1 1  '$ Language- follow 3 step command  '3  3 3  '$ Language- read & follow direction  1  0 0  Write a sentence  1  0 0  Copy design  1  0 0  Total score  '29  20 20        '$ 08/10/2022    3:18 PM  6CIT Screen  What Year? 0 points  What month? 0 points  What time? 0 points  Count back from 20 0 points  Months in reverse 0 points  Repeat phrase 0 points  Total Score 0 points    Immunizations Immunization History  Administered Date(s) Administered   Fluad Quad(high Dose 65+) 06/16/2019, 08/02/2020, 08/08/2021, 05/05/2022   Influenza Split 06/26/2011, 07/11/2012   Influenza Whole 05/24/2008, 06/24/2008, 07/04/2009, 05/24/2010   Influenza, High Dose Seasonal PF 06/14/2018   Influenza,inj,Quad PF,6+ Mos 07/26/2013, 06/12/2014, 04/15/2015, 04/16/2016, 07/01/2017   Influenza-Unspecified 08/08/2021   PFIZER(Purple Top)SARS-COV-2 Vaccination 10/04/2019, 10/25/2019, 06/07/2020, 03/10/2021   Pneumococcal Conjugate-13 04/15/2015   Pneumococcal Polysaccharide-23 09/14/2007   Td 08/02/2007   Tdap 12/15/2021   Zoster Recombinat (Shingrix) 01/13/2022, 04/01/2022   Zoster, Live 04/08/2009    TDAP status: Up to date  Flu Vaccine status: Up to date  Pneumococcal vaccine status: Up to date  Covid-19 vaccine status: Completed vaccines  Qualifies for Shingles Vaccine? Yes   Zostavax completed  Yes   Shingrix Completed?: Yes  Screening Tests Health Maintenance  Topic Date Due   COVID-19 Vaccine (5 - 2023-24 season) 04/24/2022   Medicare Annual Wellness (AWV)  07/11/2022   DTaP/Tdap/Td (3 - Td or Tdap) 12/16/2031   Pneumonia Vaccine 74+ Years old  Completed   INFLUENZA VACCINE  Completed   Zoster Vaccines- Shingrix  Completed   HPV VACCINES  Aged Out    Health Maintenance  Health Maintenance Due  Topic Date Due   COVID-19 Vaccine (5 - 2023-24 season) 04/24/2022   Medicare Annual Wellness (AWV)  07/11/2022    Colorectal cancer screening: No longer required.   Lung Cancer Screening: (Low Dose CT Chest recommended if Age 76-80 years, 30 pack-year currently smoking OR have quit w/in 15years.) does not qualify.   Lung Cancer Screening Referral: no  Additional Screening:  Hepatitis C Screening: does not qualify;   Vision Screening: Recommended annual ophthalmology exams for early detection of glaucoma and other disorders of the eye. Is the patient up to date with their annual eye exam?  Yes  Who is the provider or what is the name of the office in which the patient attends annual eye exams? [facility] If pt is not established with a provider, would they like to be referred to a provider to establish care? No .   Dental Screening: Recommended annual dental exams for proper oral hygiene  Community Resource Referral / Chronic Care Management: CRR required this visit?  No   CCM required this visit?  No      Plan:     I have personally reviewed and noted the following in the patient's chart:   Medical and social history Use of alcohol, tobacco or illicit drugs  Current medications and supplements including opioid prescriptions. Patient is not currently taking opioid prescriptions. Functional ability and status Nutritional status Physical activity Advanced directives List of other physicians Hospitalizations, surgeries, and ER visits in previous 12  months Vitals Screenings to include cognitive, depression, and falls Referrals and appointments  In addition, I have reviewed and discussed with patient certain preventive protocols, quality metrics, and best practice recommendations. A written personalized care plan for preventive services as well as general preventive health recommendations were provided to patient.     Kellie Simmering, LPN   32/07/2481   Nurse Notes: none  Due to this being a virtual visit, the after visit summary with patients personalized plan was offered to patient via mail or my-chart.  Patient would like to access on  my-chart

## 2022-08-10 NOTE — Patient Instructions (Signed)
Jack Norris , Thank you for taking time to come for your Medicare Wellness Visit. I appreciate your ongoing commitment to your health goals. Please review the following plan we discussed and let me know if I can assist you in the future.   These are the goals we discussed:  Goals      Increase physical activity     Starting 04/22/2018, I will continue to walk at least 60 minutes 2-3 days per week.      Manage My Medicine     Timeframe:  Long-Range Goal Priority:  Medium Start Date:    10/16/21                         Expected End Date:     10/16/22                  Follow Up Date Feb 2024   - call for medicine refill 2 or 3 days before it runs out - call if I am sick and can't take my medicine - keep a list of all the medicines I take; vitamins and herbals too    Why is this important?   These steps will help you keep on track with your medicines.   Notes:      Patient Stated     06/15/2019, I will try to start back walking 2-3 miles daily.      Patient Stated     07/10/2020, I will continue to do exercises with my personal trainer 3 days a week for 1 hour.      Patient Stated     Would like to get back to walking.      Patient Stated     08/10/2022, wants to lose weight        This is a list of the screening recommended for you and due dates:  Health Maintenance  Topic Date Due   COVID-19 Vaccine (5 - 2023-24 season) 04/24/2022   Medicare Annual Wellness Visit  08/11/2023   DTaP/Tdap/Td vaccine (3 - Td or Tdap) 12/16/2031   Pneumonia Vaccine  Completed   Flu Shot  Completed   Zoster (Shingles) Vaccine  Completed   HPV Vaccine  Aged Out    Advanced directives: Please bring a copy of your POA (Power of [place]) and/or Living Will to your next appointment.   Conditions/risks identified: none  Next appointment: Follow up in one year for your annual wellness visit.   Preventive Care 34 Years and Older, Male  Preventive care refers to lifestyle choices and  visits with your health care provider that can promote health and wellness. What does preventive care include? A yearly physical exam. This is also called an annual well check. Dental exams once or twice a year. Routine eye exams. Ask your health care provider how often you should have your eyes checked. Personal lifestyle choices, including: Daily care of your teeth and gums. Regular physical activity. Eating a healthy diet. Avoiding tobacco and drug use. Limiting alcohol use. Practicing safe sex. Taking low doses of aspirin every day. Taking vitamin and mineral supplements as recommended by your health care provider. What happens during an annual well check? The services and screenings done by your health care provider during your annual well check will depend on your age, overall health, lifestyle risk factors, and family history of disease. Counseling  Your health care provider may ask you questions about your: Alcohol use. Tobacco use. Drug use.  Emotional well-being. Home and relationship well-being. Sexual activity. Eating habits. History of falls. Memory and ability to understand (cognition). Work and work Statistician. Screening  You may have the following tests or measurements: Height, weight, and BMI. Blood pressure. Lipid and cholesterol levels. These may be checked every 5 years, or more frequently if you are over 65 years old. Skin check. Lung cancer screening. You may have this screening every year starting at age 85 if you have a 30-pack-year history of smoking and currently smoke or have quit within the past 15 years. Fecal occult blood test (FOBT) of the stool. You may have this test every year starting at age 66. Flexible sigmoidoscopy or colonoscopy. You may have a sigmoidoscopy every 5 years or a colonoscopy every 10 years starting at age 41. Prostate cancer screening. Recommendations will vary depending on your family history and other risks. Hepatitis C  blood test. Hepatitis B blood test. Sexually transmitted disease (STD) testing. Diabetes screening. This is done by checking your blood sugar (glucose) after you have not eaten for a while (fasting). You may have this done every 1-3 years. Abdominal aortic aneurysm (AAA) screening. You may need this if you are a current or former smoker. Osteoporosis. You may be screened starting at age 66 if you are at high risk. Talk with your health care provider about your test results, treatment options, and if necessary, the need for more tests. Vaccines  Your health care provider may recommend certain vaccines, such as: Influenza vaccine. This is recommended every year. Tetanus, diphtheria, and acellular pertussis (Tdap, Td) vaccine. You may need a Td booster every 10 years. Zoster vaccine. You may need this after age 38. Pneumococcal 13-valent conjugate (PCV13) vaccine. One dose is recommended after age 65. Pneumococcal polysaccharide (PPSV23) vaccine. One dose is recommended after age 27. Talk to your health care provider about which screenings and vaccines you need and how often you need them. This information is not intended to replace advice given to you by your health care provider. Make sure you discuss any questions you have with your health care provider. Document Released: 09/06/2015 Document Revised: 04/29/2016 Document Reviewed: 06/11/2015 Elsevier Interactive Patient Education  2017 [employer] Prevention in the Home Falls can cause injuries. They can happen to people of all ages. There are many things you can do to make your home safe and to help prevent falls. What can I do on the outside of my home? Regularly fix the edges of walkways and driveways and fix any cracks. Remove anything that might make you trip as you walk through a door, such as a raised step or threshold. Trim any bushes or trees on the path to your home. Use bright outdoor lighting. Clear any walking paths of  anything that might make someone trip, such as rocks or tools. Regularly check to see if handrails are loose or broken. Make sure that both sides of any steps have handrails. Any raised decks and porches should have guardrails on the edges. Have any leaves, snow, or ice cleared regularly. Use sand or salt on walking paths during winter. Clean up any spills in your garage right away. This includes oil or grease spills. What can I do in the bathroom? Use night lights. Install grab bars by the toilet and in the tub and shower. Do not use towel bars as grab bars. Use non-skid mats or decals in the tub or shower. If you need to sit down in the shower, use a  plastic, non-slip stool. Keep the floor dry. Clean up any water that spills on the floor as soon as it happens. Remove soap buildup in the tub or shower regularly. Attach bath mats securely with double-sided non-slip rug tape. Do not have throw rugs and other things on the floor that can make you trip. What can I do in the bedroom? Use night lights. Make sure that you have a light by your bed that is easy to reach. Do not use any sheets or blankets that are too big for your bed. They should not hang down onto the floor. Have a firm chair that has side arms. You can use this for support while you get dressed. Do not have throw rugs and other things on the floor that can make you trip. What can I do in the kitchen? Clean up any spills right away. Avoid walking on wet floors. Keep items that you use a lot in easy-to-reach places. If you need to reach something above you, use a strong step stool that has a grab bar. Keep electrical cords out of the way. Do not use floor polish or wax that makes floors slippery. If you must use wax, use non-skid floor wax. Do not have throw rugs and other things on the floor that can make you trip. What can I do with my stairs? Do not leave any items on the stairs. Make sure that there are handrails on both  sides of the stairs and use them. Fix handrails that are broken or loose. Make sure that handrails are as long as the stairways. Check any carpeting to make sure that it is firmly attached to the stairs. Fix any carpet that is loose or worn. Avoid having throw rugs at the top or bottom of the stairs. If you do have throw rugs, attach them to the floor with carpet tape. Make sure that you have a light switch at the top of the stairs and the bottom of the stairs. If you do not have them, ask someone to add them for you. What else can I do to help prevent falls? Wear shoes that: Do not have high heels. Have rubber bottoms. Are comfortable and fit you well. Are closed at the toe. Do not wear sandals. If you use a stepladder: Make sure that it is fully opened. Do not climb a closed stepladder. Make sure that both sides of the stepladder are locked into place. Ask someone to hold it for you, if possible. Clearly mark and make sure that you can see: Any grab bars or handrails. First and last steps. Where the edge of each step is. Use tools that help you move around (mobility aids) if they are needed. These include: Canes. Walkers. Scooters. Crutches. Turn on the lights when you go into a dark area. Replace any light bulbs as soon as they burn out. Set up your furniture so you have a clear path. Avoid moving your furniture around. If any of your floors are uneven, fix them. If there are any pets around you, be aware of where they are. Review your medicines with your doctor. Some medicines can make you feel dizzy. This can increase your chance of falling. Ask your doctor what other things that you can do to help prevent falls. This information is not intended to replace advice given to you by your health care provider. Make sure you discuss any questions you have with your health care provider. Document Released: 06/06/2009 Document Revised: 01/16/2016 Document  Reviewed: 09/14/2014 Elsevier  Interactive Patient Education  2017 [employer].

## 2022-08-13 ENCOUNTER — Encounter: Payer: Self-pay | Admitting: Family Medicine

## 2022-08-13 ENCOUNTER — Ambulatory Visit (INDEPENDENT_AMBULATORY_CARE_PROVIDER_SITE_OTHER): Payer: Medicare HMO | Admitting: Family Medicine

## 2022-08-13 VITALS — BP 122/66 | HR 78 | Temp 98.6°F | Ht 69.0 in | Wt 219.1 lb

## 2022-08-13 DIAGNOSIS — Z0000 Encounter for general adult medical examination without abnormal findings: Secondary | ICD-10-CM

## 2022-08-13 DIAGNOSIS — I10 Essential (primary) hypertension: Secondary | ICD-10-CM

## 2022-08-13 DIAGNOSIS — E7800 Pure hypercholesterolemia, unspecified: Secondary | ICD-10-CM | POA: Diagnosis not present

## 2022-08-13 DIAGNOSIS — I700 Atherosclerosis of aorta: Secondary | ICD-10-CM

## 2022-08-13 NOTE — Assessment & Plan Note (Signed)
Noted incidentally on CT, on statin

## 2022-08-13 NOTE — Assessment & Plan Note (Signed)
Stable, chronic.  Continue current medication.      At goal LDL < 70 on rosuvastatin and fenofibrate

## 2022-08-13 NOTE — Progress Notes (Signed)
Patient ID: Jack Norris, male    DOB: 05-16-1941, 81 y.o.   MRN: [id number]  This visit was conducted in person.  BP (!) 140/60   Pulse 78   Temp 98.6 F (37 C) (Oral)   Ht '5\' 9"'$  (1.753 m)   Wt 219 lb 2 oz (99.4 kg)   SpO2 96%   BMI 32.36 kg/m    CC:  Chief Complaint  Patient presents with   Annual Exam    Part 2    Subjective:   HPI: [name] is a 81 y.o. male presenting on 08/13/2022 for Annual Exam (Part 2)  The patient presents for  complete physical and review of chronic health problems. He/She also has the following acute concerns today: none   OVer COVID only mild cough, no fatigue, SOB or CP.  Hypertension:  Borderline control today in office despite lisinopril 40 mg daily, amlodipine 10 mg daily BP Readings from Last 3 Encounters:  08/13/22 (!) 140/60  07/21/22 118/66  04/14/22 124/70  Using medication without problems or lightheadedness:  none Chest pain with exertion:none Edema: mild Short of breath:none Average home BPs: Other issues:  Elevated Cholesterol: LDL at goal less than 70 given coronary artery disease on rosuvastatin 5 mg p.o. daily and fenofibrate 67 mg daily.   Lab Results  Component Value Date   CHOL 135 08/06/2022   HDL 43.60 08/06/2022   LDLCALC 73 08/06/2022   LDLDIRECT 107.6 12/14/2011   TRIG 91.0 08/06/2022   CHOLHDL 3 08/06/2022  Using medications without problems: Muscle aches:  Diet compliance: moderate Exercise: try to stay active Other complaints: Hx of CAD    Mild cognitive impairment: followed by  Dr. Krista Blue in past. MRI of the brain  05/2022: IMPRESSION: 1. No retrocochlear lesion identified. 2. Mild chronic microvascular ischemic changes of the white matter.     Laboratory showed normal TSH, B12, no treatable etiology identified Recommended exercise no other treatment at this time.   He reports that it is stable in last year.  Relevant past medical, surgical, family and social history reviewed  and updated as indicated. Interim medical history since our last visit reviewed. Allergies and medications reviewed and updated. Outpatient Medications Prior to Visit  Medication Sig Dispense Refill   albuterol (VENTOLIN HFA) 108 (90 Base) MCG/ACT inhaler Inhale 2 puffs into the lungs every 6 (six) hours as needed for wheezing or shortness of breath. 8 g 2   amLODipine (NORVASC) 10 MG tablet TAKE 1 TABLET BY MOUTH EVERY DAY 90 tablet 3   aspirin 81 MG tablet Take 81 mg by mouth daily.     B-D 3CC LUER-LOK SYR 18GX1-1/2 18G X 1-1/2" 3 ML MISC USE ONCE A WEEK AS DIRECTED  2   fenofibrate micronized (LOFIBRA) 67 MG capsule TAKE 1 CAPSULE(67 MG) BY MOUTH DAILY BEFORE BREAKFAST 90 capsule 3   lisinopril (ZESTRIL) 40 MG tablet TAKE 1 TABLET(40 MG) BY MOUTH DAILY 90 tablet 3   omeprazole (PRILOSEC) 20 MG capsule Take 20 mg by mouth daily. Per patient OTC     psyllium (METAMUCIL) 58.6 % powder Take 1 packet by mouth daily.     rosuvastatin (CRESTOR) 5 MG tablet TAKE 1 TABLET(5 MG) BY MOUTH DAILY 90 tablet 3   testosterone cypionate (DEPOTESTOSTERONE CYPIONATE) 200 MG/ML injection Inject 0.5 mLs (100 mg total) into the muscle every 7 (seven) days. 10 mL 0   triamcinolone (KENALOG) 0.025 % cream Apply 1 application topically as needed.  No facility-administered medications prior to visit.     Per HPI unless specifically indicated in ROS section below Review of Systems  Constitutional:  Negative for fatigue and fever.  HENT:  Negative for ear pain.   Eyes:  Negative for pain.  Respiratory:  Negative for cough and shortness of breath.   Cardiovascular:  Negative for chest pain, palpitations and leg swelling.  Gastrointestinal:  Negative for abdominal pain.  Genitourinary:  Negative for dysuria.  Musculoskeletal:  Negative for arthralgias.  Neurological:  Negative for syncope, light-headedness and headaches.  Psychiatric/Behavioral:  Negative for dysphoric mood.    Objective:  BP (!) 140/60    Pulse 78   Temp 98.6 F (37 C) (Oral)   Ht '5\' 9"'$  (1.753 m)   Wt 219 lb 2 oz (99.4 kg)   SpO2 96%   BMI 32.36 kg/m   Wt Readings from Last 3 Encounters:  08/13/22 219 lb 2 oz (99.4 kg)  08/10/22 215 lb (97.5 kg)  07/21/22 222 lb 6 oz (100.9 kg)      Physical Exam Constitutional:      General: He is not in acute distress.    Appearance: Normal appearance. He is well-developed. He is obese. He is not ill-appearing or toxic-appearing.  HENT:     Head: Normocephalic and atraumatic.     Right Ear: Hearing, tympanic membrane, ear canal and external ear normal.     Left Ear: Hearing, tympanic membrane, ear canal and external ear normal.     Nose: Nose normal.     Mouth/Throat:     Pharynx: Uvula midline.  Eyes:     General: Lids are normal. Lids are everted, no foreign bodies appreciated.     Conjunctiva/sclera: Conjunctivae normal.     Pupils: Pupils are equal, round, and reactive to light.  Neck:     Thyroid: No thyroid mass or thyromegaly.     Vascular: No carotid bruit.     Trachea: Trachea and phonation normal.  Cardiovascular:     Rate and Rhythm: Normal rate and regular rhythm.     Pulses: Normal pulses.     Heart sounds: S1 normal and S2 normal. No murmur heard.    No gallop.  Pulmonary:     Breath sounds: Normal breath sounds. No wheezing, rhonchi or rales.  Abdominal:     General: Bowel sounds are normal.     Palpations: Abdomen is soft.     Tenderness: There is no abdominal tenderness. There is no guarding or rebound.     Hernia: No hernia is present.  Musculoskeletal:     Cervical back: Normal range of motion and neck supple.  Lymphadenopathy:     Cervical: No cervical adenopathy.  Skin:    General: Skin is warm and dry.     Findings: No rash.  Neurological:     Mental Status: He is alert.     Cranial Nerves: No cranial nerve deficit.     Sensory: No sensory deficit.     Gait: Gait normal.     Deep Tendon Reflexes: Reflexes are normal and symmetric.   Psychiatric:        Speech: Speech normal.        Behavior: Behavior normal.        Judgment: Judgment normal.       Results for orders placed or performed in visit on 08/06/22  Comprehensive metabolic panel  Result Value Ref Range   Sodium 141 135 - 145 mEq/L   Potassium  4.3 3.5 - 5.1 mEq/L   Chloride 106 96 - 112 mEq/L   CO2 28 19 - 32 mEq/L   Glucose, Bld 92 70 - 99 mg/dL   BUN 17 6 - 23 mg/dL   Creatinine, Ser 1.17 0.40 - 1.50 mg/dL   Total Bilirubin 0.7 0.2 - 1.2 mg/dL   Alkaline Phosphatase 84 39 - 117 U/L   AST 17 0 - 37 U/L   ALT 14 0 - 53 U/L   Total Protein 7.0 6.0 - 8.3 g/dL   Albumin 4.6 3.5 - 5.2 g/dL   GFR 58.52 (L) >60.00 mL/min   Calcium 9.6 8.4 - 10.5 mg/dL  Lipid panel  Result Value Ref Range   Cholesterol 135 0 - 200 mg/dL   Triglycerides 91.0 0.0 - 149.0 mg/dL   HDL 43.60 >39.00 mg/dL   VLDL 18.2 0.0 - 40.0 mg/dL   LDL Cholesterol 73 0 - 99 mg/dL   Total CHOL/HDL Ratio 3    NonHDL 91.06   Hemoglobin A1c  Result Value Ref Range   Hgb A1c MFr Bld 5.8 4.6 - 6.5 %     COVID 19 screen:  No recent travel or known exposure to COVID19 The patient denies respiratory symptoms of COVID 19 at this time. The importance of social distancing was discussed today.   Assessment and Plan   The patient's preventative maintenance and recommended screening tests for an annual wellness exam were reviewed in full today. Brought up to date unless services declined.  Counselled on the importance of diet, exercise, and its role in overall health and mortality. The patient's FH and SH was reviewed, including their home life, tobacco status, and drug and alcohol status.   Vaccines: Uptodate with flu, COVID x 3 , PNA, shingrix Colon: 05/2013, [place] nml, no further indicated.   Prostate: per uro. Stable PSA per  Dr. Diona Fanti . Former smoker, remotely.    Hep C  done.  Problem List Items Addressed This Visit     Benign essential hypertension (Chronic)    Stable,  chronic.  Continue current medication.   lisinopril 40 mg daily , amlodipine 5 mg daily       HYPERCHOLESTEROLEMIA (Chronic)    Stable, chronic.  Continue current medication.      At goal LDL < 70 on rosuvastatin and fenofibrate      Aortic atherosclerosis ([place])    Noted incidentally on CT, on statin      RESOLVED: Routine general medical examination at a health care facility - Primary     Eliezer Lofts, MD

## 2022-08-13 NOTE — Assessment & Plan Note (Signed)
Stable, chronic.  Continue current medication.   lisinopril 40 mg daily , amlodipine 5 mg daily  

## 2022-08-13 NOTE — Patient Instructions (Signed)
Start regular exercise to improve memory and improve heart health.

## 2022-09-27 ENCOUNTER — Telehealth: Payer: Medicare HMO | Admitting: Family Medicine

## 2022-09-27 DIAGNOSIS — R051 Acute cough: Secondary | ICD-10-CM

## 2022-09-28 ENCOUNTER — Encounter: Payer: Self-pay | Admitting: Family Medicine

## 2022-09-28 ENCOUNTER — Telehealth: Payer: Medicare HMO | Admitting: Family Medicine

## 2022-09-28 ENCOUNTER — Telehealth: Payer: Self-pay

## 2022-09-28 ENCOUNTER — Telehealth (INDEPENDENT_AMBULATORY_CARE_PROVIDER_SITE_OTHER): Payer: Medicare HMO | Admitting: Family Medicine

## 2022-09-28 VITALS — Ht 69.0 in

## 2022-09-28 DIAGNOSIS — J069 Acute upper respiratory infection, unspecified: Secondary | ICD-10-CM

## 2022-09-28 MED ORDER — ALBUTEROL SULFATE HFA 108 (90 BASE) MCG/ACT IN AERS: 2.0000 | INHALATION_SPRAY | Freq: Four times a day (QID) | RESPIRATORY_TRACT | 2 refills | Status: AC | PRN

## 2022-09-28 NOTE — Telephone Encounter (Signed)
[facility] Night - Client Nonclinical Telephone Record  [facility] Client [facility] Night - Client Client Site [place] - Night Provider Eliezer Lofts - MD Contact Type Call Who Is Calling Patient / Member / Family / Caregiver Caller Name [place] Phone Number [phone] Patient Name Jack Norris Patient DOB [birth year] Call Type Message Only Information Provided Reason for Call Request to Schedule Office Appointment Initial Comment Caller states he has flu/covid symptoms requesting to be tested today. - no triage wanted hours and info provided Patient request to speak to RN No Disp. Time Disposition Final User 09/28/2022 7:56:32 AM General Information Provided Yes Domingo Dimes, [place] Call Closed By: Memory Argue Transaction Date/Time: 09/28/2022 7:51:06 AM (ET

## 2022-09-28 NOTE — Progress Notes (Signed)
Because you have been exposed to both flu and covid you need to be tested to see which you need to be treated for., I feel your condition warrants further evaluation and I recommend that you be seen in a face to face visit.

## 2022-09-28 NOTE — Progress Notes (Addendum)
Jack Norris T. Yaden Seith, MD, [place] at [facility] [place], [address]  Phone: [phone]  FAX: [phone]  ARO BARANOWSKI - 82 y.o. male  MRN [id number]  Date of Birth: 09-Jan-1941  Date: 09/28/2022  PCP: Jinny Sanders, MD  Referral: Jinny Sanders, MD  Chief Complaint  Patient presents with   Headache    Mild-No testing done-Symptoms started on Saturday Wife has the flu 2 children have Covid   Cough    With chest tightness Hx Asthma   Virtual Visit via Video Note:  I connected with  Jack Norris on 09/28/2022  3:20 PM EST by a video enabled telemedicine application and verified that I am speaking with the correct person using two identifiers.   Location patient: home computer, tablet, or smartphone Location provider: work or home office Consent: Verbal consent directly obtained from Jack Norris. Persons participating in the virtual visit: patient, provider  I discussed the limitations of evaluation and management by telemedicine and the availability of in person appointments. The patient expressed understanding and agreed to proceed.  Chief Complaint  Patient presents with   Headache    Mild-No testing done-Symptoms started on Saturday Wife has the flu 2 children have Covid   Cough    With chest tightness Hx Asthma    History of Present Illness:  Not feeling all that bad.  Was out of town a week ago in [place], got back, wife started to get sick and dx with the flu.   2 children both have covid  Little sore throat Some mild cough  He generally feels like he is not that bad.  He does have a longstanding history of asthma, and he needs an albuterol inhaler.  Review of Systems as above: See pertinent positives and pertinent negatives per HPI No acute distress verbally   Observations/Objective/Exam:  An attempt was made to discern vital signs over the phone and per patient if  applicable and possible.   General:    Alert, Oriented, appears well and in no acute distress  Pulmonary:     On inspection no signs of respiratory distress.  Psych / Neurological:     Pleasant and cooperative.  Assessment and Plan:    ICD-10-CM   1. Viral URI  J06.9      Likely viral URI.  Continue with supportive care, and I refilled his albuterol.  He is going to check a COVID-19 test over-the-counter, and he will let me know if this comes back positive.  If that is the case, then I think using antivirals would make sense given his age and asthma.  Addendum: 10/02/22 10:12 AM  The patient calls in with worsening symptoms.  I sent in a course of doxycycline.   I discussed the assessment and treatment plan with the patient. The patient was provided an opportunity to ask questions and all were answered. The patient agreed with the plan and demonstrated an understanding of the instructions.   The patient was advised to call back or seek an in-person evaluation if the symptoms worsen or if the condition fails to improve as anticipated.  Follow-up: prn unless noted otherwise below No follow-ups on file.  Meds ordered this encounter  Medications   albuterol (VENTOLIN HFA) 108 (90 Base) MCG/ACT inhaler    Sig: Inhale 2 puffs into the lungs every 6 (six) hours as needed for wheezing or shortness of breath.    Dispense:  8 g    Refill:  2   No orders of the defined types were placed in this encounter.   Signed,  [name]. Evynn Boutelle, MD

## 2022-09-28 NOTE — Telephone Encounter (Signed)
I spoke with pt; on 09/26/22 pt started with tightness in chest, dry cough, mild H/A (pt taking ibuprofen), today has some sinus drainage. Pt is not sure if fever or not. No CP,SOB or dizziness. Pt scheduled VV with Dr Lorelei Pont 09/28/22 at 3:20 with UC & ED precautions and pt voiced understanding and appreciative of appt. Pt will have vital signs ready when CMA calls. Pt wife dx with flu on 09/25/22 and last week exposed to adult children who tested + covid. Sending note to Dr Lorelei Pont and [place] pool.   [facility] Night - Client Nonclinical Telephone Record  [facility] Client [facility] Night - Client Client Site [place] - Night Provider Eliezer Lofts - MD Contact Type Call Who Is Calling Patient / Member / Family / Caregiver Caller Name [place] Phone Number [phone] Patient Name Jack Norris Patient DOB 10/26/1940 Call Type Message Only Information Provided Reason for Call Request to Schedule Office Appointment Initial Comment Caller states he has flu/covid symptoms requesting to be tested today. - no triage wanted hours and info provided Patient request to speak to RN No Disp. Time Disposition Final User 09/28/2022 7:56:32 AM General Information Provided Yes Domingo Dimes, [place] Call Closed By: Memory Argue Transaction Date/Time: 09/28/2022 7:51:06 AM (ET

## 2022-10-01 ENCOUNTER — Telehealth: Payer: Self-pay | Admitting: Family Medicine

## 2022-10-01 NOTE — Telephone Encounter (Signed)
Patient was seen on Monday (09/28/2022)via mychart, his symptoms were mild then so nothing was called in for him. He called in today stating that his symptoms have gotten worse ,he's experiencing  runny nose,headache,sinus drainage and coughing. He would like to if something can be called in for him?

## 2022-10-02 MED ORDER — DOXYCYCLINE HYCLATE 100 MG PO TABS: 100.0000 mg | ORAL_TABLET | Freq: Two times a day (BID) | ORAL | 0 refills | Status: DC

## 2022-10-02 NOTE — Telephone Encounter (Signed)
Jack Norris notified as instructed by telephone.  Patient states understanding.

## 2022-10-02 NOTE — Addendum Note (Signed)
Addended by: Owens Loffler on: 10/02/2022 10:12 AM   Modules accepted: Orders

## 2022-10-02 NOTE — Telephone Encounter (Signed)
Pt did have VV on 09/28/22 per chart review tab.

## 2022-10-02 NOTE — Telephone Encounter (Signed)
Can you let him know that I sent him in a course of doxycycline.

## 2022-10-08 ENCOUNTER — Ambulatory Visit (INDEPENDENT_AMBULATORY_CARE_PROVIDER_SITE_OTHER): Payer: Medicare HMO | Admitting: Family Medicine

## 2022-10-08 ENCOUNTER — Encounter (INDEPENDENT_AMBULATORY_CARE_PROVIDER_SITE_OTHER): Payer: Medicare HMO | Admitting: Ophthalmology

## 2022-10-08 ENCOUNTER — Encounter: Payer: Self-pay | Admitting: Family Medicine

## 2022-10-08 VITALS — BP 124/60 | HR 74 | Temp 97.9°F | Ht 69.0 in | Wt 221.0 lb

## 2022-10-08 DIAGNOSIS — H35373 Puckering of macula, bilateral: Secondary | ICD-10-CM | POA: Diagnosis not present

## 2022-10-08 DIAGNOSIS — I10 Essential (primary) hypertension: Secondary | ICD-10-CM | POA: Diagnosis not present

## 2022-10-08 DIAGNOSIS — H34232 Retinal artery branch occlusion, left eye: Secondary | ICD-10-CM | POA: Insufficient documentation

## 2022-10-08 DIAGNOSIS — H353122 Nonexudative age-related macular degeneration, left eye, intermediate dry stage: Secondary | ICD-10-CM | POA: Diagnosis not present

## 2022-10-08 DIAGNOSIS — H43811 Vitreous degeneration, right eye: Secondary | ICD-10-CM | POA: Diagnosis not present

## 2022-10-08 DIAGNOSIS — I2510 Atherosclerotic heart disease of native coronary artery without angina pectoris: Secondary | ICD-10-CM

## 2022-10-08 NOTE — Assessment & Plan Note (Signed)
Acute, asymptomatic Occurred while on baby aspirin so recommended increasing to aspirin 325 mg daily  Recommended further risk reduction with improved lifestyle change including low-cholesterol diet, regular exercise and weight management. Last LDL in December was around 73, suggested increasing rosuvastatin to 10 mg daily for further cholesterol reduction.  Will move ahead with echocardiogram and carotid Dopplers .  Patient has upcoming follow-up with cardiology within the next several weeks.

## 2022-10-08 NOTE — Assessment & Plan Note (Signed)
Upcoming follow-up with Dr. Aloha Gell

## 2022-10-08 NOTE — Assessment & Plan Note (Signed)
Stable, chronic.  Continue current medication.   lisinopril 40 mg daily , amlodipine 5 mg daily

## 2022-10-08 NOTE — Patient Instructions (Addendum)
Increase rosuvastatin to 10 mg daily... return for cholesterol receheck nin 3 months.. labs only.  Work on 3-5 days a week exercise.  Work on low cholesterol diet, heart healthy diet.  Increase to aspirin 325 mg daily.

## 2022-10-08 NOTE — Progress Notes (Signed)
Patient ID: Jack Norris, male    DOB: 07-Jun-1941, 82 y.o.   MRN: [id number]  This visit was conducted in person.  BP 124/60   Pulse 74   Temp 97.9 F (36.6 C) (Temporal)   Ht 5' 9"$  (1.753 m)   Wt 221 lb (100.2 kg)   SpO2 97%   BMI 32.64 kg/m    CC:  Chief Complaint  Patient presents with   Asymptomic Stroke in Left Eye    Per Dr. Zadie Rhine    Subjective:   HPI: Jack Norris is a 82 y.o. male presenting on 10/08/2022 for Asymptomic Stroke in Left Eye (Per Dr. Zadie Rhine)  Per MD.. asymptomatic Branch retinal artery occlusion (BRAO) in  left eye.  Also history of Aortic atherosclerosis, CAD  Last check LDL at goal < 70 on rosuvastatin 5 mg daily and fenofibrate. Lab Results  Component Value Date   CHOL 135 08/06/2022   HDL 43.60 08/06/2022   LDLCALC 73 08/06/2022   LDLDIRECT 107.6 12/14/2011   TRIG 91.0 08/06/2022   CHOLHDL 3 08/06/2022   Also told calcium build up in artery, no claudicaiton symptoms.   On baby aspirin.   Has upcoming appt with cardiology. Dr. Irish Lack  Relevant past medical, surgical, family and social history reviewed and updated as indicated. Interim medical history since our last visit reviewed. Allergies and medications reviewed and updated. Outpatient Medications Prior to Visit  Medication Sig Dispense Refill   albuterol (VENTOLIN HFA) 108 (90 Base) MCG/ACT inhaler Inhale 2 puffs into the lungs every 6 (six) hours as needed for wheezing or shortness of breath. 8 g 2   amLODipine (NORVASC) 10 MG tablet TAKE 1 TABLET BY MOUTH EVERY DAY 90 tablet 3   aspirin 81 MG tablet Take 81 mg by mouth daily.     B-D 3CC LUER-LOK SYR 18GX1-1/2 18G X 1-1/2" 3 ML MISC USE ONCE A WEEK AS DIRECTED  2   fenofibrate micronized (LOFIBRA) 67 MG capsule TAKE 1 CAPSULE(67 MG) BY MOUTH DAILY BEFORE BREAKFAST 90 capsule 3   lisinopril (ZESTRIL) 40 MG tablet TAKE 1 TABLET(40 MG) BY MOUTH DAILY 90 tablet 3   LUTEIN PO Take 1 tablet by mouth daily.      omeprazole (PRILOSEC) 20 MG capsule Take 20 mg by mouth daily. Per patient OTC     psyllium (METAMUCIL) 58.6 % powder Take 1 packet by mouth daily.     rosuvastatin (CRESTOR) 5 MG tablet TAKE 1 TABLET(5 MG) BY MOUTH DAILY 90 tablet 3   testosterone cypionate (DEPOTESTOSTERONE CYPIONATE) 200 MG/ML injection Inject 0.5 mLs (100 mg total) into the muscle every 7 (seven) days. 10 mL 0   triamcinolone (KENALOG) 0.025 % cream Apply 1 application topically as needed.     doxycycline (VIBRA-TABS) 100 MG tablet Take 1 tablet (100 mg total) by mouth 2 (two) times daily. 20 tablet 0   No facility-administered medications prior to visit.     Per HPI unless specifically indicated in ROS section below Review of Systems  Constitutional:  Negative for fatigue and fever.  HENT:  Negative for ear pain.   Eyes:  Negative for pain.  Respiratory:  Negative for cough and shortness of breath.   Cardiovascular:  Negative for chest pain, palpitations and leg swelling.  Gastrointestinal:  Negative for abdominal pain.  Genitourinary:  Negative for dysuria.  Musculoskeletal:  Negative for arthralgias.  Neurological:  Negative for syncope, light-headedness and headaches.  Psychiatric/Behavioral:  Negative for dysphoric mood.  Objective:  BP 124/60   Pulse 74   Temp 97.9 F (36.6 C) (Temporal)   Ht 5' 9"$  (1.753 m)   Wt 221 lb (100.2 kg)   SpO2 97%   BMI 32.64 kg/m   Wt Readings from Last 3 Encounters:  10/08/22 221 lb (100.2 kg)  08/13/22 219 lb 2 oz (99.4 kg)  08/10/22 215 lb (97.5 kg)      Physical Exam Constitutional:      Appearance: He is well-developed. He is obese.  HENT:     Head: Normocephalic.     Right Ear: Hearing normal.     Left Ear: Hearing normal.     Nose: Nose normal.  Neck:     Thyroid: No thyroid mass or thyromegaly.     Vascular: No carotid bruit.     Trachea: Trachea normal.  Cardiovascular:     Rate and Rhythm: Normal rate and regular rhythm.     Pulses: Normal pulses.      Heart sounds: Heart sounds not distant. No murmur heard.    No friction rub. No gallop.     Comments: No peripheral edema Pulmonary:     Effort: Pulmonary effort is normal. No respiratory distress.     Breath sounds: Normal breath sounds.  Skin:    General: Skin is warm and dry.     Findings: No rash.  Psychiatric:        Speech: Speech normal.        Behavior: Behavior normal.        Thought Content: Thought content normal.       Results for orders placed or performed in visit on 08/06/22  Comprehensive metabolic panel  Result Value Ref Range   Sodium 141 135 - 145 mEq/L   Potassium 4.3 3.5 - 5.1 mEq/L   Chloride 106 96 - 112 mEq/L   CO2 28 19 - 32 mEq/L   Glucose, Bld 92 70 - 99 mg/dL   BUN 17 6 - 23 mg/dL   Creatinine, Ser 1.17 0.40 - 1.50 mg/dL   Total Bilirubin 0.7 0.2 - 1.2 mg/dL   Alkaline Phosphatase 84 39 - 117 U/L   AST 17 0 - 37 U/L   ALT 14 0 - 53 U/L   Total Protein 7.0 6.0 - 8.3 g/dL   Albumin 4.6 3.5 - 5.2 g/dL   GFR 58.52 (L) >60.00 mL/min   Calcium 9.6 8.4 - 10.5 mg/dL  Lipid panel  Result Value Ref Range   Cholesterol 135 0 - 200 mg/dL   Triglycerides 91.0 0.0 - 149.0 mg/dL   HDL 43.60 >39.00 mg/dL   VLDL 18.2 0.0 - 40.0 mg/dL   LDL Cholesterol 73 0 - 99 mg/dL   Total CHOL/HDL Ratio 3    NonHDL 91.06   Hemoglobin A1c  Result Value Ref Range   Hgb A1c MFr Bld 5.8 4.6 - 6.5 %    Assessment and Plan  Branch retinal artery occlusion of left eye Assessment & Plan: Acute, asymptomatic Occurred while on baby aspirin so recommended increasing to aspirin 325 mg daily  Recommended further risk reduction with improved lifestyle change including low-cholesterol diet, regular exercise and weight management. Last LDL in December was around 73, suggested increasing rosuvastatin to 10 mg daily for further cholesterol reduction.  Will move ahead with echocardiogram and carotid Dopplers .  Patient has upcoming follow-up with cardiology within the next  several weeks.  Orders: -     VAS US CAROTID; Future -  ECHOCARDIOGRAM COMPLETE; Future  Benign essential hypertension Assessment & Plan: Stable, chronic.  Continue current medication.   lisinopril 40 mg daily , amlodipine 5 mg daily    Coronary artery disease involving native coronary artery of native heart without angina pectoris Assessment & Plan: Upcoming follow-up with Dr. Aloha Gell      No follow-ups on file.   Eliezer Lofts, MD

## 2022-10-09 ENCOUNTER — Telehealth: Payer: Self-pay | Admitting: *Deleted

## 2022-10-09 NOTE — Telephone Encounter (Signed)
Fax received in office from [facility].  Based on recent eye symptoms it is recommended patient have testing.  Per Dr Irish Lack carotid doppler and echo should be ordered.  Chart reviewed and patient saw PCP yesterday and testing has been ordered.  Patient has appointment with Ermalinda Barrios, PA on 10/20/22

## 2022-10-12 ENCOUNTER — Encounter: Payer: Self-pay | Admitting: Family Medicine

## 2022-10-12 ENCOUNTER — Telehealth: Payer: Medicare HMO

## 2022-10-12 NOTE — Progress Notes (Signed)
Cardiology Office Note:    Date:  10/20/2022   ID:  Jack Norris, DOB 1941-07-30, MRN [id number]  PCP:  Jack Sanders, MD  [facility] Cardiologist:  Jack Grooms, MD     Referring MD: Jack Sanders, MD   Chief Complaint:  retinal artery occlusion     History of Present Illness:   Jack Norris is a 82 y.o. male with history of hyperlipidemia, palpitations,  family history of CAD with no ischemia on stress test in 2018,  frequent monomorphic PVCs during peak stress, coronary  coronary CTA 2019 with calcium score 976 suspected severe 70-90% PDA but FFR negative,..  Last saw Dr. Irish Norris 02/2020 and doing well.  Patient last saw Dr. Irish Norris 04/14/2022 and was doing well without symptoms.  Patient added onto my schedule because of branch retinal artery occlusion. Found on routine exam-no visual symptoms.   Dr. Irish Norris recommended echo and Dopplers -no occlusion. Echo normal LVEF Grade 1DD. Denies chest pain, dyspnea, dizziness, palpitations. No regular exercise.       Past Medical History:  Diagnosis Date   Asthma, mild    as child-only now if has URI   BPH (benign prostatic hypertrophy)    BPPV (benign paroxysmal positional vertigo)    Diverticulosis    Eczema    Fatty liver disease, nonalcoholic    Frequent nosebleeds    History of   GERD (gastroesophageal reflux disease)    occ uses TUMS as needed   Glaucoma    History of bronchitis    History of colon polyps    History of diverticulitis    History of kidney stones    Hyperlipemia    Hypertension    Influenza A 11/2017   Memory changes    OA (osteoarthritis)    Obesity    Overactive bladder 2020   PVCs (premature ventricular contractions)    Renal calculi right    AND LEFT X1 NON-OBSTRUCTIVE   Right sided sciatica    Bilateral buttock and thighs   Simple renal cyst right interpoler, simpler in nature , asymptomatic  per urologist note (dr Jack Norris)  12-26-2011   BENIGN,  has been drained   Testosterone deficiency    Wears glasses    Wears hearing aid    Current Medications: Current Meds  Medication Sig   albuterol (VENTOLIN HFA) 108 (90 Base) MCG/ACT inhaler Inhale 2 puffs into the lungs every 6 (six) hours as needed for wheezing or shortness of breath.   amLODipine (NORVASC) 10 MG tablet TAKE 1 TABLET BY MOUTH EVERY DAY   aspirin EC 325 MG tablet Take 325 mg by mouth daily.   B-D 3CC LUER-LOK SYR 18GX1-1/2 18G X 1-1/2" 3 ML MISC USE ONCE A WEEK AS DIRECTED   fenofibrate micronized (LOFIBRA) 67 MG capsule TAKE 1 CAPSULE(67 MG) BY MOUTH DAILY BEFORE BREAKFAST   lisinopril (ZESTRIL) 40 MG tablet TAKE 1 TABLET(40 MG) BY MOUTH DAILY   LUTEIN PO Take 1 tablet by mouth daily.   omeprazole (PRILOSEC) 20 MG capsule Take 20 mg by mouth daily. Per patient OTC   psyllium (METAMUCIL) 58.6 % powder Take 1 packet by mouth daily.   testosterone cypionate (DEPOTESTOSTERONE CYPIONATE) 200 MG/ML injection Inject 0.5 mLs (100 mg total) into the muscle every 7 (seven) days.   triamcinolone (KENALOG) 0.025 % cream Apply 1 application topically as needed.   [DISCONTINUED] rosuvastatin (CRESTOR) 10 MG tablet Take 1 tablet (10 mg total) by mouth daily.   [DISCONTINUED]  rosuvastatin (CRESTOR) 5 MG tablet TAKE 1 TABLET(5 MG) BY MOUTH DAILY (Patient taking differently: Take 10 mg by mouth daily. TAKE 1 TABLET(5 MG) BY MOUTH DAILY)   [DISCONTINUED] rosuvastatin (CRESTOR) 5 MG tablet Take 10 mg by mouth every evening.    Allergies:   Paroxetine, Morphine and related, Ciprofloxacin, and Flagyl [metronidazole hcl]   Social History   Tobacco Use   Smoking status: Former    Years: 8.00    Types: Cigarettes    Quit date: 01/05/1967    Years since quitting: 55.8   Smokeless tobacco: Never   Tobacco comments:    QUIT AGE 13  Vaping Use   Vaping Use: Never used  Substance Use Topics   Alcohol use: Yes    Alcohol/week: 0.0 standard drinks of alcohol    Comment: OCCASIONAL   Drug  use: No    Family Hx: The patient's family history includes Cancer in his father and mother; Heart disease in his father; Squamous cell carcinoma in his mother.  ROS     Physical Exam:    VS:  BP (!) 110/58   Pulse 87   Ht '5\' 9"'$  (1.753 m)   Wt 223 lb 3.2 oz (101.2 kg)   SpO2 95%   BMI 32.96 kg/m     Wt Readings from Last 3 Encounters:  10/20/22 223 lb 3.2 oz (101.2 kg)  10/08/22 221 lb (100.2 kg)  08/13/22 219 lb 2 oz (99.4 kg)    Physical Exam  GEN: Obese, in no acute distress  Neck: no JVD, carotid bruits, or masses Cardiac:RRR; no murmurs, rubs, or gallops  Respiratory:  clear to auscultation bilaterally, normal work of breathing GI: soft, nontender, nondistended, + BS Ext: without cyanosis, clubbing, or edema, Good distal pulses bilaterally Neuro:  Alert and Oriented x 3,  Psych: euthymic mood, full affect        EKGs/Labs/Other Test Reviewed:    EKG:  EKG is not ordered today.     Recent Labs: 08/06/2022: ALT 14; BUN 17; Creatinine, Ser 1.17; Potassium 4.3; Sodium 141   Recent Lipid Panel Recent Labs    08/06/22 0733  CHOL 135  TRIG 91.0  HDL 43.60  VLDL 18.2  LDLCALC 73     Prior CV Studies:    Echo 10/19/22 IMPRESSIONS     1. Left ventricular ejection fraction, by estimation, is 50 to 55%. The  left ventricle has low normal function. The left ventricle has no regional  wall motion abnormalities. Left ventricular diastolic parameters are  consistent with Grade I diastolic  dysfunction (impaired relaxation).   2. Right ventricular systolic function is normal. The right ventricular  size is normal. Tricuspid regurgitation signal is inadequate for assessing  PA pressure.   3. The mitral valve is normal in structure. Trivial mitral valve  regurgitation. No evidence of mitral stenosis.   4. The aortic valve is tricuspid. Aortic valve regurgitation is trivial.  No aortic stenosis is present.   5. The inferior vena cava is normal in size with  greater than 50%  respiratory variability, suggesting right atrial pressure of 3 mmHg.   Comparison(s): No prior Echocardiogram.   Dopplers 10/19/22 Summary:  Right Carotid: There is no evidence of stenosis in the right ICA.   Left Carotid: There is no evidence of stenosis in the left ICA.   Vertebrals: Bilateral vertebral arteries demonstrate antegrade flow.   *See table(s) above for measurements and observations.      Electronically signed by Antony Contras  MD on 10/19/2022 at 1:00:02 PM.        Final    Risk Assessment/Calculations/Metrics:              ASSESSMENT & PLAN:   No problem-specific Assessment & Plan notes found for this encounter.   Branch retinal artery occlusion carotid Dopplers-no occlusion and echo normal LVEF grade 1DD,  2 week ZIO if no Afib consider loop recorder  CAD moderate by CTA 2019 aggressive secondary prevention-no angina. ASA and Crestor increased by PCP  Hypertension well controlled  Hyperlipidemia LDL 73, crestor increased 10 mg by PCP  DM2 managed by PCP  Obesity-exercise and weight loss  PVCs            Dispo:  Return in about 1 month (around 11/18/2022) for Routine follow up in 1 months with Ermalinda Barrios, PA.   Medication Adjustments/Labs and Tests Ordered: Current medicines are reviewed at length with the patient today.  Concerns regarding medicines are outlined above.  Tests Ordered: Orders Placed This Encounter  Procedures   LONG TERM MONITOR-LIVE TELEMETRY (3-14 DAYS)   Medication Changes: Meds ordered this encounter  Medications   DISCONTD: rosuvastatin (CRESTOR) 10 MG tablet    Sig: Take 1 tablet (10 mg total) by mouth daily.    Dispense:  90 tablet    Refill:  3   Signed, Ermalinda Barrios, PA-C  10/20/2022 11:51 AM    [facility] [address], [place], [place]  [address] Phone: [phone]; Fax: [phone]

## 2022-10-14 ENCOUNTER — Telehealth: Payer: Self-pay

## 2022-10-14 NOTE — Progress Notes (Signed)
Care Management & Coordination Services Pharmacy Team  Reason for Encounter: Appointment Reminder  Contacted patient to confirm telephone appointment with Charlene Brooke, PharmD on 10/19/2022 at 2:30.  Unsuccessful outreach. Left voicemail for patient to return call.  Star Rating Drugs:  Medication:  Last Fill: Day Supply Lisinopril 40 mg 08/10/2022 90  Rosuvastatin 5 mg 07/27/2022 90  Care Gaps: Annual wellness visit in last year? Yes 08/10/2022  Charlene Brooke, PharmD notified  Marijean Niemann, [facility] Pharmacy Assistant [phone]

## 2022-10-19 ENCOUNTER — Ambulatory Visit (HOSPITAL_COMMUNITY)
Admission: RE | Admit: 2022-10-19 | Discharge: 2022-10-19 | Disposition: A | Discharge status: Home / Self Care | Payer: Medicare HMO | Source: Ambulatory Visit | Attending: Family Medicine | Admitting: Family Medicine

## 2022-10-19 ENCOUNTER — Ambulatory Visit: Payer: Medicare HMO | Admitting: Pharmacist

## 2022-10-19 DIAGNOSIS — I493 Ventricular premature depolarization: Secondary | ICD-10-CM | POA: Insufficient documentation

## 2022-10-19 DIAGNOSIS — I2510 Atherosclerotic heart disease of native coronary artery without angina pectoris: Secondary | ICD-10-CM | POA: Diagnosis not present

## 2022-10-19 DIAGNOSIS — H34239 Retinal artery branch occlusion, unspecified eye: Secondary | ICD-10-CM | POA: Diagnosis not present

## 2022-10-19 DIAGNOSIS — I639 Cerebral infarction, unspecified: Secondary | ICD-10-CM | POA: Diagnosis not present

## 2022-10-19 DIAGNOSIS — Z8249 Family history of ischemic heart disease and other diseases of the circulatory system: Secondary | ICD-10-CM | POA: Diagnosis not present

## 2022-10-19 DIAGNOSIS — E785 Hyperlipidemia, unspecified: Secondary | ICD-10-CM | POA: Insufficient documentation

## 2022-10-19 DIAGNOSIS — E669 Obesity, unspecified: Secondary | ICD-10-CM | POA: Insufficient documentation

## 2022-10-19 DIAGNOSIS — I10 Essential (primary) hypertension: Secondary | ICD-10-CM | POA: Diagnosis not present

## 2022-10-19 DIAGNOSIS — H34232 Retinal artery branch occlusion, left eye: Secondary | ICD-10-CM

## 2022-10-19 LAB — ECHOCARDIOGRAM COMPLETE
Area-P 1/2: 2.83 cm2
MV M vel: 4.02 m/s
MV Peak grad: 64.6 mmHg
P 1/2 time: 865 msec
S' Lateral: 4.5 cm
Single Plane A4C EF: 53.6 %

## 2022-10-19 NOTE — Progress Notes (Signed)
Carotid artery duplex has been completed. Preliminary results can be found in CV Proc through chart review.   10/19/22 10:30 AM Carlos Levering RVT

## 2022-10-19 NOTE — Progress Notes (Signed)
  Echocardiogram 2D Echocardiogram has been performed.  Eartha Inch 10/19/2022, 9:59 AM

## 2022-10-19 NOTE — Progress Notes (Unsigned)
Care Management & Coordination Services Pharmacy Note  10/19/2022 Name:  Jack Norris MRN:  [id number] DOB:  1941/07/05  Summary: -Reviewed recent retinal artery occlusion treatment: pt has increased rosuvastatin to 10 mg and aspirin to 325 mg per PCP recommendations; he has appt with cardiology tomorrow to discuss further risk factor modification  Recommendations/Changes made from today's visit: -Discussed bleeding risk with higher dose of aspirin; advised to avoid NSAIDs and continue PPI  Follow up plan: -Health Concierge will call patient 6 months for general update -Pharmacist follow up PRN -cardiology appt 10/20/22; PCP appt 08/20/23    Subjective: Jack Norris is an 82 y.o. year old male who is a primary patient of Bedsole, Amy E, MD.  The care coordination team was consulted for assistance with disease management and care coordination needs.    Engaged with patient by telephone for follow up visit.  Recent office visits: 10/08/22 Dr Diona Browner OV: retinal artery occlusion - while on ASA 81, rec'd increase to 325 mg. Also increase rosuvastatin to 10 mg. RTC 3 month for lipid panel.  08/13/22 Dr Diona Browner OV: annual - no changes. Start exercise.  07/21/22 Dr Diona Browner OV: COVID - Rx Lagevrio  Recent consult visits: 04/14/22 Dr Irish Lack (Cardiology): CAD stable, encouraged exercise. No changes.  Hospital visits: None in previous 6 months   Objective:  Lab Results  Component Value Date   CREATININE 1.17 08/06/2022   BUN 17 08/06/2022   GFR 58.52 (L) 08/06/2022   GFRNONAA 54 (L) 06/02/2021   GFRAA 58 (L) 05/03/2019   NA 141 08/06/2022   K 4.3 08/06/2022   CALCIUM 9.6 08/06/2022   CO2 28 08/06/2022   GLUCOSE 92 08/06/2022    Lab Results  Component Value Date/Time   HGBA1C 5.8 08/06/2022 07:33 AM   GFR 58.52 (L) 08/06/2022 07:33 AM   GFR 53.41 (L) 08/01/2021 08:01 AM    Last diabetic Eye exam: No results found for: "HMDIABEYEEXA"  Last diabetic Foot exam:  No results found for: "HMDIABFOOTEX"   Lab Results  Component Value Date   CHOL 135 08/06/2022   HDL 43.60 08/06/2022   LDLCALC 73 08/06/2022   LDLDIRECT 107.6 12/14/2011   TRIG 91.0 08/06/2022   CHOLHDL 3 08/06/2022       Latest Ref Rng & Units 08/06/2022    7:33 AM 08/01/2021    8:01 AM 06/02/2021    3:25 PM  Hepatic Function  Total Protein 6.0 - 8.3 g/dL 7.0  6.8  7.2   Albumin 3.5 - 5.2 g/dL 4.6  4.3  4.4   AST 0 - 37 U/L '17  18  17   '$ ALT 0 - 53 U/L '14  15  15   '$ Alk Phosphatase 39 - 117 U/L 84  62  71   Total Bilirubin 0.2 - 1.2 mg/dL 0.7  0.8  0.7     Lab Results  Component Value Date/Time   TSH 0.86 01/04/2020 11:31 AM   TSH 0.792 07/04/2019 01:30 PM   FREET4 0.92 01/04/2020 11:31 AM   FREET4 0.84 04/16/2016 02:58 PM       Latest Ref Rng & Units 06/02/2021    3:25 PM 04/08/2018   10:23 AM 04/16/2016    2:58 PM  CBC  WBC 4.0 - 10.5 K/uL 8.2   7.8   Hemoglobin 13.0 - 17.0 g/dL 15.6  15.0  13.7   Hematocrit 39.0 - 52.0 % 46.5  44.0  40.1   Platelets 150 - 400 K/uL 280  258.0     Lab Results  Component Value Date/Time   VD25OH 39.42 04/16/2016 02:58 PM   VD25OH 63 01/27/2013 09:02 AM   [id number] 07/04/2019 01:30 PM   VITAMINB12 258 04/16/2016 02:58 PM    Clinical ASCVD: Yes  The ASCVD Risk score (Arnett DK, et al., 2019) failed to calculate for the following reasons:   The 2019 ASCVD risk score is only valid for ages 5 to 65        08/10/2022    3:16 PM 07/11/2021   12:29 PM 07/10/2020    2:53 PM  Depression screen PHQ 2/9  Decreased Interest 0 0 0  Down, Depressed, Hopeless 0 0 0  PHQ - 2 Score 0 0 0  Altered sleeping   0  Tired, decreased energy   0  Change in appetite   0  Feeling bad or failure about yourself    0  Trouble concentrating   0  Moving slowly or fidgety/restless   0  Suicidal thoughts   0  PHQ-9 Score   0  Difficult doing work/chores   Not difficult at all     Social History   Tobacco Use  Smoking Status Former    Years: 8.00   Types: Cigarettes   Quit date: 01/05/1967   Years since quitting: 55.8  Smokeless Tobacco Never  Tobacco Comments   QUIT AGE 33   BP Readings from Last 3 Encounters:  10/08/22 124/60  08/13/22 122/66  07/21/22 118/66   Pulse Readings from Last 3 Encounters:  10/08/22 74  08/13/22 78  07/21/22 80   Wt Readings from Last 3 Encounters:  10/08/22 221 lb (100.2 kg)  08/13/22 219 lb 2 oz (99.4 kg)  08/10/22 215 lb (97.5 kg)   BMI Readings from Last 3 Encounters:  10/08/22 32.64 kg/m  09/28/22 32.36 kg/m  08/13/22 32.36 kg/m    Allergies  Allergen Reactions   Paroxetine     REACTION: Had mild personality change on this medication and doesn't want to take this again.   Morphine And Related     Causes bladder problems   Ciprofloxacin Other (See Comments)    ABDOMINAL PAIN/ sob   Flagyl [Metronidazole Hcl] Other (See Comments)    ABDOMINAL PAIN/ sob    Medications Reviewed Today     Reviewed by Charlton Haws, [facility] (Pharmacist) on 10/19/22 at 1500  Med List Status: <None>   Medication Order Taking? Sig Documenting Provider Last Dose Status Informant  albuterol (VENTOLIN HFA) 108 (90 Base) MCG/ACT inhaler [id number] Yes Inhale 2 puffs into the lungs every 6 (six) hours as needed for wheezing or shortness of breath. Owens Loffler, MD Taking Active   amLODipine (NORVASC) 10 MG tablet [id number] Yes TAKE 1 TABLET BY MOUTH EVERY DAY Jettie Booze, MD Taking Active   aspirin EC 325 MG tablet [id number] Yes Take 325 mg by mouth daily. [provider] Taking Active Self  B-D 3CC LUER-LOK SYR 18GX1-1/2 18G X 1-1/2" 3 ML MISC [id number] Yes USE ONCE A WEEK AS DIRECTED [provider] Taking Active Self  fenofibrate micronized (LOFIBRA) 67 MG capsule [id number] Yes TAKE 1 CAPSULE(67 MG) BY MOUTH DAILY BEFORE BREAKFAST Jettie Booze, MD Taking Active   lisinopril (ZESTRIL) 40 MG tablet [id number] Yes TAKE 1 TABLET(40 MG) BY MOUTH DAILY  Jettie Booze, MD Taking Active   LUTEIN PO [id number] Yes Take 1 tablet by mouth daily. [provider] Taking Active   omeprazole (PRILOSEC) 20 MG  capsule [id number] Yes Take 20 mg by mouth daily. Per patient OTC [provider] Taking Active   psyllium (METAMUCIL) 58.6 % powder [id number] Yes Take 1 packet by mouth daily. [provider] Taking Active Self  rosuvastatin (CRESTOR) 5 MG tablet [id number] Yes TAKE 1 TABLET(5 MG) BY MOUTH DAILY  Patient taking differently: Take 10 mg by mouth daily. TAKE 1 TABLET(5 MG) BY MOUTH DAILY   Jettie Booze, MD Taking Active   testosterone cypionate (DEPOTESTOSTERONE CYPIONATE) 200 MG/ML injection [id number] Yes Inject 0.5 mLs (100 mg total) into the muscle every 7 (seven) days. Jinny Sanders, MD Taking Active   triamcinolone (KENALOG) 0.025 % cream [id number] Yes Apply 1 application topically as needed. [provider] Taking Active             SDOH:  (Social Determinants of Health) assessments and interventions performed: No SDOH Interventions    Flowsheet Row Clinical Support from 08/10/2022 in [facility] at [place] from 07/10/2020 in [facility] at [place] from 06/15/2019 in [facility] at [place] Interventions Intervention Not Indicated -- --  Transportation Interventions Intervention Not Indicated -- --  Depression Interventions/Treatment  -- [id number] Score <4 Follow-up Not Indicated PHQ2-9 Score <4 Follow-up Not Indicated  Financial Strain Interventions Intervention Not Indicated -- --  Physical Activity Interventions Intervention Not Indicated -- --  Stress Interventions Intervention Not Indicated -- --       Medication Assistance: None required.  Patient affirms current coverage meets needs.  Medication Access: Within the past 30 days, how often  has patient missed a dose of medication? 0 Is a pillbox or other method used to improve adherence? Yes  Factors that may affect medication adherence? no barriers identified Are meds synced by current pharmacy? No  Are meds delivered by current pharmacy? No  Does patient experience delays in picking up medications due to transportation concerns? No   Upstream Services Reviewed: Is patient disadvantaged to use [facility]?: No  Current Rx insurance plan: [employer] Name and location of Current pharmacy:  [facility] [place], [address] [place] [address] Phone: [phone] Fax: [phone]  [facility] Pharm[facility] services reviewed with patient today?: No  Patient requests to transfer care to [facility] Pharmacy?: No  Reason patient declined to change pharmacies: Not mentioned at this visit  Compliance/Adherence/Medication fill history: Care Gaps: None  Star-Rating Drugs: Lisinopril - PDC 97% Rosuvastatin - PDC 100%   ASSESSMENT / PLAN  Hypertension (BP goal <130/80) -Controlled - BP at goal in office and at home; pt endorses compliance with medications; he has OSA and wears CPAP; reports occasional lightheadedness upon standing but resolves quickly -Current home BP readings: 127/70, 132/80, 112/72 -Current treatment: Amlodipine 10 mg daily PM - Appropriate, Effective, Safe, Accessible Lisinopril 40 mg daily AM -Appropriate, Effective, Safe, Accessible -Medications previously tried: olmesartan, metoprolol -Educated on BP goals and benefits of medications for prevention of heart attack, stroke and kidney damage; Importance of home blood pressure monitoring; -Counseled to monitor BP at home weekly -Recommended to continue current medication  Hyperlipidemia / CAD: (LDL goal < 70) -Query Controlled - LDL 73 (07/2022); pt endorses compliance with medications, he recently increased rosuvastatin  to 10 mg (2 tab) and aspirin to 325 mg due to recent retinal artery occlussion,  -  Follows with Dr Irish Lack; Coronary CT showed aortic atherosclerosis, 75th percentile; per patient Trig were > 1000 20+ years ago -Current treatment: Rosuvastatin 5 mg - 2 tab daily -Appropriate, Query Effective Fenofibrate (Lofibra) 67 mg daily AM -Appropriate, Effective, Safe, Accessible Aspirin EC 325 mg daily AM -Appropriate, Effective, Safe, Accessible -Medications previously tried: gemfibrozil (2008-2020), niacin -Current exercise habits: walking, had personal trainer last year -Educated on Cholesterol goals; Benefits of statin for ASCVD risk reduction; -Reviewed bleeding risk with higher dose of aspirin; discussed avoiding NSAIDs and other supplements that can increase risk for bleeding -Recommended to continue current medication  Asthma (Goal: control symptoms and prevent exacerbations) -Controlled - uses albuterol infrequently, usually just with respiratory infections -Current treatment  Albuterol HFA prn -Appropriate, Effective, Safe, Accessible -Frequency of rescue inhaler use: rare use -Counseled on Proper inhaler technique; -Recommended to continue current medication  GERD (Goal: manage symptoms) -Controlled - pt reports no breakthrough symptoms -Current treatment  Omeprazole 20 mg daily (OTC) AM -Appropriate, Effective, Safe, Accessible -Medications previously tried: n/a  -Reviewed PPI protective effective against GI bleeding; discussed continued use in s/o full dose aspirin -Recommended to continue current medication  Health Maintenance -Vaccine gaps: Shingrix, covid booster -Testosterone prescribed by urologist, pt has been on this a few years -Triamcinolone for eczema per patient   Charlene Brooke, PharmD, BCACP Clinical Pharmacist [facility] at [facility] [phone]

## 2022-10-20 ENCOUNTER — Encounter: Payer: Self-pay | Admitting: Physician Assistant

## 2022-10-20 ENCOUNTER — Other Ambulatory Visit: Payer: Self-pay | Admitting: Physician Assistant

## 2022-10-20 ENCOUNTER — Other Ambulatory Visit (INDEPENDENT_AMBULATORY_CARE_PROVIDER_SITE_OTHER): Payer: Medicare HMO

## 2022-10-20 ENCOUNTER — Encounter: Payer: Self-pay | Admitting: Family Medicine

## 2022-10-20 ENCOUNTER — Ambulatory Visit: Payer: Medicare HMO | Attending: Physician Assistant | Admitting: Physician Assistant

## 2022-10-20 VITALS — BP 110/58 | HR 87 | Ht 69.0 in | Wt 223.2 lb

## 2022-10-20 DIAGNOSIS — I2510 Atherosclerotic heart disease of native coronary artery without angina pectoris: Secondary | ICD-10-CM

## 2022-10-20 DIAGNOSIS — E8881 Metabolic syndrome: Secondary | ICD-10-CM

## 2022-10-20 DIAGNOSIS — I10 Essential (primary) hypertension: Secondary | ICD-10-CM

## 2022-10-20 DIAGNOSIS — E782 Mixed hyperlipidemia: Secondary | ICD-10-CM

## 2022-10-20 DIAGNOSIS — I4891 Unspecified atrial fibrillation: Secondary | ICD-10-CM | POA: Diagnosis not present

## 2022-10-20 DIAGNOSIS — I493 Ventricular premature depolarization: Secondary | ICD-10-CM

## 2022-10-20 DIAGNOSIS — H34232 Retinal artery branch occlusion, left eye: Secondary | ICD-10-CM

## 2022-10-20 MED ORDER — ROSUVASTATIN CALCIUM 10 MG PO TABS: 10.0000 mg | ORAL_TABLET | Freq: Every day | ORAL | 3 refills | Status: DC

## 2022-10-20 NOTE — Patient Instructions (Signed)
Visit Information  Phone number for Pharmacist: [phone]  Thank you for meeting with me to discuss your medications! Below is a summary of what we talked about during the visit:   Summary: -Reviewed recent retinal artery occlusion treatment: pt has increased rosuvastatin to 10 mg and aspirin to 325 mg per PCP recommendations; he has appt with cardiology tomorrow to discuss further risk factor modification  Recommendations/Changes made from today's visit: -Discussed bleeding risk with higher dose of aspirin; advised to avoid NSAIDs and continue PPI  Follow up plan: -Health Concierge will call patient 6 months for general update -Pharmacist follow up PRN -cardiology appt 10/20/22; PCP appt 08/20/23   Charlene Brooke, PharmD, BCACP Clinical Pharmacist [facility] at [facility] [phone]

## 2022-10-20 NOTE — Patient Instructions (Signed)
Medication Instructions:    INCREASE Rosuvastatin one (1) tablet by mouth ( 10 mg) every evening.   *If you need a refill on your cardiac medications before your next appointment, please call your pharmacy*   Lab Work:   None ordered.   If you have labs (blood work) drawn today and your tests are completely normal, you will receive your results only by: [place] (if you have MyChart) OR A paper copy in the mail If you have any lab test that is abnormal or we need to change your treatment, we will call you to review the results.   Testing/Procedures: .  ZIO AT Long term monitor-Live Telemetry  Your physician has requested you wear a ZIO patch monitor for 14 days.  This is a single patch monitor. [employer] supplies one patch monitor per enrollment. Additional  stickers are not available.  Please do not apply patch if you will be having a Nuclear Stress Test, Echocardiogram, Cardiac CT, MRI,  or Chest Xray during the period you would be wearing the monitor. The patch cannot be worn during  these tests. You cannot remove and re-apply the ZIO AT patch monitor.  Your ZIO patch monitor will be mailed 3 day USPS to your address on file. It may take 3-5 days to  receive your monitor after you have been enrolled.  Once you have received your monitor, please review the enclosed instructions. Your monitor has  already been registered assigning a specific monitor serial # to you.   Billing and Patient Assistance Program information  Jack Norris has been supplied with any insurance information on record for billing. [employer] offers a sliding scale Patient Assistance Program for patients without insurance, or whose  insurance does not completely cover the cost of the ZIO patch monitor. You must apply for the  Patient Assistance Program to qualify for the discounted rate. To apply, call [employer] at [phone],  select option 4, select option 2 , ask to apply for the Patient Assistance  Program, (you can request an  interpreter if needed). [employer] will ask your household income and how many people are in your  household. [employer] will quote your out-of-pocket cost based on this information. They will also be able  to set up a 12 month interest free payment plan if needed.  Applying the monitor   Shave hair from upper left chest.  Hold the abrader disc by orange tab. Rub the abrader in 40 strokes over left upper chest as indicated in  your monitor instructions.  Clean area with 4 enclosed alcohol pads. Use all pads to ensure the area is cleaned thoroughly. Let  dry.  Apply patch as indicated in monitor instructions. Patch will be placed under collarbone on left side of  chest with arrow pointing upward.  Rub patch adhesive wings for 2 minutes. Remove the white label marked "1". Remove the white label  marked "2". Rub patch adhesive wings for 2 additional minutes.  While looking in a mirror, press and release button in center of patch. A small green light will flash 3-4  times. This will be your only indicator that the monitor has been turned on.  Do not shower for the first 24 hours. You may shower after the first 24 hours.  Press the button if you feel a symptom. You will hear a small click. Record Date, Time and Symptom in  the Patient Log.   Starting the Gateway  In your kit there is a Hydrographic surveyor box the  size of a cellphone. This is Airline pilot. It transmits all your  recorded data to [facility]. This box must always stay within 10 feet of you. Open the box and push the *  button. There will be a light that blinks orange and then green a few times. When the light stops  blinking, the Gateway is connected to the ZIO patch. Call [employer] at [phone] to confirm your monitor is transmitting.  Returning your monitor  Remove your patch and place it inside the [place]. In the lower half of the Gateway there is a white  bag with prepaid postage on it. Place Gateway in  bag and seal. Mail package back to [place] as soon as  possible. Your physician should have your final report approximately 7 days after you have mailed back  your monitor. Call [place] at [phone] if you have questions regarding your ZIO AT  patch monitor. Call them immediately if you see an orange light blinking on your monitor.  If your monitor falls off in less than 4 days, contact our Monitor department at [phone]. If your  monitor becomes loose or falls off after 4 days call [employer] at [phone] for suggestions on  securing your monitor    Follow-Up: At [facility], you and your health needs are our priority.  As part of our continuing mission to provide you with exceptional heart care, we have created designated Provider Care Teams.  These Care Teams include your primary Cardiologist (physician) and Advanced Practice Providers (APPs -  Physician Assistants and Nurse Practitioners) who all work together to provide you with the care you need, when you need it.  We recommend signing up for the patient portal called "MyChart".  Sign up information is provided on this After Visit Summary.  MyChart is used to connect with patients for Virtual Visits (Telemedicine).  Patients are able to view lab/test results, encounter notes, upcoming appointments, etc.  Non-urgent messages can be sent to your provider as well.   To learn more about what you can do with MyChart, go to [email or web address].    Your next appointment:   1 month(s)  Provider:   Ermalinda Barrios, PA-C     Then, Larae Grooms, MD will plan to see you again in 5 month(s).

## 2022-10-20 NOTE — Progress Notes (Unsigned)
28 DAY ZIO AT - 2 CONSECUTIVE 14 DAY ZIO AT MONITORS SERIAL # [id number] FROM OFFICE INVENTORY APPLIED TO PATIENT. PATIENT GIVEN SECOND MONITOR SERIAL # [id number] FROM OFFICE INVENTORY TO APPLY IN 14 DAYS.  Dr. Irish Lack to read.

## 2022-10-21 DIAGNOSIS — I4891 Unspecified atrial fibrillation: Secondary | ICD-10-CM | POA: Diagnosis not present

## 2022-10-21 DIAGNOSIS — H34232 Retinal artery branch occlusion, left eye: Secondary | ICD-10-CM | POA: Diagnosis not present

## 2022-10-26 DIAGNOSIS — B351 Tinea unguium: Secondary | ICD-10-CM | POA: Diagnosis not present

## 2022-10-26 DIAGNOSIS — Z859 Personal history of malignant neoplasm, unspecified: Secondary | ICD-10-CM | POA: Diagnosis not present

## 2022-10-26 DIAGNOSIS — L300 Nummular dermatitis: Secondary | ICD-10-CM | POA: Diagnosis not present

## 2022-10-26 DIAGNOSIS — Z85828 Personal history of other malignant neoplasm of skin: Secondary | ICD-10-CM | POA: Diagnosis not present

## 2022-10-26 DIAGNOSIS — L570 Actinic keratosis: Secondary | ICD-10-CM | POA: Diagnosis not present

## 2022-10-26 DIAGNOSIS — E7529 Other sphingolipidosis: Secondary | ICD-10-CM | POA: Diagnosis not present

## 2022-10-26 DIAGNOSIS — Q801 X-linked ichthyosis: Secondary | ICD-10-CM | POA: Diagnosis not present

## 2022-10-26 DIAGNOSIS — L578 Other skin changes due to chronic exposure to nonionizing radiation: Secondary | ICD-10-CM | POA: Diagnosis not present

## 2022-10-26 DIAGNOSIS — Z86018 Personal history of other benign neoplasm: Secondary | ICD-10-CM | POA: Diagnosis not present

## 2022-11-02 ENCOUNTER — Encounter (INDEPENDENT_AMBULATORY_CARE_PROVIDER_SITE_OTHER): Payer: Medicare HMO | Admitting: Ophthalmology

## 2022-11-24 ENCOUNTER — Ambulatory Visit: Payer: Medicare HMO | Admitting: Physician Assistant

## 2022-11-24 NOTE — Progress Notes (Deleted)
Cardiology Office Note:    Date:  11/24/2022   ID:  Jack Norris, DOB 05/09/1941, MRN [id number]  PCP:  Jinny Sanders, MD  [facility] Cardiologist:  Larae Grooms, MD { Click to update primary MD,subspecialty MD or APP then REFRESH:1}  *** Referring MD: Jinny Sanders, MD   Chief Complaint:  No chief complaint on file. {Click here for Visit Info    :1}    History of Present Illness:   Jack Norris is a 82 y.o. male with history of hyperlipidemia, palpitations,  family history of CAD with no ischemia on stress test in 2018,  frequent monomorphic PVCs during peak stress, coronary  coronary CTA 2019 with calcium score 976 suspected severe 70-90% PDA but FFR negative.   I saw the patient 10/20/22 because of branch retinal artery occlusion. Found on routine exam-no visual symptoms.   Dr. Irish Lack recommended echo and Dopplers -no occlusion. Echo normal LVEF Grade 1DD. Denies chest pain, dyspnea, dizziness, palpitations. No regular exercise.      Past Medical History:  Diagnosis Date   Asthma, mild    as child-only now if has URI   BPH (benign prostatic hypertrophy)    BPPV (benign paroxysmal positional vertigo)    Diverticulosis    Eczema    Fatty liver disease, nonalcoholic    Frequent nosebleeds    History of   GERD (gastroesophageal reflux disease)    occ uses TUMS as needed   Glaucoma    History of bronchitis    History of colon polyps    History of diverticulitis    History of kidney stones    Hyperlipemia    Hypertension    Influenza A 11/2017   Memory changes    OA (osteoarthritis)    Obesity    Overactive bladder 2020   PVCs (premature ventricular contractions)    Renal calculi right    AND LEFT X1 NON-OBSTRUCTIVE   Right sided sciatica    Bilateral buttock and thighs   Simple renal cyst right interpoler, simpler in nature , asymptomatic  per urologist note (dr dahlstedt)  12-26-2011   BENIGN, has been drained   Testosterone  deficiency    Wears glasses    Wears hearing aid    Current Medications: No outpatient medications have been marked as taking for the 12/08/22 encounter (Appointment) with Imogene Burn, PA-C.    Allergies:   Paroxetine, Morphine and related, Ciprofloxacin, and Flagyl [metronidazole hcl]   Social History   Tobacco Use   Smoking status: Former    Years: 8    Types: Cigarettes    Quit date: 01/05/1967    Years since quitting: 55.9   Smokeless tobacco: Never   Tobacco comments:    QUIT AGE 89  Vaping Use   Vaping Use: Never used  Substance Use Topics   Alcohol use: Yes    Alcohol/week: 0.0 standard drinks of alcohol    Comment: OCCASIONAL   Drug use: No    Family Hx: The patient's family history includes Cancer in his father and mother; Heart disease in his father; Squamous cell carcinoma in his mother.  ROS     Physical Exam:    VS:  There were no vitals taken for this visit.    Wt Readings from Last 3 Encounters:  10/20/22 223 lb 3.2 oz (101.2 kg)  10/08/22 221 lb (100.2 kg)  08/13/22 219 lb 2 oz (99.4 kg)    Physical Exam  GEN:  Well nourished, well developed, in no acute distress  HEENT: normal  Neck: no JVD, carotid bruits, or masses Cardiac:RRR; no murmurs, rubs, or gallops  Respiratory:  clear to auscultation bilaterally, normal work of breathing GI: soft, nontender, nondistended, + BS Ext: without cyanosis, clubbing, or edema, Good distal pulses bilaterally MS: no deformity or atrophy  Skin: warm and dry, no rash Neuro:  Alert and Oriented x 3, Strength and sensation are intact Psych: euthymic mood, full affect        EKGs/Labs/Other Test Reviewed:    EKG:  EKG is *** ordered today.  The ekg ordered today demonstrates ***  Recent Labs: 08/06/2022: ALT 14; BUN 17; Creatinine, Ser 1.17; Potassium 4.3; Sodium 141   Recent Lipid Panel Recent Labs    08/06/22 0733  CHOL 135  TRIG 91.0  HDL 43.60  VLDL 18.2  LDLCALC 73     Prior CV  Studies: {Select studies to display:26339}    Echo 10/19/22 IMPRESSIONS     1. Left ventricular ejection fraction, by estimation, is 50 to 55%. The  left ventricle has low normal function. The left ventricle has no regional  wall motion abnormalities. Left ventricular diastolic parameters are  consistent with Grade I diastolic  dysfunction (impaired relaxation).   2. Right ventricular systolic function is normal. The right ventricular  size is normal. Tricuspid regurgitation signal is inadequate for assessing  PA pressure.   3. The mitral valve is normal in structure. Trivial mitral valve  regurgitation. No evidence of mitral stenosis.   4. The aortic valve is tricuspid. Aortic valve regurgitation is trivial.  No aortic stenosis is present.   5. The inferior vena cava is normal in size with greater than 50%  respiratory variability, suggesting right atrial pressure of 3 mmHg.   Comparison(s): No prior Echocardiogram.    Dopplers 10/19/22 Summary:  Right Carotid: There is no evidence of stenosis in the right ICA.   Left Carotid: There is no evidence of stenosis in the left ICA.   Vertebrals: Bilateral vertebral arteries demonstrate antegrade flow.   *See table(s) above for measurements and observations.      Electronically signed by Antony Contras MD on 10/19/2022 at 1:00:02 PM.        Final    Risk Assessment/Calculations/Metrics:   {Does this patient have ATRIAL FIBRILLATION?[phone]}     No BP recorded.  {Refresh Note OR Click here to enter BP  :1}***    ASSESSMENT & PLAN:   No problem-specific Assessment & Plan notes found for this encounter.   Branch retinal artery occlusion carotid Dopplers-no occlusion and echo normal LVEF grade 1DD,  2 week ZIO if no Afib consider loop recorder   CAD moderate by CTA 2019 aggressive secondary prevention-no angina. ASA and Crestor increased by PCP   Hypertension well controlled   Hyperlipidemia LDL 73, crestor increased 10  mg by PCP   DM2 managed by PCP   Obesity-exercise and weight loss   PVCs        {Are you ordering a CV Procedure (e.g. stress test, cath, DCCV, TEE, etc)?   Press F2        :[id number]   Dispo:  No follow-ups on file.   Medication Adjustments/Labs and Tests Ordered: Current medicines are reviewed at length with the patient today.  Concerns regarding medicines are outlined above.  Tests Ordered: No orders of the defined types were placed in this encounter.  Medication Changes: No orders of the defined types were placed in  this encounter.  Sumner Boast, PA-C  11/24/2022 12:49 PM    [address], [place], [place]  [address] Phone: [phone]; Fax: [phone]

## 2022-11-27 ENCOUNTER — Telehealth: Payer: Self-pay

## 2022-11-27 DIAGNOSIS — H34232 Retinal artery branch occlusion, left eye: Secondary | ICD-10-CM

## 2022-11-27 NOTE — Telephone Encounter (Signed)
-----   Message from Dyann Kief, PA-C sent at 11/25/2022  1:17 PM EDT ----- Monitor showed no Afib NSR with rare skipping. Please refer to EPS for possible loop recorder for retinal artery occlusion thanks

## 2022-12-01 ENCOUNTER — Telehealth: Payer: Self-pay | Admitting: Interventional Cardiology

## 2022-12-01 NOTE — Telephone Encounter (Signed)
  Per MyChart Scheduling message:  Understand this referral.  Further,  I still have an appointment on 4/16 to see Herma Carson. Does she want me to keep this appointment? Dorene Sorrow  Patient has appointment with EP on 4/30. Does he still need to come see Marcelino Duster?

## 2022-12-02 NOTE — Telephone Encounter (Signed)
Patient made aware that he does not need to see [facility]. Appointment has been cancelled.

## 2022-12-03 DIAGNOSIS — H34232 Retinal artery branch occlusion, left eye: Secondary | ICD-10-CM | POA: Diagnosis not present

## 2022-12-03 DIAGNOSIS — H35373 Puckering of macula, bilateral: Secondary | ICD-10-CM | POA: Diagnosis not present

## 2022-12-03 DIAGNOSIS — H43811 Vitreous degeneration, right eye: Secondary | ICD-10-CM | POA: Diagnosis not present

## 2022-12-03 DIAGNOSIS — H34832 Tributary (branch) retinal vein occlusion, left eye, with macular edema: Secondary | ICD-10-CM | POA: Diagnosis not present

## 2022-12-03 DIAGNOSIS — H353122 Nonexudative age-related macular degeneration, left eye, intermediate dry stage: Secondary | ICD-10-CM | POA: Diagnosis not present

## 2022-12-08 ENCOUNTER — Ambulatory Visit: Payer: Medicare HMO | Admitting: Physician Assistant

## 2022-12-08 DIAGNOSIS — I2510 Atherosclerotic heart disease of native coronary artery without angina pectoris: Secondary | ICD-10-CM

## 2022-12-08 DIAGNOSIS — E7800 Pure hypercholesterolemia, unspecified: Secondary | ICD-10-CM

## 2022-12-08 DIAGNOSIS — H5021 Vertical strabismus, right eye: Secondary | ICD-10-CM | POA: Diagnosis not present

## 2022-12-08 DIAGNOSIS — I10 Essential (primary) hypertension: Secondary | ICD-10-CM

## 2022-12-08 DIAGNOSIS — E782 Mixed hyperlipidemia: Secondary | ICD-10-CM

## 2022-12-08 DIAGNOSIS — H40051 Ocular hypertension, right eye: Secondary | ICD-10-CM | POA: Diagnosis not present

## 2022-12-08 DIAGNOSIS — H34832 Tributary (branch) retinal vein occlusion, left eye, with macular edema: Secondary | ICD-10-CM | POA: Diagnosis not present

## 2022-12-08 DIAGNOSIS — H532 Diplopia: Secondary | ICD-10-CM | POA: Diagnosis not present

## 2022-12-08 DIAGNOSIS — H34232 Retinal artery branch occlusion, left eye: Secondary | ICD-10-CM | POA: Diagnosis not present

## 2022-12-08 DIAGNOSIS — Z6832 Body mass index (BMI) 32.0-32.9, adult: Secondary | ICD-10-CM

## 2022-12-08 DIAGNOSIS — I493 Ventricular premature depolarization: Secondary | ICD-10-CM

## 2022-12-22 ENCOUNTER — Ambulatory Visit: Payer: Medicare HMO | Admitting: Cardiology

## 2023-01-12 DIAGNOSIS — H35371 Puckering of macula, right eye: Secondary | ICD-10-CM | POA: Diagnosis not present

## 2023-01-12 DIAGNOSIS — H34832 Tributary (branch) retinal vein occlusion, left eye, with macular edema: Secondary | ICD-10-CM | POA: Diagnosis not present

## 2023-01-13 ENCOUNTER — Ambulatory Visit: Payer: Medicare HMO | Attending: Cardiology | Admitting: Cardiology

## 2023-01-13 ENCOUNTER — Encounter: Payer: Self-pay | Admitting: Cardiology

## 2023-01-13 VITALS — BP 138/68 | HR 82 | Ht 69.0 in | Wt 225.0 lb

## 2023-01-13 DIAGNOSIS — H34232 Retinal artery branch occlusion, left eye: Secondary | ICD-10-CM

## 2023-01-13 DIAGNOSIS — I10 Essential (primary) hypertension: Secondary | ICD-10-CM | POA: Diagnosis not present

## 2023-01-13 DIAGNOSIS — I2510 Atherosclerotic heart disease of native coronary artery without angina pectoris: Secondary | ICD-10-CM | POA: Diagnosis not present

## 2023-01-13 NOTE — Patient Instructions (Signed)
Medication Instructions:  Your physician recommends that you continue on your current medications as directed. Please refer to the Current Medication list given to you today.  Labwork: None ordered.  Testing/Procedures: None ordered.  Follow-Up:  Your physician wants you to follow-up as needed with Dr. Camnitz.      Implantable Loop Recorder Placement, Care After This sheet gives you information about how to care for yourself after your procedure. Your health care provider may also give you more specific instructions. If you have problems or questions, contact your health care provider. What can I expect after the procedure? After the procedure, it is common to have: Soreness or discomfort near the incision. Some swelling or bruising near the incision.  Follow these instructions at home: Incision care  Monitor your cardiac device site for redness, swelling, and drainage. Call the device clinic at [phone] if you experience these symptoms or fever/chills.  Keep the large square bandage on your site for 24 hours and then you may remove it yourself. Keep the steri-strips underneath in place.   You may shower after 72 hours / 3 days from your procedure with the steri-strips in place. They will usually fall off on their own, or may be removed after 10 days. Pat dry.   Avoid lotions, ointments, or perfumes over your incision until it is well-healed.  Please do not submerge in water until your site is completely healed.   Your device is MRI compatible.   Remote monitoring is used to monitor your cardiac device from home. This monitoring is scheduled every month by our office. It allows us to keep an eye on the function of your device to ensure it is working properly.  If your wound site starts to bleed apply pressure.    For help with the monitor please call [employer] Monitor Support Specialist directly at [phone].    If you have any questions/concerns please call the  device clinic at [phone].  Activity  Return to your normal activities.  General instructions Follow instructions from your health care provider about how to manage your implantable loop recorder and transmit the information. Learn how to activate a recording if this is necessary for your type of device. You may go through a metal detection gate, and you may let someone hold a metal detector over your chest. Show your ID card if needed. Do not have an MRI unless you check with your health care provider first. Take over-the-counter and prescription medicines only as told by your health care provider. Keep all follow-up visits as told by your health care provider. This is important. Contact a health care provider if: You have redness, swelling, or pain around your incision. You have a fever. You have pain that is not relieved by your pain medicine. You have triggered your device because of fainting (syncope) or because of a heartbeat that feels like it is racing, slow, fluttering, or skipping (palpitations). Get help right away if you have: Chest pain. Difficulty breathing. Summary After the procedure, it is common to have soreness or discomfort near the incision. Change your dressing as told by your health care provider. Follow instructions from your health care provider about how to manage your implantable loop recorder and transmit the information. Keep all follow-up visits as told by your health care provider. This is important. This information is not intended to replace advice given to you by your health care provider. Make sure you discuss any questions you have with your health care provider. Document   Released: 07/22/2015 Document Revised: 09/25/2017 Document Reviewed: 09/25/2017 Elsevier Patient Education  2020 Elsevier Inc.   

## 2023-01-13 NOTE — Progress Notes (Signed)
Electrophysiology Office Note   Date:  01/13/2023   ID:  Kreg, [place] [birth year], MRN [id number]  PCP:  Excell Seltzer, MD  Cardiologist:  Eldridge Dace Primary Electrophysiologist:  Adelia Baptista Jorja Loa, MD    Chief Complaint: CVA   History of Present Illness: Jack Norris is a 82 y.o. male who is being seen today for the evaluation of CVA at the request of Dyann Kief, [place]. Presenting today for electrophysiology evaluation.  He has a history of hyperlipidemia, elevated coronary calcium score.  He was found to have a retinal artery occlusion.  Currently has no visual symptoms.  He had an echo and carotid Dopplers which showed no disease and a low normal ejection fraction.  He was minimally symptomatic from his retinal artery occlusion.  This was found by his ophthalmologist.  Today, he denies symptoms of palpitations, chest pain, shortness of breath, orthopnea, PND, lower extremity edema, claudication, dizziness, presyncope, syncope, bleeding, or neurologic sequela. The patient is tolerating medications without difficulties.    Past Medical History:  Diagnosis Date   Asthma, mild    as child-only now if has URI   BPH (benign prostatic hypertrophy)    BPPV (benign paroxysmal positional vertigo)    Diverticulosis    Eczema    Fatty liver disease, nonalcoholic    Frequent nosebleeds    History of   GERD (gastroesophageal reflux disease)    occ uses TUMS as needed   Glaucoma    History of bronchitis    History of colon polyps    History of diverticulitis    History of kidney stones    Hyperlipemia    Hypertension    Influenza A 11/2017   Memory changes    OA (osteoarthritis)    Obesity    Overactive bladder 2020   PVCs (premature ventricular contractions)    Renal calculi right    AND LEFT X1 NON-OBSTRUCTIVE   Right sided sciatica    Bilateral buttock and thighs   Simple renal cyst right interpoler, simpler in nature , asymptomatic  per urologist  note (dr dahlstedt)  12-26-2011   BENIGN, has been drained   Testosterone deficiency    Wears glasses    Wears hearing aid    Past Surgical History:  Procedure Laterality Date   APPENDECTOMY     bone spur removal     BILATERAL GREAT TOE   COLONOSCOPY     CYSTO/ RIGHT RETROGRADE PYELOGRAM/ RIGHT URETER DILATATION/ RIGHT URETEROSCOPY/ LASER LITHOTRIPSY RIGHT RENAL PELVIS AND LOWER POLE CALCULI/ PLACEMENT STENT  01-11-2012  DR DAHLSTEDT   RIGHT RENAL PELVIS AND LOWER POLE CALCULI   CYSTOSCOPY W/ RETROGRADES  06/02/2012   Procedure: CYSTOSCOPY WITH RETROGRADE PYELOGRAM;  Surgeon: Marcine Matar, MD;  Location: [facility];  Service: Urology;  Laterality: Right;   CYSTOSCOPY WITH URETEROSCOPY  06/02/2012   Procedure: CYSTOSCOPY WITH URETEROSCOPY;  Surgeon: Marcine Matar, MD;  Location: [facility];  Service: Urology;  Laterality: Right;  90 MIN ALSO EXTRACTION OF CALCULI    EYE SURGERY Bilateral 2020   LITHOTRIPSY  APPROX.  1998   ESWL  left kidney x 2   ROTATOR CUFF REPAIR  2012   LEFT SHOULDER   SHOULDER ARTHROSCOPY WITH ROTATOR CUFF REPAIR AND SUBACROMIAL DECOMPRESSION  09/12/2012   Procedure: SHOULDER ARTHROSCOPY WITH ROTATOR CUFF REPAIR AND SUBACROMIAL DECOMPRESSION;  Surgeon: Mable Paris, MD;  Location: [facility];  Service: Orthopedics;  Laterality: Right;  see above  and biceps tendonotomy   SKIN LESION EXCISION Right 07/07/2021   right side of face near right ear   TRANSURETHRAL RESECTION OF BLADDER NECK N/A 04/08/2018   Procedure: TRANSURETHRAL RESECTION OF BLADDER NECK CONTRACTURE;  Surgeon: Marcine Matar, MD;  Location: [facility];  Service: Urology;  Laterality: N/A;   TRANSURETHRAL RESECTION OF PROSTATE  11/29/2009   BPH   VASECTOMY  1970'S   GEN. ANES.     Current Outpatient Medications  Medication Sig Dispense Refill   albuterol (VENTOLIN HFA) 108 (90 Base) MCG/ACT inhaler Inhale 2  puffs into the lungs every 6 (six) hours as needed for wheezing or shortness of breath. 8 g 2   amLODipine (NORVASC) 10 MG tablet TAKE 1 TABLET BY MOUTH EVERY DAY 90 tablet 3   aspirin EC 325 MG tablet Take 325 mg by mouth daily.     B-D 3CC LUER-LOK SYR 18GX1-1/2 18G X 1-1/2" 3 ML MISC USE ONCE A WEEK AS DIRECTED  2   fenofibrate micronized (LOFIBRA) 67 MG capsule TAKE 1 CAPSULE(67 MG) BY MOUTH DAILY BEFORE BREAKFAST 90 capsule 3   lisinopril (ZESTRIL) 40 MG tablet TAKE 1 TABLET(40 MG) BY MOUTH DAILY 90 tablet 3   LUTEIN PO Take 1 tablet by mouth daily.     mometasone (ELOCON) 0.1 % cream Apply 1 Application topically daily.     omeprazole (PRILOSEC) 20 MG capsule Take 20 mg by mouth daily. Per patient OTC     psyllium (METAMUCIL) 58.6 % powder Take 1 packet by mouth daily.     rosuvastatin (CRESTOR) 10 MG tablet Take 10 mg by mouth daily.     testosterone cypionate (DEPOTESTOSTERONE CYPIONATE) 200 MG/ML injection Inject 0.5 mLs (100 mg total) into the muscle every 7 (seven) days. 10 mL 0   triamcinolone (KENALOG) 0.025 % cream Apply 1 application topically as needed.     No current facility-administered medications for this visit.    Allergies:   Paroxetine, Morphine and codeine, Ciprofloxacin, and Flagyl [metronidazole hcl]   Social History:  The patient  reports that he quit smoking about 56 years ago. His smoking use included cigarettes. He has never used smokeless tobacco. He reports current alcohol use. He reports that he does not use drugs.   Family History:  The patient's family history includes Cancer in his father and mother; Heart disease in his father; Squamous cell carcinoma in his mother.    ROS:  Please see the history of present illness.   Otherwise, review of systems is positive for none.   All other systems are reviewed and negative.    PHYSICAL EXAM: VS:  BP 138/68   Pulse 82   Ht 5\' 9"  (1.753 m)   Wt 225 lb (102.1 kg)   SpO2 97%   BMI 33.23 kg/m  , BMI Body mass  index is 33.23 kg/m. GEN: Well nourished, well developed, in no acute distress  HEENT: normal  Neck: no JVD, carotid bruits, or masses Cardiac: RRR; no murmurs, rubs, or gallops,no edema  Respiratory:  clear to auscultation bilaterally, normal work of breathing GI: soft, nontender, nondistended, + BS MS: no deformity or atrophy  Skin: warm and dry Neuro:  Strength and sensation are intact Psych: euthymic mood, full affect  EKG:  EKG is ordered today. Personal review of the ekg ordered shows sinus rhythm  Recent Labs: 08/06/2022: ALT 14; BUN 17; Creatinine, Ser 1.17; Potassium 4.3; Sodium 141    Lipid Panel     Component Value  Date/Time   CHOL 135 08/06/2022 0733   CHOL 126 12/12/2018 0810   TRIG 91.0 08/06/2022 0733   HDL 43.60 08/06/2022 0733   HDL 44 12/12/2018 0810   CHOLHDL 3 08/06/2022 0733   VLDL 18.2 08/06/2022 0733   LDLCALC 73 08/06/2022 0733   LDLCALC 67 12/12/2018 0810   LDLDIRECT 107.6 12/14/2011 0954     Wt Readings from Last 3 Encounters:  01/13/23 225 lb (102.1 kg)  10/20/22 223 lb 3.2 oz (101.2 kg)  10/08/22 221 lb (100.2 kg)      Other studies Reviewed: Additional studies/ records that were reviewed today include: TTE 10/19/22  Review of the above records today demonstrates:   1. Left ventricular ejection fraction, by estimation, is 50 to 55%. The  left ventricle has low normal function. The left ventricle has no regional  wall motion abnormalities. Left ventricular diastolic parameters are  consistent with Grade I diastolic  dysfunction (impaired relaxation).   2. Right ventricular systolic function is normal. The right ventricular  size is normal. Tricuspid regurgitation signal is inadequate for assessing  PA pressure.   3. The mitral valve is normal in structure. Trivial mitral valve  regurgitation. No evidence of mitral stenosis.   4. The aortic valve is tricuspid. Aortic valve regurgitation is trivial.  No aortic stenosis is present.   5.  The inferior vena cava is normal in size with greater than 50%  respiratory variability, suggesting right atrial pressure of 3 mmHg.    ASSESSMENT AND PLAN:  1.  Retinal artery occlusion: Thus far no causes been found.  Carotid Dopplers and echo without major abnormality.  He would benefit from ILR implant for atrial fibrillation monitoring.  Risk and benefits have been discussed.  Risk of bleeding and infection.  He understands the risks and is agreed to the procedure.  2.  Hypertension: well controlled  3.  Coronary artery disease: Elevated calcium score.  No current chest pain.  4.  Hyperlipidemia: Continue Crestor per primary physician    Current medicines are reviewed at length with the patient today.   The patient does not have concerns regarding his medicines.  The following changes were made today:  none  Labs/ tests ordered today include:  Orders Placed This Encounter  Procedures   EKG 12-Lead     Disposition:   FU with Grantley Savage  pending ILR results  Signed, Asaph Serena Jorja Loa, MD  01/13/2023 9:26 AM     [facility] [address] [place] [address] [phone] (office) [phone] (fax)  SURGEON:  Rapheal Masso Jorja Loa, MD     PREPROCEDURE DIAGNOSIS:  retinal artery occlusion    POSTPROCEDURE DIAGNOSIS: retinal artery occlusion     PROCEDURES:   1. Implantable loop recorder implantation    INTRODUCTION:  Jack Norris presents with a history of retinal artery occlusion The costs of loop recorder monitoring have been discussed with the patient. Appropriate time out was performed prior to the procedure.    DESCRIPTION OF PROCEDURE:  Informed written consent was obtained.  The patient required no sedation for the procedure today.  Mapping over the patient's chest was performed to identify the area where electrograms were most prominent for ILR recording.  This area was found to be the left parasternal region over the 4th  intercostal space. The patients left chest was therefore prepped and draped in the usual sterile fashion. The skin overlying the left parasternal region was infiltrated with lidocaine for local analgesia.  A 0.5-cm incision was made over the left parasternal region over the 3rd intercostal space.  A subcutaneous ILR pocket was fashioned using a combination of sharp and blunt dissection.  A [employer] Reveal LINQ (serial # [id number]) implantable loop recorder was then placed into the pocket  R waves were very prominent and measured 0.26mV.  Steri- Strips and a sterile dressing were then applied.  There were no early apparent complications.     CONCLUSIONS:   1. Successful implantation of a implantable loop recorder for a history of retinal artery occlusion  2. No early apparent complications.   Barbara Keng Jorja Loa, MD  01/13/2023 9:26 AM

## 2023-01-14 ENCOUNTER — Encounter: Payer: Self-pay | Admitting: Cardiology

## 2023-01-22 ENCOUNTER — Ambulatory Visit: Payer: Medicare HMO | Admitting: Adult Health

## 2023-01-29 ENCOUNTER — Encounter: Payer: Self-pay | Admitting: Family Medicine

## 2023-01-31 ENCOUNTER — Encounter: Payer: Self-pay | Admitting: Cardiology

## 2023-02-03 ENCOUNTER — Ambulatory Visit: Payer: Medicare HMO | Admitting: Adult Health

## 2023-02-03 ENCOUNTER — Encounter: Payer: Self-pay | Admitting: Adult Health

## 2023-02-03 VITALS — BP 130/68 | HR 95 | Temp 98.0°F | Ht 69.0 in | Wt 230.0 lb

## 2023-02-03 DIAGNOSIS — G4733 Obstructive sleep apnea (adult) (pediatric): Secondary | ICD-10-CM

## 2023-02-03 NOTE — Assessment & Plan Note (Signed)
Excellent control and compliance on nocturnal CPAP.  No changes  Plan  Patient Instructions  Continue on CPAP At bedtime, wear all night long.  Keep up good work.  Work on healthy weight.  Do not drive if sleepy Follow up with Dr. Vassie Loll  or Neville Pauls NP and As needed

## 2023-02-03 NOTE — Progress Notes (Signed)
@Patient  ID: Jack Norris, male    DOB: 09-12-1940, 82 y.o.   MRN: [id number]  Chief Complaint  Patient presents with   Follow-up    Referring provider: Excell Seltzer, MD  HPI: 82 year old male followed for obstructive sleep apnea and pulmonary nodules (stable on serial CT)  TEST/EVENTS :  07/2018-wt 206 pounds, TST 370 minutes, AHI 5.1/hour lowest desaturation 84%   07/2018 CPAP titration 7 cm 07/2018 CT coronaries-no lung nodules 11/2018 CT chest-decreasing lung nodules 12/2021 CT chest wo con >> stable nodules  02/03/2023 Follow up ; OSA  Patient presents for 1 year follow-up.  Patient has underlying sleep apnea.  He remains on CPAP at bedtime.  Patient says he wears a CPAP every single night.  He has perceived benefit as he has significantly decreased snoring while he wears his CPAP.  His sleep is more soundly and less fragmented.  CPAP download shows 100% compliance.  Daily average usage at 6 hours.  Patient is on CPAP 7 cm H2O.  AHI is 1.6/hr     Nasal pillows    Allergies  Allergen Reactions   Paroxetine     REACTION: Had mild personality change on this medication and doesn't want to take this again.   Morphine And Codeine     Causes bladder problems   Ciprofloxacin Other (See Comments)    ABDOMINAL PAIN/ sob   Flagyl [Metronidazole Hcl] Other (See Comments)    ABDOMINAL PAIN/ sob    Immunization History  Administered Date(s) Administered   Fluad Quad(high Dose 65+) 06/16/2019, 08/02/2020, 08/08/2021, 05/05/2022   Influenza Split 06/26/2011, 07/11/2012   Influenza Whole 05/24/2008, 06/24/2008, 07/04/2009, 05/24/2010   Influenza, High Dose Seasonal PF 06/14/2018   Influenza,inj,Quad PF,6+ Mos 07/26/2013, 06/12/2014, 04/15/2015, 04/16/2016, 07/01/2017   Influenza-Unspecified 08/08/2021   PFIZER(Purple Top)SARS-COV-2 Vaccination 10/04/2019, 10/25/2019, 06/07/2020, 03/10/2021   Pneumococcal Conjugate-13 04/15/2015   Pneumococcal Polysaccharide-23 09/14/2007    Td 08/02/2007   Tdap 12/15/2021   Zoster Recombinat (Shingrix) 01/13/2022, 04/01/2022   Zoster, Live 04/08/2009    Past Medical History:  Diagnosis Date   Asthma, mild    as child-only now if has URI   BPH (benign prostatic hypertrophy)    BPPV (benign paroxysmal positional vertigo)    Diverticulosis    Eczema    Fatty liver disease, nonalcoholic    Frequent nosebleeds    History of   GERD (gastroesophageal reflux disease)    occ uses TUMS as needed   Glaucoma    History of bronchitis    History of colon polyps    History of diverticulitis    History of kidney stones    Hyperlipemia    Hypertension    Influenza A 11/2017   Memory changes    OA (osteoarthritis)    Obesity    Overactive bladder 2020   PVCs (premature ventricular contractions)    Renal calculi right    AND LEFT X1 NON-OBSTRUCTIVE   Right sided sciatica    Bilateral buttock and thighs   Simple renal cyst right interpoler, simpler in nature , asymptomatic  per urologist note (dr dahlstedt)  12-26-2011   BENIGN, has been drained   Testosterone deficiency    Wears glasses    Wears hearing aid     Tobacco History: Social History   Tobacco Use  Smoking Status Former   Years: 8   Types: Cigarettes   Quit date: 01/05/1967   Years since quitting: 56.1  Smokeless Tobacco Never  Tobacco Comments  QUIT AGE 59   Counseling given: Not Answered Tobacco comments: QUIT AGE 59   Outpatient Medications Prior to Visit  Medication Sig Dispense Refill   albuterol (VENTOLIN HFA) 108 (90 Base) MCG/ACT inhaler Inhale 2 puffs into the lungs every 6 (six) hours as needed for wheezing or shortness of breath. 8 g 2   amLODipine (NORVASC) 10 MG tablet TAKE 1 TABLET BY MOUTH EVERY DAY 90 tablet 3   aspirin EC 325 MG tablet Take 325 mg by mouth daily.     B-D 3CC LUER-LOK SYR 18GX1-1/2 18G X 1-1/2" 3 ML MISC USE ONCE A WEEK AS DIRECTED  2   fenofibrate micronized (LOFIBRA) 67 MG capsule TAKE 1 CAPSULE(67 MG) BY  MOUTH DAILY BEFORE BREAKFAST 90 capsule 3   lisinopril (ZESTRIL) 40 MG tablet TAKE 1 TABLET(40 MG) BY MOUTH DAILY 90 tablet 3   LUTEIN PO Take 1 tablet by mouth daily.     mometasone (ELOCON) 0.1 % cream Apply 1 Application topically daily.     omeprazole (PRILOSEC) 20 MG capsule Take 20 mg by mouth daily. Per patient OTC     psyllium (METAMUCIL) 58.6 % powder Take 1 packet by mouth daily.     rosuvastatin (CRESTOR) 10 MG tablet Take 10 mg by mouth daily.     testosterone cypionate (DEPOTESTOSTERONE CYPIONATE) 200 MG/ML injection Inject 0.5 mLs (100 mg total) into the muscle every 7 (seven) days. 10 mL 0   triamcinolone (KENALOG) 0.025 % cream Apply 1 application topically as needed.     No facility-administered medications prior to visit.     Review of Systems:   Constitutional:   No  weight loss, night sweats,  Fevers, chills, fatigue, or  lassitude.  HEENT:   No headaches,  Difficulty swallowing,  Tooth/dental problems, or  Sore throat,                No sneezing, itching, ear ache, nasal congestion, post nasal drip,   CV:  No chest pain,  Orthopnea, PND, swelling in lower extremities, anasarca, dizziness, palpitations, syncope.   GI  No heartburn, indigestion, abdominal pain, nausea, vomiting, diarrhea, change in bowel habits, loss of appetite, bloody stools.   Resp: No shortness of breath with exertion or at rest.  No excess mucus, no productive cough,  No non-productive cough,  No coughing up of blood.  No change in color of mucus.  No wheezing.  No chest wall deformity  Skin: no rash or lesions.  GU: no dysuria, change in color of urine, no urgency or frequency.  No flank pain, no hematuria   MS:  No joint pain or swelling.  No decreased range of motion.  No back pain.    Physical Exam  BP 130/68 (BP Location: Left Arm, Patient Position: Sitting, Cuff Size: Normal)   Pulse 95   Temp 98 F (36.7 C) (Oral)   Ht 5\' 9"  (1.753 m)   Wt 230 lb (104.3 kg)   SpO2 96%   BMI  33.97 kg/m   GEN: A/Ox3; pleasant , NAD, well nourished    HEENT:  [place]/AT,  NOSE-clear, THROAT-clear, no lesions, no postnasal drip or exudate noted.   NECK:  Supple w/ fair ROM; no JVD; normal carotid impulses w/o bruits; no thyromegaly or nodules palpated; no lymphadenopathy.    RESP  Clear  P & A; w/o, wheezes/ rales/ or rhonchi. no accessory muscle use, no dullness to percussion  CARD:  RRR, no m/r/g, no peripheral edema, pulses intact, no  cyanosis or clubbing.  GI:   Soft & nt; nml bowel sounds; no organomegaly or masses detected.   Musco: Warm bil, no deformities or joint swelling noted.   Neuro: alert, no focal deficits noted.    Skin: Warm, no lesions or rashes    Lab Results:  CBC    BNP No results found for: "BNP"  ProBNP No results found for: "PROBNP"  Imaging: No results found.        No data to display          No results found for: "NITRICOXIDE"      Assessment & Plan:   OSA (obstructive sleep apnea) Excellent control and compliance on nocturnal CPAP.  No changes  Plan  Patient Instructions  Continue on CPAP At bedtime, wear all night long.  Keep up good work.  Work on healthy weight.  Do not drive if sleepy Follow up with Dr. Vassie Loll  or Shada Nienaber NP and As needed        Rubye Oaks, NP 02/03/2023

## 2023-02-03 NOTE — Patient Instructions (Addendum)
5 continue on CPAP At bedtime, wear all night long.  Keep up good work.  Work on healthy weight.  Do not drive if sleepy Follow up with Dr. Vassie Loll  or Terre Hanneman NP and As needed

## 2023-02-04 ENCOUNTER — Encounter: Payer: Self-pay | Admitting: Family Medicine

## 2023-02-04 ENCOUNTER — Ambulatory Visit (INDEPENDENT_AMBULATORY_CARE_PROVIDER_SITE_OTHER): Payer: Medicare HMO | Admitting: Family Medicine

## 2023-02-04 VITALS — BP 134/72 | HR 82 | Temp 97.4°F | Ht 69.0 in | Wt 226.0 lb

## 2023-02-04 DIAGNOSIS — R21 Rash and other nonspecific skin eruption: Secondary | ICD-10-CM | POA: Insufficient documentation

## 2023-02-04 DIAGNOSIS — H34232 Retinal artery branch occlusion, left eye: Secondary | ICD-10-CM

## 2023-02-04 NOTE — Progress Notes (Addendum)
Patient ID: WILLIIAM Norris, male    DOB: 08/06/1941, 82 y.o.   MRN: [id number]  This visit was conducted in person.  BP 134/72   Pulse 82   Temp (!) 97.4 F (36.3 C) (Temporal)   Ht 5\' 9"  (1.753 m)   Wt 226 lb (102.5 kg)   SpO2 96%   BMI 33.37 kg/m    CC:  Chief Complaint  Patient presents with  . Skin Problem    C/o skin rash and dry patches. Started 1-2 mos ago. Areas are extremely itchy. Followed by derm but can't see them for several mos. Currently uses 2 creams, barely helpful.     Subjective:   HPI: [name] is a 82 y.o. male presenting on 02/04/2023 for Skin Problem (C/o skin rash and dry patches. Started 1-2 mos ago. Areas are extremely itchy. Followed by derm but can't see them for several mos. Currently uses 2 creams, barely helpful. )   Worsening  skin rash in the last 1 to 2 months.  Has dermatologist but cannot be seen for several months. Describes the rash as itchy, patchy. Spreading up to upper legs, back and  upper arms. No oral or palm are component.  No new changes in medication, known new exposures.  He has tried to treat with topical  mometasone 0.1 % or triamcinolone compounded with triamcinolone.  Applies Cerava body wash.    History of  ichthyosis and eczema but it has been below only on ankles before.  Retinal artery occlusion carotid Dopplers and echo without abnormality.  Dr Elberta Fortis has placed a Medtronic implant for atrial fibrillation monitoring.  Relevant past medical, surgical, family and social history reviewed and updated as indicated. Interim medical history since our last visit reviewed. Allergies and medications reviewed and updated. Outpatient Medications Prior to Visit  Medication Sig Dispense Refill  . albuterol (VENTOLIN HFA) 108 (90 Base) MCG/ACT inhaler Inhale 2 puffs into the lungs every 6 (six) hours as needed for wheezing or shortness of breath. 8 g 2  . amLODipine (NORVASC) 10 MG tablet TAKE 1 TABLET BY MOUTH  EVERY DAY 90 tablet 3  . aspirin EC 325 MG tablet Take 325 mg by mouth daily.    . B-D 3CC LUER-LOK SYR 18GX1-1/2 18G X 1-1/2" 3 ML MISC USE ONCE A WEEK AS DIRECTED  2  . fenofibrate micronized (LOFIBRA) 67 MG capsule TAKE 1 CAPSULE(67 MG) BY MOUTH DAILY BEFORE BREAKFAST 90 capsule 3  . lisinopril (ZESTRIL) 40 MG tablet TAKE 1 TABLET(40 MG) BY MOUTH DAILY 90 tablet 3  . LUTEIN PO Take 1 tablet by mouth daily.    . mometasone (ELOCON) 0.1 % cream Apply 1 Application topically daily.    Marland Kitchen omeprazole (PRILOSEC) 20 MG capsule Take 20 mg by mouth daily. Per patient OTC    . psyllium (METAMUCIL) 58.6 % powder Take 1 packet by mouth daily.    . rosuvastatin (CRESTOR) 10 MG tablet Take 10 mg by mouth daily.    Marland Kitchen testosterone cypionate (DEPOTESTOSTERONE CYPIONATE) 200 MG/ML injection Inject 0.5 mLs (100 mg total) into the muscle every 7 (seven) days. 10 mL 0  . triamcinolone (KENALOG) 0.025 % cream Apply 1 application topically as needed.     No facility-administered medications prior to visit.     Per HPI unless specifically indicated in ROS section below Review of Systems  Constitutional:  Negative for fatigue and fever.  HENT:  Negative for ear pain.   Eyes:  Negative  for pain.  Respiratory:  Negative for cough and shortness of breath.   Cardiovascular:  Negative for chest pain, palpitations and leg swelling.  Gastrointestinal:  Negative for abdominal pain.  Genitourinary:  Negative for dysuria.  Musculoskeletal:  Negative for arthralgias.  Neurological:  Negative for syncope, light-headedness and headaches.  Psychiatric/Behavioral:  Negative for dysphoric mood.    Objective:  BP 134/72   Pulse 82   Temp (!) 97.4 F (36.3 C) (Temporal)   Ht 5\' 9"  (1.753 m)   Wt 226 lb (102.5 kg)   SpO2 96%   BMI 33.37 kg/m   Wt Readings from Last 3 Encounters:  02/04/23 226 lb (102.5 kg)  02/03/23 230 lb (104.3 kg)  01/13/23 225 lb (102.1 kg)      Physical Exam Constitutional:       Appearance: He is well-developed.  HENT:     Head: Normocephalic.     Right Ear: Hearing normal.     Left Ear: Hearing normal.     Nose: Nose normal.  Neck:     Thyroid: No thyroid mass or thyromegaly.     Vascular: No carotid bruit.     Trachea: Trachea normal.  Cardiovascular:     Rate and Rhythm: Normal rate and regular rhythm.     Pulses: Normal pulses.     Heart sounds: Heart sounds not distant. No murmur heard.    No friction rub. No gallop.     Comments: No peripheral edema Pulmonary:     Effort: Pulmonary effort is normal. No respiratory distress.     Breath sounds: Normal breath sounds.  Skin:    General: Skin is warm and dry.     Findings: No rash.     Comments: Round dry flaky red patches on upper arms, upper legs and torso Overall extremely dry skin suggestive of c/o cysts  Psychiatric:        Speech: Speech normal.        Behavior: Behavior normal.        Thought Content: Thought content normal.      Results for orders placed or performed during the hospital encounter of 10/19/22  ECHOCARDIOGRAM COMPLETE  Result Value Ref Range   S' Lateral 4.50 cm   Single Plane A4C EF 53.6 %   Area-P 1/2 2.83 cm2   MV M vel 4.02 m/s   MV Peak grad 64.6 mmHg   P 1/2 time 865 msec   Est EF 50 - 55%     Assessment and Plan  Rash Assessment & Plan: Chronic eczema, now with acute worsening rash in setting of baseline ichthyosis. Rash does seem to be most consistent with eczema but did discuss biopsy as an option if not improving given surprising minimal improvement with mometasone high-dose steroids. Recommended addition of Allegra at bedtime as antihistamine.  If not improving as expected follow-up with dermatology.  Patient feels well, if not persisting could consider workup to rule out internal causes of itching and rash such as renal or liver issue.   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  Dr  Elberta Fortis has placed a Medtronic implant for atrial fibrillation monitoring.     No follow-ups on file.   Kerby Nora, MD

## 2023-02-04 NOTE — Assessment & Plan Note (Signed)
Carotid Dopplers and echo without abnormality.  Dr Elberta Fortis has placed a Medtronic implant for atrial fibrillation monitoring.

## 2023-02-04 NOTE — Patient Instructions (Signed)
Start allegra at bedtime as an antihistamine.  Can try a trial off the eye supplement.

## 2023-02-04 NOTE — Assessment & Plan Note (Signed)
Chronic eczema, now with acute worsening rash in setting of baseline ichthyosis. Rash does seem to be most consistent with eczema but did discuss biopsy as an option if not improving given surprising minimal improvement with mometasone high-dose steroids. Recommended addition of Allegra at bedtime as antihistamine.  If not improving as expected follow-up with dermatology.  Patient feels well, if not persisting could consider workup to rule out internal causes of itching and rash such as renal or liver issue.

## 2023-02-09 DIAGNOSIS — H8110 Benign paroxysmal vertigo, unspecified ear: Secondary | ICD-10-CM | POA: Diagnosis not present

## 2023-02-09 DIAGNOSIS — H903 Sensorineural hearing loss, bilateral: Secondary | ICD-10-CM | POA: Diagnosis not present

## 2023-02-09 DIAGNOSIS — H6123 Impacted cerumen, bilateral: Secondary | ICD-10-CM | POA: Diagnosis not present

## 2023-02-11 ENCOUNTER — Encounter: Payer: Self-pay | Admitting: Cardiology

## 2023-02-16 ENCOUNTER — Ambulatory Visit (INDEPENDENT_AMBULATORY_CARE_PROVIDER_SITE_OTHER): Payer: Medicare HMO

## 2023-02-16 DIAGNOSIS — I4891 Unspecified atrial fibrillation: Secondary | ICD-10-CM

## 2023-02-17 LAB — CUP PACEART REMOTE DEVICE CHECK
Date Time Interrogation Session: 20240625141818
Implantable Pulse Generator Implant Date: 20240522

## 2023-02-18 DIAGNOSIS — H353122 Nonexudative age-related macular degeneration, left eye, intermediate dry stage: Secondary | ICD-10-CM | POA: Diagnosis not present

## 2023-02-18 DIAGNOSIS — H43811 Vitreous degeneration, right eye: Secondary | ICD-10-CM | POA: Diagnosis not present

## 2023-02-18 DIAGNOSIS — H34832 Tributary (branch) retinal vein occlusion, left eye, with macular edema: Secondary | ICD-10-CM | POA: Diagnosis not present

## 2023-02-18 DIAGNOSIS — H34232 Retinal artery branch occlusion, left eye: Secondary | ICD-10-CM | POA: Diagnosis not present

## 2023-02-18 DIAGNOSIS — H35373 Puckering of macula, bilateral: Secondary | ICD-10-CM | POA: Diagnosis not present

## 2023-03-11 DIAGNOSIS — R42 Dizziness and giddiness: Secondary | ICD-10-CM | POA: Diagnosis not present

## 2023-03-11 NOTE — Progress Notes (Signed)
Carelink Summary Report / Loop Recorder 

## 2023-03-14 ENCOUNTER — Encounter: Payer: Self-pay | Admitting: Family Medicine

## 2023-03-15 NOTE — Telephone Encounter (Signed)
FYI

## 2023-03-17 ENCOUNTER — Ambulatory Visit: Payer: Medicare HMO | Admitting: Family Medicine

## 2023-03-22 ENCOUNTER — Ambulatory Visit (INDEPENDENT_AMBULATORY_CARE_PROVIDER_SITE_OTHER): Payer: Medicare HMO

## 2023-03-22 DIAGNOSIS — I4891 Unspecified atrial fibrillation: Secondary | ICD-10-CM | POA: Diagnosis not present

## 2023-04-06 DIAGNOSIS — H43811 Vitreous degeneration, right eye: Secondary | ICD-10-CM | POA: Diagnosis not present

## 2023-04-06 DIAGNOSIS — H35373 Puckering of macula, bilateral: Secondary | ICD-10-CM | POA: Diagnosis not present

## 2023-04-06 DIAGNOSIS — H353122 Nonexudative age-related macular degeneration, left eye, intermediate dry stage: Secondary | ICD-10-CM | POA: Diagnosis not present

## 2023-04-06 DIAGNOSIS — H34832 Tributary (branch) retinal vein occlusion, left eye, with macular edema: Secondary | ICD-10-CM | POA: Diagnosis not present

## 2023-04-06 DIAGNOSIS — H34232 Retinal artery branch occlusion, left eye: Secondary | ICD-10-CM | POA: Diagnosis not present

## 2023-04-07 NOTE — Progress Notes (Signed)
Carelink Summary Report / Loop Recorder 

## 2023-04-08 DIAGNOSIS — E291 Testicular hypofunction: Secondary | ICD-10-CM | POA: Diagnosis not present

## 2023-04-08 DIAGNOSIS — R948 Abnormal results of function studies of other organs and systems: Secondary | ICD-10-CM | POA: Diagnosis not present

## 2023-04-08 NOTE — Progress Notes (Signed)
Cardiology Office Note   Date:  04/09/2023   ID:  Jack Norris, DOB June 07, 1941, MRN [id number]  PCP:  Excell Seltzer, MD    No chief complaint on file.  CAD  Wt Readings from Last 3 Encounters:  04/09/23 228 lb 6.4 oz (103.6 kg)  02/04/23 226 lb (102.5 kg)  02/03/23 230 lb (104.3 kg)       History of Present Illness: Jack Norris is a 82 y.o. male  With hyperlipidemia.  He has been on lopid for years.     He has had palpitations in the past.  No management with a cardiologist at that time.  Sx have resolved.   He has lost weight in the past.   His father had CAD, CABG x 4, chronic smoker.  Mother passed away from cancer at 95.     Stress test in 2018: Blood pressure demonstrated a hypertensive response to exercise. There was no ST segment deviation noted during stress.   No ischemia. Very frequent monomorphic PVCs during peak stress and occasional PVCs in recovery. Normal exercise tolerance. Hypertensive response to exercise with max blood pressure 213/87 mmHg.   He had the flu in early 2019.    In late 2019, he had some chest discomfort while at the dentist.   He underwent CT Angio of the coronary arteries.  FFR in all 3 coronary distributions was essentially negative, with the exception of the very distal LAD.  However, no stenosis was identified by angiography.  There was suspicion of a PDA lesion of significance, but not mention to be significant by FFR.  At that point, we told him to watch for symptoms.  Any further symptoms would have [place] perform coronary angiography.   In 2020, it was noted that he has gained weight.  He has not been exercising much.  BP was high.  He saw his PMD.     He had an MRI of the brain in 07/2019, which was unremarkable.   BP was better controlled after the lisinopril was increased.   In 2021, he has had some neck pain when turns his head a certain way.  He has had issues with grinding his teeth as well.  He had botox to  help with this in early in early 2021.  ENT eval was negative.  Omeprazole was added.   Had left eye surgery.    Later found to have branch retinal artery occlusion. Found on routine exam-no visual symptoms.   echo and Dopplers showed no occlusion. Echo normal LVEF Grade 1DD.   Monitor showed: "Normal sinus rhythm with rare PACs and rare PVCs.   Rare, brief runs of PACs and PVCs.  No sustained arrhythmias.   No atrial fibrillation.   No palpitations reported.  ILR placed per Dr. Elberta Fortis.  Has gained weight.    Past Medical History:  Diagnosis Date   Asthma, mild    as child-only now if has URI   BPH (benign prostatic hypertrophy)    BPPV (benign paroxysmal positional vertigo)    Diverticulosis    Eczema    Fatty liver disease, nonalcoholic    Frequent nosebleeds    History of   GERD (gastroesophageal reflux disease)    occ uses TUMS as needed   Glaucoma    History of bronchitis    History of colon polyps    History of diverticulitis    History of kidney stones    Hyperlipemia    Hypertension  Influenza A 11/2017   Memory changes    OA (osteoarthritis)    Obesity    Overactive bladder 2020   PVCs (premature ventricular contractions)    Renal calculi right    AND LEFT X1 NON-OBSTRUCTIVE   Right sided sciatica    Bilateral buttock and thighs   Simple renal cyst right interpoler, simpler in nature , asymptomatic  per urologist note (dr dahlstedt)  12-26-2011   BENIGN, has been drained   Testosterone deficiency    Wears glasses    Wears hearing aid     Past Surgical History:  Procedure Laterality Date   APPENDECTOMY     bone spur removal     BILATERAL GREAT TOE   COLONOSCOPY     CYSTO/ RIGHT RETROGRADE PYELOGRAM/ RIGHT URETER DILATATION/ RIGHT URETEROSCOPY/ LASER LITHOTRIPSY RIGHT RENAL PELVIS AND LOWER POLE CALCULI/ PLACEMENT STENT  01-11-2012  DR DAHLSTEDT   RIGHT RENAL PELVIS AND LOWER POLE CALCULI   CYSTOSCOPY W/ RETROGRADES  06/02/2012   Procedure:  CYSTOSCOPY WITH RETROGRADE PYELOGRAM;  Surgeon: Marcine Matar, MD;  Location: [facility];  Service: Urology;  Laterality: Right;   CYSTOSCOPY WITH URETEROSCOPY  06/02/2012   Procedure: CYSTOSCOPY WITH URETEROSCOPY;  Surgeon: Marcine Matar, MD;  Location: [facility];  Service: Urology;  Laterality: Right;  90 MIN ALSO EXTRACTION OF CALCULI    EYE SURGERY Bilateral 2020   LITHOTRIPSY  APPROX.  1998   ESWL  left kidney x 2   ROTATOR CUFF REPAIR  2012   LEFT SHOULDER   SHOULDER ARTHROSCOPY WITH ROTATOR CUFF REPAIR AND SUBACROMIAL DECOMPRESSION  09/12/2012   Procedure: SHOULDER ARTHROSCOPY WITH ROTATOR CUFF REPAIR AND SUBACROMIAL DECOMPRESSION;  Surgeon: Mable Paris, MD;  Location: [facility];  Service: Orthopedics;  Laterality: Right;  see above and biceps tendonotomy   SKIN LESION EXCISION Right 07/07/2021   right side of face near right ear   TRANSURETHRAL RESECTION OF BLADDER NECK N/A 04/08/2018   Procedure: TRANSURETHRAL RESECTION OF BLADDER NECK CONTRACTURE;  Surgeon: Marcine Matar, MD;  Location: [facility];  Service: Urology;  Laterality: N/A;   TRANSURETHRAL RESECTION OF PROSTATE  11/29/2009   BPH   VASECTOMY  1970'S   GEN. ANES.     Current Outpatient Medications  Medication Sig Dispense Refill   albuterol (VENTOLIN HFA) 108 (90 Base) MCG/ACT inhaler Inhale 2 puffs into the lungs every 6 (six) hours as needed for wheezing or shortness of breath. 8 g 2   amLODipine (NORVASC) 10 MG tablet TAKE 1 TABLET BY MOUTH EVERY DAY 90 tablet 3   aspirin EC 325 MG tablet Take 325 mg by mouth daily.     B-D 3CC LUER-LOK SYR 18GX1-1/2 18G X 1-1/2" 3 ML MISC USE ONCE A WEEK AS DIRECTED  2   fenofibrate micronized (LOFIBRA) 67 MG capsule TAKE 1 CAPSULE(67 MG) BY MOUTH DAILY BEFORE BREAKFAST 90 capsule 3   lisinopril (ZESTRIL) 40 MG tablet TAKE 1 TABLET(40 MG) BY MOUTH DAILY 90 tablet 3   mometasone (ELOCON) 0.1  % cream Apply 1 Application topically daily.     omeprazole (PRILOSEC) 20 MG capsule Take 20 mg by mouth daily. Per patient OTC     psyllium (METAMUCIL) 58.6 % powder Take 1 packet by mouth daily.     rosuvastatin (CRESTOR) 10 MG tablet Take 10 mg by mouth daily.     testosterone cypionate (DEPOTESTOSTERONE CYPIONATE) 200 MG/ML injection Inject 0.5 mLs (100 mg total) into the muscle every  7 (seven) days. 10 mL 0   triamcinolone (KENALOG) 0.025 % cream Apply 1 application topically as needed.     No current facility-administered medications for this visit.    Allergies:   Paroxetine, Morphine and codeine, Ciprofloxacin, and Flagyl [metronidazole hcl]    Social History:  The patient  reports that he quit smoking about 56 years ago. His smoking use included cigarettes. He started smoking about 64 years ago. He has never used smokeless tobacco. He reports current alcohol use. He reports that he does not use drugs.   Family History:  The patient's family history includes Cancer in his father and mother; Heart disease in his father; Squamous cell carcinoma in his mother.    ROS:  Please see the history of present illness.   Otherwise, review of systems are positive for recent eye issues, getting injections.   All other systems are reviewed and negative.    PHYSICAL EXAM: VS:  BP 128/70   Pulse 81   Ht 5\' 9"  (1.753 m)   Wt 228 lb 6.4 oz (103.6 kg)   SpO2 98%   BMI 33.73 kg/m  , BMI Body mass index is 33.73 kg/m. GEN: Well nourished, well developed, in no acute distress HEENT: normal Neck: no JVD, carotid bruits, or masses Cardiac: RRR; no murmurs, rubs, or gallops,no edema  Respiratory:  clear to auscultation bilaterally, normal work of breathing GI: soft, nontender, nondistended, + BS, obese MS: no deformity or atrophy Skin: warm and dry, no rash Neuro:  Strength and sensation are intact Psych: euthymic mood, full affect   EKG:   The ekg ordered 2/24 demonstrates NSR   Recent  Labs: 08/06/2022: ALT 14; BUN 17; Creatinine, Ser 1.17; Potassium 4.3; Sodium 141   Lipid Panel    Component Value Date/Time   CHOL 135 08/06/2022 0733   CHOL 126 12/12/2018 0810   TRIG 91.0 08/06/2022 0733   HDL 43.60 08/06/2022 0733   HDL 44 12/12/2018 0810   CHOLHDL 3 08/06/2022 0733   VLDL 18.2 08/06/2022 0733   LDLCALC 73 08/06/2022 0733   LDLCALC 67 12/12/2018 0810   LDLDIRECT 107.6 12/14/2011 0954     Other studies Reviewed: Additional studies/ records that were reviewed today with results demonstrating: labs reviewed, LDL 73 in 07/2022.   ASSESSMENT AND PLAN:  CAD: No angina on medical therapy.  Continue aggressive secondary prevention.  He does have coronary calcification noted.  He has had calcification noted in peripheral vessels as well.  We talked about statin therapy, healthy diet. DM: Diet controlled in the past. Now prediabetic.  Consider metformin.  Try to lose weight.  Dietary recommendations and exercise recommendations discussed. HTN: The current medical regimen is effective;  continue present plan and medications. Obesity: Whole food plant-based diet.  High-fiber diet.  Avoid processed foods. FH CAD: COntinue preventive therapy.  PVCs: No sx.  ILR has an App associated that will stop when the phone restarts.    Current medicines are reviewed at length with the patient today.  The patient concerns regarding his medicines were addressed.  The following changes have been made:  No change  Labs/ tests ordered today include:  No orders of the defined types were placed in this encounter.   Recommend 150 minutes/week of aerobic exercise Low fat, low carb, high fiber diet recommended  Disposition:   FU in 1 year   Signed, Lance Muss, MD  04/09/2023 9:03 AM    [facility] [address] [place],  [place], [place]  [address] Phone: [phone]; Fax: [phone]

## 2023-04-09 ENCOUNTER — Encounter: Payer: Self-pay | Admitting: Interventional Cardiology

## 2023-04-09 ENCOUNTER — Ambulatory Visit: Payer: Medicare HMO | Attending: Interventional Cardiology | Admitting: Interventional Cardiology

## 2023-04-09 VITALS — BP 128/70 | HR 81 | Ht 69.0 in | Wt 228.4 lb

## 2023-04-09 DIAGNOSIS — H34232 Retinal artery branch occlusion, left eye: Secondary | ICD-10-CM

## 2023-04-09 DIAGNOSIS — I493 Ventricular premature depolarization: Secondary | ICD-10-CM | POA: Diagnosis not present

## 2023-04-09 DIAGNOSIS — I2510 Atherosclerotic heart disease of native coronary artery without angina pectoris: Secondary | ICD-10-CM

## 2023-04-09 DIAGNOSIS — E782 Mixed hyperlipidemia: Secondary | ICD-10-CM | POA: Diagnosis not present

## 2023-04-09 DIAGNOSIS — I10 Essential (primary) hypertension: Secondary | ICD-10-CM

## 2023-04-09 DIAGNOSIS — I4891 Unspecified atrial fibrillation: Secondary | ICD-10-CM

## 2023-04-09 NOTE — Patient Instructions (Signed)
Medication Instructions:  Your physician recommends that you continue on your current medications as directed. Please refer to the Current Medication list given to you today.  *If you need a refill on your cardiac medications before your next appointment, please call your pharmacy*   Lab Work: none If you have labs (blood work) drawn today and your tests are completely normal, you will receive your results only by: MyChart Message (if you have MyChart) OR A paper copy in the mail If you have any lab test that is abnormal or we need to change your treatment, we will call you to review the results.   Testing/Procedures: none   Follow-Up: At [facility], you and your health needs are our priority.  As part of our continuing mission to provide you with exceptional heart care, we have created designated Provider Care Teams.  These Care Teams include your primary Cardiologist (physician) and Advanced Practice Providers (APPs -  Physician Assistants and Nurse Practitioners) who all work together to provide you with the care you need, when you need it.  We recommend signing up for the patient portal called "MyChart".  Sign up information is provided on this After Visit Summary.  MyChart is used to connect with patients for Virtual Visits (Telemedicine).  Patients are able to view lab/test results, encounter notes, upcoming appointments, etc.  Non-urgent messages can be sent to your provider as well.   To learn more about what you can do with MyChart, go to [email or web address].    Your next appointment:   12 month(s)  Provider:   Dr Clifton James, Dr Excell Seltzer, Dr Herbie Baltimore, Dr Lynnette Caffey     Other Instructions

## 2023-04-12 DIAGNOSIS — B351 Tinea unguium: Secondary | ICD-10-CM | POA: Diagnosis not present

## 2023-04-12 DIAGNOSIS — L300 Nummular dermatitis: Secondary | ICD-10-CM | POA: Diagnosis not present

## 2023-04-12 DIAGNOSIS — B353 Tinea pedis: Secondary | ICD-10-CM | POA: Diagnosis not present

## 2023-04-15 DIAGNOSIS — N401 Enlarged prostate with lower urinary tract symptoms: Secondary | ICD-10-CM | POA: Diagnosis not present

## 2023-04-15 DIAGNOSIS — R350 Frequency of micturition: Secondary | ICD-10-CM | POA: Diagnosis not present

## 2023-04-15 DIAGNOSIS — E291 Testicular hypofunction: Secondary | ICD-10-CM | POA: Diagnosis not present

## 2023-04-25 LAB — CUP PACEART REMOTE DEVICE CHECK
Date Time Interrogation Session: 20240830141817
Implantable Pulse Generator Implant Date: 20240522

## 2023-04-27 ENCOUNTER — Ambulatory Visit (INDEPENDENT_AMBULATORY_CARE_PROVIDER_SITE_OTHER): Payer: Medicare HMO

## 2023-04-27 DIAGNOSIS — I4891 Unspecified atrial fibrillation: Secondary | ICD-10-CM | POA: Diagnosis not present

## 2023-04-30 ENCOUNTER — Other Ambulatory Visit: Payer: Self-pay | Admitting: Interventional Cardiology

## 2023-04-30 ENCOUNTER — Encounter: Payer: Self-pay | Admitting: Interventional Cardiology

## 2023-05-03 DIAGNOSIS — Z86018 Personal history of other benign neoplasm: Secondary | ICD-10-CM | POA: Diagnosis not present

## 2023-05-03 DIAGNOSIS — Z872 Personal history of diseases of the skin and subcutaneous tissue: Secondary | ICD-10-CM | POA: Diagnosis not present

## 2023-05-03 DIAGNOSIS — L300 Nummular dermatitis: Secondary | ICD-10-CM | POA: Diagnosis not present

## 2023-05-03 DIAGNOSIS — Z85828 Personal history of other malignant neoplasm of skin: Secondary | ICD-10-CM | POA: Diagnosis not present

## 2023-05-03 DIAGNOSIS — L578 Other skin changes due to chronic exposure to nonionizing radiation: Secondary | ICD-10-CM | POA: Diagnosis not present

## 2023-05-03 DIAGNOSIS — Q801 X-linked ichthyosis: Secondary | ICD-10-CM | POA: Diagnosis not present

## 2023-05-03 DIAGNOSIS — B353 Tinea pedis: Secondary | ICD-10-CM | POA: Diagnosis not present

## 2023-05-03 DIAGNOSIS — Z859 Personal history of malignant neoplasm, unspecified: Secondary | ICD-10-CM | POA: Diagnosis not present

## 2023-05-03 DIAGNOSIS — E7529 Other sphingolipidosis: Secondary | ICD-10-CM | POA: Diagnosis not present

## 2023-05-05 NOTE — Progress Notes (Signed)
Carelink Summary Report / Loop Recorder 

## 2023-05-07 ENCOUNTER — Encounter: Payer: Self-pay | Admitting: Family Medicine

## 2023-05-27 ENCOUNTER — Other Ambulatory Visit: Payer: Self-pay | Admitting: Interventional Cardiology

## 2023-05-27 DIAGNOSIS — Z79899 Other long term (current) drug therapy: Secondary | ICD-10-CM

## 2023-05-27 DIAGNOSIS — I10 Essential (primary) hypertension: Secondary | ICD-10-CM

## 2023-05-31 ENCOUNTER — Ambulatory Visit (INDEPENDENT_AMBULATORY_CARE_PROVIDER_SITE_OTHER): Payer: Medicare HMO

## 2023-05-31 DIAGNOSIS — I4891 Unspecified atrial fibrillation: Secondary | ICD-10-CM | POA: Diagnosis not present

## 2023-05-31 LAB — CUP PACEART REMOTE DEVICE CHECK
Date Time Interrogation Session: 20241006230525
Implantable Pulse Generator Implant Date: 20240522

## 2023-06-04 DIAGNOSIS — H903 Sensorineural hearing loss, bilateral: Secondary | ICD-10-CM | POA: Diagnosis not present

## 2023-06-09 DIAGNOSIS — H35371 Puckering of macula, right eye: Secondary | ICD-10-CM | POA: Diagnosis not present

## 2023-06-09 DIAGNOSIS — H34832 Tributary (branch) retinal vein occlusion, left eye, with macular edema: Secondary | ICD-10-CM | POA: Diagnosis not present

## 2023-06-09 DIAGNOSIS — H532 Diplopia: Secondary | ICD-10-CM | POA: Diagnosis not present

## 2023-06-09 DIAGNOSIS — H40051 Ocular hypertension, right eye: Secondary | ICD-10-CM | POA: Diagnosis not present

## 2023-06-09 DIAGNOSIS — H34232 Retinal artery branch occlusion, left eye: Secondary | ICD-10-CM | POA: Diagnosis not present

## 2023-06-09 DIAGNOSIS — H04123 Dry eye syndrome of bilateral lacrimal glands: Secondary | ICD-10-CM | POA: Diagnosis not present

## 2023-06-09 DIAGNOSIS — Z961 Presence of intraocular lens: Secondary | ICD-10-CM | POA: Diagnosis not present

## 2023-06-09 DIAGNOSIS — H5021 Vertical strabismus, right eye: Secondary | ICD-10-CM | POA: Diagnosis not present

## 2023-06-09 DIAGNOSIS — H43811 Vitreous degeneration, right eye: Secondary | ICD-10-CM | POA: Diagnosis not present

## 2023-06-14 NOTE — Progress Notes (Signed)
Carelink Summary Report / Loop Recorder 

## 2023-06-18 DIAGNOSIS — H903 Sensorineural hearing loss, bilateral: Secondary | ICD-10-CM | POA: Diagnosis not present

## 2023-06-28 ENCOUNTER — Other Ambulatory Visit: Payer: Self-pay | Admitting: Interventional Cardiology

## 2023-07-05 ENCOUNTER — Ambulatory Visit: Payer: Medicare HMO

## 2023-07-05 DIAGNOSIS — I4891 Unspecified atrial fibrillation: Secondary | ICD-10-CM | POA: Diagnosis not present

## 2023-07-06 DIAGNOSIS — H43811 Vitreous degeneration, right eye: Secondary | ICD-10-CM | POA: Diagnosis not present

## 2023-07-06 DIAGNOSIS — H34232 Retinal artery branch occlusion, left eye: Secondary | ICD-10-CM | POA: Diagnosis not present

## 2023-07-06 DIAGNOSIS — H353122 Nonexudative age-related macular degeneration, left eye, intermediate dry stage: Secondary | ICD-10-CM | POA: Diagnosis not present

## 2023-07-06 DIAGNOSIS — H34832 Tributary (branch) retinal vein occlusion, left eye, with macular edema: Secondary | ICD-10-CM | POA: Diagnosis not present

## 2023-07-06 DIAGNOSIS — H35373 Puckering of macula, bilateral: Secondary | ICD-10-CM | POA: Diagnosis not present

## 2023-07-06 LAB — CUP PACEART REMOTE DEVICE CHECK
Date Time Interrogation Session: 20241110232142
Implantable Pulse Generator Implant Date: 20240522

## 2023-07-15 ENCOUNTER — Ambulatory Visit (INDEPENDENT_AMBULATORY_CARE_PROVIDER_SITE_OTHER): Payer: Medicare HMO

## 2023-07-15 VITALS — Ht 69.0 in | Wt 230.0 lb

## 2023-07-15 DIAGNOSIS — Z0000 Encounter for general adult medical examination without abnormal findings: Secondary | ICD-10-CM

## 2023-07-15 NOTE — Patient Instructions (Signed)
Jack Norris , Thank you for taking time to come for your Medicare Wellness Visit. I appreciate your ongoing commitment to your health goals. Please review the following plan we discussed and let me know if I can assist you in the future.   Referrals/Orders/Follow-Ups/Clinician Recommendations: No  This is a list of the screening recommended for you and due dates:  Health Maintenance  Topic Date Due   Flu Shot  03/25/2023   COVID-19 Vaccine (5 - 2023-24 season) 04/25/2023   Medicare Annual Wellness Visit  07/14/2024   DTaP/Tdap/Td vaccine (3 - Td or Tdap) 12/16/2031   Pneumonia Vaccine  Completed   Zoster (Shingles) Vaccine  Completed   HPV Vaccine  Aged Out    Advanced directives: (Copy Requested) Please bring a copy of your health care power of attorney and living will to the office to be added to your chart at your convenience.  Next Medicare Annual Wellness Visit scheduled for next year: No

## 2023-07-15 NOTE — Progress Notes (Signed)
Subjective:   Jack Norris is a 82 y.o. male who presents for Medicare Annual/Subsequent preventive examination.  Visit Complete: Virtual I connected with  Jack Norris on 07/15/23 by a audio enabled telemedicine application and verified that I am speaking with the correct person using two identifiers.  Patient Location: Home  Provider Location: Office/Clinic  I discussed the limitations of evaluation and management by telemedicine. The patient expressed understanding and agreed to proceed.  Vital Signs: Because this visit was a virtual/telehealth visit, some criteria may be missing or patient reported. Any vitals not documented were not able to be obtained and vitals that have been documented are patient reported.  Patient Medicare AWV questionnaire was completed by the patient on 07/13/2023; I have confirmed that all information answered by patient is correct and no changes since this date.  Cardiac Risk Factors include: advanced age (>45men, >18 women);dyslipidemia;family history of premature cardiovascular disease;hypertension;male gender;obesity (BMI >30kg/m2)     Objective:    Today's Vitals   07/15/23 1122 07/15/23 1143  Weight: 230 lb (104.3 kg)   Height: 5\' 9"  (1.753 m)   PainSc: 0-No pain 0-No pain   Body mass index is 33.97 kg/m.     07/15/2023   11:23 AM 08/10/2022    3:15 PM 07/11/2021   12:24 PM 07/10/2020    2:50 PM 06/15/2019   12:16 PM 04/22/2018    9:49 AM 04/08/2018    9:50 AM  Advanced Directives  Does Patient Have a Medical Advance Directive? Yes Yes No No No No No  Type of Estate agent of [place];Living will Healthcare Power of Section;Living will       Copy of Healthcare Power of Attorney in Chart? No - copy requested No - copy requested       Would patient like information on creating a medical advance directive?   Yes (MAU/Ambulatory/Procedural Areas - Information given) No - Patient declined No - Patient  declined No - Patient declined No - Patient declined    Current Medications (verified) Outpatient Encounter Medications as of 07/15/2023  Medication Sig   albuterol (VENTOLIN HFA) 108 (90 Base) MCG/ACT inhaler Inhale 2 puffs into the lungs every 6 (six) hours as needed for wheezing or shortness of breath.   amLODipine (NORVASC) 10 MG tablet TAKE 1 TABLET BY MOUTH EVERY DAY   aspirin EC 325 MG tablet Take 325 mg by mouth daily.   B-D 3CC LUER-LOK SYR 18GX1-1/2 18G X 1-1/2" 3 ML MISC USE ONCE A WEEK AS DIRECTED   fenofibrate micronized (LOFIBRA) 67 MG capsule TAKE 1 CAPSULE(67 MG) BY MOUTH DAILY BEFORE BREAKFAST   lisinopril (ZESTRIL) 40 MG tablet TAKE 1 TABLET(40 MG) BY MOUTH DAILY   mometasone (ELOCON) 0.1 % cream Apply 1 Application topically daily.   omeprazole (PRILOSEC) 20 MG capsule Take 20 mg by mouth daily. Per patient OTC   psyllium (METAMUCIL) 58.6 % powder Take 1 packet by mouth daily.   rosuvastatin (CRESTOR) 10 MG tablet Take 10 mg by mouth daily.   testosterone cypionate (DEPOTESTOSTERONE CYPIONATE) 200 MG/ML injection Inject 0.5 mLs (100 mg total) into the muscle every 7 (seven) days.   triamcinolone (KENALOG) 0.025 % cream Apply 1 application topically as needed.   No facility-administered encounter medications on file as of 07/15/2023.    Allergies (verified) Paroxetine, Morphine and codeine, Ciprofloxacin, and Flagyl [metronidazole hcl]   History: Past Medical History:  Diagnosis Date   Asthma, mild    as child-only now if has  URI   BPH (benign prostatic hypertrophy)    BPPV (benign paroxysmal positional vertigo)    Diverticulosis    Eczema    Fatty liver disease, nonalcoholic    Frequent nosebleeds    History of   GERD (gastroesophageal reflux disease)    occ uses TUMS as needed   Glaucoma    History of bronchitis    History of colon polyps    History of diverticulitis    History of kidney stones    Hyperlipemia    Hypertension    Influenza A 11/2017    Memory changes    OA (osteoarthritis)    Obesity    Overactive bladder 2020   PVCs (premature ventricular contractions)    Renal calculi right    AND LEFT X1 NON-OBSTRUCTIVE   Right sided sciatica    Bilateral buttock and thighs   Simple renal cyst right interpoler, simpler in nature , asymptomatic  per urologist note (dr dahlstedt)  12-26-2011   BENIGN, has been drained   Testosterone deficiency    Wears glasses    Wears hearing aid    Past Surgical History:  Procedure Laterality Date   APPENDECTOMY     bone spur removal     BILATERAL GREAT TOE   COLONOSCOPY     CYSTO/ RIGHT RETROGRADE PYELOGRAM/ RIGHT URETER DILATATION/ RIGHT URETEROSCOPY/ LASER LITHOTRIPSY RIGHT RENAL PELVIS AND LOWER POLE CALCULI/ PLACEMENT STENT  01-11-2012  DR DAHLSTEDT   RIGHT RENAL PELVIS AND LOWER POLE CALCULI   CYSTOSCOPY W/ RETROGRADES  06/02/2012   Procedure: CYSTOSCOPY WITH RETROGRADE PYELOGRAM;  Surgeon: Marcine Matar, MD;  Location: [facility];  Service: Urology;  Laterality: Right;   CYSTOSCOPY WITH URETEROSCOPY  06/02/2012   Procedure: CYSTOSCOPY WITH URETEROSCOPY;  Surgeon: Marcine Matar, MD;  Location: [facility];  Service: Urology;  Laterality: Right;  90 MIN ALSO EXTRACTION OF CALCULI    EYE SURGERY Bilateral 2020   LITHOTRIPSY  APPROX.  1998   ESWL  left kidney x 2   ROTATOR CUFF REPAIR  2012   LEFT SHOULDER   SHOULDER ARTHROSCOPY WITH ROTATOR CUFF REPAIR AND SUBACROMIAL DECOMPRESSION  09/12/2012   Procedure: SHOULDER ARTHROSCOPY WITH ROTATOR CUFF REPAIR AND SUBACROMIAL DECOMPRESSION;  Surgeon: Mable Paris, MD;  Location: [facility];  Service: Orthopedics;  Laterality: Right;  see above and biceps tendonotomy   SKIN LESION EXCISION Right 07/07/2021   right side of face near right ear   TRANSURETHRAL RESECTION OF BLADDER NECK N/A 04/08/2018   Procedure: TRANSURETHRAL RESECTION OF BLADDER NECK CONTRACTURE;  Surgeon:  Marcine Matar, MD;  Location: [facility];  Service: Urology;  Laterality: N/A;   TRANSURETHRAL RESECTION OF PROSTATE  11/29/2009   BPH   VASECTOMY  1970'S   GEN. ANES.   Family History  Problem Relation Age of Onset   Cancer Mother        breast and kidney    Squamous cell carcinoma Mother    Cancer Father        lung   Heart disease Father    Social History   Socioeconomic History   Marital status: Married    Spouse name: Not on file   Number of children: 3   Years of education: college   Highest education level: Bachelor's degree (e.g., BA, AB, BS)  Occupational History   Occupation: QUOTATION ENGINEER    Employer: [employer]  Tobacco Use   Smoking status: Former    Current packs/day:  0.00    Types: Cigarettes    Start date: 01/05/1959    Quit date: 01/05/1967    Years since quitting: 56.5   Smokeless tobacco: Never   Tobacco comments:    QUIT AGE 71  Vaping Use   Vaping status: Never Used  Substance and Sexual Activity   Alcohol use: Yes    Alcohol/week: 0.0 standard drinks of alcohol    Comment: OCCASIONAL   Drug use: No   Sexual activity: Yes  Other Topics Concern   Not on file  Social History Narrative   regular exercise    Full code, (Reviewed 2014)      Lives at home with his wife.   Right-handed.   Caffeine use: 4 cups per day.   Social Determinants of Health   Financial Resource Strain: Low Risk  (07/15/2023)   Overall Financial Resource Strain (CARDIA)    Difficulty of Paying Living Expenses: Not hard at all  Food Insecurity: No Food Insecurity (07/15/2023)   Hunger Vital Sign    Worried About Running Out of Food in the Last Year: Never true    Ran Out of Food in the Last Year: Never true  Transportation Needs: No Transportation Needs (07/15/2023)   PRAPARE - Administrator, Civil Service (Medical): No    Lack of Transportation (Non-Medical): No  Physical Activity: Insufficiently Active (07/15/2023)    Exercise Vital Sign    Days of Exercise per Week: 2 days    Minutes of Exercise per Session: 40 min  Stress: No Stress Concern Present (07/15/2023)   [employer] of Occupational Health - Occupational Stress Questionnaire    Feeling of Stress : Only a little  Social Connections: Unknown (07/15/2023)   Social Connection and Isolation Panel [NHANES]    Frequency of Communication with Friends and Family: Twice a week    Frequency of Social Gatherings with Friends and Family: Twice a week    Attends Religious Services: Patient unable to answer    Active Member of Clubs or Organizations: Yes    Attends Banker Meetings: 1 to 4 times per year    Marital Status: Married    Tobacco Counseling Counseling given: Not Answered Tobacco comments: QUIT AGE 71   Clinical Intake:  Pre-visit preparation completed: Yes  Pain : No/denies pain Pain Score: 0-No pain     Nutritional Risks: None Diabetes: No  How often do you need to have someone help you when you read instructions, pamphlets, or other written materials from your doctor or pharmacy?: 1 - Never What is the last grade level you completed in school?: HSG  Interpreter Needed?: No  Information entered by :: Kween Bacorn N. Drucella Karbowski, LPN.   Activities of Daily Living    07/15/2023   11:38 AM 07/13/2023    8:47 AM  In your present state of health, do you have any difficulty performing the following activities:  Hearing? 1 1  Vision? 1 1  Difficulty concentrating or making decisions? 0 0  Walking or climbing stairs? 1 1  Dressing or bathing? 0 0  Doing errands, shopping? 0 0  Preparing Food and eating ? N N  Using the Toilet? N N  In the past six months, have you accidently leaked urine? Y Y  Do you have problems with loss of bowel control? N N  Managing your Medications? N N  Managing your Finances? N N  Housekeeping or managing your Housekeeping? N N    Patient Care Team:  Excell Seltzer, MD as PCP -  General Corky Crafts, MD as PCP - Cardiology (Cardiology) Ernesto Rutherford, MD as Consulting Physician (Ophthalmology) Jesusita Oka, MD as Consulting Physician (Dermatology) Luciana Axe Alford Highland, MD as Consulting Physician (Ophthalmology) Crist Fat, MD as Attending Physician (Urology) Serena Colonel, MD as Consulting Physician (Otolaryngology)  Indicate any recent Medical Services you may have received from other than [facility] providers in the past year (date may be approximate).     Assessment:   This is a routine wellness examination for Jamario.  Hearing/Vision screen Hearing Screening - Comments:: Patient has hearing difficulty; No hearing aids. Vision Screening - Comments:: Patient does wear corrective lenses/contacts/readers.  Annual eye exam done by: [facility] Retinal Specialist: Fawn Kirk, MD.    Goals Addressed   None   Depression Screen    07/15/2023   11:25 AM 02/04/2023    9:08 AM 08/10/2022    3:16 PM 07/11/2021   12:29 PM 07/10/2020    2:53 PM 06/15/2019   12:19 PM 04/22/2018    9:48 AM  PHQ 2/9 Scores  PHQ - 2 Score 0 0 0 0 0 0 0  PHQ- 9 Score 0 2   0 0 0    Fall Risk    07/15/2023   11:28 AM 07/13/2023    8:47 AM 02/04/2023    9:08 AM 10/08/2022   12:19 PM 08/10/2022    3:16 PM  Fall Risk   Falls in the past year? 0 0 0 0 0  Number falls in past yr: 0 0  0 0  Injury with Fall? 0 0  0 0  Risk for fall due to : No Fall Risks   No Fall Risks;Impaired balance/gait Medication side effect  Follow up Falls prevention discussed   Falls evaluation completed Falls prevention discussed;Education provided;Falls evaluation completed    MEDICARE RISK AT HOME: Medicare Risk at Home Any stairs in or around the home?: Yes If so, are there any without handrails?: No Home free of loose throw rugs in walkways, pet beds, electrical cords, etc?: Yes Adequate lighting in your home to reduce risk of falls?: Yes Life alert?: No Use of a cane, walker or  w/c?: No Grab bars in the bathroom?: Yes Shower chair or bench in shower?: No Elevated toilet seat or a handicapped toilet?: No  TIMED UP AND GO:  Was the test performed?  No    Cognitive Function:    07/15/2023   11:38 AM 07/10/2020    2:56 PM 07/04/2019   11:26 AM 06/15/2019   12:23 PM 04/22/2018    9:49 AM  MMSE - Mini Mental State Exam  Not completed: Unable to complete      Orientation to time  5 5 5 5   Orientation to Place  5 5 5 5   Registration  3 3 3 3   Attention/ Calculation  5 5 5  0  Recall  3 2 3 3   Language- name 2 objects   2  0  Language- repeat  1 1 1 1   Language- follow 3 step command   3  3  Language- read & follow direction   1  0  Write a sentence   1  0  Copy design   1  0  Total score   29  20        07/15/2023   11:30 AM 08/10/2022    3:18 PM  6CIT Screen  What Year? 0 points 0 points  What month? 0 points 0 points  What time? 0 points 0 points  Count back from 20 0 points 0 points  Months in reverse 0 points 0 points  Repeat phrase 0 points 0 points  Total Score 0 points 0 points    Immunizations Immunization History  Administered Date(s) Administered   Fluad Quad(high Dose 65+) 06/16/2019, 08/02/2020, 08/08/2021, 05/05/2022   Influenza Split 06/26/2011, 07/11/2012   Influenza Whole 05/24/2008, 06/24/2008, 07/04/2009, 05/24/2010   Influenza, High Dose Seasonal PF 06/14/2018   Influenza,inj,Quad PF,6+ Mos 07/26/2013, 06/12/2014, 04/15/2015, 04/16/2016, 07/01/2017   Influenza-Unspecified 08/08/2021   PFIZER(Purple Top)SARS-COV-2 Vaccination 10/04/2019, 10/25/2019, 06/07/2020, 03/10/2021   Pneumococcal Conjugate-13 04/15/2015   Pneumococcal Polysaccharide-23 09/14/2007   Td 08/02/2007   Tdap 12/15/2021   Zoster Recombinant(Shingrix) 01/13/2022, 04/01/2022   Zoster, Live 04/08/2009    TDAP status: Up to date  Flu Vaccine status: Up to date  Pneumococcal vaccine status: Up to date  Covid-19 vaccine status: Completed  vaccines  Qualifies for Shingles Vaccine? Yes   Zostavax completed Yes   Shingrix Completed?: Yes  Screening Tests Health Maintenance  Topic Date Due   INFLUENZA VACCINE  03/25/2023   COVID-19 Vaccine (5 - 2023-24 season) 04/25/2023   Medicare Annual Wellness (AWV)  07/14/2024   DTaP/Tdap/Td (3 - Td or Tdap) 12/16/2031   Pneumonia Vaccine 45+ Years old  Completed   Zoster Vaccines- Shingrix  Completed   HPV VACCINES  Aged Out    Health Maintenance  Health Maintenance Due  Topic Date Due   INFLUENZA VACCINE  03/25/2023   COVID-19 Vaccine (5 - 2023-24 season) 04/25/2023    Colorectal cancer screening: No longer required.   Lung Cancer Screening: (Low Dose CT Chest recommended if Age 41-80 years, 20 pack-year currently smoking OR have quit w/in 15years.) does not qualify.   Lung Cancer Screening Referral: NO  Additional Screening:  Hepatitis C Screening: does not qualify.  Vision Screening: Recommended annual ophthalmology exams for early detection of glaucoma and other disorders of the eye. Is the patient up to date with their annual eye exam?  Yes  Who is the provider or what is the name of the office in which the patient attends annual eye exams? Fawn Kirk, MD (Retinal Specialist) and Ernesto Rutherford, MD. If pt is not established with a provider, would they like to be referred to a provider to establish care? No .   Dental Screening: Recommended annual dental exams for proper oral hygiene  Diabetic Foot Exam: N/A  Community Resource Referral / Chronic Care Management: CRR required this visit?  No   CCM required this visit?  No     Plan:     I have personally reviewed and noted the following in the patient's chart:   Medical and social history Use of alcohol, tobacco or illicit drugs  Current medications and supplements including opioid prescriptions. Patient is not currently taking opioid prescriptions. Functional ability and status Nutritional  status Physical activity Advanced directives List of other physicians Hospitalizations, surgeries, and ER visits in previous 12 months Vitals Screenings to include cognitive, depression, and falls Referrals and appointments  In addition, I have reviewed and discussed with patient certain preventive protocols, quality metrics, and best practice recommendations. A written personalized care plan for preventive services as well as general preventive health recommendations were provided to patient.     Mickeal Needy, LPN   29/52/[id number]   After Visit Summary: (MyChart) Due to this being a telephonic visit, the after visit summary with  patients personalized plan was offered to patient via MyChart   Nurse Notes: None

## 2023-07-29 ENCOUNTER — Encounter: Payer: Self-pay | Admitting: Family Medicine

## 2023-07-29 ENCOUNTER — Telehealth: Payer: Self-pay | Admitting: *Deleted

## 2023-07-29 DIAGNOSIS — E7800 Pure hypercholesterolemia, unspecified: Secondary | ICD-10-CM

## 2023-07-29 NOTE — Telephone Encounter (Signed)
-----   Message from Lovena Neighbours sent at 07/29/2023  1:50 PM EST ----- Regarding: Labs for Friday 12.20.24 Please put physical lab orders in future. Thank you, Denny Peon

## 2023-07-29 NOTE — Progress Notes (Signed)
Carelink Summary Report / Loop Recorder 

## 2023-07-30 ENCOUNTER — Ambulatory Visit: Payer: Medicare HMO | Admitting: Family Medicine

## 2023-07-30 ENCOUNTER — Encounter: Payer: Self-pay | Admitting: Family Medicine

## 2023-07-30 VITALS — BP 112/74 | HR 93 | Temp 98.4°F | Ht 69.0 in | Wt 231.1 lb

## 2023-07-30 DIAGNOSIS — R051 Acute cough: Secondary | ICD-10-CM

## 2023-07-30 LAB — POC COVID19 BINAXNOW: SARS Coronavirus 2 Ag: NEGATIVE

## 2023-07-30 LAB — POC INFLUENZA A&B (BINAX/QUICKVUE)
Influenza A, POC: NEGATIVE
Influenza B, POC: NEGATIVE

## 2023-07-30 MED ORDER — PREDNISONE 20 MG PO TABS: ORAL_TABLET | ORAL | 0 refills | Status: DC

## 2023-07-30 NOTE — Progress Notes (Signed)
Patient ID: Jack Norris, male    DOB: 09/14/1940, 82 y.o.   MRN: [id number]  This visit was conducted in person.  BP 112/74 (BP Location: Left Arm, Patient Position: Sitting, Cuff Size: Large)   Pulse 93   Temp 98.4 F (36.9 C) (Temporal)   Ht 5\' 9"  (1.753 m)   Wt 231 lb 2 oz (104.8 kg)   SpO2 98%   BMI 34.13 kg/m    CC:  Chief Complaint  Patient presents with   Cough    Started feeling bad 2 days ago   Wheezing    Subjective:   HPI: Jack Norris is a 82 y.o. male presenting on 07/30/2023 for Cough (Started feeling bad 2 days ago) and Wheezing   Date of onset:  4 days Initial symptoms included  feeling slight difficult breathing Symptoms progressed to 2 days ago dry cough  Some nasal congestion.   Minimal fatigue  Mild low grade temp  Has headache,  No ear pain,  no face pain.  Mild ST, post nasal drip  Some chest tightness, no wheezing   Sick contacts:  none COVID testing:   none     He has tried to treat with  ibuprofen and mucinex.   using albuterol prn. 2-3 times a day.    Hx asthma, mild intermittent  Remote smoker      Relevant past medical, surgical, family and social history reviewed and updated as indicated. Interim medical history since our last visit reviewed. Allergies and medications reviewed and updated. Outpatient Medications Prior to Visit  Medication Sig Dispense Refill   albuterol (VENTOLIN HFA) 108 (90 Base) MCG/ACT inhaler Inhale 2 puffs into the lungs every 6 (six) hours as needed for wheezing or shortness of breath. 8 g 2   amLODipine (NORVASC) 10 MG tablet TAKE 1 TABLET BY MOUTH EVERY DAY 90 tablet 3   aspirin EC 325 MG tablet Take 325 mg by mouth daily.     B-D 3CC LUER-LOK SYR 18GX1-1/2 18G X 1-1/2" 3 ML MISC USE ONCE A WEEK AS DIRECTED  2   fenofibrate micronized (LOFIBRA) 67 MG capsule TAKE 1 CAPSULE(67 MG) BY MOUTH DAILY BEFORE BREAKFAST 90 capsule 2   lisinopril (ZESTRIL) 40 MG tablet TAKE 1 TABLET(40 MG) BY MOUTH  DAILY 90 tablet 3   mometasone (ELOCON) 0.1 % cream Apply 1 Application topically daily.     omeprazole (PRILOSEC) 20 MG capsule Take 20 mg by mouth daily. Per patient OTC     psyllium (METAMUCIL) 58.6 % powder Take 1 packet by mouth daily.     rosuvastatin (CRESTOR) 10 MG tablet Take 10 mg by mouth daily.     testosterone cypionate (DEPOTESTOSTERONE CYPIONATE) 200 MG/ML injection Inject 0.5 mLs (100 mg total) into the muscle every 7 (seven) days. 10 mL 0   triamcinolone (KENALOG) 0.025 % cream Apply 1 application topically as needed.     No facility-administered medications prior to visit.     Per HPI unless specifically indicated in ROS section below Review of Systems  Constitutional:  Negative for fatigue and fever.  HENT:  Positive for congestion. Negative for ear pain.   Eyes:  Negative for pain.  Respiratory:  Positive for cough. Negative for shortness of breath.   Cardiovascular:  Negative for chest pain, palpitations and leg swelling.  Gastrointestinal:  Negative for abdominal pain.  Genitourinary:  Negative for dysuria.  Musculoskeletal:  Negative for arthralgias.  Neurological:  Positive for headaches. Negative for syncope  and light-headedness.  Psychiatric/Behavioral:  Negative for dysphoric mood.    Objective:  BP 112/74 (BP Location: Left Arm, Patient Position: Sitting, Cuff Size: Large)   Pulse 93   Temp 98.4 F (36.9 C) (Temporal)   Ht 5\' 9"  (1.753 m)   Wt 231 lb 2 oz (104.8 kg)   SpO2 98%   BMI 34.13 kg/m   Wt Readings from Last 3 Encounters:  07/30/23 231 lb 2 oz (104.8 kg)  07/15/23 230 lb (104.3 kg)  04/09/23 228 lb 6.4 oz (103.6 kg)      Physical Exam Constitutional:      General: He is not in acute distress.    Appearance: Normal appearance. He is well-developed. He is not ill-appearing or toxic-appearing.  HENT:     Head: Normocephalic and atraumatic.     Right Ear: Hearing, tympanic membrane, ear canal and external ear normal. No tenderness. No  foreign body. Tympanic membrane is not retracted or bulging.     Left Ear: Hearing, tympanic membrane, ear canal and external ear normal. No tenderness. No foreign body. Tympanic membrane is not retracted or bulging.     Nose: Nose normal. No mucosal edema or rhinorrhea.     Right Turbinates: Swollen.     Right Sinus: No maxillary sinus tenderness or frontal sinus tenderness.     Left Sinus: No maxillary sinus tenderness or frontal sinus tenderness.     Mouth/Throat:     Dentition: Normal dentition. No dental caries.     Pharynx: Uvula midline. No oropharyngeal exudate.     Tonsils: No tonsillar abscesses.  Eyes:     General: Lids are normal. Lids are everted, no foreign bodies appreciated.     Conjunctiva/sclera: Conjunctivae normal.     Pupils: Pupils are equal, round, and reactive to light.  Neck:     Thyroid: No thyroid mass or thyromegaly.     Vascular: No carotid bruit.     Trachea: Trachea and phonation normal.  Cardiovascular:     Rate and Rhythm: Normal rate and regular rhythm.     Pulses: Normal pulses.     Heart sounds: Normal heart sounds, S1 normal and S2 normal. No murmur heard.    No gallop.  Pulmonary:     Effort: Pulmonary effort is normal. No respiratory distress.     Breath sounds: Normal breath sounds. No wheezing, rhonchi or rales.  Abdominal:     General: Bowel sounds are normal.     Palpations: Abdomen is soft.     Tenderness: There is no abdominal tenderness. There is no guarding or rebound.     Hernia: No hernia is present.  Musculoskeletal:     Cervical back: Normal range of motion and neck supple.  Skin:    General: Skin is warm and dry.     Findings: No rash.  Neurological:     Mental Status: He is alert.     Deep Tendon Reflexes: Reflexes are normal and symmetric.  Psychiatric:        Speech: Speech normal.        Behavior: Behavior normal.        Judgment: Judgment normal.       Results for orders placed or performed in visit on 07/30/23   POC COVID-19   Collection Time: 07/30/23 12:51 PM  Result Value Ref Range   SARS Coronavirus 2 Ag Negative Negative  POC Influenza A&B (Binax test)   Collection Time: 07/30/23 12:51 PM  Result Value Ref Range  Influenza A, POC Negative Negative   Influenza B, POC Negative Negative    Assessment and Plan  Acute cough Assessment & Plan: Acute, most likely viral infection. Negative COVID and flu test. Associated with acute asthma exacerbation.  Can use albuterol as needed for wheeze.  Will start prednisone taper. No clear sign of bacterial infection at this point.  Return and ER precautions provided  Orders: -     POC COVID-19 BinaxNow -     POC Influenza A&B(BINAX/QUICKVUE)  Other orders -     predniSONE; 3 tabs by mouth daily x 3 days, then 2 tabs by mouth daily x 2 days then 1 tab by mouth daily x 2 days  Dispense: 15 tablet; Refill: 0    No follow-ups on file.   Kerby Nora, MD

## 2023-07-30 NOTE — Telephone Encounter (Signed)
I spoke with pt; pt said has dry cough, wheezing on and off mucinex helps; ? Fever pt taking ibuprofen for H/A pain level 2 -3. No CP or SOB.pt does not have available covid test. Pt scheduled appt with Dr Ermalene Searing 07/30/23 at 12 noon with UC & ED precautions and pt voiced understanding., sending note  to Dr Ermalene Searing.

## 2023-08-08 ENCOUNTER — Encounter: Payer: Self-pay | Admitting: Family Medicine

## 2023-08-09 ENCOUNTER — Ambulatory Visit (INDEPENDENT_AMBULATORY_CARE_PROVIDER_SITE_OTHER): Payer: Medicare HMO

## 2023-08-09 DIAGNOSIS — I4891 Unspecified atrial fibrillation: Secondary | ICD-10-CM | POA: Diagnosis not present

## 2023-08-09 LAB — CUP PACEART REMOTE DEVICE CHECK
Date Time Interrogation Session: 20241215231816
Implantable Pulse Generator Implant Date: 20240522

## 2023-08-09 NOTE — Telephone Encounter (Signed)
Pt called checking status of message sent via mychart? Call back # [phone]

## 2023-08-10 MED ORDER — AMOXICILLIN 500 MG PO CAPS: 1000.0000 mg | ORAL_CAPSULE | Freq: Two times a day (BID) | ORAL | 0 refills | Status: DC

## 2023-08-13 ENCOUNTER — Other Ambulatory Visit: Payer: Medicare HMO

## 2023-08-16 NOTE — Assessment & Plan Note (Signed)
Acute, most likely viral infection. Negative COVID and flu test. Associated with acute asthma exacerbation.  Can use albuterol as needed for wheeze.  Will start prednisone taper. No clear sign of bacterial infection at this point.  Return and ER precautions provided

## 2023-08-16 NOTE — Progress Notes (Incomplete)
Patient ID: Jack Norris, male    DOB: 1941/08/23, 82 y.o.   MRN: [id number]  This visit was conducted in person.  BP 112/74 (BP Location: Left Arm, Patient Position: Sitting, Cuff Size: Large)   Pulse 93   Temp 98.4 F (36.9 C) (Temporal)   Ht 5\' 9"  (1.753 m)   Wt 231 lb 2 oz (104.8 kg)   SpO2 98%   BMI 34.13 kg/m    CC:  Chief Complaint  Patient presents with  . Cough    Started feeling bad 2 days ago  . Wheezing    Subjective:   HPI: Jack Norris is a 82 y.o. male presenting on 07/30/2023 for Cough (Started feeling bad 2 days ago) and Wheezing   Date of onset:  4 days Initial symptoms included  feeling slight difficult breathing Symptoms progressed to 2 days ago dry cough  Some nasal congestion.   Minimal fatigue  Mild low grade temp  Has headache,  No ear pain,  no face pain.  Mild ST, post nasal drip  Some chest tightness, no wheezing   Sick contacts:  none COVID testing:   none     He has tried to treat with  ibuprofen and mucinex.   using albuterol prn. 2-3 times a day.    Hx asthma, mild intermittent  Remote smoker      Relevant past medical, surgical, family and social history reviewed and updated as indicated. Interim medical history since our last visit reviewed. Allergies and medications reviewed and updated. Outpatient Medications Prior to Visit  Medication Sig Dispense Refill  . albuterol (VENTOLIN HFA) 108 (90 Base) MCG/ACT inhaler Inhale 2 puffs into the lungs every 6 (six) hours as needed for wheezing or shortness of breath. 8 g 2  . amLODipine (NORVASC) 10 MG tablet TAKE 1 TABLET BY MOUTH EVERY DAY 90 tablet 3  . aspirin EC 325 MG tablet Take 325 mg by mouth daily.    . B-D 3CC LUER-LOK SYR 18GX1-1/2 18G X 1-1/2" 3 ML MISC USE ONCE A WEEK AS DIRECTED  2  . fenofibrate micronized (LOFIBRA) 67 MG capsule TAKE 1 CAPSULE(67 MG) BY MOUTH DAILY BEFORE BREAKFAST 90 capsule 2  . lisinopril (ZESTRIL) 40 MG tablet TAKE 1 TABLET(40 MG)  BY MOUTH DAILY 90 tablet 3  . mometasone (ELOCON) 0.1 % cream Apply 1 Application topically daily.    Marland Kitchen omeprazole (PRILOSEC) 20 MG capsule Take 20 mg by mouth daily. Per patient OTC    . psyllium (METAMUCIL) 58.6 % powder Take 1 packet by mouth daily.    . rosuvastatin (CRESTOR) 10 MG tablet Take 10 mg by mouth daily.    Marland Kitchen testosterone cypionate (DEPOTESTOSTERONE CYPIONATE) 200 MG/ML injection Inject 0.5 mLs (100 mg total) into the muscle every 7 (seven) days. 10 mL 0  . triamcinolone (KENALOG) 0.025 % cream Apply 1 application topically as needed.     No facility-administered medications prior to visit.     Per HPI unless specifically indicated in ROS section below Review of Systems  Constitutional:  Negative for fatigue and fever.  HENT:  Positive for congestion. Negative for ear pain.   Eyes:  Negative for pain.  Respiratory:  Positive for cough. Negative for shortness of breath.   Cardiovascular:  Negative for chest pain, palpitations and leg swelling.  Gastrointestinal:  Negative for abdominal pain.  Genitourinary:  Negative for dysuria.  Musculoskeletal:  Negative for arthralgias.  Neurological:  Positive for headaches. Negative for syncope  and light-headedness.  Psychiatric/Behavioral:  Negative for dysphoric mood.    Objective:  BP 112/74 (BP Location: Left Arm, Patient Position: Sitting, Cuff Size: Large)   Pulse 93   Temp 98.4 F (36.9 C) (Temporal)   Ht 5\' 9"  (1.753 m)   Wt 231 lb 2 oz (104.8 kg)   SpO2 98%   BMI 34.13 kg/m   Wt Readings from Last 3 Encounters:  07/30/23 231 lb 2 oz (104.8 kg)  07/15/23 230 lb (104.3 kg)  04/09/23 228 lb 6.4 oz (103.6 kg)      Physical Exam Constitutional:      General: He is not in acute distress.    Appearance: Normal appearance. He is well-developed. He is not ill-appearing or toxic-appearing.  HENT:     Head: Normocephalic and atraumatic.     Right Ear: Hearing, tympanic membrane, ear canal and external ear normal. No  tenderness. No foreign body. Tympanic membrane is not retracted or bulging.     Left Ear: Hearing, tympanic membrane, ear canal and external ear normal. No tenderness. No foreign body. Tympanic membrane is not retracted or bulging.     Nose: Nose normal. No mucosal edema or rhinorrhea.     Right Turbinates: Swollen.     Right Sinus: No maxillary sinus tenderness or frontal sinus tenderness.     Left Sinus: No maxillary sinus tenderness or frontal sinus tenderness.     Mouth/Throat:     Dentition: Normal dentition. No dental caries.     Pharynx: Uvula midline. No oropharyngeal exudate.     Tonsils: No tonsillar abscesses.  Eyes:     General: Lids are normal. Lids are everted, no foreign bodies appreciated.     Conjunctiva/sclera: Conjunctivae normal.     Pupils: Pupils are equal, round, and reactive to light.  Neck:     Thyroid: No thyroid mass or thyromegaly.     Vascular: No carotid bruit.     Trachea: Trachea and phonation normal.  Cardiovascular:     Rate and Rhythm: Normal rate and regular rhythm.     Pulses: Normal pulses.     Heart sounds: Normal heart sounds, S1 normal and S2 normal. No murmur heard.    No gallop.  Pulmonary:     Effort: Pulmonary effort is normal. No respiratory distress.     Breath sounds: Normal breath sounds. No wheezing, rhonchi or rales.  Abdominal:     General: Bowel sounds are normal.     Palpations: Abdomen is soft.     Tenderness: There is no abdominal tenderness. There is no guarding or rebound.     Hernia: No hernia is present.  Musculoskeletal:     Cervical back: Normal range of motion and neck supple.  Skin:    General: Skin is warm and dry.     Findings: No rash.  Neurological:     Mental Status: He is alert.     Deep Tendon Reflexes: Reflexes are normal and symmetric.  Psychiatric:        Speech: Speech normal.        Behavior: Behavior normal.        Judgment: Judgment normal.       Results for orders placed or performed in visit  on 07/30/23  POC COVID-19  Result Value Ref Range   SARS Coronavirus 2 Ag Negative Negative  POC Influenza A&B (Binax test)  Result Value Ref Range   Influenza A, POC Negative Negative   Influenza B, POC Negative Negative  Assessment and Plan  Acute cough -     POC COVID-19 BinaxNow -     POC Influenza A&B(BINAX/QUICKVUE)    No follow-ups on file.   Kerby Nora, MD

## 2023-08-20 ENCOUNTER — Encounter: Payer: Medicare HMO | Admitting: Family Medicine

## 2023-09-10 ENCOUNTER — Other Ambulatory Visit (INDEPENDENT_AMBULATORY_CARE_PROVIDER_SITE_OTHER): Payer: Medicare HMO

## 2023-09-10 DIAGNOSIS — E7800 Pure hypercholesterolemia, unspecified: Secondary | ICD-10-CM

## 2023-09-10 LAB — LIPID PANEL
Cholesterol: 152 mg/dL (ref 0–200)
HDL: 41.5 mg/dL (ref >39.00)
LDL Cholesterol: 87 mg/dL (ref 0–99)
NonHDL: 110.77
Total CHOL/HDL Ratio: 4
Triglycerides: 121 mg/dL (ref 0.0–149.0)
VLDL: 24.2 mg/dL (ref 0.0–40.0)

## 2023-09-10 LAB — COMPREHENSIVE METABOLIC PANEL
ALT: 14 U/L (ref 0–53)
AST: 15 U/L (ref 0–37)
Albumin: 4.6 g/dL (ref 3.5–5.2)
Alkaline Phosphatase: 87 U/L (ref 39–117)
BUN: 16 mg/dL (ref 6–23)
CO2: 27 meq/L (ref 19–32)
Calcium: 9.3 mg/dL (ref 8.4–10.5)
Chloride: 106 meq/L (ref 96–112)
Creatinine, Ser: 1.35 mg/dL (ref 0.40–1.50)
GFR: 48.91 mL/min — ABNORMAL LOW (ref >60.00)
Glucose, Bld: 97 mg/dL (ref 70–99)
Potassium: 4.6 meq/L (ref 3.5–5.1)
Sodium: 143 meq/L (ref 135–145)
Total Bilirubin: 0.7 mg/dL (ref 0.2–1.2)
Total Protein: 7 g/dL (ref 6.0–8.3)

## 2023-09-10 LAB — HEMOGLOBIN A1C: Hgb A1c MFr Bld: 6.1 % (ref 4.6–6.5)

## 2023-09-10 NOTE — Progress Notes (Signed)
No critical labs need to be addressed urgently. We will discuss labs in detail at upcoming office visit.   

## 2023-09-13 ENCOUNTER — Ambulatory Visit (INDEPENDENT_AMBULATORY_CARE_PROVIDER_SITE_OTHER): Payer: Medicare HMO

## 2023-09-13 DIAGNOSIS — I4891 Unspecified atrial fibrillation: Secondary | ICD-10-CM | POA: Diagnosis not present

## 2023-09-13 LAB — CUP PACEART REMOTE DEVICE CHECK
Date Time Interrogation Session: 20250119232203
Implantable Pulse Generator Implant Date: 20240522

## 2023-09-13 NOTE — Progress Notes (Signed)
Carelink Summary Report / Loop Recorder 

## 2023-09-17 ENCOUNTER — Ambulatory Visit (INDEPENDENT_AMBULATORY_CARE_PROVIDER_SITE_OTHER): Payer: Medicare HMO | Admitting: Family Medicine

## 2023-09-17 ENCOUNTER — Encounter: Payer: Self-pay | Admitting: Family Medicine

## 2023-09-17 ENCOUNTER — Other Ambulatory Visit: Payer: Self-pay | Admitting: Family Medicine

## 2023-09-17 ENCOUNTER — Emergency Department (HOSPITAL_COMMUNITY): Payer: Medicare HMO

## 2023-09-17 ENCOUNTER — Other Ambulatory Visit: Payer: Self-pay

## 2023-09-17 ENCOUNTER — Encounter (HOSPITAL_COMMUNITY): Payer: Self-pay

## 2023-09-17 ENCOUNTER — Emergency Department (HOSPITAL_COMMUNITY)
Admission: EM | Admit: 2023-09-17 | Discharge: 2023-09-17 | Disposition: A | Discharge status: Home / Self Care | Payer: Medicare HMO | Attending: Emergency Medicine | Admitting: Emergency Medicine

## 2023-09-17 VITALS — BP 110/60 | HR 89 | Temp 98.5°F | Ht 69.0 in | Wt 226.4 lb

## 2023-09-17 DIAGNOSIS — E7800 Pure hypercholesterolemia, unspecified: Secondary | ICD-10-CM | POA: Diagnosis not present

## 2023-09-17 DIAGNOSIS — Z79899 Other long term (current) drug therapy: Secondary | ICD-10-CM | POA: Diagnosis not present

## 2023-09-17 DIAGNOSIS — J45909 Unspecified asthma, uncomplicated: Secondary | ICD-10-CM | POA: Insufficient documentation

## 2023-09-17 DIAGNOSIS — R5383 Other fatigue: Secondary | ICD-10-CM | POA: Insufficient documentation

## 2023-09-17 DIAGNOSIS — D72829 Elevated white blood cell count, unspecified: Secondary | ICD-10-CM | POA: Insufficient documentation

## 2023-09-17 DIAGNOSIS — G3184 Mild cognitive impairment, so stated: Secondary | ICD-10-CM

## 2023-09-17 DIAGNOSIS — E6609 Other obesity due to excess calories: Secondary | ICD-10-CM

## 2023-09-17 DIAGNOSIS — R110 Nausea: Secondary | ICD-10-CM | POA: Diagnosis not present

## 2023-09-17 DIAGNOSIS — I6782 Cerebral ischemia: Secondary | ICD-10-CM | POA: Diagnosis not present

## 2023-09-17 DIAGNOSIS — E349 Endocrine disorder, unspecified: Secondary | ICD-10-CM | POA: Diagnosis not present

## 2023-09-17 DIAGNOSIS — R42 Dizziness and giddiness: Secondary | ICD-10-CM | POA: Diagnosis not present

## 2023-09-17 DIAGNOSIS — R1032 Left lower quadrant pain: Secondary | ICD-10-CM

## 2023-09-17 DIAGNOSIS — E66811 Obesity, class 1: Secondary | ICD-10-CM

## 2023-09-17 DIAGNOSIS — G459 Transient cerebral ischemic attack, unspecified: Secondary | ICD-10-CM | POA: Diagnosis not present

## 2023-09-17 DIAGNOSIS — Z0000 Encounter for general adult medical examination without abnormal findings: Secondary | ICD-10-CM

## 2023-09-17 DIAGNOSIS — J4520 Mild intermittent asthma, uncomplicated: Secondary | ICD-10-CM

## 2023-09-17 DIAGNOSIS — I700 Atherosclerosis of aorta: Secondary | ICD-10-CM

## 2023-09-17 DIAGNOSIS — Z6833 Body mass index (BMI) 33.0-33.9, adult: Secondary | ICD-10-CM

## 2023-09-17 DIAGNOSIS — Z7982 Long term (current) use of aspirin: Secondary | ICD-10-CM | POA: Insufficient documentation

## 2023-09-17 DIAGNOSIS — I672 Cerebral atherosclerosis: Secondary | ICD-10-CM | POA: Diagnosis not present

## 2023-09-17 DIAGNOSIS — I10 Essential (primary) hypertension: Secondary | ICD-10-CM | POA: Insufficient documentation

## 2023-09-17 DIAGNOSIS — R1111 Vomiting without nausea: Secondary | ICD-10-CM | POA: Diagnosis not present

## 2023-09-17 LAB — CBC WITH DIFFERENTIAL/PLATELET
Abs Immature Granulocytes: 0.06 10*3/uL (ref 0.00–0.07)
Basophils Absolute: 0 10*3/uL (ref 0.0–0.1)
Basophils Absolute: 0 10*3/uL (ref 0.0–0.1)
Basophils Relative: 0.4 % (ref 0.0–3.0)
Basophils Relative: 0 %
Eosinophils Absolute: 0.2 10*3/uL (ref 0.0–0.7)
Eosinophils Absolute: 0 10*3/uL (ref 0.0–0.5)
Eosinophils Relative: 2.2 % (ref 0.0–5.0)
Eosinophils Relative: 0 %
HCT: 44.9 % (ref 39.0–52.0)
HCT: 42.4 % (ref 39.0–52.0)
Hemoglobin: 15.2 g/dL (ref 13.0–17.0)
Hemoglobin: 14.5 g/dL (ref 13.0–17.0)
Immature Granulocytes: 1 %
Lymphocytes Relative: 23.4 % (ref 12.0–46.0)
Lymphocytes Relative: 8 %
Lymphs Abs: 1.7 10*3/uL (ref 0.7–4.0)
Lymphs Abs: 1 10*3/uL (ref 0.7–4.0)
MCH: 28.3 pg (ref 26.0–34.0)
MCHC: 34 g/dL (ref 30.0–36.0)
MCHC: 34.2 g/dL (ref 30.0–36.0)
MCV: 82.8 fL (ref 78.0–100.0)
MCV: 82.7 fL (ref 80.0–100.0)
Monocytes Absolute: 0.6 10*3/uL (ref 0.1–1.0)
Monocytes Absolute: 0.5 10*3/uL (ref 0.1–1.0)
Monocytes Relative: 7.8 % (ref 3.0–12.0)
Monocytes Relative: 4 %
Neutro Abs: 4.7 10*3/uL (ref 1.4–7.7)
Neutro Abs: 11.1 10*3/uL — ABNORMAL HIGH (ref 1.7–7.7)
Neutrophils Relative %: 66.2 % (ref 43.0–77.0)
Neutrophils Relative %: 87 %
Platelets: 255 10*3/uL (ref 150.0–400.0)
Platelets: 235 10*3/uL (ref 150–400)
RBC: 5.42 Mil/uL (ref 4.22–5.81)
RBC: 5.13 MIL/uL (ref 4.22–5.81)
RDW: 14.8 % (ref 11.5–15.5)
RDW: 13.4 % (ref 11.5–15.5)
WBC: 7.1 10*3/uL (ref 4.0–10.5)
WBC: 12.7 10*3/uL — ABNORMAL HIGH (ref 4.0–10.5)
nRBC: 0 % (ref 0.0–0.2)

## 2023-09-17 LAB — COMPREHENSIVE METABOLIC PANEL
ALT: 18 U/L (ref 0–44)
AST: 19 U/L (ref 15–41)
Albumin: 3.9 g/dL (ref 3.5–5.0)
Alkaline Phosphatase: 69 U/L (ref 38–126)
Anion gap: 11 (ref 5–15)
BUN: 20 mg/dL (ref 8–23)
CO2: 22 mmol/L (ref 22–32)
Calcium: 8.9 mg/dL (ref 8.9–10.3)
Chloride: 106 mmol/L (ref 98–111)
Creatinine, Ser: 1.27 mg/dL — ABNORMAL HIGH (ref 0.61–1.24)
GFR, Estimated: 56 mL/min — ABNORMAL LOW (ref >60)
Glucose, Bld: 117 mg/dL — ABNORMAL HIGH (ref 70–99)
Potassium: 4.1 mmol/L (ref 3.5–5.1)
Sodium: 139 mmol/L (ref 135–145)
Total Bilirubin: 0.8 mg/dL (ref 0.0–1.2)
Total Protein: 6.7 g/dL (ref 6.5–8.1)

## 2023-09-17 LAB — IBC + FERRITIN
Ferritin: 141.5 ng/mL (ref 22.0–322.0)
Iron: 50 ug/dL (ref 42–165)
Saturation Ratios: 14.2 % — ABNORMAL LOW (ref 20.0–50.0)
TIBC: 352.8 ug/dL (ref 250.0–450.0)
Transferrin: 252 mg/dL (ref 212.0–360.0)

## 2023-09-17 LAB — VITAMIN D 25 HYDROXY (VIT D DEFICIENCY, FRACTURES): VITD: 26.25 ng/mL — ABNORMAL LOW (ref 30.00–100.00)

## 2023-09-17 LAB — TSH: TSH: 0.83 u[IU]/mL (ref 0.35–5.50)

## 2023-09-17 LAB — VITAMIN B12: Vitamin B-12: 195 pg/mL — ABNORMAL LOW (ref 211–911)

## 2023-09-17 MED ORDER — ONDANSETRON 4 MG PO TBDP
4.0000 mg | ORAL_TABLET | Freq: Once | ORAL | Status: AC
  Administered 2023-09-17: 4 mg via ORAL
  Filled 2023-09-17: qty 1

## 2023-09-17 MED ORDER — MECLIZINE HCL 25 MG PO TABS: 25.0000 mg | ORAL_TABLET | Freq: Three times a day (TID) | ORAL | 0 refills | Status: DC | PRN

## 2023-09-17 MED ORDER — MECLIZINE HCL 25 MG PO TABS
25.0000 mg | ORAL_TABLET | Freq: Once | ORAL | Status: AC
  Administered 2023-09-17: 25 mg via ORAL
  Filled 2023-09-17: qty 1

## 2023-09-17 MED ORDER — VITAMIN D3 1.25 MG (50000 UT) PO CAPS: 1.0000 | ORAL_CAPSULE | ORAL | 0 refills | Status: DC

## 2023-09-17 NOTE — Assessment & Plan Note (Addendum)
Encouraged exercise, weight loss, healthy eating habits. He will continue working on [employer] and I encouraged him to start daily walking indoors or out depending on weather. Discussed again GLP-1 medication given patient has indication with coronary artery disease.  This would also help with weight loss... And likely multiple weight loss associated symptoms such as fatigue, deconditioning  Reassess in 3 months.

## 2023-09-17 NOTE — Assessment & Plan Note (Signed)
Chronic, patient reports remote history as a child of asthma with intermittent exercise-induced asthma in the past.  He has noted asthma flareups associated with infections. He states the majority of his symptoms in December have resolved with a prednisone taper.  Lung exam is clear to auscultation bilaterally today.  He does still note some occasional coughing fits. No true shortness of breath but feels like he has to take an extra breath occasionally Oxygen saturation in the normal range With his 30 pound weight gain in the past few years he has been less active and is likely deconditioned.  If his symptoms are not improving we can consider pulmonary function tests/return to see pulmonary for this issue as well as sleep apnea.

## 2023-09-17 NOTE — Assessment & Plan Note (Signed)
Noted incidentally on CT, on statin

## 2023-09-17 NOTE — ED Triage Notes (Signed)
Pt BIB EMS with c/o dizziness that started today. Pt has hx of vertigo. Pt reports episode has lasted about an hour. Pt tried to walk to bathroom and had a fall and did not hit his head. Pt denies LOC. Focal weakness, slurred speech, or facial droop. Pt endorses n/v.    160/80 80 96% CBG 150  20G L Hand

## 2023-09-17 NOTE — Assessment & Plan Note (Signed)
Acute now ongoing several months, only when leaning to the left.  Of note he does carry most of his weight in his abdomen and has a protuberant abdomen.  No year fluid wave. Possible muscle strain versus symptoms secondary to weight gain. Not consistent with diverticulitis.  He will work on [employer] and if not improving will consider further evaluation such as ultrasound or CT abdomen/pelvis.

## 2023-09-17 NOTE — Assessment & Plan Note (Signed)
Followed by [facility].

## 2023-09-17 NOTE — Assessment & Plan Note (Signed)
Stable per patient and last year.  Has been seen by neurology in the past.

## 2023-09-17 NOTE — Assessment & Plan Note (Signed)
Stable, chronic.  Continue current medication.   lisinopril 40 mg daily , amlodipine 5 mg daily

## 2023-09-17 NOTE — Progress Notes (Signed)
Patient ID: CRAY MONNIN, male    DOB: 08-Jul-1941, 83 y.o.   MRN: [id number]  This visit was conducted in person.  BP 110/60 (BP Location: Left Arm, Patient Position: Sitting, Cuff Size: Large)   Pulse 89   Temp 98.5 F (36.9 C) (Oral)   Ht 5\' 9"  (1.753 m)   Wt 226 lb 6 oz (102.7 kg)   SpO2 96%   BMI 33.43 kg/m    CC:  Chief Complaint  Patient presents with   Annual Exam    Part 2 ([facility] 07/15/2023)    Subjective:   HPI: JAMIEN CASANOVA is a 83 y.o. male presenting on 09/17/2023 for Annual Exam (Part 2 ([facility] 07/15/2023))  The patient presents for  complete physical and review of chronic health problems. He/She also has the following acute concerns today:  Left abdominal ache when leaning to the left, has noted off and on since 04/2023. [place] BMs. No straining, no dysuria. Started on [place] for OAB symptoms per [facility]... maybe helps  a tiny bit.  Fatigue,  sleeping 6 hours night, less restful sleep at night, waking up early, napping during the day  Has CPAP but has not been weaning.  3. Had asthma exacerbation treated with prednisone in 07/29/2024  Feeling coughing fits off and on and needing to take a deep breath now and then.  Hx of sever asthma as a child...  Former smoker. 4. Itchy skin, eczema per derm.8 The patient saw a LPN or RN for medicare wellness visit. 07/15/23.. improved   Prevention and wellness was reviewed in detail. Note reviewed and important notes copied below.   Wt Readings from Last 3 Encounters:  09/17/23 226 lb 6 oz (102.7 kg)  07/30/23 231 lb 2 oz (104.8 kg)  07/15/23 230 lb (104.3 kg)    Hypertension: Good control on lisinopril 40 mg daily, amlodipine 10 mg daily BP Readings from Last 3 Encounters:  09/17/23 110/60  07/30/23 112/74  04/09/23 128/70  Using medication without problems or lightheadedness:  none Chest pain with exertion:none Edema: mild Short of breath:none Average home BPs: Other issues:  Elevated Cholesterol: LDL  almost at goal less than 70 given coronary artery disease on rosuvastatin 5 mg p.o. daily and fenofibrate 67 mg daily.   He feels trend up may be due to weight gain of 30 Lbs in last 2 years... has restarted weight watchers, has been eating poorly over last 3 months. Lab Results  Component Value Date   CHOL 152 09/10/2023   HDL 41.50 09/10/2023   LDLCALC 87 09/10/2023   LDLDIRECT 107.6 12/14/2011   TRIG 121.0 09/10/2023   CHOLHDL 4 09/10/2023  Using medications without problems: Muscle aches:  Diet compliance: moderate Exercise:  none Other complaints: Hx of CAD     Mild cognitive impairment: followed by  Dr. Terrace Arabia in past. MRI of the brain  05/2022: IMPRESSION: 1. No retrocochlear lesion identified. 2. Mild chronic microvascular ischemic changes of the white matter.     Laboratory showed normal TSH, B12, no treatable etiology identified Recommended exercise no other treatment at this time.   He reports that it is stable in last year.  Relevant past medical, surgical, family and social history reviewed and updated as indicated. Interim medical history since our last visit reviewed. Allergies and medications reviewed and updated. Outpatient Medications Prior to Visit  Medication Sig Dispense Refill   albuterol (VENTOLIN HFA) 108 (90 Base) MCG/ACT inhaler Inhale 2 puffs into the lungs every 6 (  six) hours as needed for wheezing or shortness of breath. 8 g 2   amLODipine (NORVASC) 10 MG tablet TAKE 1 TABLET BY MOUTH EVERY DAY 90 tablet 3   aspirin EC 325 MG tablet Take 325 mg by mouth daily.     B-D 3CC LUER-LOK SYR 18GX1-1/2 18G X 1-1/2" 3 ML MISC USE ONCE A WEEK AS DIRECTED  2   fenofibrate micronized (LOFIBRA) 67 MG capsule TAKE 1 CAPSULE(67 MG) BY MOUTH DAILY BEFORE BREAKFAST 90 capsule 2   lisinopril (ZESTRIL) 40 MG tablet TAKE 1 TABLET(40 MG) BY MOUTH DAILY 90 tablet 3   mometasone (ELOCON) 0.1 % cream Apply 1 Application topically daily.     omeprazole (PRILOSEC) 20 MG capsule  Take 20 mg by mouth daily. Per patient OTC     psyllium (METAMUCIL) 58.6 % powder Take 1 packet by mouth daily.     rosuvastatin (CRESTOR) 10 MG tablet Take 10 mg by mouth daily.     testosterone cypionate (DEPOTESTOSTERONE CYPIONATE) 200 MG/ML injection Inject 0.5 mLs (100 mg total) into the muscle every 7 (seven) days. 10 mL 0   triamcinolone (KENALOG) 0.025 % cream Apply 1 application topically as needed.     amoxicillin (AMOXIL) 500 MG capsule Take 2 capsules (1,000 mg total) by mouth 2 (two) times daily. 40 capsule 0   predniSONE (DELTASONE) 20 MG tablet 3 tabs by mouth daily x 3 days, then 2 tabs by mouth daily x 2 days then 1 tab by mouth daily x 2 days 15 tablet 0   No facility-administered medications prior to visit.     Per HPI unless specifically indicated in ROS section below Review of Systems  Constitutional:  Negative for fatigue and fever.  HENT:  Negative for ear pain.   Eyes:  Negative for pain.  Respiratory:  Negative for cough and shortness of breath.   Cardiovascular:  Negative for chest pain, palpitations and leg swelling.  Gastrointestinal:  Negative for abdominal pain.  Genitourinary:  Negative for dysuria.  Musculoskeletal:  Negative for arthralgias.  Neurological:  Negative for syncope, light-headedness and headaches.  Psychiatric/Behavioral:  Negative for dysphoric mood.    Objective:  BP 110/60 (BP Location: Left Arm, Patient Position: Sitting, Cuff Size: Large)   Pulse 89   Temp 98.5 F (36.9 C) (Oral)   Ht 5\' 9"  (1.753 m)   Wt 226 lb 6 oz (102.7 kg)   SpO2 96%   BMI 33.43 kg/m   Wt Readings from Last 3 Encounters:  09/17/23 226 lb 6 oz (102.7 kg)  07/30/23 231 lb 2 oz (104.8 kg)  07/15/23 230 lb (104.3 kg)      Physical Exam Constitutional:      General: He is not in acute distress.    Appearance: Normal appearance. He is well-developed. He is obese. He is not ill-appearing or toxic-appearing.  HENT:     Head: Normocephalic and atraumatic.      Right Ear: Hearing, tympanic membrane, ear canal and external ear normal.     Left Ear: Hearing, tympanic membrane, ear canal and external ear normal.     Nose: Nose normal.     Mouth/Throat:     Pharynx: Uvula midline.  Eyes:     General: Lids are normal. Lids are everted, no foreign bodies appreciated.     Conjunctiva/sclera: Conjunctivae normal.     Pupils: Pupils are equal, round, and reactive to light.  Neck:     Thyroid: No thyroid mass or thyromegaly.  Vascular: No carotid bruit.     Trachea: Trachea and phonation normal.  Cardiovascular:     Rate and Rhythm: Normal rate and regular rhythm.     Pulses: Normal pulses.     Heart sounds: S1 normal and S2 normal. No murmur heard.    No gallop.  Pulmonary:     Breath sounds: Normal breath sounds. No wheezing, rhonchi or rales.  Abdominal:     General: Bowel sounds are normal.     Palpations: Abdomen is soft.     Tenderness: There is no abdominal tenderness. There is no guarding or rebound.     Hernia: No hernia is present.  Musculoskeletal:     Cervical back: Normal range of motion and neck supple.  Lymphadenopathy:     Cervical: No cervical adenopathy.  Skin:    General: Skin is warm and dry.     Findings: No rash.  Neurological:     Mental Status: He is alert.     Cranial Nerves: No cranial nerve deficit.     Sensory: No sensory deficit.     Gait: Gait normal.     Deep Tendon Reflexes: Reflexes are normal and symmetric.  Psychiatric:        Speech: Speech normal.        Behavior: Behavior normal.        Judgment: Judgment normal.       Results for orders placed or performed in visit on 09/13/23  CUP PACEART REMOTE DEVICE CHECK   Collection Time: 09/12/23 11:22 PM  Result Value Ref Range   Date Time Interrogation Session [id number]    Pulse Generator Manufacturer MERM    Pulse Gen Model LNQ22 LINQ II    Pulse Gen Serial Number [id number]    Clinic [facility]    Implantable Pulse Generator Type  ICM/ILR    Implantable Pulse Generator Implant Date [id number]      COVID 19 screen:  No recent travel or known exposure to COVID19 The patient denies respiratory symptoms of COVID 19 at this time. The importance of social distancing was discussed today.   Assessment and Plan   The patient's preventative maintenance and recommended screening tests for an annual wellness exam were reviewed in full today. Brought up to date unless services declined.  Counselled on the importance of diet, exercise, and its role in overall health and mortality. The patient's FH and SH was reviewed, including their home life, tobacco status, and drug and alcohol status.   Vaccines: Uptodate with Tdap flu, COVID x 3 , PNA, shingrix Colon: 05/2013, [place] nml, no further indicated.   Prostate: per uro. Stable PSA per  Dr.  Cristobal Goldmann. Former smoker, remotely.    Hep C  done.  Problem List Items Addressed This Visit     Abdominal pain, left lower quadrant   Acute now ongoing several months, only when leaning to the left.  Of note he does carry most of his weight in his abdomen and has a protuberant abdomen.  No year fluid wave. Possible muscle strain versus symptoms secondary to weight gain. Not consistent with diverticulitis.  He will work on [employer] and if not improving will consider further evaluation such as ultrasound or CT abdomen/pelvis.      Aortic atherosclerosis (HCC)   Noted incidentally on CT, on statin      Benign essential hypertension (Chronic)   Stable, chronic.  Continue current medication.   lisinopril 40 mg daily , amlodipine 5 mg daily  Class 1 obesity due to excess calories with serious comorbidity and body mass index (BMI) of 33.0 to 33.9 in adult   Encouraged exercise, weight loss, healthy eating habits. He will continue working on [employer] and I encouraged him to start daily walking indoors or out depending on weather. Discussed again GLP-1 medication given  patient has indication with coronary artery disease.  This would also help with weight loss... And likely multiple weight loss associated symptoms such as fatigue, deconditioning  Reassess in 3 months.      Extrinsic asthma   Chronic, patient reports remote history as a child of asthma with intermittent exercise-induced asthma in the past.  He has noted asthma flareups associated with infections. He states the majority of his symptoms in December have resolved with a prednisone taper.  Lung exam is clear to auscultation bilaterally today.  He does still note some occasional coughing fits. No true shortness of breath but feels like he has to take an extra breath occasionally Oxygen saturation in the normal range With his 30 pound weight gain in the past few years he has been less active and is likely deconditioned.  If his symptoms are not improving we can consider pulmonary function tests/return to see pulmonary for this issue as well as sleep apnea.      HYPERCHOLESTEROLEMIA (Chronic)   Stable, chronic.  Continue current medication.      At goal LDL < 70 on rosuvastatin and fenofibrate      Mild cognitive impairment with memory loss    Stable per patient and last year.  Has been seen by neurology in the past.      Other fatigue   Acute worsening of chronic issue Will evaluate with labs.  May be secondary to deconditioning in part given inactivity and weight gain.      Relevant Orders   CBC with Differential/Platelet   IBC + Ferritin   Vitamin B12   TSH   VITAMIN D 25 Hydroxy (Vit-D Deficiency, Fractures)   Testosterone deficiency (Chronic)   Followed by [facility]      Other Visit Diagnoses       Routine general medical examination at a health care facility    -  Primary         Kerby Nora, MD

## 2023-09-17 NOTE — ED Provider Notes (Signed)
[place] EMERGENCY DEPARTMENT AT [facility] Provider Note   CSN: [id number] Arrival date & time: 09/17/23  1914     History  Chief Complaint  Patient presents with   Dizziness    Jack Norris is a 83 y.o. male.  83 year old male presents today for concern of dizziness.  He states he did see his PCP but the episode occurred after.  He states he was sitting scrolling on his phone when all of a sudden dizziness overtook him.  He states that it has largely resolved by now.  He is still slightly nauseous but the dizziness has resolved.  No headache.  He states he occasionally gets a headache about once or twice a month.  He states this felt similar but it was slightly worse.  Denies other complaints.  The history is provided by the patient. No language interpreter was used.       Home Medications Prior to Admission medications   Medication Sig Start Date End Date Taking? Authorizing Provider  albuterol (VENTOLIN HFA) 108 (90 Base) MCG/ACT inhaler Inhale 2 puffs into the lungs every 6 (six) hours as needed for wheezing or shortness of breath. 09/28/22   Copland, Karleen Hampshire, MD  amLODipine (NORVASC) 10 MG tablet TAKE 1 TABLET BY MOUTH EVERY DAY 04/30/23   Corky Crafts, MD  aspirin EC 325 MG tablet Take 325 mg by mouth daily.    [provider]  B-D 3CC LUER-LOK SYR 18GX1-1/2 18G X 1-1/2" 3 ML MISC USE ONCE A WEEK AS DIRECTED 12/19/17   [provider]  Cholecalciferol (VITAMIN D3) 1.25 MG (50000 UT) CAPS Take 1 capsule (1.25 mg total) by mouth once a week. 09/17/23   Bedsole, Amy E, MD  fenofibrate micronized (LOFIBRA) 67 MG capsule TAKE 1 CAPSULE(67 MG) BY MOUTH DAILY BEFORE BREAKFAST 06/29/23   Dyann Kief, PA-C  lisinopril (ZESTRIL) 40 MG tablet TAKE 1 TABLET(40 MG) BY MOUTH DAILY 05/28/23   Corky Crafts, MD  mometasone (ELOCON) 0.1 % cream Apply 1 Application topically daily. 12/15/22   [provider]  omeprazole (PRILOSEC) 20 MG  capsule Take 20 mg by mouth daily. Per patient OTC    [provider]  psyllium (METAMUCIL) 58.6 % powder Take 1 packet by mouth daily.    [provider]  rosuvastatin (CRESTOR) 10 MG tablet Take 10 mg by mouth daily. 10/20/22   [provider]  testosterone cypionate (DEPOTESTOSTERONE CYPIONATE) 200 MG/ML injection Inject 0.5 mLs (100 mg total) into the muscle every 7 (seven) days. 08/24/16   Bedsole, Amy E, MD  triamcinolone (KENALOG) 0.025 % cream Apply 1 application topically as needed.    [provider]      Allergies    Paroxetine, Morphine and codeine, Ciprofloxacin, and Flagyl [metronidazole hcl]    Review of Systems   Review of Systems  Constitutional:  Negative for chills and fever.  Respiratory:  Negative for shortness of breath.   Gastrointestinal:  Positive for nausea. Negative for abdominal pain and vomiting.  Neurological:  Positive for dizziness. Negative for light-headedness.  All other systems reviewed and are negative.   Physical Exam Updated Vital Signs BP 123/69 (BP Location: Right Arm)   Pulse 79   Temp (!) 97.3 F (36.3 C) (Oral)   Resp 15   Wt 102.5 kg   SpO2 98%   BMI 33.37 kg/m  Physical Exam Vitals and nursing note reviewed.  Constitutional:      General: He is not  in acute distress.    Appearance: Normal appearance. He is not ill-appearing.  HENT:     Head: Normocephalic and atraumatic.     Nose: Nose normal.  Eyes:     Extraocular Movements: Extraocular movements intact.     Conjunctiva/sclera: Conjunctivae normal.     Pupils: Pupils are equal, round, and reactive to light.  Cardiovascular:     Rate and Rhythm: Normal rate and regular rhythm.  Pulmonary:     Effort: Pulmonary effort is normal. No respiratory distress.  Musculoskeletal:        General: No deformity. Normal range of motion.     Cervical back: Normal range of motion.  Skin:    Findings: No rash.  Neurological:     General: No focal  deficit present.     Mental Status: He is alert and oriented to person, place, and time. Mental status is at baseline.     Cranial Nerves: No cranial nerve deficit.     Motor: No weakness.     ED Results / Procedures / Treatments   Labs (all labs ordered are listed, but only abnormal results are displayed) Labs Reviewed  CBC WITH DIFFERENTIAL/PLATELET  COMPREHENSIVE METABOLIC PANEL    EKG None  Radiology No results found.  Procedures Procedures    Medications Ordered in ED Medications  meclizine (ANTIVERT) tablet 25 mg (has no administration in time range)  ondansetron (ZOFRAN-ODT) disintegrating tablet 4 mg (has no administration in time range)    ED Course/ Medical Decision Making/ A&P                                 Medical Decision Making Amount and/or Complexity of Data Reviewed Labs: ordered. Radiology: ordered.  Risk Prescription drug management.   Medical Decision Making / ED Course   This patient presents to the ED for concern of dizziness, this involves an extensive number of treatment options, and is a complaint that carries with it a high risk of complications and morbidity.  The differential diagnosis includes vertigo, Mnire's disease, CVA, TIA, orthostasis  MDM: 83 year old male presents today for concern of dizziness.  This was sudden onset of dizziness with the room around him spinning.  He states he has history of similar episodes that occur about once or twice a month.Currently asymptomatic.  I did obtain orthostatic vital signs at bedside.  These were negative.   Admission considered however patient will be reevaluated after labs and imaging.  Will obtain labs, and CT head.  Will give meclizine.  CBC with mild leukocytosis.  No anemia.  CMP without acute concern.  CT head without acute intracranial finding.  EKG without acute ischemic change.  Will give patient a referral to neurology.  Patient is otherwise stable for discharge.  Low  suspicion for CVA.  No focal neurological deficits.  He has remained symptom-free in the emergency department.  Unlikely to be CVA given that he has these episodes intermittently now for months and maybe up to a year.   Lab Tests: -I ordered, reviewed, and interpreted labs.   The pertinent results include:   Labs Reviewed  CBC WITH DIFFERENTIAL/PLATELET - Abnormal; Notable for the following components:      Result Value   WBC 12.7 (*)    Neutro Abs 11.1 (*)    All other components within normal limits  COMPREHENSIVE METABOLIC PANEL - Abnormal; Notable for the following components:   Glucose, Bld  117 (*)    Creatinine, Ser 1.27 (*)    GFR, Estimated 56 (*)    All other components within normal limits      EKG  EKG Interpretation Date/Time:  Friday September 17 2023 19:23:40 EST Ventricular Rate:  77 PR Interval:  156 QRS Duration:  120 QT Interval:  414 QTC Calculation: 469 R Axis:   -55  Text Interpretation: Sinus rhythm Left anterior fascicular block No significant change since last tracing Confirmed by Jacalyn Lefevre [phone]) on 09/17/2023 8:39:00 PM         Imaging Studies ordered: I ordered imaging studies including CT head I independently visualized and interpreted imaging. I agree with the radiologist interpretation   Medicines ordered and prescription drug management: Meds ordered this encounter  Medications   meclizine (ANTIVERT) tablet 25 mg   ondansetron (ZOFRAN-ODT) disintegrating tablet 4 mg    -I have reviewed the patients home medicines and have made adjustments as needed   Reevaluation: After the interventions noted above, I reevaluated the patient and found that they have :resolved  Co morbidities that complicate the patient evaluation  Past Medical History:  Diagnosis Date   Asthma, mild    as child-only now if has URI   BPH (benign prostatic hypertrophy)    BPPV (benign paroxysmal positional vertigo)    Diverticulosis    Eczema    Fatty  liver disease, nonalcoholic    Frequent nosebleeds    History of   GERD (gastroesophageal reflux disease)    occ uses TUMS as needed   Glaucoma    History of bronchitis    History of colon polyps    History of diverticulitis    History of kidney stones    Hyperlipemia    Hypertension    Influenza A 11/2017   Memory changes    OA (osteoarthritis)    Obesity    Overactive bladder 2020   PVCs (premature ventricular contractions)    Renal calculi right    AND LEFT X1 NON-OBSTRUCTIVE   Right sided sciatica    Bilateral buttock and thighs   Simple renal cyst right interpoler, simpler in nature , asymptomatic  per urologist note (dr dahlstedt)  12-26-2011   BENIGN, has been drained   Testosterone deficiency    Wears glasses    Wears hearing aid       Dispostion: Discharged in stable condition.  Return precaution discussed.  Patient voices understanding and is in agreement with plan.   Final Clinical Impression(s) / ED Diagnoses Final diagnoses:  Dizziness    Rx / DC Orders ED Discharge Orders          Ordered    Ambulatory referral to Neurology       Comments: An appointment is requested in approximately: 1 week   09/17/23 2207    meclizine (ANTIVERT) 25 MG tablet  3 times daily PRN        09/17/23 2211              Marita [place], PA-C 09/17/23 2211    Jacalyn Lefevre, MD 09/17/23 2300

## 2023-09-17 NOTE — Patient Instructions (Addendum)
   Please stop at the lab to have labs drawn. Keep working on weight loss, low cholesterol diet.  Work on daily exercise.  Keep up with water intake.  Avoid any ibuprofen, aleve.  Consider [phone] for cardiac prevention as well as weight loss assistance.

## 2023-09-17 NOTE — Discharge Instructions (Signed)
Your workup today was reassuring.  CT scan did not show any concerning findings.  Blood work was reassuring.  Follow-up with your primary care doctor.  Have also given a referral to neurology.  I have sent meclizine into the pharmacy for you.  For any emergent symptoms return to the emergency room.

## 2023-09-17 NOTE — Assessment & Plan Note (Signed)
Acute worsening of chronic issue Will evaluate with labs.  May be secondary to deconditioning in part given inactivity and weight gain.

## 2023-09-17 NOTE — Assessment & Plan Note (Signed)
Stable, chronic.  Continue current medication.    At goal LDL < 70 on rosuvastatin and fenofibrate

## 2023-09-18 ENCOUNTER — Encounter: Payer: Self-pay | Admitting: Family Medicine

## 2023-10-01 ENCOUNTER — Ambulatory Visit (INDEPENDENT_AMBULATORY_CARE_PROVIDER_SITE_OTHER): Payer: Medicare HMO | Admitting: Family Medicine

## 2023-10-01 ENCOUNTER — Encounter: Payer: Self-pay | Admitting: Family Medicine

## 2023-10-01 VITALS — BP 120/68 | HR 96 | Temp 98.1°F | Ht 69.0 in | Wt 226.2 lb

## 2023-10-01 DIAGNOSIS — R42 Dizziness and giddiness: Secondary | ICD-10-CM | POA: Diagnosis not present

## 2023-10-01 NOTE — Patient Instructions (Signed)
 Referred  to vestibular rehab.

## 2023-10-01 NOTE — Progress Notes (Signed)
 Patient ID: Jack Norris, male    DOB: Sep 15, 1940, 83 y.o.   MRN: [id number]  This visit was conducted in person.  BP 120/68 (BP Location: Left Arm, Patient Position: Sitting, Cuff Size: Large)   Pulse 96   Temp 98.1 F (36.7 C) (Temporal)   Ht 5' 9 (1.753 m)   Wt 226 lb 4 oz (102.6 kg)   SpO2 97%   BMI 33.41 kg/m    CC:  Chief Complaint  Patient presents with   Dizziness    Subjective:   HPI: Jack Norris is a 83 y.o. male presenting on 10/01/2023 for Dizziness  Recent ED visit on September 17, 2023 with dizziness. Was evaluated with EKG: Head CT: No acute intracranial process, chronic atrophy Complete metabolic panel: Normal CBC: White blood cells 12.7, normal hemoglobin and platelets Symptoms felt to be consistent with vertigo.  Negative orthostatic vitals.  Referred to neurology... appt is 11/2023 Felt unlikely symptoms were due to CVA. Treated with meclizine   Patient reports continued vertigo, room spinning with movement of head. Continue to have intermittent spells... meclizine   Associated with nausea and emesis, dry heaves    Has appt in a few weeks with ENT.    Has been replacing vit D and B12.   5 spells in last 5 weeks. In the past he had these spells once or twice dA YEAR.    2020 MRI brain, IAC: no finding  2023 IMPRESSION: 1. No retrocochlear lesion identified. 2. Mild chronic microvascular ischemic changes of the white matter.    Relevant past medical, surgical, family and social history reviewed and updated as indicated. Interim medical history since our last visit reviewed. Allergies and medications reviewed and updated. Outpatient Medications Prior to Visit  Medication Sig Dispense Refill   albuterol  (VENTOLIN  HFA) 108 (90 Base) MCG/ACT inhaler Inhale 2 puffs into the lungs every 6 (six) hours as needed for wheezing or shortness of breath. 8 g 2   amLODipine  (NORVASC ) 10 MG tablet TAKE 1 TABLET BY MOUTH EVERY DAY 90 tablet 3    aspirin EC 325 MG tablet Take 325 mg by mouth daily.     B-D 3CC LUER-LOK SYR 18GX1-1/2 18G X 1-1/2 3 ML MISC USE ONCE A WEEK AS DIRECTED  2   Cholecalciferol (VITAMIN D3) 1.25 MG (50000 UT) CAPS Take 1 capsule (1.25 mg total) by mouth once a week. 12 capsule 0   fenofibrate  micronized (LOFIBRA) 67 MG capsule TAKE 1 CAPSULE(67 MG) BY MOUTH DAILY BEFORE BREAKFAST 90 capsule 2   lisinopril  (ZESTRIL ) 40 MG tablet TAKE 1 TABLET(40 MG) BY MOUTH DAILY 90 tablet 3   meclizine  (ANTIVERT ) 25 MG tablet Take 1 tablet (25 mg total) by mouth 3 (three) times daily as needed for dizziness. 30 tablet 0   mometasone (ELOCON) 0.1 % cream Apply 1 Application topically daily.     omeprazole (PRILOSEC) 20 MG capsule Take 20 mg by mouth daily. Per patient OTC     psyllium (METAMUCIL) 58.6 % powder Take 1 packet by mouth daily.     rosuvastatin  (CRESTOR ) 10 MG tablet Take 10 mg by mouth daily.     testosterone  cypionate (DEPOTESTOSTERONE CYPIONATE) 200 MG/ML injection Inject 0.5 mLs (100 mg total) into the muscle every 7 (seven) days. 10 mL 0   triamcinolone  (KENALOG ) 0.025 % cream Apply 1 application topically as needed.     No facility-administered medications prior to visit.     Per HPI unless specifically indicated in ROS section  below Review of Systems  Constitutional:  Negative for fatigue and fever.  HENT:  Negative for ear pain.   Eyes:  Negative for pain.  Respiratory:  Negative for cough and shortness of breath.   Cardiovascular:  Negative for chest pain, palpitations and leg swelling.  Gastrointestinal:  Negative for abdominal pain.  Genitourinary:  Negative for dysuria.  Musculoskeletal:  Negative for arthralgias.  Neurological:  Positive for dizziness. Negative for syncope, light-headedness and headaches.  Psychiatric/Behavioral:  Negative for dysphoric mood.    Objective:  BP 120/68 (BP Location: Left Arm, Patient Position: Sitting, Cuff Size: Large)   Pulse 96   Temp 98.1 F (36.7 C)  (Temporal)   Ht 5' 9 (1.753 m)   Wt 226 lb 4 oz (102.6 kg)   SpO2 97%   BMI 33.41 kg/m   Wt Readings from Last 3 Encounters:  10/01/23 226 lb 4 oz (102.6 kg)  09/17/23 226 lb (102.5 kg)  09/17/23 226 lb 6 oz (102.7 kg)      Physical Exam Constitutional:      Appearance: He is well-developed.  HENT:     Head: Normocephalic.     Right Ear: Hearing normal.     Left Ear: Hearing normal.     Nose: Nose normal.  Neck:     Thyroid : No thyroid  mass or thyromegaly.     Vascular: No carotid bruit.     Trachea: Trachea normal.  Cardiovascular:     Rate and Rhythm: Normal rate and regular rhythm.     Pulses: Normal pulses.     Heart sounds: Heart sounds not distant. No murmur heard.    No friction rub. No gallop.     Comments: No peripheral edema Pulmonary:     Effort: Pulmonary effort is normal. No respiratory distress.     Breath sounds: Normal breath sounds.  Skin:    General: Skin is warm and dry.     Findings: No rash.  Neurological:     General: No focal deficit present.     Mental Status: He is oriented to person, place, and time.     Cranial Nerves: Cranial nerves 2-12 are intact.     Sensory: Sensation is intact.     Motor: Motor function is intact.     Coordination: Coordination is intact.     Gait: Gait is intact.  Psychiatric:        Speech: Speech normal.        Behavior: Behavior normal.        Thought Content: Thought content normal.       Results for orders placed or performed during the hospital encounter of 09/17/23  CBC with Differential   Collection Time: 09/17/23  8:56 PM  Result Value Ref Range   WBC 12.7 (H) 4.0 - 10.5 K/uL   RBC 5.13 4.22 - 5.81 MIL/uL   Hemoglobin 14.5 13.0 - 17.0 g/dL   HCT 57.5 60.9 - 47.9 %   MCV 82.7 80.0 - 100.0 fL   MCH 28.3 26.0 - 34.0 pg   MCHC 34.2 30.0 - 36.0 g/dL   RDW 86.5 88.4 - 84.4 %   Platelets 235 150 - 400 K/uL   nRBC 0.0 0.0 - 0.2 %   Neutrophils Relative % 87 %   Neutro Abs 11.1 (H) 1.7 - 7.7 K/uL    Lymphocytes Relative 8 %   Lymphs Abs 1.0 0.7 - 4.0 K/uL   Monocytes Relative 4 %   Monocytes Absolute 0.5 0.1 -  1.0 K/uL   Eosinophils Relative 0 %   Eosinophils Absolute 0.0 0.0 - 0.5 K/uL   Basophils Relative 0 %   Basophils Absolute 0.0 0.0 - 0.1 K/uL   Immature Granulocytes 1 %   Abs Immature Granulocytes 0.06 0.00 - 0.07 K/uL  Comprehensive metabolic panel   Collection Time: 09/17/23  8:56 PM  Result Value Ref Range   Sodium 139 135 - 145 mmol/L   Potassium 4.1 3.5 - 5.1 mmol/L   Chloride 106 98 - 111 mmol/L   CO2 22 22 - 32 mmol/L   Glucose, Bld 117 (H) 70 - 99 mg/dL   BUN 20 8 - 23 mg/dL   Creatinine, Ser 8.72 (H) 0.61 - 1.24 mg/dL   Calcium  8.9 8.9 - 10.3 mg/dL   Total Protein 6.7 6.5 - 8.1 g/dL   Albumin 3.9 3.5 - 5.0 g/dL   AST 19 15 - 41 U/L   ALT 18 0 - 44 U/L   Alkaline Phosphatase 69 38 - 126 U/L   Total Bilirubin 0.8 0.0 - 1.2 mg/dL   GFR, Estimated 56 (L) >60 mL/min   Anion gap 11 5 - 15    Assessment and Plan  Vertigo Assessment & Plan: Chronic, intermittent Unremarkable workup in the ER with normal EKG, unremarkable head CT, normal metabolic panel and blood count. Normal orthostatic vitals.  He continues to use meclizine  as needed. Discussed given recurrent symptoms he would likely benefit from vestibular rehab, PT referral placed.  He also has upcoming follow-up with ENT for further evaluation and treatment. He also does have a referral in process for neurology.  Of note in 2023 he had an MRI brain that showed mild chronic microvascular ischemic changes no retrocochlear lesion identified.  Orders: -     Ambulatory referral to Physical Therapy    No follow-ups on file.   Greig Ring, MD

## 2023-10-05 ENCOUNTER — Telehealth: Payer: Self-pay | Admitting: Neurology

## 2023-10-05 NOTE — Telephone Encounter (Signed)
Appointment details confirmed

## 2023-10-07 ENCOUNTER — Encounter: Payer: Self-pay | Admitting: Family Medicine

## 2023-10-11 ENCOUNTER — Ambulatory Visit: Payer: Medicare HMO | Attending: Family Medicine | Admitting: Physical Therapy

## 2023-10-11 ENCOUNTER — Other Ambulatory Visit: Payer: Self-pay

## 2023-10-11 DIAGNOSIS — R29818 Other symptoms and signs involving the nervous system: Secondary | ICD-10-CM | POA: Insufficient documentation

## 2023-10-11 DIAGNOSIS — R42 Dizziness and giddiness: Secondary | ICD-10-CM | POA: Diagnosis not present

## 2023-10-11 DIAGNOSIS — R279 Unspecified lack of coordination: Secondary | ICD-10-CM | POA: Insufficient documentation

## 2023-10-11 DIAGNOSIS — R2681 Unsteadiness on feet: Secondary | ICD-10-CM | POA: Diagnosis not present

## 2023-10-11 NOTE — Therapy (Signed)
 OUTPATIENT PHYSICAL THERAPY VESTIBULAR EVALUATION  Patient Name: Jack Norris MRN: [id number] DOB:November 10, 1940, 83 y.o., male Today's Date: 10/11/2023  END OF SESSION:  PT End of Session - 10/11/23 0921     Visit Number 1    Number of Visits 12    Date for PT Re-Evaluation 11/22/23    Authorization Type [employer]    Progress Note Due on Visit 10    PT Start Time 0925    PT Stop Time 1005    PT Time Calculation (min) 40 min             Past Medical History:  Diagnosis Date   Asthma, mild    as child-only now if has URI   BPH (benign prostatic hypertrophy)    BPPV (benign paroxysmal positional vertigo)    Diverticulosis    Eczema    Fatty liver disease, nonalcoholic    Frequent nosebleeds    History of   GERD (gastroesophageal reflux disease)    occ uses TUMS as needed   Glaucoma    History of bronchitis    History of colon polyps    History of diverticulitis    History of kidney stones    Hyperlipemia    Hypertension    Influenza A 11/2017   Memory changes    OA (osteoarthritis)    Obesity    Overactive bladder 2020   PVCs (premature ventricular contractions)    Renal calculi right    AND LEFT X1 NON-OBSTRUCTIVE   Right sided sciatica    Bilateral buttock and thighs   Simple renal cyst right interpoler, simpler in nature , asymptomatic  per urologist note (dr dahlstedt)  12-26-2011   BENIGN, has been drained   Testosterone deficiency    Wears glasses    Wears hearing aid    Past Surgical History:  Procedure Laterality Date   APPENDECTOMY     bone spur removal     BILATERAL GREAT TOE   COLONOSCOPY     CYSTO/ RIGHT RETROGRADE PYELOGRAM/ RIGHT URETER DILATATION/ RIGHT URETEROSCOPY/ LASER LITHOTRIPSY RIGHT RENAL PELVIS AND LOWER POLE CALCULI/ PLACEMENT STENT  01-11-2012  DR DAHLSTEDT   RIGHT RENAL PELVIS AND LOWER POLE CALCULI   CYSTOSCOPY W/ RETROGRADES  06/02/2012   Procedure: CYSTOSCOPY WITH RETROGRADE PYELOGRAM;  Surgeon: Marcine Matar, MD;  Location[facility];  Service: Urology;  Laterality: Right;   CYSTOSCOPY WITH URETEROSCOPY  06/02/2012   Procedure: CYSTOSCOPY WITH URETEROSCOPY;  Surgeon: Marcine Matar, MD;  Location: [facility];  Service: Urology;  Laterality: Right;  90 MIN ALSO EXTRACTION OF CALCULI    EYE SURGERY Bilateral 2020   LITHOTRIPSY  APPROX.  1998   ESWL  left kidney x 2   ROTATOR CUFF REPAIR  2012   LEFT SHOULDER   SHOULDER ARTHROSCOPY WITH ROTATOR CUFF REPAIR AND SUBACROMIAL DECOMPRESSION  09/12/2012   Procedure: SHOULDER ARTHROSCOPY WITH ROTATOR CUFF REPAIR AND SUBACROMIAL DECOMPRESSION;  Surgeon: Mable Paris, MD;  Location: [facility];  Service: Orthopedics;  Laterality: Right;  see above and biceps tendonotomy   SKIN LESION EXCISION Right 07/07/2021   right side of face near right ear   TRANSURETHRAL RESECTION OF BLADDER NECK N/A 04/08/2018   Procedure: TRANSURETHRAL RESECTION OF BLADDER NECK CONTRACTURE;  Surgeon: Marcine Matar, MD;  Location: [facility];  Service: Urology;  Laterality: N/A;   TRANSURETHRAL RESECTION OF PROSTATE  11/29/2009   BPH   VASECTOMY  1970'S   GEN.  ANES.   Patient Active Problem List   Diagnosis Date Noted   Other fatigue 09/17/2023   Branch retinal artery occlusion of left eye 10/08/2022   Intermediate stage nonexudative age-related macular degeneration of left eye 04/07/2022   Aortic atherosclerosis (HCC) 01/07/2022   OSA (obstructive sleep apnea) 04/29/2021   CAD (coronary artery disease) 02/27/2021   Left epiretinal membrane 03/21/2020   Right epiretinal membrane 03/21/2020   Posterior vitreous detachment of right eye 03/21/2020   Posterior capsular opacification, right 03/21/2020   Bilateral hearing loss 07/04/2019   Mild cognitive impairment with memory loss 06/16/2019   Pulmonary nodules 12/20/2018   Metabolic syndrome 04/16/2016   Hearing loss of both ears  04/16/2016   Fatty liver disease, nonalcoholic 05/28/2015   Abdominal pain, left lower quadrant 12/06/2012   Nephrolithiasis 03/27/2011   HYPERCHOLESTEROLEMIA 12/09/2007   Testosterone deficiency 09/21/2007   Benign essential hypertension 04/07/2007   Class 1 obesity due to excess calories with serious comorbidity and body mass index (BMI) of 33.0 to 33.9 in adult 02/16/2007   Extrinsic asthma 02/16/2007   GERD 02/16/2007   Osteoarthritis 02/16/2007    PCP: Excell Seltzer, MD REFERRING PROVIDER: Excell Seltzer, MD   REFERRING DIAG: R42 (ICD-10-CM) - Vertigo   THERAPY DIAG:  Dizziness and giddiness  Unsteadiness on feet  Unspecified lack of coordination  Other symptoms and signs involving the nervous system  ONSET DATE: 2-3 years  Rationale for Evaluation and Treatment: Rehabilitation  SUBJECTIVE:   SUBJECTIVE STATEMENT: Pt states his vertigo is not new. Had vertigo sessions for 2-3 years. It occurs once every 6 months. Comes on semi quickly and he can tell when it's coming on. Gets nauseous and reports throwing up. Will be able to sleep it off and may feel washed out for a day or so. Pt states he feels that it changed this year. Pt reports he was looking down at his phone and had his vertigo (felt this onset was quicker and more severe). Has had 7 episodes in the past week. Has become more frequent. Pt reports balance issues for some time. Carries meclizine with him. Now is currently taking Vitamin D as well. Has not seen the spinning but more "excess movement"  Pt accompanied by: self  PERTINENT HISTORY: Hearing loss R worse than L  PAIN:  Are you having pain? No  PRECAUTIONS: Fall  RED FLAGS: None   WEIGHT BEARING RESTRICTIONS: No  FALLS: Has patient fallen in last 6 months? Yes. Number of falls 1 -- 3.5 weeks ago fell from dizziness (bumped into bathroom door and slid down)  LIVING ENVIRONMENT: Lives with: lives with their spouse Lives in:  House/apartment  PLOF: Independent  PATIENT GOALS: Improve dizziness and balance  OBJECTIVE:  Note: Objective measures were completed at Evaluation unless otherwise noted.  DIAGNOSTIC FINDINGS: 09/17/23 CT head IMPRESSION: 1. No acute intracranial abnormality. 2. Age related atrophy and chronic small vessel ischemia.  COGNITION: Overall cognitive status: Within functional limits for tasks assessed   SENSATION: WFL  POSTURE:  rounded shoulders and forward head  Cervical ROM: Age related stiffness but no pain  Active A/PROM (deg) eval  Flexion 100%  Extension 50%  Right lateral flexion   Left lateral flexion   Right rotation 80%  Left rotation 80%  (Blank rows = not tested)  STRENGTH: Did not formally assess  BED MOBILITY:  Independent with all mobility  TRANSFERS: Assistive device utilized: None  Sit to stand: Complete Independence Stand to sit: Complete Independence  GAIT: Gait pattern: step through pattern guarded and cautious Distance walked: Into clinic Assistive device utilized: None Level of assistance: Complete Independence Pt notes L gait deviation at baseline  FUNCTIONAL TESTS:  DGI to be assessed  PATIENT SURVEYS:  DHI Total DHI Score: 14 / 100 = 14 % (P: 4 / 24 E: 4 / 36 F: 6 / 40)  VESTIBULAR ASSESSMENT:  GENERAL OBSERVATION: forward head   SYMPTOM BEHAVIOR:  Subjective history: R ear hearing loss vs L ear; shot in L eye for clot (L eye is fuzzy s/p epiretinal peel), cataract lenses and stigmatism in both eyes, double vision in R eye (uses prism on R lens)  Non-Vestibular symptoms: nausea/vomiting  Type of dizziness: Imbalance (Disequilibrium), Spinning/Vertigo, and Lightheadedness/Faint  Frequency: 7 episodes within the past 5 weeks  Duration: an hour  Aggravating factors: Induced by motion: activity in general and sporadically; bending down and coming up  Relieving factors: lying supine, rest, and sleep  Progression of symptoms:  worse  OCULOMOTOR EXAM:  Ocular Alignment: normal  Ocular ROM: No Limitations  Spontaneous Nystagmus: absent  Gaze-Induced Nystagmus: absent  Smooth Pursuits: intact  Saccades: intact  FRENZEL - FIXATION SUPRESSED:  Did not assess  VESTIBULAR - OCULAR REFLEX:   Slow VOR: Normal  VOR Cancellation: Corrective Saccades with L head turn  Head-Impulse Test: HIT Right: negative HIT Left: positive with pt reporting some mild symptoms   POSITIONAL TESTING:  Dix-hallpike (-) L&R in modified supine position and sidelying position Sidelying test (-) L&R Roll test (-) L&R  Mildly symptomatic when coming from R sidelying vs L sidelying  MOTION SENSITIVITY:  Motion Sensitivity Quotient Intensity: 0 = none, 1 = Lightheaded, 2 = Mild, 3 = Moderate, 4 = Severe, 5 = Vomiting  Intensity  1. Sitting to supine   2. Supine to L side   3. Supine to R side   4. Supine to sitting   5. L Hallpike-Dix   6. Up from L    7. R Hallpike-Dix   8. Up from R    9. Sitting, head tipped to L knee   10. Head up from L knee   11. Sitting, head tipped to R knee   12. Head up from R knee   13. Sitting head turns x5   14.Sitting head nods x5   15. In stance, 180 turn to L    16. In stance, 180 turn to R     OTHOSTATICS: not done                                                                                                                            TREATMENT DATE: 10/11/23 Education about condition and POC   PATIENT EDUCATION: Education details: Exam findings, POC Person educated: Patient and Spouse Education method: Explanation, Demonstration, and Handouts Education comprehension: verbalized understanding, returned demonstration, and needs further education  HOME EXERCISE PROGRAM: To be initiated  GOALS: Goals reviewed with patient? Yes  SHORT TERM GOALS: Target date: 11/01/2023   Pt will be ind with initial HEP Baseline: Goal status: INITIAL  2.  Pt will report >/=50% improvement  in his dizziness Baseline:  Goal status: INITIAL    LONG TERM GOALS: Target date: 11/22/2023   Pt will be ind with management and progression of his HEP Baseline:  Goal status: INITIAL  2.  Pt will have improved DGI by >/=3 points to demo MDC Baseline:  Goal status: INITIAL  3.  Pt will have improved DHI to </=4% to demo MCID Baseline:  Goal status: INITIAL  4.  Pt will report >/=75% improvement in dizzy symptoms Baseline:  Goal status: INITIAL    ASSESSMENT:  CLINICAL IMPRESSION: Patient is a 83 y.o. M who was seen today for physical therapy evaluation and treatment for dizziness. PMH significant for eye conditions at baseline along with R > L hearing loss. Dizziness has been ongoing in the past 2-3 years but has had a recent exacerbation with increased frequency and intensity of his symptoms. Assessment was (-) for BPPV today; however, pt is demonstrating s/s most consistent with likely L vestibular hypofunction following a likely BPPV episode. Pt will benefit from PT to improve VOR function and balance for safer home and community mobility.  OBJECTIVE IMPAIRMENTS: Abnormal gait, decreased activity tolerance, decreased balance, decreased endurance, decreased mobility, decreased ROM, dizziness, improper body mechanics, and postural dysfunction.   ACTIVITY LIMITATIONS: bending, transfers, locomotion level, and    PARTICIPATION LIMITATIONS: driving and community activity  PERSONAL FACTORS: Age, Fitness, Past/current experiences, and Time since onset of injury/illness/exacerbation are also affecting patient's functional outcome.   REHAB POTENTIAL: Good  CLINICAL DECISION MAKING: Evolving/moderate complexity  EVALUATION COMPLEXITY: Moderate   PLAN:  PT FREQUENCY: 2x/week  PT DURATION: 6 weeks  PLANNED INTERVENTIONS: 97164- PT Re-evaluation, 97110-Therapeutic exercises, 97530- Therapeutic activity, 97112- Neuromuscular re-education, 97535- Self Care, [id number]- Manual  therapy, [id number]- Gait training, [phone]- Canalith repositioning, Patient/Family education, Balance training, Stair training, Vestibular training, Cryotherapy, and Moist heat  PLAN FOR NEXT SESSION: Assess DGI. Initiate vestibular exercises and balance exercises.    Giann Obara April Ma L Cinsere Mizrahi, PT 10/11/2023, 11:45 AM

## 2023-10-13 NOTE — Assessment & Plan Note (Signed)
 Chronic, intermittent Unremarkable workup in the ER with normal EKG, unremarkable head CT, normal metabolic panel and blood count. Normal orthostatic vitals.  He continues to use meclizine as needed. Discussed given recurrent symptoms he would likely benefit from vestibular rehab, PT referral placed.  He also has upcoming follow-up with ENT for further evaluation and treatment. He also does have a referral in process for neurology.  Of note in 2023 he had an MRI brain that showed mild chronic microvascular ischemic changes no retrocochlear lesion identified.

## 2023-10-14 DIAGNOSIS — R42 Dizziness and giddiness: Secondary | ICD-10-CM | POA: Diagnosis not present

## 2023-10-14 DIAGNOSIS — H903 Sensorineural hearing loss, bilateral: Secondary | ICD-10-CM | POA: Diagnosis not present

## 2023-10-15 ENCOUNTER — Ambulatory Visit: Payer: Medicare HMO | Admitting: Physical Therapy

## 2023-10-15 DIAGNOSIS — R29818 Other symptoms and signs involving the nervous system: Secondary | ICD-10-CM | POA: Diagnosis not present

## 2023-10-15 DIAGNOSIS — R42 Dizziness and giddiness: Secondary | ICD-10-CM

## 2023-10-15 DIAGNOSIS — R279 Unspecified lack of coordination: Secondary | ICD-10-CM | POA: Diagnosis not present

## 2023-10-15 DIAGNOSIS — R2681 Unsteadiness on feet: Secondary | ICD-10-CM

## 2023-10-15 NOTE — Therapy (Signed)
 OUTPATIENT PHYSICAL THERAPY TREATMENT  Patient Name: Jack Norris MRN: [id number] DOB:04-05-1941, 83 y.o., male Today's Date: 10/15/2023  END OF SESSION:  PT End of Session - 10/15/23 0918     Visit Number 2    Number of Visits 12    Date for PT Re-Evaluation 11/22/23    Authorization Type [employer]    Progress Note Due on Visit 10    PT Start Time 0925    PT Stop Time 1005    PT Time Calculation (min) 40 min              Past Medical History:  Diagnosis Date   Asthma, mild    as child-only now if has URI   BPH (benign prostatic hypertrophy)    BPPV (benign paroxysmal positional vertigo)    Diverticulosis    Eczema    Fatty liver disease, nonalcoholic    Frequent nosebleeds    History of   GERD (gastroesophageal reflux disease)    occ uses TUMS as needed   Glaucoma    History of bronchitis    History of colon polyps    History of diverticulitis    History of kidney stones    Hyperlipemia    Hypertension    Influenza A 11/2017   Memory changes    OA (osteoarthritis)    Obesity    Overactive bladder 2020   PVCs (premature ventricular contractions)    Renal calculi right    AND LEFT X1 NON-OBSTRUCTIVE   Right sided sciatica    Bilateral buttock and thighs   Simple renal cyst right interpoler, simpler in nature , asymptomatic  per urologist note (dr dahlstedt)  12-26-2011   BENIGN, has been drained   Testosterone deficiency    Wears glasses    Wears hearing aid    Past Surgical History:  Procedure Laterality Date   APPENDECTOMY     bone spur removal     BILATERAL GREAT TOE   COLONOSCOPY     CYSTO/ RIGHT RETROGRADE PYELOGRAM/ RIGHT URETER DILATATION/ RIGHT URETEROSCOPY/ LASER LITHOTRIPSY RIGHT RENAL PELVIS AND LOWER POLE CALCULI/ PLACEMENT STENT  01-11-2012  DR DAHLSTEDT   RIGHT RENAL PELVIS AND LOWER POLE CALCULI   CYSTOSCOPY W/ RETROGRADES  06/02/2012   Procedure: CYSTOSCOPY WITH RETROGRADE PYELOGRAM;  Surgeon: Marcine Matar, MD;   Location: [facility];  Service: Urology;  Laterality: Right;   CYSTOSCOPY WITH URETEROSCOPY  06/02/2012   Procedure: CYSTOSCOPY WITH URETEROSCOPY;  Surgeon: Marcine Matar, MD;  Location: [facility];  Service: Urology;  Laterality: Right;  90 MIN ALSO EXTRACTION OF CALCULI    EYE SURGERY Bilateral 2020   LITHOTRIPSY  APPROX.  1998   ESWL  left kidney x 2   ROTATOR CUFF REPAIR  2012   LEFT SHOULDER   SHOULDER ARTHROSCOPY WITH ROTATOR CUFF REPAIR AND SUBACROMIAL DECOMPRESSION  09/12/2012   Procedure: SHOULDER ARTHROSCOPY WITH ROTATOR CUFF REPAIR AND SUBACROMIAL DECOMPRESSION;  Surgeon: Mable Paris, MD;  Location: [facility];  Service: Orthopedics;  Laterality: Right;  see above and biceps tendonotomy   SKIN LESION EXCISION Right 07/07/2021   right side of face near right ear   TRANSURETHRAL RESECTION OF BLADDER NECK N/A 04/08/2018   Procedure: TRANSURETHRAL RESECTION OF BLADDER NECK CONTRACTURE;  Surgeon: Marcine Matar, MD;  Location: [facility];  Service: Urology;  Laterality: N/A;   TRANSURETHRAL RESECTION OF PROSTATE  11/29/2009   BPH   VASECTOMY  1970'S   GEN.  ANES.   Patient Active Problem List   Diagnosis Date Noted   Other fatigue 09/17/2023   Branch retinal artery occlusion of left eye 10/08/2022   Intermediate stage nonexudative age-related macular degeneration of left eye 04/07/2022   Aortic atherosclerosis (HCC) 01/07/2022   OSA (obstructive sleep apnea) 04/29/2021   CAD (coronary artery disease) 02/27/2021   Left epiretinal membrane 03/21/2020   Right epiretinal membrane 03/21/2020   Posterior vitreous detachment of right eye 03/21/2020   Posterior capsular opacification, right 03/21/2020   Bilateral hearing loss 07/04/2019   Mild cognitive impairment with memory loss 06/16/2019   Pulmonary nodules 12/20/2018   Metabolic syndrome 04/16/2016   Hearing loss of both ears 04/16/2016   Fatty  liver disease, nonalcoholic 05/28/2015   Abdominal pain, left lower quadrant 12/06/2012   Vertigo 02/12/2012   Nephrolithiasis 03/27/2011   HYPERCHOLESTEROLEMIA 12/09/2007   Testosterone deficiency 09/21/2007   Benign essential hypertension 04/07/2007   Class 1 obesity due to excess calories with serious comorbidity and body mass index (BMI) of 33.0 to 33.9 in adult 02/16/2007   Extrinsic asthma 02/16/2007   GERD 02/16/2007   Osteoarthritis 02/16/2007    PCP: Excell Seltzer, MD REFERRING PROVIDER: Excell Seltzer, MD   REFERRING DIAG: R42 (ICD-10-CM) - Vertigo   THERAPY DIAG:  Dizziness and giddiness  Unsteadiness on feet  Unspecified lack of coordination  Other symptoms and signs involving the nervous system  ONSET DATE: 2-3 years  Rationale for Evaluation and Treatment: Rehabilitation  SUBJECTIVE:   SUBJECTIVE STATEMENT: Pt endorses no changes or new dizziness this morning. Did have to take one meclizine yesterday. Did not take it this morning.   From eval: Pt states his vertigo is not new. Had vertigo sessions for 2-3 years. It occurs once every 6 months. Comes on semi quickly and he can tell when it's coming on. Gets nauseous and reports throwing up. Will be able to sleep it off and may feel washed out for a day or so. Pt states he feels that it changed this year. Pt reports he was looking down at his phone and had his vertigo (felt this onset was quicker and more severe). Has had 7 episodes in the past week. Has become more frequent. Pt reports balance issues for some time. Carries meclizine with him. Now is currently taking Vitamin D as well. Has not seen the spinning but more "excess movement"  Pt accompanied by: self  PERTINENT HISTORY: Hearing loss R worse than L  PAIN:  Are you having pain? No  PRECAUTIONS: Fall  RED FLAGS: None   WEIGHT BEARING RESTRICTIONS: No  FALLS: Has patient fallen in last 6 months? Yes. Number of falls 1 -- 3.5 weeks ago fell from  dizziness (bumped into bathroom door and slid down)  LIVING ENVIRONMENT: Lives with: lives with their spouse Lives in: House/apartment  PLOF: Independent  PATIENT GOALS: Improve dizziness and balance  OBJECTIVE:  Note: Objective measures were completed at Evaluation unless otherwise noted.  DIAGNOSTIC FINDINGS: 09/17/23 CT head IMPRESSION: 1. No acute intracranial abnormality. 2. Age related atrophy and chronic small vessel ischemia.  POSTURE:  rounded shoulders and forward head  Cervical ROM: Age related stiffness but no pain  Active A/PROM (deg) eval  Flexion 100%  Extension 50%  Right lateral flexion   Left lateral flexion   Right rotation 80%  Left rotation 80%  (Blank rows = not tested)  STRENGTH: Did not formally assess   GAIT: Gait pattern: step through pattern guarded and  cautious Distance walked: Into clinic Assistive device utilized: None Level of assistance: Complete Independence Pt notes L gait deviation at baseline  FUNCTIONAL TESTS:  FGA: 23/30 MCTSIB: 26 sec condition 1; 24 sec condition 2; 30 sec condition 3; 30 sec with increased sway condition 4  [facility] PT Assessment - 10/15/23 0001       Functional Gait  Assessment   Gait assessed  Yes    Gait Level Surface Walks 20 ft in less than 7 sec but greater than 5.5 sec, uses assistive device, slower speed, mild gait deviations, or deviates 6-10 in outside of the 12 in walkway width.   6.3 sec   Change in Gait Speed Able to smoothly change walking speed without loss of balance or gait deviation. Deviate no more than 6 in outside of the 12 in walkway width.    Gait with Horizontal Head Turns Performs head turns smoothly with no change in gait. Deviates no more than 6 in outside 12 in walkway width    Gait with Vertical Head Turns Performs task with slight change in gait velocity (eg, minor disruption to smooth gait path), deviates 6 - 10 in outside 12 in walkway width or uses assistive device   minor LOB  with looking down   Gait and Pivot Turn Pivot turns safely within 3 sec and stops quickly with no loss of balance.    Step Over Obstacle Is able to step over 2 stacked shoe boxes taped together (9 in total height) without changing gait speed. No evidence of imbalance.    Gait with Narrow Base of Support Ambulates less than 4 steps heel to toe or cannot perform without assistance.    Gait with Eyes Closed Walks 20 ft, uses assistive device, slower speed, mild gait deviations, deviates 6-10 in outside 12 in walkway width. Ambulates 20 ft in less than 9 sec but greater than 7 sec.    Ambulating Backwards Walks 20 ft, no assistive devices, good speed, no evidence for imbalance, normal gait    Steps Alternating feet, must use rail.    Total Score 23    FGA comment: 23/30             PATIENT SURVEYS:  DHI Total DHI Score: 14 / 100 = 14 % (P: 4 / 24 E: 4 / 36 F: 6 / 40)  VESTIBULAR ASSESSMENT:  GENERAL OBSERVATION: forward head   SYMPTOM BEHAVIOR:  Subjective history: R ear hearing loss vs L ear; shot in L eye for clot (L eye is fuzzy s/p epiretinal peel), cataract lenses and stigmatism in both eyes, double vision in R eye (uses prism on R lens)  Non-Vestibular symptoms: nausea/vomiting  Type of dizziness: Imbalance (Disequilibrium), Spinning/Vertigo, and Lightheadedness/Faint  Frequency: 7 episodes within the past 5 weeks  Duration: an hour  Aggravating factors: Induced by motion: activity in general and sporadically; bending down and coming up  Relieving factors: lying supine, rest, and sleep  Progression of symptoms: worse  OCULOMOTOR EXAM:  Ocular Alignment: normal  Ocular ROM: No Limitations  Spontaneous Nystagmus: absent  Gaze-Induced Nystagmus: absent  Smooth Pursuits: intact  Saccades: intact  FRENZEL - FIXATION SUPRESSED:  Did not assess  VESTIBULAR - OCULAR REFLEX:   Slow VOR: Normal  VOR Cancellation: Corrective Saccades with L head turn  Head-Impulse Test: HIT  Right: negative HIT Left: positive with pt reporting some mild symptoms   POSITIONAL TESTING:  Dix-hallpike (-) L&R in modified supine position and sidelying position Sidelying test (-)  L&R Roll test (-) L&R  Mildly symptomatic when coming from R sidelying vs L sidelying  MOTION SENSITIVITY:  Motion Sensitivity Quotient Intensity: 0 = none, 1 = Lightheaded, 2 = Mild, 3 = Moderate, 4 = Severe, 5 = Vomiting  Intensity  1. Sitting to supine   2. Supine to L side   3. Supine to R side   4. Supine to sitting   5. L Hallpike-Dix   6. Up from L    7. R Hallpike-Dix   8. Up from R    9. Sitting, head tipped to L knee   10. Head up from L knee   11. Sitting, head tipped to R knee   12. Head up from R knee   13. Sitting head turns x5   14.Sitting head nods x5   15. In stance, 180 turn to L    16. In stance, 180 turn to R     OTHOSTATICS: not done                                                                                                                            TREATMENT DATE:  10/15/23 See above for balance testing Standing feet together  Smooth pursuit 2x30" horizontal and then vertical  Saccades 2x30" horizontal and then vertical  VOR x1; 2 x30" horizontal and then vertical Eyes closed x30", with head turns and then head nods 2x30"  10/11/23 Education about condition and POC   PATIENT EDUCATION: Education details: HEP, discussed dosage and intensity Person educated: Patient and Spouse Education method: Explanation, Demonstration, and Handouts Education comprehension: verbalized understanding, returned demonstration, and needs further education  HOME EXERCISE PROGRAM: To be initiated  GOALS: Goals reviewed with patient? Yes  SHORT TERM GOALS: Target date: 11/01/2023   Pt will be ind with initial HEP Baseline: Goal status: INITIAL  2.  Pt will report >/=50% improvement in his dizziness Baseline:  Goal status: INITIAL    LONG TERM GOALS: Target date:  11/22/2023   Pt will be ind with management and progression of his HEP Baseline:  Goal status: INITIAL  2.  Pt will have improved DGI by >/=3 points to demo MDC Baseline:  Goal status: INITIAL  3.  Pt will have improved DHI to </=4% to demo MCID Baseline:  Goal status: INITIAL  4.  Pt will report >/=75% improvement in dizzy symptoms Baseline:  Goal status: INITIAL    ASSESSMENT:  CLINICAL IMPRESSION: Assessed pt's balance and initiated balance and vestibular exercises to good pt tolerance. Pt with some mild symptoms of dizziness after performing. Most challenged with eyes closed and head nods.   From eval: Patient is a 83 y.o. M who was seen today for physical therapy evaluation and treatment for dizziness. PMH significant for eye conditions at baseline along with R > L hearing loss. Dizziness has been ongoing in the past 2-3 years but has had a recent exacerbation with increased frequency and intensity of  his symptoms. Assessment was (-) for BPPV today; however, pt is demonstrating s/s most consistent with likely L vestibular hypofunction following a likely BPPV episode. Pt will benefit from PT to improve VOR function and balance for safer home and community mobility.  OBJECTIVE IMPAIRMENTS: Abnormal gait, decreased activity tolerance, decreased balance, decreased endurance, decreased mobility, decreased ROM, dizziness, improper body mechanics, and postural dysfunction.     PLAN:  PT FREQUENCY: 2x/week  PT DURATION: 6 weeks  PLANNED INTERVENTIONS: 97164- PT Re-evaluation, 97110-Therapeutic exercises, 97530- Therapeutic activity, [id number]- Neuromuscular re-education, 97535- Self Care, 16109- Manual therapy, [phone]- Gait training, [phone]- Canalith repositioning, Patient/Family education, Balance training, Stair training, Vestibular training, Cryotherapy, and Moist heat  PLAN FOR NEXT SESSION: Progress vestibular exercises and balance exercises.    Bibiana Gillean April Ma L Kelicia Youtz,  PT 10/15/2023, 10:07 AM

## 2023-10-18 ENCOUNTER — Ambulatory Visit: Payer: Medicare HMO | Admitting: Physical Therapy

## 2023-10-18 ENCOUNTER — Ambulatory Visit (INDEPENDENT_AMBULATORY_CARE_PROVIDER_SITE_OTHER): Payer: Medicare HMO

## 2023-10-18 DIAGNOSIS — R279 Unspecified lack of coordination: Secondary | ICD-10-CM | POA: Diagnosis not present

## 2023-10-18 DIAGNOSIS — R2681 Unsteadiness on feet: Secondary | ICD-10-CM | POA: Diagnosis not present

## 2023-10-18 DIAGNOSIS — I4891 Unspecified atrial fibrillation: Secondary | ICD-10-CM

## 2023-10-18 DIAGNOSIS — R42 Dizziness and giddiness: Secondary | ICD-10-CM | POA: Diagnosis not present

## 2023-10-18 DIAGNOSIS — R29818 Other symptoms and signs involving the nervous system: Secondary | ICD-10-CM | POA: Diagnosis not present

## 2023-10-18 NOTE — Therapy (Signed)
 OUTPATIENT PHYSICAL THERAPY TREATMENT  Patient Name: Jack Norris MRN: [id number] DOB:11-10-1940, 83 y.o., male Today's Date: 10/18/2023  END OF SESSION:  PT End of Session - 10/18/23 0918     Visit Number 3    Number of Visits 12    Date for PT Re-Evaluation 11/22/23    Authorization Type [employer]    Progress Note Due on Visit 10    PT Start Time 0925    PT Stop Time 1005    PT Time Calculation (min) 40 min             Past Medical History:  Diagnosis Date   Asthma, mild    as child-only now if has URI   BPH (benign prostatic hypertrophy)    BPPV (benign paroxysmal positional vertigo)    Diverticulosis    Eczema    Fatty liver disease, nonalcoholic    Frequent nosebleeds    History of   GERD (gastroesophageal reflux disease)    occ uses TUMS as needed   Glaucoma    History of bronchitis    History of colon polyps    History of diverticulitis    History of kidney stones    Hyperlipemia    Hypertension    Influenza A 11/2017   Memory changes    OA (osteoarthritis)    Obesity    Overactive bladder 2020   PVCs (premature ventricular contractions)    Renal calculi right    AND LEFT X1 NON-OBSTRUCTIVE   Right sided sciatica    Bilateral buttock and thighs   Simple renal cyst right interpoler, simpler in nature , asymptomatic  per urologist note (dr dahlstedt)  12-26-2011   BENIGN, has been drained   Testosterone deficiency    Wears glasses    Wears hearing aid    Past Surgical History:  Procedure Laterality Date   APPENDECTOMY     bone spur removal     BILATERAL GREAT TOE   COLONOSCOPY     CYSTO/ RIGHT RETROGRADE PYELOGRAM/ RIGHT URETER DILATATION/ RIGHT URETEROSCOPY/ LASER LITHOTRIPSY RIGHT RENAL PELVIS AND LOWER POLE CALCULI/ PLACEMENT STENT  01-11-2012  DR DAHLSTEDT   RIGHT RENAL PELVIS AND LOWER POLE CALCULI   CYSTOSCOPY W/ RETROGRADES  06/02/2012   Procedure: CYSTOSCOPY WITH RETROGRADE PYELOGRAM;  Surgeon: Marcine Matar, MD;   Location: [facility];  Service: Urology;  Laterality: Right;   CYSTOSCOPY WITH URETEROSCOPY  06/02/2012   Procedure: CYSTOSCOPY WITH URETEROSCOPY;  Surgeon: Marcine Matar, MD;  Location: [facility];  Service: Urology;  Laterality: Right;  90 MIN ALSO EXTRACTION OF CALCULI    EYE SURGERY Bilateral 2020   LITHOTRIPSY  APPROX.  1998   ESWL  left kidney x 2   ROTATOR CUFF REPAIR  2012   LEFT SHOULDER   SHOULDER ARTHROSCOPY WITH ROTATOR CUFF REPAIR AND SUBACROMIAL DECOMPRESSION  09/12/2012   Procedure: SHOULDER ARTHROSCOPY WITH ROTATOR CUFF REPAIR AND SUBACROMIAL DECOMPRESSION;  Surgeon: Mable Paris, MD;  Location: [facility];  Service: Orthopedics;  Laterality: Right;  see above and biceps tendonotomy   SKIN LESION EXCISION Right 07/07/2021   right side of face near right ear   TRANSURETHRAL RESECTION OF BLADDER NECK N/A 04/08/2018   Procedure: TRANSURETHRAL RESECTION OF BLADDER NECK CONTRACTURE;  Surgeon: Marcine Matar, MD;  Location: [facility];  Service: Urology;  Laterality: N/A;   TRANSURETHRAL RESECTION OF PROSTATE  11/29/2009   BPH   VASECTOMY  1970'S   GEN. ANES.  Patient Active Problem List   Diagnosis Date Noted   Other fatigue 09/17/2023   Branch retinal artery occlusion of left eye 10/08/2022   Intermediate stage nonexudative age-related macular degeneration of left eye 04/07/2022   Aortic atherosclerosis (HCC) 01/07/2022   OSA (obstructive sleep apnea) 04/29/2021   CAD (coronary artery disease) 02/27/2021   Left epiretinal membrane 03/21/2020   Right epiretinal membrane 03/21/2020   Posterior vitreous detachment of right eye 03/21/2020   Posterior capsular opacification, right 03/21/2020   Bilateral hearing loss 07/04/2019   Mild cognitive impairment with memory loss 06/16/2019   Pulmonary nodules 12/20/2018   Metabolic syndrome 04/16/2016   Hearing loss of both ears 04/16/2016   Fatty  liver disease, nonalcoholic 05/28/2015   Abdominal pain, left lower quadrant 12/06/2012   Vertigo 02/12/2012   Nephrolithiasis 03/27/2011   HYPERCHOLESTEROLEMIA 12/09/2007   Testosterone deficiency 09/21/2007   Benign essential hypertension 04/07/2007   Class 1 obesity due to excess calories with serious comorbidity and body mass index (BMI) of 33.0 to 33.9 in adult 02/16/2007   Extrinsic asthma 02/16/2007   GERD 02/16/2007   Osteoarthritis 02/16/2007    PCP: Excell Seltzer, MD REFERRING PROVIDER: Excell Seltzer, MD   REFERRING DIAG: R42 (ICD-10-CM) - Vertigo   THERAPY DIAG:  Dizziness and giddiness  Unsteadiness on feet  Unspecified lack of coordination  Other symptoms and signs involving the nervous system  ONSET DATE: 2-3 years  Rationale for Evaluation and Treatment: Rehabilitation  SUBJECTIVE:   SUBJECTIVE STATEMENT: Pt states he is feeling whoozy this morning. States when he looks down at his cellphone and then looks up he gets excess motion this morning. Was feeling okay during the weekend. Did exercises yesterday. No problems. 2 or 3/10 this morning.   From eval: Pt states his vertigo is not new. Had vertigo sessions for 2-3 years. It occurs once every 6 months. Comes on semi quickly and he can tell when it's coming on. Gets nauseous and reports throwing up. Will be able to sleep it off and may feel washed out for a day or so. Pt states he feels that it changed this year. Pt reports he was looking down at his phone and had his vertigo (felt this onset was quicker and more severe). Has had 7 episodes in the past week. Has become more frequent. Pt reports balance issues for some time. Carries meclizine with him. Now is currently taking Vitamin D as well. Has not seen the spinning but more "excess movement"  Pt accompanied by: self  PERTINENT HISTORY: Hearing loss R worse than L  PAIN:  Are you having pain? No  PRECAUTIONS: Fall  WEIGHT BEARING RESTRICTIONS:  No  FALLS: Has patient fallen in last 6 months? Yes. Number of falls 1 -- 3.5 weeks ago fell from dizziness (bumped into bathroom door and slid down)  PATIENT GOALS: Improve dizziness and balance  OBJECTIVE:  Note: Objective measures were completed at Evaluation unless otherwise noted.  Cervical ROM: Age related stiffness but no pain  Active A/PROM (deg) eval  Flexion 100%  Extension 50%  Right lateral flexion   Left lateral flexion   Right rotation 80%  Left rotation 80%  (Blank rows = not tested)  FUNCTIONAL TESTS:  FGA: 23/30 MCTSIB: 26 sec condition 1; 24 sec condition 2; 30 sec condition 3; 30 sec with increased sway condition 4   PATIENT SURVEYS:  DHI Total DHI Score: 14 / 100 = 14 % (P: 4 / 24 E: 4 /  36 F: 6 / 40)  VESTIBULAR ASSESSMENT:  GENERAL OBSERVATION: forward head   SYMPTOM BEHAVIOR:  Subjective history: R ear hearing loss vs L ear; shot in L eye for clot (L eye is fuzzy s/p epiretinal peel), cataract lenses and stigmatism in both eyes, double vision in R eye (uses prism on R lens)  Non-Vestibular symptoms: nausea/vomiting  Type of dizziness: Imbalance (Disequilibrium), Spinning/Vertigo, and Lightheadedness/Faint  Frequency: 7 episodes within the past 5 weeks  Duration: an hour  Aggravating factors: Induced by motion: activity in general and sporadically; bending down and coming up  Relieving factors: lying supine, rest, and sleep  Progression of symptoms: worse  VESTIBULAR - OCULAR REFLEX:   Slow VOR: Normal  VOR Cancellation: Corrective Saccades with L head turn  Head-Impulse Test: HIT Right: negative HIT Left: positive with pt reporting some mild symptoms   POSITIONAL TESTING:  Dix-hallpike (-) L&R in modified supine position and sidelying position Sidelying test (-) L&R Roll test (-) L&R  Mildly symptomatic when coming from R sidelying vs L sidelying                                                                                   TREATMENT  DATE:  10/18/23 Standing feet together  Feet together static standing x30"  Smooth pursuit 2x30" horizontal, vertical, and diagonal  Convergence 2x30"  Saccades 2x30" looking down at "X" on chair and then up at "X"; diagonals with "x" on chair  Corrective saccades 2x30" looking down at "X" on chair and then up at "X"; diagonals with "x" on chair  VOR x1; 2 x30" horizontal and then vertical Eyes closed x30", with head turns and then head nods 2x30" Eyes closed on airex 2x30"    PATIENT EDUCATION: Education details: HEP updates/modifications Person educated: Patient and Spouse Education method: Explanation, Demonstration, and Handouts Education comprehension: verbalized understanding, returned demonstration, and needs further education  HOME EXERCISE PROGRAM: Access Code: [email or web address] URL: [email or web address] Date: 10/18/2023 Prepared by: Vernon Prey April Kirstie Peri  Exercises - Standing Feet Together Smooth Pursuit  - 2 x daily - 7 x weekly - 2 sets - 30 sec hold - Standing Horizontal Saccades  - 2 x daily - 7 x weekly - 2 sets - 30 sec hold - Standing Vertical Saccades  - 2 x daily - 7 x weekly - 2 sets - 30 sec hold - Standing Gaze Stabilization with Head Rotation  - 2 x daily - 7 x weekly - 2 sets - 30 sec hold - Standing Gaze Stabilization with Head Nod  - 2 x daily - 7 x weekly - 2 sets - 30 sec hold - Narrow Stance with Eyes Closed and Head Nods  - 2 x daily - 7 x weekly - 2 sets - 30 sec hold - Narrow Stance with Eyes Closed and Head Rotation  - 2 x daily - 7 x weekly - 2 sets - 30 sec hold  GOALS: Goals reviewed with patient? Yes  SHORT TERM GOALS: Target date: 11/01/2023   Pt will be ind with initial HEP Baseline: Goal status: INITIAL  2.  Pt will report >/=50% improvement in his dizziness Baseline:  Goal status:  INITIAL    LONG TERM GOALS: Target date: 11/22/2023   Pt will be ind with management and progression of his HEP Baseline:  Goal status:  INITIAL  2.  Pt will have improved FGA by >/=6 points to demo MDC Baseline:  Goal status: INITIAL  3.  Pt will have improved DHI to </=4% to demo MCID Baseline:  Goal status: INITIAL  4.  Pt will report >/=75% improvement in dizzy symptoms Baseline:  Goal status: INITIAL    ASSESSMENT:  CLINICAL IMPRESSION: Pt mildly dizzy this morning. Continued to work on habituation and gaze stabilization exercises. Worked on corrective saccades to decrease his feelings of excess motion when he looks up from using his phone. Able to add balance exercises on airex today with 1 posterior LOB.   From eval: Patient is a 83 y.o. M who was seen today for physical therapy evaluation and treatment for dizziness. PMH significant for eye conditions at baseline along with R > L hearing loss. Dizziness has been ongoing in the past 2-3 years but has had a recent exacerbation with increased frequency and intensity of his symptoms. Assessment was (-) for BPPV today; however, pt is demonstrating s/s most consistent with likely L vestibular hypofunction following a likely BPPV episode. Pt will benefit from PT to improve VOR function and balance for safer home and community mobility.  OBJECTIVE IMPAIRMENTS: Abnormal gait, decreased activity tolerance, decreased balance, decreased endurance, decreased mobility, decreased ROM, dizziness, improper body mechanics, and postural dysfunction.     PLAN:  PT FREQUENCY: 2x/week  PT DURATION: 6 weeks  PLANNED INTERVENTIONS: 97164- PT Re-evaluation, 97110-Therapeutic exercises, 97530- Therapeutic activity, [id number]- Neuromuscular re-education, 97535- Self Care, [id number]- Manual therapy, [phone]- Gait training, [phone]- Canalith repositioning, Patient/Family education, Balance training, Stair training, Vestibular training, Cryotherapy, and Moist heat  PLAN FOR NEXT SESSION: Progress vestibular exercises and balance exercises.    Jeromey Kruer April Ma L Draper Gallon, PT 10/18/2023, 9:19 AM

## 2023-10-20 ENCOUNTER — Ambulatory Visit: Payer: Medicare HMO | Admitting: Physical Therapy

## 2023-10-20 DIAGNOSIS — R42 Dizziness and giddiness: Secondary | ICD-10-CM

## 2023-10-20 DIAGNOSIS — R279 Unspecified lack of coordination: Secondary | ICD-10-CM | POA: Diagnosis not present

## 2023-10-20 DIAGNOSIS — R29818 Other symptoms and signs involving the nervous system: Secondary | ICD-10-CM | POA: Diagnosis not present

## 2023-10-20 DIAGNOSIS — R2681 Unsteadiness on feet: Secondary | ICD-10-CM | POA: Diagnosis not present

## 2023-10-20 NOTE — Therapy (Signed)
 OUTPATIENT PHYSICAL THERAPY TREATMENT  Patient Name: Jack Norris MRN: [id number] DOB:05/01/1941, 83 y.o., male Today's Date: 10/20/2023  END OF SESSION:  PT End of Session - 10/20/23 0915     Visit Number 4    Number of Visits 12    Date for PT Re-Evaluation 11/22/23    Authorization Type [employer]    Progress Note Due on Visit 10    PT Start Time 0920    PT Stop Time 1000    PT Time Calculation (min) 40 min    Activity Tolerance Patient tolerated treatment well              Past Medical History:  Diagnosis Date   Asthma, mild    as child-only now if has URI   BPH (benign prostatic hypertrophy)    BPPV (benign paroxysmal positional vertigo)    Diverticulosis    Eczema    Fatty liver disease, nonalcoholic    Frequent nosebleeds    History of   GERD (gastroesophageal reflux disease)    occ uses TUMS as needed   Glaucoma    History of bronchitis    History of colon polyps    History of diverticulitis    History of kidney stones    Hyperlipemia    Hypertension    Influenza A 11/2017   Memory changes    OA (osteoarthritis)    Obesity    Overactive bladder 2020   PVCs (premature ventricular contractions)    Renal calculi right    AND LEFT X1 NON-OBSTRUCTIVE   Right sided sciatica    Bilateral buttock and thighs   Simple renal cyst right interpoler, simpler in nature , asymptomatic  per urologist note (dr dahlstedt)  12-26-2011   BENIGN, has been drained   Testosterone deficiency    Wears glasses    Wears hearing aid    Past Surgical History:  Procedure Laterality Date   APPENDECTOMY     bone spur removal     BILATERAL GREAT TOE   COLONOSCOPY     CYSTO/ RIGHT RETROGRADE PYELOGRAM/ RIGHT URETER DILATATION/ RIGHT URETEROSCOPY/ LASER LITHOTRIPSY RIGHT RENAL PELVIS AND LOWER POLE CALCULI/ PLACEMENT STENT  01-11-2012  DR DAHLSTEDT   RIGHT RENAL PELVIS AND LOWER POLE CALCULI   CYSTOSCOPY W/ RETROGRADES  06/02/2012   Procedure: CYSTOSCOPY WITH  RETROGRADE PYELOGRAM;  Surgeon: Marcine Matar, MD;  Location: [facility];  Service: Urology;  Laterality: Right;   CYSTOSCOPY WITH URETEROSCOPY  06/02/2012   Procedure: CYSTOSCOPY WITH URETEROSCOPY;  Surgeon: Marcine Matar, MD;  Location: [facility];  Service: Urology;  Laterality: Right;  90 MIN ALSO EXTRACTION OF CALCULI    EYE SURGERY Bilateral 2020   LITHOTRIPSY  APPROX.  1998   ESWL  left kidney x 2   ROTATOR CUFF REPAIR  2012   LEFT SHOULDER   SHOULDER ARTHROSCOPY WITH ROTATOR CUFF REPAIR AND SUBACROMIAL DECOMPRESSION  09/12/2012   Procedure: SHOULDER ARTHROSCOPY WITH ROTATOR CUFF REPAIR AND SUBACROMIAL DECOMPRESSION;  Surgeon: Mable Paris, MD;  Location: [facility];  Service: Orthopedics;  Laterality: Right;  see above and biceps tendonotomy   SKIN LESION EXCISION Right 07/07/2021   right side of face near right ear   TRANSURETHRAL RESECTION OF BLADDER NECK N/A 04/08/2018   Procedure: TRANSURETHRAL RESECTION OF BLADDER NECK CONTRACTURE;  Surgeon: Marcine Matar, MD;  Location: [facility];  Service: Urology;  Laterality: N/A;   TRANSURETHRAL RESECTION OF PROSTATE  11/29/2009  BPH   VASECTOMY  1970'S   GEN. ANES.   Patient Active Problem List   Diagnosis Date Noted   Other fatigue 09/17/2023   Branch retinal artery occlusion of left eye 10/08/2022   Intermediate stage nonexudative age-related macular degeneration of left eye 04/07/2022   Aortic atherosclerosis (HCC) 01/07/2022   OSA (obstructive sleep apnea) 04/29/2021   CAD (coronary artery disease) 02/27/2021   Left epiretinal membrane 03/21/2020   Right epiretinal membrane 03/21/2020   Posterior vitreous detachment of right eye 03/21/2020   Posterior capsular opacification, right 03/21/2020   Bilateral hearing loss 07/04/2019   Mild cognitive impairment with memory loss 06/16/2019   Pulmonary nodules 12/20/2018   Metabolic syndrome  04/16/2016   Hearing loss of both ears 04/16/2016   Fatty liver disease, nonalcoholic 05/28/2015   Abdominal pain, left lower quadrant 12/06/2012   Vertigo 02/12/2012   Nephrolithiasis 03/27/2011   HYPERCHOLESTEROLEMIA 12/09/2007   Testosterone deficiency 09/21/2007   Benign essential hypertension 04/07/2007   Class 1 obesity due to excess calories with serious comorbidity and body mass index (BMI) of 33.0 to 33.9 in adult 02/16/2007   Extrinsic asthma 02/16/2007   GERD 02/16/2007   Osteoarthritis 02/16/2007    PCP: Excell Seltzer, MD REFERRING PROVIDER: Excell Seltzer, MD   REFERRING DIAG: R42 (ICD-10-CM) - Vertigo   THERAPY DIAG:  Dizziness and giddiness  Unsteadiness on feet  Unspecified lack of coordination  Other symptoms and signs involving the nervous system  ONSET DATE: 2-3 years  Rationale for Evaluation and Treatment: Rehabilitation  SUBJECTIVE:   SUBJECTIVE STATEMENT: Pt states that when he's on the computer in the morning for a few hours and then looks up it gives him some feelings of vertigo/dizziness. Pt states he did his exercises yesterday. Wife reports that pt had a little bit of vertigo on Monday afternoon but pt was unable to state when he felt this specifically. Laid down and felt better. Pt reports some issues with looking at a target with one eye open where one image looks slightly larger and higher than the other with the other eye open.   From eval: Pt states his vertigo is not new. Had vertigo sessions for 2-3 years. It occurs once every 6 months. Comes on semi quickly and he can tell when it's coming on. Gets nauseous and reports throwing up. Will be able to sleep it off and may feel washed out for a day or so. Pt states he feels that it changed this year. Pt reports he was looking down at his phone and had his vertigo (felt this onset was quicker and more severe). Has had 7 episodes in the past week. Has become more frequent. Pt reports balance issues  for some time. Carries meclizine with him. Now is currently taking Vitamin D as well. Has not seen the spinning but more "excess movement"  Pt accompanied by: self  PERTINENT HISTORY: Hearing loss R worse than L  PAIN:  Are you having pain? No  PRECAUTIONS: Fall  WEIGHT BEARING RESTRICTIONS: No  FALLS: Has patient fallen in last 6 months? Yes. Number of falls 1 -- 3.5 weeks ago fell from dizziness (bumped into bathroom door and slid down)  PATIENT GOALS: Improve dizziness and balance  OBJECTIVE:  Note: Objective measures were completed at Evaluation unless otherwise noted.  Cervical ROM: Age related stiffness but no pain  Active A/PROM (deg) eval  Flexion 100%  Extension 50%  Right lateral flexion   Left lateral flexion  Right rotation 80%  Left rotation 80%  (Blank rows = not tested)  FUNCTIONAL TESTS:  FGA: 23/30 MCTSIB: 26 sec condition 1; 24 sec condition 2; 30 sec condition 3; 30 sec with increased sway condition 4   PATIENT SURVEYS:  DHI Total DHI Score: 14 / 100 = 14 % (P: 4 / 24 E: 4 / 36 F: 6 / 40)  VESTIBULAR ASSESSMENT:  GENERAL OBSERVATION: forward head   SYMPTOM BEHAVIOR:  Subjective history: R ear hearing loss vs L ear; shot in L eye for clot (L eye is fuzzy s/p epiretinal peel), cataract lenses and stigmatism in both eyes, double vision in R eye (uses prism on R lens)  Non-Vestibular symptoms: nausea/vomiting  Type of dizziness: Imbalance (Disequilibrium), Spinning/Vertigo, and Lightheadedness/Faint  Frequency: 7 episodes within the past 5 weeks  Duration: an hour  Aggravating factors: Induced by motion: activity in general and sporadically; bending down and coming up  Relieving factors: lying supine, rest, and sleep  Progression of symptoms: worse  VESTIBULAR - OCULAR REFLEX:   Slow VOR: Positive Bilaterally  VOR Cancellation: Corrective Saccades with L head turn  Head-Impulse Test: HIT Right: negative HIT Left: positive with pt reporting some  mild symptoms   POSITIONAL TESTING:  Dix-hallpike (-) L&R in modified supine position and sidelying position Sidelying test (-) L&R Roll test (-) L&R  Mildly symptomatic when coming from R sidelying vs L sidelying                                                                                   TREATMENT DATE:  10/20/23 Standing with busy background  Smooth pursuit 2x30" horizontal and then vertical with first both eyes open and then L & R eye closed  Convergence 2x30" Saccades 2x30" horizontal and then vertical  Corrective saccades 2x30" horizontal and then vertical  VOR x1; 2 x30" horizontal and then vertical  VOR x2; 2 x30" horizontal and then vertical    10/18/23 Standing feet together  Feet together static standing x30"  Smooth pursuit 2x30" horizontal, vertical, and diagonal  Convergence 2x30"  Saccades 2x30" looking down at "X" on chair and then up at "X"; diagonals with "x" on chair  Corrective saccades 2x30" looking down at "X" on chair and then up at "X"; diagonals with "x" on chair  VOR x1; 2 x30" horizontal and then vertical Eyes closed x30", with head turns and then head nods 2x30" Eyes closed on airex 2x30"    PATIENT EDUCATION: Education details: HEP updates/modifications Person educated: Patient and Spouse Education method: Explanation, Demonstration, and Handouts Education comprehension: verbalized understanding, returned demonstration, and needs further education  HOME EXERCISE PROGRAM: Access Code: [email or web address] URL: [email or web address] Date: 10/20/2023 Prepared by: Vernon Prey April Kirstie Peri  Exercises - Standing Feet Together Smooth Pursuit  - 2 x daily - 7 x weekly - 2 sets - 30 sec hold - Standing Gaze Stabilization with Head Rotation  - 2 x daily - 7 x weekly - 2 sets - 30 sec hold - Standing Gaze Stabilization with Head Nod  - 2 x daily - 7 x weekly - 2 sets - 30 sec hold - Narrow Stance with Eyes Closed and  Head Nods  - 2 x daily - 7  x weekly - 2 sets - 30 sec hold - Narrow Stance with Eyes Closed and Head Rotation  - 2 x daily - 7 x weekly - 2 sets - 30 sec hold - Standing Gaze Stabilization with Two Near Targets and Head Rotation  - 1 x daily - 7 x weekly - 2 sets - 30 sec hold - Standing Gaze Stabilization with Two Near Targets and Head Nod  - 1 x daily - 7 x weekly - 2 sets - 30 sec hold - Gaze Stability (VOR) x 2 Feet Together With Vertical Head Turns  - 1 x daily - 7 x weekly - 2 sets - 30 sec hold - Gaze Stability (VOR) x 2 Feet Together With Horizontal Head Turns  - 1 x daily - 7 x weekly - 2 sets - 30 sec hold  GOALS: Goals reviewed with patient? Yes  SHORT TERM GOALS: Target date: 11/01/2023   Pt will be ind with initial HEP Baseline: Goal status: INITIAL  2.  Pt will report >/=50% improvement in his dizziness Baseline:  Goal status: INITIAL    LONG TERM GOALS: Target date: 11/22/2023   Pt will be ind with management and progression of his HEP Baseline:  Goal status: INITIAL  2.  Pt will have improved FGA by >/=6 points to demo MDC Baseline:  Goal status: INITIAL  3.  Pt will have improved DHI to </=4% to demo MCID Baseline:  Goal status: INITIAL  4.  Pt will report >/=75% improvement in dizzy symptoms Baseline:  Goal status: INITIAL    ASSESSMENT:  CLINICAL IMPRESSION: Continued to work on progressing pt's balance and vestibular exercises. Improving VOR x1. Able to move on to VOR x2. Worked on dynamic balance with ambulation.   From eval: Patient is a 83 y.o. M who was seen today for physical therapy evaluation and treatment for dizziness. PMH significant for eye conditions at baseline along with R > L hearing loss. Dizziness has been ongoing in the past 2-3 years but has had a recent exacerbation with increased frequency and intensity of his symptoms. Assessment was (-) for BPPV today; however, pt is demonstrating s/s most consistent with likely L vestibular hypofunction following a  likely BPPV episode. Pt will benefit from PT to improve VOR function and balance for safer home and community mobility.  OBJECTIVE IMPAIRMENTS: Abnormal gait, decreased activity tolerance, decreased balance, decreased endurance, decreased mobility, decreased ROM, dizziness, improper body mechanics, and postural dysfunction.     PLAN:  PT FREQUENCY: 2x/week  PT DURATION: 6 weeks  PLANNED INTERVENTIONS: 97164- PT Re-evaluation, 97110-Therapeutic exercises, 97530- Therapeutic activity, [id number]- Neuromuscular re-education, 97535- Self Care, [id number]- Manual therapy, [phone]- Gait training, [phone]- Canalith repositioning, Patient/Family education, Balance training, Stair training, Vestibular training, Cryotherapy, and Moist heat  PLAN FOR NEXT SESSION: Progress vestibular exercises and balance exercises.    Jr Milliron April Ma L Nykia Turko, PT 10/20/2023, 9:48 AM

## 2023-10-21 LAB — CUP PACEART REMOTE DEVICE CHECK
Date Time Interrogation Session: 20250223232136
Implantable Pulse Generator Implant Date: 20240522

## 2023-10-21 NOTE — Progress Notes (Signed)
 [facility] Summary Report / Loop Recorder

## 2023-10-22 DIAGNOSIS — R42 Dizziness and giddiness: Secondary | ICD-10-CM | POA: Diagnosis not present

## 2023-10-22 DIAGNOSIS — H903 Sensorineural hearing loss, bilateral: Secondary | ICD-10-CM | POA: Diagnosis not present

## 2023-10-22 DIAGNOSIS — I672 Cerebral atherosclerosis: Secondary | ICD-10-CM | POA: Diagnosis not present

## 2023-10-22 DIAGNOSIS — H8190 Unspecified disorder of vestibular function, unspecified ear: Secondary | ICD-10-CM | POA: Diagnosis not present

## 2023-10-23 ENCOUNTER — Encounter: Payer: Self-pay | Admitting: Family Medicine

## 2023-10-25 ENCOUNTER — Ambulatory Visit: Payer: Medicare HMO | Attending: Family Medicine | Admitting: Physical Therapy

## 2023-10-25 ENCOUNTER — Encounter: Payer: Self-pay | Admitting: Family Medicine

## 2023-10-25 DIAGNOSIS — R29818 Other symptoms and signs involving the nervous system: Secondary | ICD-10-CM | POA: Insufficient documentation

## 2023-10-25 DIAGNOSIS — R2681 Unsteadiness on feet: Secondary | ICD-10-CM | POA: Diagnosis not present

## 2023-10-25 DIAGNOSIS — R42 Dizziness and giddiness: Secondary | ICD-10-CM | POA: Diagnosis not present

## 2023-10-25 DIAGNOSIS — R279 Unspecified lack of coordination: Secondary | ICD-10-CM | POA: Diagnosis not present

## 2023-10-25 MED ORDER — ROSUVASTATIN CALCIUM 10 MG PO TABS: 10.0000 mg | ORAL_TABLET | Freq: Every day | ORAL | 3 refills | Status: DC

## 2023-10-25 NOTE — Therapy (Signed)
 OUTPATIENT PHYSICAL THERAPY TREATMENT  Patient Name: Jack Norris MRN: [id number] DOB:February 12, 1941, 83 y.o., male Today's Date: 10/25/2023  END OF SESSION:  PT End of Session - 10/25/23 0909     Visit Number 5    Number of Visits 12    Date for PT Re-Evaluation 11/22/23    Authorization Type [employer]    Progress Note Due on Visit 10    PT Start Time 0915    PT Stop Time 0955    PT Time Calculation (min) 40 min    Activity Tolerance Patient tolerated treatment well             Past Medical History:  Diagnosis Date   Asthma, mild    as child-only now if has URI   BPH (benign prostatic hypertrophy)    BPPV (benign paroxysmal positional vertigo)    Diverticulosis    Eczema    Fatty liver disease, nonalcoholic    Frequent nosebleeds    History of   GERD (gastroesophageal reflux disease)    occ uses TUMS as needed   Glaucoma    History of bronchitis    History of colon polyps    History of diverticulitis    History of kidney stones    Hyperlipemia    Hypertension    Influenza A 11/2017   Memory changes    OA (osteoarthritis)    Obesity    Overactive bladder 2020   PVCs (premature ventricular contractions)    Renal calculi right    AND LEFT X1 NON-OBSTRUCTIVE   Right sided sciatica    Bilateral buttock and thighs   Simple renal cyst right interpoler, simpler in nature , asymptomatic  per urologist note (dr dahlstedt)  12-26-2011   BENIGN, has been drained   Testosterone deficiency    Wears glasses    Wears hearing aid    Past Surgical History:  Procedure Laterality Date   APPENDECTOMY     bone spur removal     BILATERAL GREAT TOE   COLONOSCOPY     CYSTO/ RIGHT RETROGRADE PYELOGRAM/ RIGHT URETER DILATATION/ RIGHT URETEROSCOPY/ LASER LITHOTRIPSY RIGHT RENAL PELVIS AND LOWER POLE CALCULI/ PLACEMENT STENT  01-11-2012  DR DAHLSTEDT   RIGHT RENAL PELVIS AND LOWER POLE CALCULI   CYSTOSCOPY W/ RETROGRADES  06/02/2012   Procedure: CYSTOSCOPY WITH  RETROGRADE PYELOGRAM;  Surgeon: Marcine Matar, MD;  Location: [facility];  Service: Urology;  Laterality: Right;   CYSTOSCOPY WITH URETEROSCOPY  06/02/2012   Procedure: CYSTOSCOPY WITH URETEROSCOPY;  Surgeon: Marcine Matar, MD;  Location: [facility];  Service: Urology;  Laterality: Right;  90 MIN ALSO EXTRACTION OF CALCULI    EYE SURGERY Bilateral 2020   LITHOTRIPSY  APPROX.  1998   ESWL  left kidney x 2   ROTATOR CUFF REPAIR  2012   LEFT SHOULDER   SHOULDER ARTHROSCOPY WITH ROTATOR CUFF REPAIR AND SUBACROMIAL DECOMPRESSION  09/12/2012   Procedure: SHOULDER ARTHROSCOPY WITH ROTATOR CUFF REPAIR AND SUBACROMIAL DECOMPRESSION;  Surgeon: Mable Paris, MD;  Location: [facility];  Service: Orthopedics;  Laterality: Right;  see above and biceps tendonotomy   SKIN LESION EXCISION Right 07/07/2021   right side of face near right ear   TRANSURETHRAL RESECTION OF BLADDER NECK N/A 04/08/2018   Procedure: TRANSURETHRAL RESECTION OF BLADDER NECK CONTRACTURE;  Surgeon: Marcine Matar, MD;  Location: [facility];  Service: Urology;  Laterality: N/A;   TRANSURETHRAL RESECTION OF PROSTATE  11/29/2009   BPH  VASECTOMY  1970'S   GEN. ANES.   Patient Active Problem List   Diagnosis Date Noted   Other fatigue 09/17/2023   Branch retinal artery occlusion of left eye 10/08/2022   Intermediate stage nonexudative age-related macular degeneration of left eye 04/07/2022   Aortic atherosclerosis (HCC) 01/07/2022   OSA (obstructive sleep apnea) 04/29/2021   CAD (coronary artery disease) 02/27/2021   Left epiretinal membrane 03/21/2020   Right epiretinal membrane 03/21/2020   Posterior vitreous detachment of right eye 03/21/2020   Posterior capsular opacification, right 03/21/2020   Bilateral hearing loss 07/04/2019   Mild cognitive impairment with memory loss 06/16/2019   Pulmonary nodules 12/20/2018   Metabolic syndrome  04/16/2016   Hearing loss of both ears 04/16/2016   Fatty liver disease, nonalcoholic 05/28/2015   Abdominal pain, left lower quadrant 12/06/2012   Vertigo 02/12/2012   Nephrolithiasis 03/27/2011   HYPERCHOLESTEROLEMIA 12/09/2007   Testosterone deficiency 09/21/2007   Benign essential hypertension 04/07/2007   Class 1 obesity due to excess calories with serious comorbidity and body mass index (BMI) of 33.0 to 33.9 in adult 02/16/2007   Extrinsic asthma 02/16/2007   GERD 02/16/2007   Osteoarthritis 02/16/2007    PCP: Excell Seltzer, MD REFERRING PROVIDER: Excell Seltzer, MD   REFERRING DIAG: R42 (ICD-10-CM) - Vertigo   THERAPY DIAG:  Dizziness and giddiness  Unsteadiness on feet  Unspecified lack of coordination  Other symptoms and signs involving the nervous system  ONSET DATE: 2-3 years  Rationale for Evaluation and Treatment: Rehabilitation  SUBJECTIVE:   SUBJECTIVE STATEMENT: Pt states he woke up at 2 in the morning on Wednesday night and had a full blown vertigo episode. Pt reports he was feeling real whoozy this morning and did take his meclizine. Has had some lower back issues that flared up -- took some tylenol for this. Has been able to do his exercises. Pt feels that looking down is worse than coming up. Has been trying to look first and then move his head.   From eval: Pt states his vertigo is not new. Had vertigo sessions for 2-3 years. It occurs once every 6 months. Comes on semi quickly and he can tell when it's coming on. Gets nauseous and reports throwing up. Will be able to sleep it off and may feel washed out for a day or so. Pt states he feels that it changed this year. Pt reports he was looking down at his phone and had his vertigo (felt this onset was quicker and more severe). Has had 7 episodes in the past week. Has become more frequent. Pt reports balance issues for some time. Carries meclizine with him. Now is currently taking Vitamin D as well. Has not  seen the spinning but more "excess movement"  Pt accompanied by: self  PERTINENT HISTORY: Hearing loss R worse than L  PAIN:  Are you having pain? No  PRECAUTIONS: Fall  WEIGHT BEARING RESTRICTIONS: No  FALLS: Has patient fallen in last 6 months? Yes. Number of falls 1 -- 3.5 weeks ago fell from dizziness (bumped into bathroom door and slid down)  PATIENT GOALS: Improve dizziness and balance  OBJECTIVE:  Note: Objective measures were completed at Evaluation unless otherwise noted.  Cervical ROM: Age related stiffness but no pain  Active A/PROM (deg) eval  Flexion 100%  Extension 50%  Right lateral flexion   Left lateral flexion   Right rotation 80%  Left rotation 80%  (Blank rows = not tested)  FUNCTIONAL TESTS:  FGA: 23/30 MCTSIB: 26 sec condition 1; 24 sec condition 2; 30 sec condition 3; 30 sec with increased sway condition 4   PATIENT SURVEYS:  DHI Total DHI Score: 14 / 100 = 14 % (P: 4 / 24 E: 4 / 36 F: 6 / 40)  VESTIBULAR ASSESSMENT:  GENERAL OBSERVATION: forward head   SYMPTOM BEHAVIOR:  Subjective history: R ear hearing loss vs L ear; shot in L eye for clot (L eye is fuzzy s/p epiretinal peel), cataract lenses and stigmatism in both eyes, double vision in R eye (uses prism on R lens)  Non-Vestibular symptoms: nausea/vomiting  Type of dizziness: Imbalance (Disequilibrium), Spinning/Vertigo, and Lightheadedness/Faint  Frequency: 7 episodes within the past 5 weeks  Duration: an hour  Aggravating factors: Induced by motion: activity in general and sporadically; bending down and coming up  Relieving factors: lying supine, rest, and sleep  Progression of symptoms: worse  VESTIBULAR - OCULAR REFLEX:   Slow VOR: Positive Bilaterally  VOR Cancellation: Corrective Saccades with L head turn  Head-Impulse Test: HIT Right: negative HIT Left: positive with pt reporting some mild symptoms   POSITIONAL TESTING:  Dix-hallpike (-) L&R in modified supine position and  sidelying position Sidelying test (-) L&R Roll test (-) L&R  Mildly symptomatic when coming from R sidelying vs L sidelying                                                                                   TREATMENT DATE:  10/25/23 Rechecked dix-hallpike and roll test  Supine  Head turned R & then L: Smooth pursuit horizontal and then vertical 2x30" for habituation  VOR x1 2x30" head nods and then head turns (increases dizziness)  Head nods and head turns with eyes closed (no dizziness)  Corrective saccades 2x30" head nods and head turns Standing feet together on airex  Vestibular ball x30" CW & CCW     PATIENT EDUCATION: Education details: HEP updates/modifications Person educated: Patient and Spouse Education method: Explanation, Demonstration, and Handouts Education comprehension: verbalized understanding, returned demonstration, and needs further education  HOME EXERCISE PROGRAM: Access Code: [email or web address] URL: [email or web address] Date: 10/20/2023 Prepared by: Vernon Prey April Kirstie Peri  Exercises - Standing Feet Together Smooth Pursuit  - 2 x daily - 7 x weekly - 2 sets - 30 sec hold - Standing Gaze Stabilization with Head Rotation  - 2 x daily - 7 x weekly - 2 sets - 30 sec hold - Standing Gaze Stabilization with Head Nod  - 2 x daily - 7 x weekly - 2 sets - 30 sec hold - Narrow Stance with Eyes Closed and Head Nods  - 2 x daily - 7 x weekly - 2 sets - 30 sec hold - Narrow Stance with Eyes Closed and Head Rotation  - 2 x daily - 7 x weekly - 2 sets - 30 sec hold - Standing Gaze Stabilization with Two Near Targets and Head Rotation  - 1 x daily - 7 x weekly - 2 sets - 30 sec hold - Standing Gaze Stabilization with Two Near Targets and Head Nod  - 1 x daily - 7 x weekly - 2 sets - 30 sec  hold - Gaze Stability (VOR) x 2 Feet Together With Vertical Head Turns  - 1 x daily - 7 x weekly - 2 sets - 30 sec hold - Gaze Stability (VOR) x 2 Feet Together With Horizontal  Head Turns  - 1 x daily - 7 x weekly - 2 sets - 30 sec hold  GOALS: Goals reviewed with patient? Yes  SHORT TERM GOALS: Target date: 11/01/2023   Pt will be ind with initial HEP Baseline: Goal status: INITIAL  2.  Pt will report >/=50% improvement in his dizziness Baseline:  Goal status: INITIAL    LONG TERM GOALS: Target date: 11/22/2023   Pt will be ind with management and progression of his HEP Baseline:  Goal status: INITIAL  2.  Pt will have improved FGA by >/=6 points to demo MDC Baseline:  Goal status: INITIAL  3.  Pt will have improved DHI to </=4% to demo MCID Baseline:  Goal status: INITIAL  4.  Pt will report >/=75% improvement in dizzy symptoms Baseline:  Goal status: INITIAL    ASSESSMENT:  CLINICAL IMPRESSION: Session concentrated primarily on gaze stability in supine. Pt reports more dizziness/"pulsing" of the room with head turned to the L in supine but no overt nystagmus seen and no symptoms of pulsing at all when one eye is closed. Greater difficulty performing smooth pursuits with his head turned L vs R. Discussed following up with his eye doctor. Increased dizziness with VOR x1 in supine, no dizziness when his eyes are closed with head rotations. Less dizziness when performing corrective saccades. Still endorses difficulty with bending and then looking up so added vestibular ball exercise on airex today to further work on balance as well as improve vestibular integration.   From eval: Patient is a 83 y.o. M who was seen today for physical therapy evaluation and treatment for dizziness. PMH significant for eye conditions at baseline along with R > L hearing loss. Dizziness has been ongoing in the past 2-3 years but has had a recent exacerbation with increased frequency and intensity of his symptoms. Assessment was (-) for BPPV today; however, pt is demonstrating s/s most consistent with likely L vestibular hypofunction following a likely BPPV episode. Pt  will benefit from PT to improve VOR function and balance for safer home and community mobility.  OBJECTIVE IMPAIRMENTS: Abnormal gait, decreased activity tolerance, decreased balance, decreased endurance, decreased mobility, decreased ROM, dizziness, improper body mechanics, and postural dysfunction.     PLAN:  PT FREQUENCY: 2x/week  PT DURATION: 6 weeks  PLANNED INTERVENTIONS: 97164- PT Re-evaluation, 97110-Therapeutic exercises, 97530- Therapeutic activity, [id number]- Neuromuscular re-education, 97535- Self Care, 16109- Manual therapy, [phone]- Gait training, [phone]- Canalith repositioning, Patient/Family education, Balance training, Stair training, Vestibular training, Cryotherapy, and Moist heat  PLAN FOR NEXT SESSION: Progress vestibular exercises and balance exercises.    Chae Oommen April Ma L Axie Hayne, PT 10/25/2023, 9:09 AM

## 2023-10-27 ENCOUNTER — Encounter: Payer: Self-pay | Admitting: Family Medicine

## 2023-10-27 ENCOUNTER — Other Ambulatory Visit: Payer: Self-pay | Admitting: Family Medicine

## 2023-10-27 DIAGNOSIS — I779 Disorder of arteries and arterioles, unspecified: Secondary | ICD-10-CM

## 2023-10-27 DIAGNOSIS — R42 Dizziness and giddiness: Secondary | ICD-10-CM

## 2023-10-27 MED ORDER — MECLIZINE HCL 25 MG PO TABS: 25.0000 mg | ORAL_TABLET | Freq: Three times a day (TID) | ORAL | 0 refills | Status: AC | PRN

## 2023-10-28 ENCOUNTER — Ambulatory Visit: Payer: Medicare HMO | Admitting: Physical Therapy

## 2023-10-28 ENCOUNTER — Encounter: Payer: Self-pay | Admitting: Physical Therapy

## 2023-10-28 VITALS — BP 122/64 | HR 94

## 2023-10-28 DIAGNOSIS — R279 Unspecified lack of coordination: Secondary | ICD-10-CM | POA: Diagnosis not present

## 2023-10-28 DIAGNOSIS — R2681 Unsteadiness on feet: Secondary | ICD-10-CM | POA: Diagnosis not present

## 2023-10-28 DIAGNOSIS — R42 Dizziness and giddiness: Secondary | ICD-10-CM | POA: Diagnosis not present

## 2023-10-28 DIAGNOSIS — R29818 Other symptoms and signs involving the nervous system: Secondary | ICD-10-CM | POA: Diagnosis not present

## 2023-10-28 NOTE — Therapy (Signed)
 OUTPATIENT PHYSICAL THERAPY TREATMENT  Patient Name: DAXTER PAULE MRN: [id number] DOB:[birth year], 83 y.o., male Today's Date: 10/28/2023  END OF SESSION:  PT End of Session - 10/28/23 0929     Visit Number 6    Number of Visits 12    Date for PT Re-Evaluation 11/22/23    Authorization Type [employer]    Progress Note Due on Visit 10    PT Start Time 0927    PT Stop Time 1013    PT Time Calculation (min) 46 min    Activity Tolerance Patient tolerated treatment well    Behavior During Therapy WFL for tasks assessed/performed             Past Medical History:  Diagnosis Date   Asthma, mild    as child-only now if has URI   BPH (benign prostatic hypertrophy)    BPPV (benign paroxysmal positional vertigo)    Diverticulosis    Eczema    Fatty liver disease, nonalcoholic    Frequent nosebleeds    History of   GERD (gastroesophageal reflux disease)    occ uses TUMS as needed   Glaucoma    History of bronchitis    History of colon polyps    History of diverticulitis    History of kidney stones    Hyperlipemia    Hypertension    Influenza A 11/2017   Memory changes    OA (osteoarthritis)    Obesity    Overactive bladder 2020   PVCs (premature ventricular contractions)    Renal calculi right    AND LEFT X1 NON-OBSTRUCTIVE   Right sided sciatica    Bilateral buttock and thighs   Simple renal cyst right interpoler, simpler in nature , asymptomatic  per urologist note (dr dahlstedt)  12-26-2011   BENIGN, has been drained   Testosterone deficiency    Wears glasses    Wears hearing aid    Past Surgical History:  Procedure Laterality Date   APPENDECTOMY     bone spur removal     BILATERAL GREAT TOE   COLONOSCOPY     CYSTO/ RIGHT RETROGRADE PYELOGRAM/ RIGHT URETER DILATATION/ RIGHT URETEROSCOPY/ LASER LITHOTRIPSY RIGHT RENAL PELVIS AND LOWER POLE CALCULI/ PLACEMENT STENT  01-11-2012  DR DAHLSTEDT   RIGHT RENAL PELVIS AND LOWER POLE CALCULI   CYSTOSCOPY W/  RETROGRADES  06/02/2012   Procedure: CYSTOSCOPY WITH RETROGRADE PYELOGRAM;  Surgeon: Marcine Matar, MD;  Location: [facility];  Service: Urology;  Laterality: Right;   CYSTOSCOPY WITH URETEROSCOPY  06/02/2012   Procedure: CYSTOSCOPY WITH URETEROSCOPY;  Surgeon: Marcine Matar, MD;  Location: [facility];  Service: Urology;  Laterality: Right;  90 MIN ALSO EXTRACTION OF CALCULI    EYE SURGERY Bilateral 2020   LITHOTRIPSY  APPROX.  1998   ESWL  left kidney x 2   ROTATOR CUFF REPAIR  2012   LEFT SHOULDER   SHOULDER ARTHROSCOPY WITH ROTATOR CUFF REPAIR AND SUBACROMIAL DECOMPRESSION  09/12/2012   Procedure: SHOULDER ARTHROSCOPY WITH ROTATOR CUFF REPAIR AND SUBACROMIAL DECOMPRESSION;  Surgeon: Mable Paris, MD;  Location: [facility];  Service: Orthopedics;  Laterality: Right;  see above and biceps tendonotomy   SKIN LESION EXCISION Right 07/07/2021   right side of face near right ear   TRANSURETHRAL RESECTION OF BLADDER NECK N/A 04/08/2018   Procedure: TRANSURETHRAL RESECTION OF BLADDER NECK CONTRACTURE;  Surgeon: Marcine Matar, MD;  Location: [facility];  Service: Urology;  Laterality: N/A;  TRANSURETHRAL RESECTION OF PROSTATE  11/29/2009   BPH   VASECTOMY  1970'S   GEN. ANES.   Patient Active Problem List   Diagnosis Date Noted   Other fatigue 09/17/2023   Branch retinal artery occlusion of left eye 10/08/2022   Intermediate stage nonexudative age-related macular degeneration of left eye 04/07/2022   Aortic atherosclerosis (HCC) 01/07/2022   OSA (obstructive sleep apnea) 04/29/2021   CAD (coronary artery disease) 02/27/2021   Left epiretinal membrane 03/21/2020   Right epiretinal membrane 03/21/2020   Posterior vitreous detachment of right eye 03/21/2020   Posterior capsular opacification, right 03/21/2020   Bilateral hearing loss 07/04/2019   Mild cognitive impairment with memory loss 06/16/2019    Pulmonary nodules 12/20/2018   Metabolic syndrome 04/16/2016   Hearing loss of both ears 04/16/2016   Fatty liver disease, nonalcoholic 05/28/2015   Abdominal pain, left lower quadrant 12/06/2012   Vertigo 02/12/2012   Nephrolithiasis 03/27/2011   HYPERCHOLESTEROLEMIA 12/09/2007   Testosterone deficiency 09/21/2007   Benign essential hypertension 04/07/2007   Class 1 obesity due to excess calories with serious comorbidity and body mass index (BMI) of 33.0 to 33.9 in adult 02/16/2007   Extrinsic asthma 02/16/2007   GERD 02/16/2007   Osteoarthritis 02/16/2007    PCP: Excell Seltzer, MD REFERRING PROVIDER: Excell Seltzer, MD   REFERRING DIAG: R42 (ICD-10-CM) - Vertigo   THERAPY DIAG:  Dizziness and giddiness  Unsteadiness on feet  ONSET DATE: 2-3 years  Rationale for Evaluation and Treatment: Rehabilitation  SUBJECTIVE:   SUBJECTIVE STATEMENT: Had an episode of vertigo on Tuesday night. And in between he is always a little dizzy. When he has the episodes, will get the spinning vertigo. Pt reports he feels like he has a condition of anisocoria, has seen an ophthalmologist and is planning on getting a new one as he has mentioned to this them before. Last time he saw them was about 6 months ago. Per PCP, "they reviewed the CT head/angio, I agree there looks like there may be some concern about a vertebral artery cause of your vertigo". Going to be referred to a vascular doctor for further recommendations. Will see Dr. Terrace Arabia at the beginning of April. When he walks around this morning, feels like a mild dizziness. Can't tell if any of the exercises are working. Wants to cancel all the rest of his appts until he sees the vascular doctor and figures out what's going on. Did not take his Meclizine this morning. Does not know any certain movements that cause his dizziness.   From eval: Pt states his vertigo is not new. Had vertigo sessions for 2-3 years. It occurs once every 6 months. Comes on  semi quickly and he can tell when it's coming on. Gets nauseous and reports throwing up. Will be able to sleep it off and may feel washed out for a day or so. Pt states he feels that it changed this year. Pt reports he was looking down at his phone and had his vertigo (felt this onset was quicker and more severe). Has had 7 episodes in the past week. Has become more frequent. Pt reports balance issues for some time. Carries meclizine with him. Now is currently taking Vitamin D as well. Has not seen the spinning but more "excess movement"  Pt accompanied by: self  PERTINENT HISTORY: Hearing loss R worse than L  PAIN:  Are you having pain? No  PRECAUTIONS: Fall  WEIGHT BEARING RESTRICTIONS: No  FALLS: Has patient fallen  in last 6 months? Yes. Number of falls 1 -- 3.5 weeks ago fell from dizziness (bumped into bathroom door and slid down)  PATIENT GOALS: Improve dizziness and balance  OBJECTIVE:  Note: Objective measures were completed at Evaluation unless otherwise noted.  Cervical ROM: Age related stiffness but no pain  Active A/PROM (deg) eval  Flexion 100%  Extension 50%  Right lateral flexion   Left lateral flexion   Right rotation 80%  Left rotation 80%  (Blank rows = not tested)  FUNCTIONAL TESTS:  FGA: 23/30 MCTSIB: 26 sec condition 1; 24 sec condition 2; 30 sec condition 3; 30 sec with increased sway condition 4   PATIENT SURVEYS:  DHI Total DHI Score: 14 / 100 = 14 % (P: 4 / 24 E: 4 / 36 F: 6 / 40)  VESTIBULAR ASSESSMENT:  GENERAL OBSERVATION: forward head   SYMPTOM BEHAVIOR:  Subjective history: R ear hearing loss vs L ear; shot in L eye for clot (L eye is fuzzy s/p epiretinal peel), cataract lenses and stigmatism in both eyes, double vision in R eye (uses prism on R lens)  Non-Vestibular symptoms: nausea/vomiting  Type of dizziness: Imbalance (Disequilibrium), Spinning/Vertigo, and Lightheadedness/Faint  Frequency: 7 episodes within the past 5 weeks  Duration:  an hour  Aggravating factors: Induced by motion: activity in general and sporadically; bending down and coming up  Relieving factors: lying supine, rest, and sleep  Progression of symptoms: worse  VESTIBULAR - OCULAR REFLEX:   Slow VOR: Positive Bilaterally  VOR Cancellation: Corrective Saccades with L head turn  Head-Impulse Test: HIT Right: negative HIT Left: positive with pt reporting some mild symptoms   POSITIONAL TESTING:  Dix-hallpike (-) L&R in modified supine position and sidelying position Sidelying test (-) L&R Roll test (-) L&R  Mildly symptomatic when coming from R sidelying vs L sidelying                                                                                   TREATMENT DATE:  10/28/23:  Therapeutic Activity:  Pt reporting that he has not noticed a difference in his dizziness since starting PT and wants today to be his last visit at this time until he follows up more with the vascular doctor and with ophthalmology. Pt goes through his hx of dizziness with this PT. Pt wishes to cancel the remainder of his appts at this time. Pt would not like to be officially discharged and wants to leave his chart open just in case he gets follow-up within the 30 days. Discussed importance of still continuing to perform HEP at home during this time.   Vitals:   10/28/23 0940 10/28/23 0941  BP: 120/72 122/64  Pulse: 87 94   Sitting, Standing, no lightheadedness noted or unsteadiness   R and L sidelying test: no dizziness in position or when coming upright   Standing Hart Chart in horizontal direction: 5 lines, no symptoms, accurate speed  On air ex: with feet apart > more narrow BOS EC 2 x 30 seconds, pt more off when feet are closer together, needing intermittent taps to wall or chair for balance, educated on vestibular input for balance and  also added this to pt's HEP as pt currently not performing any exercises at home on a compliant surface   Access Code: [email or web address] URL:  [email or web address] Date: 10/28/2023 Prepared by: Sherlie Ban  Reviewed HEP for oculomotor/vestibular deficits given to pt by primary PT:   Exercises - Standing Feet Together Smooth Pursuit  - 2 x daily - 7 x weekly - 2 sets - 30 sec hold - Standing Gaze Stabilization with Head Rotation  - 2 x daily - 7 x weekly - 2 sets - 30 sec hold - pt reporting mild dizziness  - Standing Gaze Stabilization with Head Nod  - 2 x daily - 7 x weekly - 2 sets - 30 sec hold - pt reporting mild dizziness  - Narrow Stance with Eyes Closed and Head Nods  - 2 x daily - 7 x weekly - 2 sets - 30 sec hold - Narrow Stance with Eyes Closed and Head Rotation  - 2 x daily - 7 x weekly - 2 sets - 30 sec hold - Standing Gaze Stabilization with Two Near Targets and Head Rotation  - 1 x daily - 7 x weekly - 2 sets - 30 sec hold - Standing Gaze Stabilization with Two Near Targets and Head Nod  - 1 x daily - 7 x weekly - 2 sets - 30 sec hold - Gaze Stability (VOR) x 2 Feet Together With Vertical Head Turns  - 1 x daily - 7 x weekly - 2 sets - 30 sec hold - needing initial cues for technique/coordination  - Gaze Stability (VOR) x 2 Feet Together With Horizontal Head Turns  - 1 x daily - 7 x weekly - 2 sets - 30 sec hold - needing initial cues for technique/coordination  - Standing Balance with Eyes Closed on Foam  - 1 x daily - 5 x weekly - 3 sets - 30 hold - new addition on 10/28/23 for vestibular input    PATIENT EDUCATION: Education details: See above, pt wants this to be his last PT visit at this time, reviewed HEP given to pt and added EC on a compliant surface for vestibular input  Person educated: Patient and Spouse Education method: Explanation, Demonstration, and Handouts Education comprehension: verbalized understanding, returned demonstration, and needs further education  HOME EXERCISE PROGRAM: Access Code: [email or web address] URL: [email or web address] Date: 10/20/2023 Prepared by: Vernon Prey  April Kirstie Peri  Exercises - Standing Feet Together Smooth Pursuit  - 2 x daily - 7 x weekly - 2 sets - 30 sec hold - Standing Gaze Stabilization with Head Rotation  - 2 x daily - 7 x weekly - 2 sets - 30 sec hold - Standing Gaze Stabilization with Head Nod  - 2 x daily - 7 x weekly - 2 sets - 30 sec hold - Narrow Stance with Eyes Closed and Head Nods  - 2 x daily - 7 x weekly - 2 sets - 30 sec hold - Narrow Stance with Eyes Closed and Head Rotation  - 2 x daily - 7 x weekly - 2 sets - 30 sec hold - Standing Gaze Stabilization with Two Near Targets and Head Rotation  - 1 x daily - 7 x weekly - 2 sets - 30 sec hold - Standing Gaze Stabilization with Two Near Targets and Head Nod  - 1 x daily - 7 x weekly - 2 sets - 30 sec hold - Gaze Stability (VOR) x 2 Feet Together With Vertical Head Turns  - 1 x daily -  7 x weekly - 2 sets - 30 sec hold - Gaze Stability (VOR) x 2 Feet Together With Horizontal Head Turns  - 1 x daily - 7 x weekly - 2 sets - 30 sec hold  GOALS: Goals reviewed with patient? Yes  SHORT TERM GOALS: Target date: 11/01/2023   Pt will be ind with initial HEP Baseline: Goal status: INITIAL  2.  Pt will report >/=50% improvement in his dizziness Baseline:  Goal status: INITIAL    LONG TERM GOALS: Target date: 11/22/2023   Pt will be ind with management and progression of his HEP Baseline:  Goal status: INITIAL  2.  Pt will have improved FGA by >/=6 points to demo MDC Baseline:  Goal status: INITIAL  3.  Pt will have improved DHI to </=4% to demo MCID Baseline:  Goal status: INITIAL  4.  Pt will report >/=75% improvement in dizzy symptoms Baseline:  Goal status: INITIAL    ASSESSMENT:  CLINICAL IMPRESSION: Pt got results back from his CT and per his PCP "there may be some concern about a vertebral artery cause of your vertigo". Pt getting referred to vascular MD for further work-up and pt would like today to be his last scheduled PT session at this time  and wants to cancel all further appts. Pt would still like to keep his chart open and not be formally discharged just in case he needs to return within the next month. Pt also plans to follow-up with ophthalmology due to visual deficits as well. Remainder of session focused on reviewing pt's HEP given by primary PT for vestibular/oculomotor deficits and encouraged pt to continue to perform. Pt with no further questions at this time.   From eval: Patient is a 83 y.o. M who was seen today for physical therapy evaluation and treatment for dizziness. PMH significant for eye conditions at baseline along with R > L hearing loss. Dizziness has been ongoing in the past 2-3 years but has had a recent exacerbation with increased frequency and intensity of his symptoms. Assessment was (-) for BPPV today; however, pt is demonstrating s/s most consistent with likely L vestibular hypofunction following a likely BPPV episode. Pt will benefit from PT to improve VOR function and balance for safer home and community mobility.  OBJECTIVE IMPAIRMENTS: Abnormal gait, decreased activity tolerance, decreased balance, decreased endurance, decreased mobility, decreased ROM, dizziness, improper body mechanics, and postural dysfunction.    PLAN:  PT FREQUENCY: 2x/week  PT DURATION: 6 weeks  PLANNED INTERVENTIONS: 97164- PT Re-evaluation, 97110-Therapeutic exercises, 97530- Therapeutic activity, 97112- Neuromuscular re-education, 97535- Self Care, 16109- Manual therapy, [phone]- Gait training, [phone]- Canalith repositioning, Patient/Family education, Balance training, Stair training, Vestibular training, Cryotherapy, and Moist heat  PLAN FOR NEXT SESSION: holding pt's chart open for the next month while he gets further medical follow up    Drake Leach, PT, DPT 10/28/2023, 10:18 AM

## 2023-11-01 ENCOUNTER — Ambulatory Visit: Payer: Medicare HMO | Admitting: Physical Therapy

## 2023-11-02 DIAGNOSIS — H43811 Vitreous degeneration, right eye: Secondary | ICD-10-CM | POA: Diagnosis not present

## 2023-11-02 DIAGNOSIS — H353122 Nonexudative age-related macular degeneration, left eye, intermediate dry stage: Secondary | ICD-10-CM | POA: Diagnosis not present

## 2023-11-02 DIAGNOSIS — H35373 Puckering of macula, bilateral: Secondary | ICD-10-CM | POA: Diagnosis not present

## 2023-11-02 DIAGNOSIS — H34232 Retinal artery branch occlusion, left eye: Secondary | ICD-10-CM | POA: Diagnosis not present

## 2023-11-02 DIAGNOSIS — H34832 Tributary (branch) retinal vein occlusion, left eye, with macular edema: Secondary | ICD-10-CM | POA: Diagnosis not present

## 2023-11-03 ENCOUNTER — Encounter: Payer: Medicare HMO | Admitting: Physical Therapy

## 2023-11-04 NOTE — Telephone Encounter (Signed)
 It is on the schedule.  I can see it for Dept AV[facility] 11/08/23 at 10: 15 am with Dr. Gilda Crease.

## 2023-11-07 DIAGNOSIS — I6529 Occlusion and stenosis of unspecified carotid artery: Secondary | ICD-10-CM | POA: Insufficient documentation

## 2023-11-07 DIAGNOSIS — I669 Occlusion and stenosis of unspecified cerebral artery: Secondary | ICD-10-CM | POA: Insufficient documentation

## 2023-11-07 DIAGNOSIS — I739 Peripheral vascular disease, unspecified: Secondary | ICD-10-CM | POA: Insufficient documentation

## 2023-11-07 NOTE — Progress Notes (Unsigned)
 MRN : [id number]  Jack Norris is a 83 y.o. (November 21, 1940) male who presents with chief complaint of check carotid arteries.  History of Present Illness:   The patient is seen for evaluation of mild cervical carotid stenosis in association with intracranial stenosis of the posterior circulation as well as some abnormalities of the right vertebral artery. The atherosclerotic stenosis was identified after CT angiogram of the neck and brain was obtained secondary to the acute onset of vertigo and visual changes.  The patient denies amaurosis fugax but he is having significant visual field issues.  He is also describing the abrupt onset of vertigo which is made worse with looking up.  He does describe the room spinning.  It is severe enough and happening on a frequent enough basis that he has stopped driving for fear of having an MVA.  There is no recent history of TIA symptoms or focal motor deficits. There is no prior documented CVA.  There is no history of migraine headaches. There is no history of seizures.  The patient is taking enteric-coated aspirin 325 mg daily.  No recent shortening of the patient's walking distance or new symptoms consistent with claudication.  No history of rest pain symptoms. No new ulcers or wounds of the lower extremities have occurred.  There is no history of DVT, PE or superficial thrombophlebitis. No recent episodes of angina or shortness of breath documented.   CTA done at [facility] on 10/22/2023: CTA NECK:   Aortic arch: No significant stenosis (50% or greater) of the visualized origins of the major branch vessels.   Left carotid system: Predominantly noncalcified atherosclerotic plaque at the carotid bifurcation and along the proximal ICA does not result in a greater than 50% stenosis.   Right carotid system: Predominantly noncalcified atherosclerotic plaque at the carotid bifurcation and along the proximal ICA does not result in a  greater than 50% stenosis.   Vertebral arteries: Left dominant. Mixed calcified and noncalcified atherosclerotic plaque at the origin of the right vertebral artery results in at least mild stenosis.   CTA HEAD:  Anterior circulation: No occlusion or significant stenosis (50% or greater). No aneurysm.   Posterior circulation: Mild multifocal stenoses of the P2 segment of the left PCA as annotated series 6 image 408 and 412. Calcified atherosclerotic plaque of the V4 segment of the right vertebral artery with diminished opacification. The right PICA is not well visualized.    No outpatient medications have been marked as taking for the 11/08/23 encounter (Appointment) with Gilda Crease, Latina Craver, MD.    Past Medical History:  Diagnosis Date   Asthma, mild    as child-only now if has URI   BPH (benign prostatic hypertrophy)    BPPV (benign paroxysmal positional vertigo)    Diverticulosis    Eczema    Fatty liver disease, nonalcoholic    Frequent nosebleeds    History of   GERD (gastroesophageal reflux disease)    occ uses TUMS as needed   Glaucoma    History of bronchitis    History of colon polyps    History of diverticulitis    History of kidney stones    Hyperlipemia    Hypertension    Influenza A 11/2017   Memory changes    OA (osteoarthritis)    Obesity    Overactive bladder 2020   PVCs (premature  ventricular contractions)    Renal calculi right    AND LEFT X1 NON-OBSTRUCTIVE   Right sided sciatica    Bilateral buttock and thighs   Simple renal cyst right interpoler, simpler in nature , asymptomatic  per urologist note (dr dahlstedt)  12-26-2011   BENIGN, has been drained   Testosterone deficiency    Wears glasses    Wears hearing aid     Past Surgical History:  Procedure Laterality Date   APPENDECTOMY     bone spur removal     BILATERAL GREAT TOE   COLONOSCOPY     CYSTO/ RIGHT RETROGRADE PYELOGRAM/ RIGHT URETER DILATATION/ RIGHT URETEROSCOPY/ LASER LITHOTRIPSY  RIGHT RENAL PELVIS AND LOWER POLE CALCULI/ PLACEMENT STENT  01-11-2012  DR DAHLSTEDT   RIGHT RENAL PELVIS AND LOWER POLE CALCULI   CYSTOSCOPY W/ RETROGRADES  06/02/2012   Procedure: CYSTOSCOPY WITH RETROGRADE PYELOGRAM;  Surgeon: Marcine Matar, MD;  Location[facility];  Service: Urology;  Laterality: Right;   CYSTOSCOPY WITH URETEROSCOPY  06/02/2012   Procedure: CYSTOSCOPY WITH URETEROSCOPY;  Surgeon: Marcine Matar, MD;  Location: [facility];  Service: Urology;  Laterality: Right;  90 MIN ALSO EXTRACTION OF CALCULI    EYE SURGERY Bilateral 2020   LITHOTRIPSY  APPROX.  1998   ESWL  left kidney x 2   ROTATOR CUFF REPAIR  2012   LEFT SHOULDER   SHOULDER ARTHROSCOPY WITH ROTATOR CUFF REPAIR AND SUBACROMIAL DECOMPRESSION  09/12/2012   Procedure: SHOULDER ARTHROSCOPY WITH ROTATOR CUFF REPAIR AND SUBACROMIAL DECOMPRESSION;  Surgeon: Mable Paris, MD;  Location: [facility];  Service: Orthopedics;  Laterality: Right;  see above and biceps tendonotomy   SKIN LESION EXCISION Right 07/07/2021   right side of face near right ear   TRANSURETHRAL RESECTION OF BLADDER NECK N/A 04/08/2018   Procedure: TRANSURETHRAL RESECTION OF BLADDER NECK CONTRACTURE;  Surgeon: Marcine Matar, MD;  Location: [facility];  Service: Urology;  Laterality: N/A;   TRANSURETHRAL RESECTION OF PROSTATE  11/29/2009   BPH   VASECTOMY  1970'S   GEN. ANES.    Social History Social History   Tobacco Use   Smoking status: Former    Current packs/day: 0.00    Types: Cigarettes    Start date: 01/05/1959    Quit date: 01/05/1967    Years since quitting: 56.8   Smokeless tobacco: Never   Tobacco comments:    QUIT AGE 83  Vaping Use   Vaping status: Never Used  Substance Use Topics   Alcohol use: Yes    Alcohol/week: 0.0 standard drinks of alcohol    Comment: OCCASIONAL   Drug use: No    Family History Family History  Problem Relation  Age of Onset   Cancer Mother        breast and kidney    Squamous cell carcinoma Mother    Cancer Father        lung   Heart disease Father     Allergies  Allergen Reactions   Paroxetine     REACTION: Had mild personality change on this medication and doesn't want to take this again.   Morphine And Codeine     Causes bladder problems   Ciprofloxacin Other (See Comments)    ABDOMINAL PAIN/ sob   Flagyl [Metronidazole Hcl] Other (See Comments)    ABDOMINAL PAIN/ sob     REVIEW OF SYSTEMS (Negative unless checked)  Constitutional: [] Weight loss  [] Fever  [] Chills Cardiac: [] Chest pain   []   Chest pressure   [] Palpitations   [] Shortness of breath when laying flat   [] Shortness of breath with exertion. Vascular:  [x] Pain in legs with walking   [] Pain in legs at rest  [] History of DVT   [] Phlebitis   [] Swelling in legs   [] Varicose veins   [] Non-healing ulcers Pulmonary:   [] Uses home oxygen   [] Productive cough   [] Hemoptysis   [] Wheeze  [] COPD   [] Asthma Neurologic:  [] Dizziness   [] Seizures   [] History of stroke   [] History of TIA  [] Aphasia   [] Vissual changes   [] Weakness or numbness in arm   [] Weakness or numbness in leg Musculoskeletal:   [] Joint swelling   [] Joint pain   [] Low back pain Hematologic:  [] Easy bruising  [] Easy bleeding   [] Hypercoagulable state   [] Anemic Gastrointestinal:  [] Diarrhea   [] Vomiting  [] Gastroesophageal reflux/heartburn   [] Difficulty swallowing. Genitourinary:  [] Chronic kidney disease   [] Difficult urination  [] Frequent urination   [] Blood in urine Skin:  [] Rashes   [] Ulcers  Psychological:  [] History of anxiety   []  History of major depression.  Physical Examination  There were no vitals filed for this visit. There is no height or weight on file to calculate BMI. Gen: WD/WN, NAD Head: [place]/AT, No temporalis wasting.  Ear/Nose/Throat: Hearing grossly intact, nares w/o erythema or drainage Eyes: PER, EOMI, sclera nonicteric.  Neck: Supple, no  masses.  No bruit or JVD.  Pulmonary:  Good air movement, no audible wheezing, no use of accessory muscles.  Cardiac: RRR, normal S1, S2, no Murmurs. Vascular:  carotid bruit noted Vessel Right Left  Radial Palpable Palpable  Carotid  Palpable  Palpable  Gastrointestinal: soft, non-distended. No guarding/no peritoneal signs.  Musculoskeletal: M/S 5/5 throughout.  No visible deformity.  Neurologic: CN 2-12 intact. Pain and light touch intact in extremities.  Symmetrical.  Speech is fluent. Motor exam as listed above. Psychiatric: Judgment intact, Mood & affect appropriate for pt's clinical situation. Dermatologic: No rashes or ulcers noted.  No changes consistent with cellulitis.   CBC Lab Results  Component Value Date   WBC 12.7 (H) 09/17/2023   HGB 14.5 09/17/2023   HCT 42.4 09/17/2023   MCV 82.7 09/17/2023   PLT 235 09/17/2023    BMET    Component Value Date/Time   NA 139 09/17/2023 2056   NA 141 05/03/2019 0926   K 4.1 09/17/2023 2056   CL 106 09/17/2023 2056   CO2 22 09/17/2023 2056   GLUCOSE 117 (H) 09/17/2023 2056   BUN 20 09/17/2023 2056   BUN 16 05/03/2019 0926   CREATININE 1.27 (H) 09/17/2023 2056   CALCIUM 8.9 09/17/2023 2056   GFRNONAA 56 (L) 09/17/2023 2056   GFRAA 58 (L) 05/03/2019 0926   CrCl cannot be calculated (Patient's most recent lab result is older than the maximum 21 days allowed.).  COAG No results found for: "INR", "PROTIME"  Radiology CUP PACEART REMOTE DEVICE CHECK Result Date: 10/21/2023 ILR summary report received. Battery status OK. Normal device function. No new symptom, tachy, brady, or pause episodes. No new AF episodes. Monthly summary reports and ROV/PRN [place], CVRS    Assessment/Plan 1. Bilateral carotid artery stenosis (Primary) Recommend:  Given the patient's asymptomatic subcritical stenosis no further invasive testing or surgery at this time.  CT angio shows less than 50% stenosis bilaterally.  Continue antiplatelet  therapy as prescribed Continue management of CAD, HTN and Hyperlipidemia Healthy heart diet,  encouraged exercise at least 4 times per week  Follow up  in 12 months with duplex ultrasound and physical exam   - VAS US CAROTID; Future  2. Asymptomatic stenosis of intracranial artery Given the patient's complaint of vertigo and his ongoing symptoms I will ask him to be evaluated by neurointerventional since he does have some changes in his posterior circulation.  - Ambulatory referral to Neurosurgery  3. PAD (peripheral artery disease) (HCC) Recommend:  I do not find evidence of life style limiting vascular disease. The patient specifically denies life style limitation.  Previous noninvasive studies including ABI's of the legs do not identify critical vascular problems.  The patient should continue walking and begin a more formal exercise program. The patient should continue his antiplatelet therapy and aggressive treatment of the lipid abnormalities.  The patient is instructed to call the office if there is a significant change in the lower extremity symptoms, particularly if a wound develops or there is an abrupt increase in leg pain.  4. Coronary artery disease involving native coronary artery of native heart without angina pectoris Continue cardiac and antihypertensive medications as already ordered and reviewed, no changes at this time.  Continue statin as ordered and reviewed, no changes at this time  Nitrates PRN for chest pain  5. Benign essential hypertension Continue antihypertensive medications as already ordered, these medications have been reviewed and there are no changes at this time.     Levora Dredge, MD  11/07/2023 12:00 PM

## 2023-11-08 ENCOUNTER — Encounter (INDEPENDENT_AMBULATORY_CARE_PROVIDER_SITE_OTHER): Payer: Self-pay | Admitting: Vascular Surgery

## 2023-11-08 ENCOUNTER — Ambulatory Visit (INDEPENDENT_AMBULATORY_CARE_PROVIDER_SITE_OTHER): Admitting: Vascular Surgery

## 2023-11-08 ENCOUNTER — Encounter: Payer: Medicare HMO | Admitting: Physical Therapy

## 2023-11-08 VITALS — BP 128/70 | HR 84 | Resp 16 | Ht 69.0 in | Wt 233.4 lb

## 2023-11-08 DIAGNOSIS — I6523 Occlusion and stenosis of bilateral carotid arteries: Secondary | ICD-10-CM | POA: Diagnosis not present

## 2023-11-08 DIAGNOSIS — I669 Occlusion and stenosis of unspecified cerebral artery: Secondary | ICD-10-CM | POA: Diagnosis not present

## 2023-11-08 DIAGNOSIS — I739 Peripheral vascular disease, unspecified: Secondary | ICD-10-CM

## 2023-11-08 DIAGNOSIS — I10 Essential (primary) hypertension: Secondary | ICD-10-CM | POA: Diagnosis not present

## 2023-11-08 DIAGNOSIS — I2510 Atherosclerotic heart disease of native coronary artery without angina pectoris: Secondary | ICD-10-CM

## 2023-11-09 ENCOUNTER — Encounter: Payer: Self-pay | Admitting: Family Medicine

## 2023-11-09 ENCOUNTER — Encounter (INDEPENDENT_AMBULATORY_CARE_PROVIDER_SITE_OTHER): Payer: Self-pay | Admitting: Vascular Surgery

## 2023-11-10 ENCOUNTER — Encounter: Payer: Medicare HMO | Admitting: Physical Therapy

## 2023-11-12 ENCOUNTER — Encounter: Payer: Self-pay | Admitting: Neurology

## 2023-11-15 ENCOUNTER — Encounter: Payer: Medicare HMO | Admitting: Physical Therapy

## 2023-11-16 ENCOUNTER — Ambulatory Visit: Payer: Medicare HMO | Admitting: Adult Health

## 2023-11-16 ENCOUNTER — Encounter: Payer: Self-pay | Admitting: Adult Health

## 2023-11-16 VITALS — BP 110/60 | HR 83 | Ht 69.0 in | Wt 231.8 lb

## 2023-11-16 DIAGNOSIS — G4733 Obstructive sleep apnea (adult) (pediatric): Secondary | ICD-10-CM

## 2023-11-16 DIAGNOSIS — E66811 Obesity, class 1: Secondary | ICD-10-CM

## 2023-11-16 DIAGNOSIS — Z6833 Body mass index (BMI) 33.0-33.9, adult: Secondary | ICD-10-CM

## 2023-11-16 DIAGNOSIS — E6609 Other obesity due to excess calories: Secondary | ICD-10-CM

## 2023-11-16 NOTE — Patient Instructions (Signed)
 Continue on CPAP At bedtime, wear all night long.  Avoid falling asleep in recliner if possible.  Work on healthy weight.  Do not drive if sleepy Follow up with Dr. Vassie Loll  or Mathew Storck NP in 1 year and As needed

## 2023-11-16 NOTE — Assessment & Plan Note (Signed)
 Healthy weight loss discussed

## 2023-11-16 NOTE — Assessment & Plan Note (Signed)
 Encouraged on daily usage for more than 6 hours.  Advised to avoid falling asleep without his CPAP in the recliner. Healthy sleep regimen discussed. Plan  Patient Instructions  Continue on CPAP At bedtime, wear all night long.  Avoid falling asleep in recliner if possible.  Work on healthy weight.  Do not drive if sleepy Follow up with Dr. Vassie Loll  or Mallary Kreger NP in 1 year and As needed

## 2023-11-16 NOTE — Progress Notes (Signed)
 @Patient  ID: Jack Norris, male    DOB: [birth year], 83 y.o.   MRN: [id number]  Chief Complaint  Patient presents with   Follow-up    Referring provider: Excell Seltzer, MD  HPI: 83 year old male followed for obstructive sleep apnea and pulmonary nodules (stable on serial CT)  TEST/EVENTS :  07/2018-wt 206 pounds, TST 370 minutes, AHI 5.1/hour lowest desaturation 84%   07/2018 CPAP titration 7 cm 07/2018 CT coronaries-no lung nodules 11/2018 CT chest-decreasing lung nodules 12/2021 CT chest wo con >> stable nodules  11/16/2023 Follow up ; OSA  Presents for a 1 year follow-up for sleep apnea.  Patient is on CPAP at bedtime.  Patient says he wears his CPAP each night.  Typically getting about 5 to 6 hours of sleep.  Complains that he still feels tired throughout the day.  Does nap daily.  CPAP download shows 93% compliance.  Daily average usage at 5 to 5.5 hours.  AHI 1.5/hour.  Minimal leaks.  Patient is on CPAP 7 cm H2O.  Typically falls asleep watching TV in recliner for 2-3 hrs at night then gets up goes to bed, Use Tablet for couple of hours and then falls asleep with CPAP from around Midnight to 5-6 am.  Does not take any sleep aids.  Minimal caffeine intake.  Does not take any sedating medications. Wife says he snores very loud and breathing is erratic when he is sleeping in the recliner.  Has coronary artery disease, hypertension peripheral artery disease   Allergies  Allergen Reactions   Paroxetine     REACTION: Had mild personality change on this medication and doesn't want to take this again.   Morphine And Codeine     Causes bladder problems   Ciprofloxacin Other (See Comments)    ABDOMINAL PAIN/ sob   Flagyl [Metronidazole Hcl] Other (See Comments)    ABDOMINAL PAIN/ sob    Immunization History  Administered Date(s) Administered   Fluad Quad(high Dose 65+) 06/16/2019, 08/02/2020, 08/08/2021, 05/05/2022   Fluad Trivalent(High Dose 65+) 06/17/2023   Influenza  Split 06/26/2011, 07/11/2012   Influenza Whole 05/24/2008, 06/24/2008, 07/04/2009, 05/24/2010   Influenza, High Dose Seasonal PF 06/14/2018   Influenza,inj,Quad PF,6+ Mos 07/26/2013, 06/12/2014, 04/15/2015, 04/16/2016, 07/01/2017   Influenza-Unspecified 08/08/2021   Moderna Covid-19 Fall Seasonal Vaccine 87yrs & older 06/17/2023   PFIZER(Purple Top)SARS-COV-2 Vaccination 10/04/2019, 10/25/2019, 06/07/2020, 03/10/2021   Pneumococcal Conjugate-13 04/15/2015   Pneumococcal Polysaccharide-23 09/14/2007   Td 08/02/2007   Tdap 12/15/2021   Zoster Recombinant(Shingrix) 01/13/2022, 04/01/2022   Zoster, Live 04/08/2009    Past Medical History:  Diagnosis Date   Asthma, mild    as child-only now if has URI   BPH (benign prostatic hypertrophy)    BPPV (benign paroxysmal positional vertigo)    Diverticulosis    Eczema    Fatty liver disease, nonalcoholic    Frequent nosebleeds    History of   GERD (gastroesophageal reflux disease)    occ uses TUMS as needed   Glaucoma    History of bronchitis    History of colon polyps    History of diverticulitis    History of kidney stones    Hyperlipemia    Hypertension    Influenza A 11/2017   Memory changes    OA (osteoarthritis)    Obesity    Overactive bladder 2020   PVCs (premature ventricular contractions)    Renal calculi right    AND LEFT X1 NON-OBSTRUCTIVE   Right sided sciatica  Bilateral buttock and thighs   Simple renal cyst right interpoler, simpler in nature , asymptomatic  per urologist note (dr dahlstedt)  12-26-2011   BENIGN, has been drained   Testosterone deficiency    Wears glasses    Wears hearing aid     Tobacco History: Social History   Tobacco Use  Smoking Status Former   Current packs/day: 0.00   Types: Cigarettes   Start date: 01/05/1959   Quit date: 01/05/1967   Years since quitting: 56.9  Smokeless Tobacco Never  Tobacco Comments   QUIT AGE 22   Counseling given: Not Answered Tobacco comments: QUIT  AGE 22   Outpatient Medications Prior to Visit  Medication Sig Dispense Refill   albuterol (VENTOLIN HFA) 108 (90 Base) MCG/ACT inhaler Inhale 2 puffs into the lungs every 6 (six) hours as needed for wheezing or shortness of breath. 8 g 2   amLODipine (NORVASC) 10 MG tablet TAKE 1 TABLET BY MOUTH EVERY DAY 90 tablet 3   aspirin EC 325 MG tablet Take 325 mg by mouth daily.     B-D 3CC LUER-LOK SYR 18GX1-1/2 18G X 1-1/2" 3 ML MISC USE ONCE A WEEK AS DIRECTED  2   Cholecalciferol (VITAMIN D3) 1.25 MG (50000 UT) CAPS Take 1 capsule (1.25 mg total) by mouth once a week. 12 capsule 0   fenofibrate micronized (LOFIBRA) 67 MG capsule TAKE 1 CAPSULE(67 MG) BY MOUTH DAILY BEFORE BREAKFAST 90 capsule 2   ketorolac (ACULAR) 0.5 % ophthalmic solution Place 1 drop into the left eye every 4 (four) hours.     lisinopril (ZESTRIL) 40 MG tablet TAKE 1 TABLET(40 MG) BY MOUTH DAILY 90 tablet 3   meclizine (ANTIVERT) 25 MG tablet Take 1 tablet (25 mg total) by mouth 3 (three) times daily as needed for dizziness. 30 tablet 0   mometasone (ELOCON) 0.1 % cream Apply 1 Application topically daily.     omeprazole (PRILOSEC) 20 MG capsule Take 20 mg by mouth daily. Per patient OTC     psyllium (METAMUCIL) 58.6 % powder Take 1 packet by mouth daily.     rosuvastatin (CRESTOR) 10 MG tablet Take 1 tablet (10 mg total) by mouth daily. 90 tablet 3   testosterone cypionate (DEPOTESTOSTERONE CYPIONATE) 200 MG/ML injection Inject 0.5 mLs (100 mg total) into the muscle every 7 (seven) days. 10 mL 0   triamcinolone (KENALOG) 0.025 % cream Apply 1 application topically as needed.     vitamin B-12 (CYANOCOBALAMIN) 100 MCG tablet Take 100 mcg by mouth daily.     No facility-administered medications prior to visit.     Review of Systems:   Constitutional:   No  weight loss, night sweats,  Fevers, chills,+ fatigue, or  lassitude.  HEENT:   No headaches,  Difficulty swallowing,  Tooth/dental problems, or  Sore throat,                 No sneezing, itching, ear ache, nasal congestion, post nasal drip,   CV:  No chest pain,  Orthopnea, PND, swelling in lower extremities, anasarca, dizziness, palpitations, syncope.   GI  No heartburn, indigestion, abdominal pain, nausea, vomiting, diarrhea, change in bowel habits, loss of appetite, bloody stools.   Resp: No shortness of breath with exertion or at rest.  No excess mucus, no productive cough,  No non-productive cough,  No coughing up of blood.  No change in color of mucus.  No wheezing.  No chest wall deformity  Skin: no rash or lesions.  GU: no dysuria, change in color of urine, no urgency or frequency.  No flank pain, no hematuria   MS:  No joint pain or swelling.  No decreased range of motion.  No back pain.    Physical Exam  BP 110/60 (BP Location: Left Arm, Patient Position: Sitting, Cuff Size: Large)   Pulse 83   Ht 5\' 9"  (1.753 m)   Wt 231 lb 12.8 oz (105.1 kg)   SpO2 100%   BMI 34.23 kg/m   GEN: A/Ox3; pleasant , NAD, well nourished    HEENT:  [place]/AT,  EACs-clear, TMs-wnl, NOSE-clear, THROAT-clear, no lesions, no postnasal drip or exudate noted.   NECK:  Supple w/ fair ROM; no JVD; normal carotid impulses w/o bruits; no thyromegaly or nodules palpated; no lymphadenopathy. Class 3  MP airway    RESP  Clear  P & A; w/o, wheezes/ rales/ or rhonchi. no accessory muscle use, no dullness to percussion  CARD:  RRR, no m/r/g, no peripheral edema, pulses intact, no cyanosis or clubbing.  GI:   Soft & nt; nml bowel sounds; no organomegaly or masses detected.   Musco: Warm bil, no deformities or joint swelling noted.   Neuro: alert, no focal deficits noted.    Skin: Warm, no lesions or rashes    Lab Results:  CBC   BMET  No results found for: "BNP"  ProBNP No results found for: "PROBNP"  Imaging: CUP PACEART REMOTE DEVICE CHECK Result Date: 10/21/2023 ILR summary report received. Battery status OK. Normal device function. No new symptom,  tachy, brady, or pause episodes. No new AF episodes. Monthly summary reports and ROV/PRN [place], CVRS   Administration History     None           No data to display          No results found for: "NITRICOXIDE"      Assessment & Plan:   OSA (obstructive sleep apnea) Encouraged on daily usage for more than 6 hours.  Advised to avoid falling asleep without his CPAP in the recliner. Healthy sleep regimen discussed. Plan  Patient Instructions  Continue on CPAP At bedtime, wear all night long.  Avoid falling asleep in recliner if possible.  Work on healthy weight.  Do not drive if sleepy Follow up with Dr. Vassie Loll  or Sandrina Heaton NP in 1 year and As needed      Class 1 obesity due to excess calories with serious comorbidity and body mass index (BMI) of 33.0 to 33.9 in adult Healthy weight loss discussed      Rubye Oaks, NP 11/16/2023

## 2023-11-17 ENCOUNTER — Encounter: Payer: Medicare HMO | Admitting: Physical Therapy

## 2023-11-18 ENCOUNTER — Encounter: Payer: Medicare HMO | Admitting: Physical Therapy

## 2023-11-18 DIAGNOSIS — G450 Vertebro-basilar artery syndrome: Secondary | ICD-10-CM | POA: Diagnosis not present

## 2023-11-18 DIAGNOSIS — I6501 Occlusion and stenosis of right vertebral artery: Secondary | ICD-10-CM | POA: Diagnosis not present

## 2023-11-22 ENCOUNTER — Encounter: Payer: Self-pay | Admitting: Neurology

## 2023-11-22 ENCOUNTER — Encounter: Payer: Medicare HMO | Admitting: Physical Therapy

## 2023-11-22 ENCOUNTER — Ambulatory Visit (INDEPENDENT_AMBULATORY_CARE_PROVIDER_SITE_OTHER): Payer: Medicare HMO

## 2023-11-22 DIAGNOSIS — I4891 Unspecified atrial fibrillation: Secondary | ICD-10-CM

## 2023-11-22 LAB — CUP PACEART REMOTE DEVICE CHECK
Date Time Interrogation Session: 20250330231623
Implantable Pulse Generator Implant Date: 20240522

## 2023-11-23 ENCOUNTER — Encounter: Payer: Self-pay | Admitting: Neurology

## 2023-11-23 ENCOUNTER — Ambulatory Visit: Payer: Medicare HMO | Admitting: Neurology

## 2023-11-23 VITALS — BP 122/74 | HR 80 | Ht 69.0 in | Wt 231.0 lb

## 2023-11-23 DIAGNOSIS — R42 Dizziness and giddiness: Secondary | ICD-10-CM | POA: Diagnosis not present

## 2023-11-23 DIAGNOSIS — R799 Abnormal finding of blood chemistry, unspecified: Secondary | ICD-10-CM | POA: Diagnosis not present

## 2023-11-23 DIAGNOSIS — R700 Elevated erythrocyte sedimentation rate: Secondary | ICD-10-CM | POA: Diagnosis not present

## 2023-11-23 DIAGNOSIS — H9193 Unspecified hearing loss, bilateral: Secondary | ICD-10-CM

## 2023-11-23 DIAGNOSIS — E538 Deficiency of other specified B group vitamins: Secondary | ICD-10-CM | POA: Diagnosis not present

## 2023-11-23 DIAGNOSIS — R7982 Elevated C-reactive protein (CRP): Secondary | ICD-10-CM | POA: Diagnosis not present

## 2023-11-23 MED ORDER — TOPIRAMATE 50 MG PO TABS: 50.0000 mg | ORAL_TABLET | Freq: Two times a day (BID) | ORAL | 11 refills | Status: DC

## 2023-11-23 MED ORDER — DIAZEPAM 2 MG PO TABS: 2.0000 mg | ORAL_TABLET | Freq: Three times a day (TID) | ORAL | 3 refills | Status: AC | PRN

## 2023-11-23 NOTE — Progress Notes (Unsigned)
 Chief Complaint  Patient presents with   New Patient (Initial Visit)    Pt in 14, here with with Jack Norris  Pt is referred for dizziness.       ASSESSMENT AND PLAN  [name] is a 83 y.o. male   Recurrent episode of sudden onset vertigo Long history of bilateral sensorineural hearing loss right worse than left  Repeat MRI of the brain with contrast   Laboratory evaluations  Topamax 50 mg twice a day as preventive medication  Valium or meclizine as needed  Differentiation diagnosis include migraine variant, posterior circulation insufficiency, Mnire's syndrome  DIAGNOSTIC DATA (LABS, IMAGING, TESTING) - I reviewed patient records, labs, notes, testing and imaging myself where available.   MEDICAL HISTORY:  Jack Norris, seen in request by Excell Seltzer, MD   History is obtained from the patient and review of electronic medical records. I personally reviewed pertinent available imaging films in PACS.   PMHx of  HTN HLD GERD BPH. OSA=CPAP  Over past few years, he would hav ebertigo, 1-2 a year,  vision in his right eye move, dizziness, naurous, throw up,   Hearing loss, both side, right side is wosre than left,   I Saw him in Feb 2021  Jan 2025, he has several,  , getinto bed, go to sleep sleep for 3-4 hours, he is ok, washed out for a day or two,   He has nto drivein for 6 months,   Once a week, episode,   Several different  Original right eye moveing,   This year,  he was sitting in recliner, in is chair, both eye, spiing bedling, watch cellphone, whoel rrom was gone,   Trying to get to bathroom, slip and fell, hard to help him up,   Go to ER, ease off anueous,   In between thsi peisoe, not all the time, not stalbe, light headed, dizziness, balance issues, unstable, a billte dizziness,   ON Sunday, March 30, suddne onset sidzziness, go back to bed lyding down, cover his eye.  No Headaches,   None of that, not positional  related.  Tinnitis no tinnitis,   Hearing aids x6 years, no noise office, mother has hearing losss.  If he goes to bed, spining stop, he can walk around, washign out feelings, next day,  tis   ASA from 81 to 325, Higher dose of Crestor 10mg  at bedtime,   CT head wo on Jan 24th 2025, 1. No acute intracranial abnormality. 2. Age related atrophy and chronic small vessel ischemia.  CT Angiogram head and neck on Feb 28th 2025. 1.  No acute intracranial hemorrhage.  2.  No findings specific for acute territorial ischemia. Small age-indeterminate basal ganglia ischemic infarctions. If there is clinical suspicion for acute ischemia, then brain MRI would be recommended for further evaluation.  3.  Diminished opacification of the right vertebral artery V4 segment with non-visualization of the right posterior inferior cerebellar artery.  4.  Mild to moderate atherosclerotic stenosis of the right vertebral artery origin. Additional cervical atherosclerotic stenoses, as further described above.  MRI of brain in 2023,  1. No retrocochlear lesion identified. 2. Mild chronic microvascular ischemic changes of the white matter.  Dr. Arlyss Queen at [facility] on November 18 2023,  [name] is a very pleasant 83 yo male with 8 year history of vertigo and multifocal intracranial atherosclerosis and focal V4 segment calcification. I had a very long discussion with the patient in clinic today regarding the natural  course and history of vertebrobasilar insufficiency. His symptoms are less consistent with VBI and more consistent with vertigo. Additionally, his vertebral stenosis is intracranial and therefore unlikely to be changed by positional changes, which would be more likely to be seen in the context of vertebral artery stenosis in the cervical vertebra. We can offer a dynamic angiogram to the patient with positional changes that exacerbate his symptoms while doing the angiogram in order to evaluate for any change in  the posterior circulation, however there are risks associated with this procedure including a small risk of stroke and diagnostic yield will likely be low, but it would definitively rule out vascular etiology of his symptoms. He would like to be scheduled for an angiogram and will call back to cancel if the neurologist that he sees in the interim advises him otherwise. He will also look into a vertigo specialist through his primary ENT.      PHYSICAL EXAM:   Vitals:   11/23/23 1129 11/23/23 1137  BP: 131/78 122/74  Pulse: 73 80  Weight: 231 lb (104.8 kg)   Height: 5\' 9"  (1.753 m)    Not recorded     Body mass index is 34.11 kg/m.  PHYSICAL EXAMNIATION:  Gen: NAD, conversant, well nourised, well groomed                     Cardiovascular: Regular rate rhythm, no peripheral edema, warm, nontender. Eyes: Conjunctivae clear without exudates or hemorrhage Neck: Supple, no carotid bruits. Pulmonary: Clear to auscultation bilaterally   NEUROLOGICAL EXAM:  MENTAL STATUS: Speech/cognition: Awake, alert, oriented to history taking and casual conversation CRANIAL NERVES: CN II: Visual fields are full to confrontation. Pupils are round equal and briskly reactive to light. CN III, IV, VI: extraocular movement are normal. No ptosis. CN V: Facial sensation is intact to light touch CN VII: Face is symmetric with normal eye closure  CN VIII: Hearing is normal to causal conversation. CN IX, X: Phonation is normal. CN XI: Head turning and shoulder shrug are intact  MOTOR: There is no pronator drift of out-stretched arms. Muscle bulk and tone are normal. Muscle strength is normal.  REFLEXES: Reflexes are 1 and symmetric at the biceps, triceps, knees, and ankles. Plantar responses are flexor.  SENSORY: Intact to light touch, pinprick and vibratory sensation are intact in fingers and toes.  COORDINATION: There is no trunk or limb dysmetria noted.  GAIT/STANCE: Posture is normal. Gait  is steady    Epley's maneuver: Right or left ear dependent position trigger no vertigo  REVIEW OF SYSTEMS:  Full 14 system review of systems performed and notable only for as above All other review of systems were negative.   ALLERGIES: Allergies  Allergen Reactions   Paroxetine     REACTION: Had mild personality change on this medication and doesn't want to take this again.   Morphine And Codeine     Causes bladder problems   Ciprofloxacin Other (See Comments)    ABDOMINAL PAIN/ sob   Flagyl [Metronidazole Hcl] Other (See Comments)    ABDOMINAL PAIN/ sob    HOME MEDICATIONS: Current Outpatient Medications  Medication Sig Dispense Refill   albuterol (VENTOLIN HFA) 108 (90 Base) MCG/ACT inhaler Inhale 2 puffs into the lungs every 6 (six) hours as needed for wheezing or shortness of breath. 8 g 2   amLODipine (NORVASC) 10 MG tablet TAKE 1 TABLET BY MOUTH EVERY DAY 90 tablet 3   aspirin EC 325 MG tablet Take  325 mg by mouth daily.     B-D 3CC LUER-LOK SYR 18GX1-1/2 18G X 1-1/2" 3 ML MISC USE ONCE A WEEK AS DIRECTED  2   Cholecalciferol (VITAMIN D3) 1.25 MG (50000 UT) CAPS Take 1 capsule (1.25 mg total) by mouth once a week. 12 capsule 0   fenofibrate micronized (LOFIBRA) 67 MG capsule TAKE 1 CAPSULE(67 MG) BY MOUTH DAILY BEFORE BREAKFAST 90 capsule 2   ketorolac (ACULAR) 0.5 % ophthalmic solution Place 1 drop into the left eye every 4 (four) hours.     lisinopril (ZESTRIL) 40 MG tablet TAKE 1 TABLET(40 MG) BY MOUTH DAILY 90 tablet 3   meclizine (ANTIVERT) 25 MG tablet Take 1 tablet (25 mg total) by mouth 3 (three) times daily as needed for dizziness. 30 tablet 0   mometasone (ELOCON) 0.1 % cream Apply 1 Application topically daily.     omeprazole (PRILOSEC) 20 MG capsule Take 20 mg by mouth daily. Per patient OTC     psyllium (METAMUCIL) 58.6 % powder Take 1 packet by mouth daily.     rosuvastatin (CRESTOR) 10 MG tablet Take 1 tablet (10 mg total) by mouth daily. 90 tablet 3    testosterone cypionate (DEPOTESTOSTERONE CYPIONATE) 200 MG/ML injection Inject 0.5 mLs (100 mg total) into the muscle every 7 (seven) days. 10 mL 0   triamcinolone (KENALOG) 0.025 % cream Apply 1 application topically as needed.     vitamin B-12 (CYANOCOBALAMIN) 100 MCG tablet Take 100 mcg by mouth daily.     No current facility-administered medications for this visit.    PAST MEDICAL HISTORY: Past Medical History:  Diagnosis Date   Asthma, mild    as child-only now if has URI   BPH (benign prostatic hypertrophy)    BPPV (benign paroxysmal positional vertigo)    Diverticulosis    Eczema    Fatty liver disease, nonalcoholic    Frequent nosebleeds    History of   GERD (gastroesophageal reflux disease)    occ uses TUMS as needed   Glaucoma    History of bronchitis    History of colon polyps    History of diverticulitis    History of kidney stones    Hyperlipemia    Hypertension    Influenza A 11/2017   Memory changes    OA (osteoarthritis)    Obesity    Overactive bladder 2020   PVCs (premature ventricular contractions)    Renal calculi right    AND LEFT X1 NON-OBSTRUCTIVE   Right sided sciatica    Bilateral buttock and thighs   Simple renal cyst right interpoler, simpler in nature , asymptomatic  per urologist note (dr dahlstedt)  12-26-2011   BENIGN, has been drained   Testosterone deficiency    Wears glasses    Wears hearing aid     PAST SURGICAL HISTORY: Past Surgical History:  Procedure Laterality Date   APPENDECTOMY     bone spur removal     BILATERAL GREAT TOE   COLONOSCOPY     CYSTO/ RIGHT RETROGRADE PYELOGRAM/ RIGHT URETER DILATATION/ RIGHT URETEROSCOPY/ LASER LITHOTRIPSY RIGHT RENAL PELVIS AND LOWER POLE CALCULI/ PLACEMENT STENT  01-11-2012  DR DAHLSTEDT   RIGHT RENAL PELVIS AND LOWER POLE CALCULI   CYSTOSCOPY W/ RETROGRADES  06/02/2012   Procedure: CYSTOSCOPY WITH RETROGRADE PYELOGRAM;  Surgeon: Marcine Matar, MD;  Location: [facility];  Service: Urology;  Laterality: Right;   CYSTOSCOPY WITH URETEROSCOPY  06/02/2012   Procedure: CYSTOSCOPY WITH URETEROSCOPY;  Surgeon: Jeannett Senior  Dahlstedt, MD;  Location: [facility];  Service: Urology;  Laterality: Right;  90 MIN ALSO EXTRACTION OF CALCULI    EYE SURGERY Bilateral 2020   LITHOTRIPSY  APPROX.  1998   ESWL  left kidney x 2   ROTATOR CUFF REPAIR  2012   LEFT SHOULDER   SHOULDER ARTHROSCOPY WITH ROTATOR CUFF REPAIR AND SUBACROMIAL DECOMPRESSION  09/12/2012   Procedure: SHOULDER ARTHROSCOPY WITH ROTATOR CUFF REPAIR AND SUBACROMIAL DECOMPRESSION;  Surgeon: Mable Paris, MD;  Location: [facility];  Service: Orthopedics;  Laterality: Right;  see above and biceps tendonotomy   SKIN LESION EXCISION Right 07/07/2021   right side of face near right ear   TRANSURETHRAL RESECTION OF BLADDER NECK N/A 04/08/2018   Procedure: TRANSURETHRAL RESECTION OF BLADDER NECK CONTRACTURE;  Surgeon: Marcine Matar, MD;  Location: [facility];  Service: Urology;  Laterality: N/A;   TRANSURETHRAL RESECTION OF PROSTATE  11/29/2009   BPH   VASECTOMY  1970'S   GEN. ANES.    FAMILY HISTORY: Family History  Problem Relation Age of Onset   Cancer Mother        breast and kidney    Squamous cell carcinoma Mother    Cancer Father        lung   Heart disease Father     SOCIAL HISTORY: Social History   Socioeconomic History   Marital status: Married    Spouse name: Not on file   Number of children: 3   Years of education: college   Highest education level: Bachelor's degree (e.g., BA, AB, BS)  Occupational History   Occupation: QUOTATION ENGINEER    Employer: [employer]  Tobacco Use   Smoking status: Former    Current packs/day: 0.00    Types: Cigarettes    Start date: 01/05/1959    Quit date: 01/05/1967    Years since quitting: 56.9   Smokeless tobacco: Never   Tobacco comments:    QUIT AGE 33  Vaping Use   Vaping  status: Never Used  Substance and Sexual Activity   Alcohol use: Yes    Alcohol/week: 0.0 standard drinks of alcohol    Comment: OCCASIONAL   Drug use: No   Sexual activity: Yes  Other Topics Concern   Not on file  Social History Narrative   regular exercise    Full code, (Reviewed 2014)      Lives at home with his wife.   Right-handed.   Caffeine use: 4 cups per day.   Social Drivers of Corporate investment banker Strain: Low Risk  (09/13/2023)   Overall Financial Resource Strain (CARDIA)    Difficulty of Paying Living Expenses: Not hard at all  Food Insecurity: No Food Insecurity (09/13/2023)   Hunger Vital Sign    Worried About Running Out of Food in the Last Year: Never true    Ran Out of Food in the Last Year: Never true  Transportation Needs: No Transportation Needs (09/13/2023)   PRAPARE - Administrator, Civil Service (Medical): No    Lack of Transportation (Non-Medical): No  Physical Activity: Inactive (09/13/2023)   Exercise Vital Sign    Days of Exercise per Week: 0 days    Minutes of Exercise per Session: 40 min  Stress: Stress Concern Present (09/13/2023)   [employer] of Occupational Health - Occupational Stress Questionnaire    Feeling of Stress : To some extent  Social Connections: Moderately Integrated (09/13/2023)   Social Connection  and Isolation Panel [NHANES]    Frequency of Communication with Friends and Family: Once a week    Frequency of Social Gatherings with Friends and Family: Once a week    Attends Religious Services: More than 4 times per year    Active Member of [employer] or Organizations: Yes    Attends Banker Meetings: 1 to 4 times per year    Marital Status: Married  Catering manager Violence: Not At Risk (07/15/2023)   Humiliation, Afraid, Rape, and Kick questionnaire    Fear of Current or Ex-Partner: No    Emotionally Abused: No    Physically Abused: No    Sexually Abused: No      Levert Feinstein, M.D.  Ph.D.  [facility] [address] [place], [place] [address] Ph: [phone] Fax: [phone]  CC:  Marita Kansas, PA-C [address] [place],  [place] [address]  Excell Seltzer, MD

## 2023-11-23 NOTE — Patient Instructions (Signed)
 Meds ordered this encounter  Medications   topiramate (TOPAMAX) 50 MG tablet    Sig: Take 1 tablet (50 mg total) by mouth 2 (two) times daily.    Dispense:  60 tablet    Refill:  11   diazepam (VALIUM) 2 MG tablet    Sig: Take 1 tablet (2 mg total) by mouth every 8 (eight) hours as needed for anxiety.    Dispense:  30 tablet    Refill:  3

## 2023-11-24 ENCOUNTER — Encounter: Payer: Medicare HMO | Admitting: Physical Therapy

## 2023-11-24 NOTE — Progress Notes (Signed)
 [facility] Summary Report / Loop Recorder

## 2023-11-24 NOTE — Addendum Note (Signed)
 Addended by: Geralyn Flash D on: 11/24/2023 10:24 AM   Modules accepted: Orders

## 2023-11-25 LAB — RENAL FUNCTION PANEL
Albumin: 4.7 g/dL (ref 3.7–4.7)
BUN/Creatinine Ratio: 13 (ref 10–24)
BUN: 16 mg/dL (ref 8–27)
CO2: 21 mmol/L (ref 20–29)
Calcium: 9.9 mg/dL (ref 8.6–10.2)
Chloride: 104 mmol/L (ref 96–106)
Creatinine, Ser: 1.28 mg/dL — ABNORMAL HIGH (ref 0.76–1.27)
Glucose: 75 mg/dL (ref 70–99)
Phosphorus: 3.5 mg/dL (ref 2.8–4.1)
Potassium: 4.6 mmol/L (ref 3.5–5.2)
Sodium: 140 mmol/L (ref 134–144)
eGFR: 56 mL/min/{1.73_m2} — ABNORMAL LOW (ref >59)

## 2023-11-25 LAB — SEDIMENTATION RATE: Sed Rate: 8 mm/h (ref 0–30)

## 2023-11-25 LAB — ANA W/REFLEX IF POSITIVE: Anti Nuclear Antibody (ANA): NEGATIVE

## 2023-11-25 LAB — B12 AND FOLATE PANEL
Folate: 10.3 ng/mL (ref >3.0)
Vitamin B-12: 803 pg/mL (ref 232–1245)

## 2023-11-25 LAB — C-REACTIVE PROTEIN: CRP: 4 mg/L (ref 0–10)

## 2023-11-26 ENCOUNTER — Encounter: Payer: Self-pay | Admitting: Neurology

## 2023-11-30 ENCOUNTER — Encounter: Payer: Self-pay | Admitting: Neurology

## 2023-12-02 ENCOUNTER — Telehealth: Payer: Self-pay | Admitting: Neurology

## 2023-12-02 NOTE — Telephone Encounter (Signed)
 Francine Graven Berkley Harvey: [id number] exp. 12/02/23-01/31/24 sent to [facility] [phone]

## 2023-12-06 ENCOUNTER — Ambulatory Visit (HOSPITAL_COMMUNITY)
Admission: RE | Admit: 2023-12-06 | Discharge: 2023-12-06 | Disposition: A | Discharge status: Home / Self Care | Source: Ambulatory Visit | Attending: Neurology | Admitting: Neurology

## 2023-12-06 DIAGNOSIS — R42 Dizziness and giddiness: Secondary | ICD-10-CM | POA: Diagnosis not present

## 2023-12-06 DIAGNOSIS — H9190 Unspecified hearing loss, unspecified ear: Secondary | ICD-10-CM | POA: Diagnosis not present

## 2023-12-06 DIAGNOSIS — G319 Degenerative disease of nervous system, unspecified: Secondary | ICD-10-CM | POA: Diagnosis not present

## 2023-12-06 DIAGNOSIS — I6782 Cerebral ischemia: Secondary | ICD-10-CM | POA: Diagnosis not present

## 2023-12-06 MED ORDER — GADOBUTROL 1 MMOL/ML IV SOLN
10.0000 mL | Freq: Once | INTRAVENOUS | Status: AC | PRN
  Administered 2023-12-06: 10 mL via INTRAVENOUS

## 2023-12-07 ENCOUNTER — Encounter: Payer: Self-pay | Admitting: Neurology

## 2023-12-16 ENCOUNTER — Encounter: Payer: Self-pay | Admitting: Family Medicine

## 2023-12-16 ENCOUNTER — Ambulatory Visit (INDEPENDENT_AMBULATORY_CARE_PROVIDER_SITE_OTHER): Payer: Medicare HMO | Admitting: Family Medicine

## 2023-12-16 VITALS — BP 130/68 | HR 88 | Temp 97.8°F | Ht 69.0 in | Wt 233.4 lb

## 2023-12-16 DIAGNOSIS — N183 Chronic kidney disease, stage 3 unspecified: Secondary | ICD-10-CM | POA: Diagnosis not present

## 2023-12-16 DIAGNOSIS — R21 Rash and other nonspecific skin eruption: Secondary | ICD-10-CM | POA: Diagnosis not present

## 2023-12-16 DIAGNOSIS — R42 Dizziness and giddiness: Secondary | ICD-10-CM | POA: Diagnosis not present

## 2023-12-16 NOTE — Progress Notes (Signed)
 Patient ID: Jack Norris, male    DOB: May 02, 1941, 83 y.o.   MRN: [id number]  This visit was conducted in person.  BP 130/68 (BP Location: Left Arm, Patient Position: Sitting, Cuff Size: Large)   Pulse 88   Temp 97.8 F (36.6 C) (Temporal)   Ht 5\' 9"  (1.753 m)   Wt 233 lb 6 oz (105.9 kg)   SpO2 96%   BMI 34.46 kg/m    CC:  Chief Complaint  Patient presents with   Medical Management of Chronic Issues    Follow up Multiple Issues    Subjective:   HPI: Jack Norris is a 83 y.o. male presenting on 12/16/2023 for Medical Management of Chronic Issues (Follow up Multiple Issues)  Recent ED visit on September 17, 2023 with dizziness. vertigo, room spinning with movement of head. Continue to have intermittent spells... meclizine   Associated with nausea and emesis, dry heaves  Has been replacing vit D and B12. Was evaluated with EKG: Head CT: No acute intracranial process, chronic atrophy Complete metabolic panel: Normal CBC: White blood cells 12.7, normal hemoglobin and platelets Symptoms felt to be consistent with vertigo.  Negative orthostatic vitals.  Referred to neurology... appt is 11/2023 Felt unlikely symptoms were due to CVA. Treated with meclizine  At last OV with me 09/2023.. referred to PT for vestibular rehab  He felt this was not helpful.   2020 MRI brain, IAC: no finding  2023 IMPRESSION: 1. No retrocochlear lesion identified. 2. Mild chronic microvascular ischemic changes of the white matter.   Saw ENT Dr. Donalee Fruits   Neuro appt Dr. Gracie Lav 11/23/2023 given valium  and topamax  but he has not been using these.  Repeat MRI normal.   Saw Vascular Dr. Furman Jobs:  Felt minimal concern about posterior circulation isse. Referred to [facility]... after review of imaging and given symptoms... did not recommend angiogram.   Today he reports: He tried topamax  but it made him feel bad...  so stopped. He has not had vertigo in last month! Still does get mild woozy feeling if  looks up quickly. If that happens he takes meclizine  1-2 times in  few weeks.  Has appt with [facility] vertigo specialist in June.    Some decrease in kidney.. function but it has been gradaul... stable in about the last 3 years.  No NSAIDs.  Family history of mother with kidney cancer.   HAs patches of eczema on back and legs. Dermatologist he has seen had not helped... multiple creams not he;lping... spreading. He requires second opinion.  Relevant past medical, surgical, family and social history reviewed and updated as indicated. Interim medical history since our last visit reviewed. Allergies and medications reviewed and updated. Outpatient Medications Prior to Visit  Medication Sig Dispense Refill   albuterol  (VENTOLIN  HFA) 108 (90 Base) MCG/ACT inhaler Inhale 2 puffs into the lungs every 6 (six) hours as needed for wheezing or shortness of breath. 8 g 2   amLODipine  (NORVASC ) 10 MG tablet TAKE 1 TABLET BY MOUTH EVERY DAY 90 tablet 3   aspirin EC 325 MG tablet Take 325 mg by mouth daily.     B-D 3CC LUER-LOK SYR 18GX1-1/2 18G X 1-1/2" 3 ML MISC USE ONCE A WEEK AS DIRECTED  2   Cholecalciferol (VITAMIN D3) 1.25 MG (50000 UT) CAPS Take 1 capsule (1.25 mg total) by mouth once a week. 12 capsule 0   fenofibrate  micronized (LOFIBRA) 67 MG capsule TAKE 1 CAPSULE(67 MG) BY MOUTH DAILY BEFORE BREAKFAST 90  capsule 2   ketorolac  (ACULAR ) 0.5 % ophthalmic solution Place 1 drop into the left eye every 4 (four) hours.     lisinopril  (ZESTRIL ) 40 MG tablet TAKE 1 TABLET(40 MG) BY MOUTH DAILY 90 tablet 3   meclizine  (ANTIVERT ) 25 MG tablet Take 1 tablet (25 mg total) by mouth 3 (three) times daily as needed for dizziness. 30 tablet 0   mometasone (ELOCON) 0.1 % cream Apply 1 Application topically daily.     omeprazole (PRILOSEC) 20 MG capsule Take 20 mg by mouth daily. Per patient OTC     psyllium (METAMUCIL) 58.6 % powder Take 1 packet by mouth daily.     rosuvastatin  (CRESTOR ) 10 MG tablet Take 1  tablet (10 mg total) by mouth daily. 90 tablet 3   testosterone  cypionate (DEPOTESTOSTERONE CYPIONATE) 200 MG/ML injection Inject 0.5 mLs (100 mg total) into the muscle every 7 (seven) days. 10 mL 0   triamcinolone  (KENALOG ) 0.025 % cream Apply 1 application topically as needed.     vitamin B-12 (CYANOCOBALAMIN ) 100 MCG tablet Take 100 mcg by mouth daily.     diazepam  (VALIUM ) 2 MG tablet Take 1 tablet (2 mg total) by mouth every 8 (eight) hours as needed for anxiety. (Patient not taking: Reported on 12/16/2023) 30 tablet 3   topiramate  (TOPAMAX ) 50 MG tablet Take 1 tablet (50 mg total) by mouth 2 (two) times daily. (Patient not taking: Reported on 12/16/2023) 60 tablet 11   No facility-administered medications prior to visit.     Per HPI unless specifically indicated in ROS section below Review of Systems  Constitutional:  Negative for fatigue and fever.  HENT:  Negative for ear pain.   Eyes:  Negative for pain.  Respiratory:  Negative for cough and shortness of breath.   Cardiovascular:  Negative for chest pain, palpitations and leg swelling.  Gastrointestinal:  Negative for abdominal pain.  Genitourinary:  Negative for dysuria.  Musculoskeletal:  Negative for arthralgias.  Neurological:  Positive for dizziness. Negative for syncope, light-headedness and headaches.  Psychiatric/Behavioral:  Negative for dysphoric mood.    Objective:  BP 130/68 (BP Location: Left Arm, Patient Position: Sitting, Cuff Size: Large)   Pulse 88   Temp 97.8 F (36.6 C) (Temporal)   Ht 5\' 9"  (1.753 m)   Wt 233 lb 6 oz (105.9 kg)   SpO2 96%   BMI 34.46 kg/m   Wt Readings from Last 3 Encounters:  12/16/23 233 lb 6 oz (105.9 kg)  11/23/23 231 lb (104.8 kg)  11/16/23 231 lb 12.8 oz (105.1 kg)      Physical Exam Constitutional:      Appearance: He is well-developed.  HENT:     Head: Normocephalic.     Right Ear: Hearing normal.     Left Ear: Hearing normal.     Nose: Nose normal.  Neck:      Thyroid : No thyroid  mass or thyromegaly.     Vascular: No carotid bruit.     Trachea: Trachea normal.  Cardiovascular:     Rate and Rhythm: Normal rate and regular rhythm.     Pulses: Normal pulses.     Heart sounds: Heart sounds not distant. No murmur heard.    No friction rub. No gallop.     Comments: No peripheral edema Pulmonary:     Effort: Pulmonary effort is normal. No respiratory distress.     Breath sounds: Normal breath sounds.  Skin:    General: Skin is warm and dry.  Findings: No rash.  Neurological:     General: No focal deficit present.     Mental Status: He is oriented to person, place, and time.     Cranial Nerves: Cranial nerves 2-12 are intact.     Sensory: Sensation is intact.     Motor: Motor function is intact.     Coordination: Coordination is intact.     Gait: Gait is intact.  Psychiatric:        Speech: Speech normal.        Behavior: Behavior normal.        Thought Content: Thought content normal.       Results for orders placed or performed in visit on 11/23/23  Renal Function Panel   Collection Time: 11/23/23 12:24 PM  Result Value Ref Range   Glucose 75 70 - 99 mg/dL   BUN 16 8 - 27 mg/dL   Creatinine, Ser 2.95 (H) 0.76 - 1.27 mg/dL   eGFR 56 (L) >28 UX/LKG/4.01   BUN/Creatinine Ratio 13 10 - 24   Sodium 140 134 - 144 mmol/L   Potassium 4.6 3.5 - 5.2 mmol/L   Chloride 104 96 - 106 mmol/L   CO2 21 20 - 29 mmol/L   Calcium  9.9 8.6 - 10.2 mg/dL   Phosphorus 3.5 2.8 - 4.1 mg/dL   Albumin 4.7 3.7 - 4.7 g/dL  C-reactive protein   Collection Time: 11/23/23 12:24 PM  Result Value Ref Range   CRP 4 0 - 10 mg/L  Sedimentation rate   Collection Time: 11/23/23 12:24 PM  Result Value Ref Range   Sed Rate 8 0 - 30 mm/hr  ANA w/Reflex if Positive   Collection Time: 11/23/23 12:24 PM  Result Value Ref Range   Anti Nuclear Antibody (ANA) Negative Negative  B12 and Folate Panel   Collection Time: 11/23/23 12:24 PM  Result Value Ref Range    Vitamin B-12 803 232 - 1,245 pg/mL   Folate 10.3 >3.0 ng/mL    Assessment and Plan  Chronic renal insufficiency, stage 3 (moderate) (HCC) Assessment & Plan: Acute worsening of chronic issue. Increase water intake and return for re-eval.   Likely secondary to HTN, age t, decreased water intake.  Orders: -     Renal function panel; Future  Vertigo Assessment & Plan: Unclear etiology, but seems to be improving with time.  Completed extensive work up.. neg labs  Referred to neurology... appt is 11/2023 Felt unlikely symptoms were due to CVA. Treated with meclizine  At last OV with me 09/2023.. referred to PT for vestibular rehab He felt this was not helpful.   2020 MRI brain, IAC: no finding  2023 IMPRESSION: 1. No retrocochlear lesion identified. 2. Mild chronic microvascular ischemic changes of the white matter.   Saw ENT Dr. Donalee Fruits   Neuro appt Dr. Gracie Lav 11/23/2023 given valium  and topamax  but he has not been using these.  Repeat MRI normal.   Saw Vascular Dr. Furman Jobs:  Felt minimal concern about posterior circulation isse. Referred to [facility]... after review of imaging and given symptoms... did not recommend angiogram.   Skin rash Assessment & Plan: Chronic, recurrent, multiple creams not helpful.  Referred to [facility] for second opinion  Orders: -     Ambulatory referral to Dermatology     Return in about 3 months (around 03/16/2024) for  LAB only For renal function recheck.Herby Lolling, MD

## 2023-12-16 NOTE — Patient Instructions (Addendum)
 Push water intake... return recheck kidney.  Referred to Dermatology.

## 2023-12-17 ENCOUNTER — Ambulatory Visit: Payer: Medicare HMO | Admitting: Neurology

## 2023-12-23 DIAGNOSIS — H353122 Nonexudative age-related macular degeneration, left eye, intermediate dry stage: Secondary | ICD-10-CM | POA: Diagnosis not present

## 2023-12-23 DIAGNOSIS — H43811 Vitreous degeneration, right eye: Secondary | ICD-10-CM | POA: Diagnosis not present

## 2023-12-23 DIAGNOSIS — H34232 Retinal artery branch occlusion, left eye: Secondary | ICD-10-CM | POA: Diagnosis not present

## 2023-12-23 DIAGNOSIS — H35373 Puckering of macula, bilateral: Secondary | ICD-10-CM | POA: Diagnosis not present

## 2023-12-23 DIAGNOSIS — G4733 Obstructive sleep apnea (adult) (pediatric): Secondary | ICD-10-CM | POA: Diagnosis not present

## 2023-12-23 DIAGNOSIS — H35352 Cystoid macular degeneration, left eye: Secondary | ICD-10-CM | POA: Diagnosis not present

## 2023-12-23 DIAGNOSIS — H34832 Tributary (branch) retinal vein occlusion, left eye, with macular edema: Secondary | ICD-10-CM | POA: Diagnosis not present

## 2023-12-27 ENCOUNTER — Ambulatory Visit (INDEPENDENT_AMBULATORY_CARE_PROVIDER_SITE_OTHER): Payer: Medicare HMO

## 2023-12-27 DIAGNOSIS — I4891 Unspecified atrial fibrillation: Secondary | ICD-10-CM | POA: Diagnosis not present

## 2023-12-27 LAB — CUP PACEART REMOTE DEVICE CHECK
Date Time Interrogation Session: 20250504233219
Implantable Pulse Generator Implant Date: 20240522

## 2024-01-04 ENCOUNTER — Ambulatory Visit: Payer: Medicare HMO | Admitting: Cardiovascular Disease

## 2024-01-05 NOTE — Progress Notes (Signed)
 [facility] Summary Report / Loop Recorder

## 2024-01-11 ENCOUNTER — Encounter (INDEPENDENT_AMBULATORY_CARE_PROVIDER_SITE_OTHER): Payer: Self-pay

## 2024-01-11 NOTE — Assessment & Plan Note (Signed)
 Chronic, recurrent, multiple creams not helpful.  Referred to [facility] for second opinion

## 2024-01-11 NOTE — Assessment & Plan Note (Signed)
 Acute worsening of chronic issue. Increase water intake and return for re-eval.   Likely secondary to HTN, age t, decreased water intake.

## 2024-01-11 NOTE — Assessment & Plan Note (Addendum)
 Unclear etiology, but seems to be improving with time.  Completed extensive work up.. neg labs  Referred to neurology... appt is 11/2023 Felt unlikely symptoms were due to CVA. Treated with meclizine  At last OV with me 09/2023.. referred to PT for vestibular rehab He felt this was not helpful.   2020 MRI brain, IAC: no finding  2023 IMPRESSION: 1. No retrocochlear lesion identified. 2. Mild chronic microvascular ischemic changes of the white matter.   Saw ENT Dr. Donalee Fruits   Neuro appt Dr. Gracie Lav 11/23/2023 given valium  and topamax  but he has not been using these.  Repeat MRI normal.   Saw Vascular Dr. Furman Jobs:  Felt minimal concern about posterior circulation isse. Referred to [facility]... after review of imaging and given symptoms... did not recommend angiogram.

## 2024-01-12 DIAGNOSIS — H5021 Vertical strabismus, right eye: Secondary | ICD-10-CM | POA: Diagnosis not present

## 2024-01-12 DIAGNOSIS — H532 Diplopia: Secondary | ICD-10-CM | POA: Diagnosis not present

## 2024-01-12 DIAGNOSIS — H04123 Dry eye syndrome of bilateral lacrimal glands: Secondary | ICD-10-CM | POA: Diagnosis not present

## 2024-01-12 DIAGNOSIS — Z0100 Encounter for examination of eyes and vision without abnormal findings: Secondary | ICD-10-CM | POA: Diagnosis not present

## 2024-01-12 DIAGNOSIS — Z961 Presence of intraocular lens: Secondary | ICD-10-CM | POA: Diagnosis not present

## 2024-01-12 DIAGNOSIS — H16143 Punctate keratitis, bilateral: Secondary | ICD-10-CM | POA: Diagnosis not present

## 2024-01-24 DIAGNOSIS — H9311 Tinnitus, right ear: Secondary | ICD-10-CM | POA: Diagnosis not present

## 2024-01-24 DIAGNOSIS — R42 Dizziness and giddiness: Secondary | ICD-10-CM | POA: Diagnosis not present

## 2024-01-24 DIAGNOSIS — H903 Sensorineural hearing loss, bilateral: Secondary | ICD-10-CM | POA: Diagnosis not present

## 2024-01-25 DIAGNOSIS — H6121 Impacted cerumen, right ear: Secondary | ICD-10-CM | POA: Diagnosis not present

## 2024-01-27 ENCOUNTER — Ambulatory Visit (INDEPENDENT_AMBULATORY_CARE_PROVIDER_SITE_OTHER)

## 2024-01-27 DIAGNOSIS — I4891 Unspecified atrial fibrillation: Secondary | ICD-10-CM | POA: Diagnosis not present

## 2024-01-27 LAB — CUP PACEART REMOTE DEVICE CHECK
Date Time Interrogation Session: 20250604233540
Implantable Pulse Generator Implant Date: 20240522

## 2024-02-01 ENCOUNTER — Ambulatory Visit: Attending: Cardiovascular Disease | Admitting: Cardiovascular Disease

## 2024-02-01 ENCOUNTER — Encounter: Payer: Self-pay | Admitting: Cardiovascular Disease

## 2024-02-01 ENCOUNTER — Encounter: Payer: Self-pay | Admitting: Family Medicine

## 2024-02-01 VITALS — BP 130/72 | HR 81 | Ht 69.0 in | Wt 235.4 lb

## 2024-02-01 DIAGNOSIS — I2510 Atherosclerotic heart disease of native coronary artery without angina pectoris: Secondary | ICD-10-CM | POA: Diagnosis not present

## 2024-02-01 DIAGNOSIS — I700 Atherosclerosis of aorta: Secondary | ICD-10-CM

## 2024-02-01 DIAGNOSIS — E7800 Pure hypercholesterolemia, unspecified: Secondary | ICD-10-CM | POA: Diagnosis not present

## 2024-02-01 DIAGNOSIS — I493 Ventricular premature depolarization: Secondary | ICD-10-CM | POA: Diagnosis not present

## 2024-02-01 DIAGNOSIS — I10 Essential (primary) hypertension: Secondary | ICD-10-CM

## 2024-02-01 MED ORDER — ROSUVASTATIN CALCIUM 40 MG PO TABS: 40.0000 mg | ORAL_TABLET | Freq: Every day | ORAL | 3 refills | Status: AC

## 2024-02-01 NOTE — Progress Notes (Unsigned)
 Cardiology Office Note   Date:  02/01/2024   ID:  Jack Norris, DOB 1941-04-12, MRN [id number]  PCP:  Judithann Novas, MD  Cardiologist:   Antionette Kirks, MD   Chief Complaint  Patient presents with   Follow-up    Pt request early f/u no complaints today. Meds reviewed verbally with pt.      History of Present Illness: Jack Norris is a 83 y.o. male who presents to establish cardiovascular care.  He was previously followed by Dr. Jacquelynn Matter.  He has known history of hyperlipidemia, nonobstructive coronary artery disease, essential hypertension, hyperlipidemia and PVCs.  He had previous cardiac CTA in December 2019 which showed a calcium  score of 976 with extensive LAD plaque causing moderate stenosis and significant stenosis in the right PDA.  CT FFR was negative.  Carotid Doppler in February 2024 showed no significant disease.  Echocardiogram also in February 2024 showed an EF of 50 to 55% with no significant valvular abnormalities.  He denies chest pain or shortness of breath.  His biggest issue continues to be vertigo with extensive workup by neurology and ENT.    Past Medical History:  Diagnosis Date   Asthma, mild    as child-only now if has URI   BPH (benign prostatic hypertrophy)    BPPV (benign paroxysmal positional vertigo)    Diverticulosis    Eczema    Fatty liver disease, nonalcoholic    Frequent nosebleeds    History of   GERD (gastroesophageal reflux disease)    occ uses TUMS as needed   Glaucoma    History of bronchitis    History of colon polyps    History of diverticulitis    History of kidney stones    Hyperlipemia    Hypertension    Influenza A 11/2017   Memory changes    OA (osteoarthritis)    Obesity    Overactive bladder 2020   PVCs (premature ventricular contractions)    Renal calculi right    AND LEFT X1 NON-OBSTRUCTIVE   Right sided sciatica    Bilateral buttock and thighs   Simple renal cyst right interpoler, simpler in  nature , asymptomatic  per urologist note (dr dahlstedt)  12-26-2011   BENIGN, has been drained   Testosterone  deficiency    Wears glasses    Wears hearing aid     Past Surgical History:  Procedure Laterality Date   APPENDECTOMY     bone spur removal     BILATERAL GREAT TOE   COLONOSCOPY     CYSTO/ RIGHT RETROGRADE PYELOGRAM/ RIGHT URETER DILATATION/ RIGHT URETEROSCOPY/ LASER LITHOTRIPSY RIGHT RENAL PELVIS AND LOWER POLE CALCULI/ PLACEMENT STENT  01-11-2012  DR DAHLSTEDT   RIGHT RENAL PELVIS AND LOWER POLE CALCULI   CYSTOSCOPY W/ RETROGRADES  06/02/2012   Procedure: CYSTOSCOPY WITH RETROGRADE PYELOGRAM;  Surgeon: Trent Frizzle, MD;  Location: [facility];  Service: Urology;  Laterality: Right;   CYSTOSCOPY WITH URETEROSCOPY  06/02/2012   Procedure: CYSTOSCOPY WITH URETEROSCOPY;  Surgeon: Trent Frizzle, MD;  Location: [facility];  Service: Urology;  Laterality: Right;  90 MIN ALSO EXTRACTION OF CALCULI    EYE SURGERY Bilateral 2020   LITHOTRIPSY  APPROX.  1998   ESWL  left kidney x 2   ROTATOR CUFF REPAIR  2012   LEFT SHOULDER   SHOULDER ARTHROSCOPY WITH ROTATOR CUFF REPAIR AND SUBACROMIAL DECOMPRESSION  09/12/2012   Procedure: SHOULDER ARTHROSCOPY WITH ROTATOR CUFF REPAIR AND SUBACROMIAL DECOMPRESSION;  Surgeon: Derald Flattery, MD;  Location: [facility];  Service: Orthopedics;  Laterality: Right;  see above and biceps tendonotomy   SKIN LESION EXCISION Right 07/07/2021   right side of face near right ear   TRANSURETHRAL RESECTION OF BLADDER NECK N/A 04/08/2018   Procedure: TRANSURETHRAL RESECTION OF BLADDER NECK CONTRACTURE;  Surgeon: Trent Frizzle, MD;  Location: [facility];  Service: Urology;  Laterality: N/A;   TRANSURETHRAL RESECTION OF PROSTATE  11/29/2009   BPH   VASECTOMY  1970'S   GEN. ANES.     Current Outpatient Medications  Medication Sig Dispense Refill   albuterol  (VENTOLIN  HFA)  108 (90 Base) MCG/ACT inhaler Inhale 2 puffs into the lungs every 6 (six) hours as needed for wheezing or shortness of breath. 8 g 2   amLODipine  (NORVASC ) 10 MG tablet TAKE 1 TABLET BY MOUTH EVERY DAY 90 tablet 3   aspirin EC 325 MG tablet Take 325 mg by mouth daily.     B-D 3CC LUER-LOK SYR 18GX1-1/2 18G X 1-1/2 3 ML MISC USE ONCE A WEEK AS DIRECTED  2   Cholecalciferol (VITAMIN D3) 1.25 MG (50000 UT) CAPS Take 1 capsule (1.25 mg total) by mouth once a week. 12 capsule 0   fenofibrate  micronized (LOFIBRA) 67 MG capsule TAKE 1 CAPSULE(67 MG) BY MOUTH DAILY BEFORE BREAKFAST 90 capsule 2   lisinopril  (ZESTRIL ) 40 MG tablet TAKE 1 TABLET(40 MG) BY MOUTH DAILY 90 tablet 3   meclizine  (ANTIVERT ) 25 MG tablet Take 1 tablet (25 mg total) by mouth 3 (three) times daily as needed for dizziness. 30 tablet 0   mometasone (ELOCON) 0.1 % cream Apply 1 Application topically daily.     omeprazole (PRILOSEC) 20 MG capsule Take 20 mg by mouth daily. Per patient OTC     psyllium (METAMUCIL) 58.6 % powder Take 1 packet by mouth daily.     rosuvastatin  (CRESTOR ) 10 MG tablet Take 1 tablet (10 mg total) by mouth daily. 90 tablet 3   testosterone  cypionate (DEPOTESTOSTERONE CYPIONATE) 200 MG/ML injection Inject 0.5 mLs (100 mg total) into the muscle every 7 (seven) days. 10 mL 0   triamcinolone  (KENALOG ) 0.025 % cream Apply 1 application topically as needed.     vitamin B-12 (CYANOCOBALAMIN ) 100 MCG tablet Take 100 mcg by mouth daily.     diazepam  (VALIUM ) 2 MG tablet Take 1 tablet (2 mg total) by mouth every 8 (eight) hours as needed for anxiety. (Patient not taking: Reported on 02/01/2024) 30 tablet 3   ketorolac  (ACULAR ) 0.5 % ophthalmic solution Place 1 drop into the left eye every 4 (four) hours. (Patient not taking: Reported on 02/01/2024)     topiramate  (TOPAMAX ) 50 MG tablet Take 1 tablet (50 mg total) by mouth 2 (two) times daily. (Patient not taking: Reported on 02/01/2024) 60 tablet 11   No current  facility-administered medications for this visit.    Allergies:   Paroxetine, Morphine  and codeine , Ciprofloxacin , and Flagyl  [metronidazole  hcl]    Social History:  The patient  reports that he quit smoking about 57 years ago. His smoking use included cigarettes. He started smoking about 65 years ago. He has never used smokeless tobacco. He reports current alcohol use. He reports that he does not use drugs.   Family History:  The patient's family history includes Cancer in his father and mother; Heart disease in his father; Squamous cell carcinoma in his mother.    ROS:  Please see the history of present illness.  Otherwise, review of systems are positive for none.   All other systems are reviewed and negative.    PHYSICAL EXAM: VS:  BP 130/72 (BP Location: Left Arm, Patient Position: Sitting, Cuff Size: Large)   Pulse 81   Ht 5' 9 (1.753 m)   Wt 235 lb 6 oz (106.8 kg)   BMI 34.76 kg/m  , BMI Body mass index is 34.76 kg/m. GEN: Well nourished, well developed, in no acute distress  HEENT: normal  Neck: no JVD, carotid bruits, or masses Cardiac: RRR; no murmurs, rubs, or gallops,no edema  Respiratory:  clear to auscultation bilaterally, normal work of breathing GI: soft, nontender, nondistended, + BS MS: no deformity or atrophy  Skin: warm and dry, no rash Neuro:  Strength and sensation are intact Psych: euthymic mood, full affect   EKG:  EKG is ordered today. The ekg ordered today demonstrates :  Normal sinus rhythm Left anterior fascicular block Minimal voltage criteria for LVH, may be normal variant ( Cornell product )      Recent Labs: 09/17/2023: ALT 18; Hemoglobin 14.5; Platelets 235; TSH 0.83 11/23/2023: BUN 16; Creatinine, Ser 1.28; Potassium 4.6; Sodium 140    Lipid Panel    Component Value Date/Time   CHOL 152 09/10/2023 0730   CHOL 126 12/12/2018 0810   TRIG 121.0 09/10/2023 0730   HDL 41.50 09/10/2023 0730   HDL 44 12/12/2018 0810   CHOLHDL 4  09/10/2023 0730   VLDL 24.2 09/10/2023 0730   LDLCALC 87 09/10/2023 0730   LDLCALC 67 12/12/2018 0810   LDLDIRECT 107.6 12/14/2011 0954      Wt Readings from Last 3 Encounters:  02/01/24 235 lb 6 oz (106.8 kg)  12/16/23 233 lb 6 oz (105.9 kg)  11/23/23 231 lb (104.8 kg)      Other studies Reviewed: Additional studies/ records that were reviewed today include: Cardiac CTA, echocardiogram and carotid Doppler. Review of the above records demonstrates: Summarized above     06/01/2017    8:49 AM  PAD Screen  Previous PAD dx? No  Previous surgical procedure? No  Pain with walking? No  Feet/toe relief with dangling? No  Painful, non-healing ulcers? No  Extremities discolored? No      ASSESSMENT AND PLAN:  1.  Coronary artery disease involving native coronary arteries without angina: Known history of moderate nonobstructive coronary artery disease on previous cardiac CTA in 2019 with significantly elevated calcium  score.  He continues to be without anginal symptoms.  I recommend continuing aggressive medical therapy.  Continue low-dose aspirin.  2.  Essential hypertension: Blood pressure is well-controlled on current medications.  3.  Hyperlipidemia: Currently on rosuvastatin  10 mg once daily.  However, most recent lipid profile in January showed an LDL of 87 which is above target.  I elected to increase rosuvastatin  to 40 mg once daily and will repeat lipid and liver profile in 2 months.  4.  PVCs: No significant symptoms related to this.  5.  ILR in place: Followed by EP with no significant arrhythmia noted.     Disposition:   FU with me in 1 year  Signed,  Antionette Kirks, MD  02/01/2024 4:24 PM    [facility]

## 2024-02-01 NOTE — Patient Instructions (Signed)
 Medication Instructions:  INCREASE the Rosuvastatin  to 40 mg once daily *If you need a refill on your cardiac medications before your next appointment, please call your pharmacy*  Lab Work: Your provider would like for you to return in 2 months to have the following labs drawn: fasting Lipid and Liver.   Please go to [facility] [address] ([facility] [address], [place] [address] You do not need an appointment.  They are open from 8 am- 4:30 pm.  Lunch from 1:00 pm- 2:00 pm You will need to be fasting.   You may also go to one of the following [facility]:  [address] [place], [place] [address] Phone: [phone] Lab hours: Mon-Fri 8 am- 5 pm    Lunch 12 pm- 1 pm  [address] [place],  [place]  [address]  [place]  Phone: [phone] Lab hours: 7 am- 4 pm Lunch 12 pm-1 pm   [address] [place],  [place]  [address]  [place]  Phone: [phone] Lab hours: Mon-Fri 8 am- 5 pm    Lunch 12 pm- 1 pm  If you have labs (blood work) drawn today and your tests are completely normal, you will receive your results only by: MyChart Message (if you have MyChart) OR A paper copy in the mail If you have any lab test that is abnormal or we need to change your treatment, we will call you to review the results.  Testing/Procedures: None ordered  Follow-Up: At [facility], you and your health needs are our priority.  As part of our continuing mission to provide you with exceptional heart care, our providers are all part of one team.  This team includes your primary Cardiologist (physician) and Advanced Practice Providers or APPs (Physician Assistants and Nurse Practitioners) who all work together to provide you with the care you need, when you need it.  Your next appointment:   12 month(s)  Provider:   You may see Dr. Alvenia Aus or one of the following Advanced Practice Providers on your designated Care Team:   Laneta Pintos, NP Gildardo Labrador, PA-C Varney Gentleman,  PA-C Cadence Hayti, PA-C Ronald Cockayne, NP Morey Ar, NP    We recommend signing up for the patient portal called "MyChart".  Sign up information is provided on this After Visit Summary.  MyChart is used to connect with patients for Virtual Visits (Telemedicine).  Patients are able to view lab/test results, encounter notes, upcoming appointments, etc.  Non-urgent messages can be sent to your provider as well.   To learn more about what you can do with MyChart, go to [email or web address].

## 2024-02-02 ENCOUNTER — Encounter: Payer: Self-pay | Admitting: Family Medicine

## 2024-02-04 ENCOUNTER — Ambulatory Visit: Payer: Self-pay | Admitting: Cardiology

## 2024-02-07 ENCOUNTER — Encounter (INDEPENDENT_AMBULATORY_CARE_PROVIDER_SITE_OTHER)

## 2024-02-07 ENCOUNTER — Ambulatory Visit (INDEPENDENT_AMBULATORY_CARE_PROVIDER_SITE_OTHER): Admitting: Vascular Surgery

## 2024-02-14 ENCOUNTER — Telehealth: Payer: Self-pay | Admitting: Neurology

## 2024-02-14 NOTE — Telephone Encounter (Signed)
 LVM and sent mychart msg informing pt of need to reschedule 05/29/24 appt - MD out

## 2024-02-14 NOTE — Progress Notes (Signed)
 [facility] Summary Report / Loop Recorder

## 2024-02-17 DIAGNOSIS — H8190 Unspecified disorder of vestibular function, unspecified ear: Secondary | ICD-10-CM | POA: Diagnosis not present

## 2024-02-17 DIAGNOSIS — H903 Sensorineural hearing loss, bilateral: Secondary | ICD-10-CM | POA: Diagnosis not present

## 2024-02-21 ENCOUNTER — Telehealth: Payer: Self-pay | Admitting: Cardiovascular Disease

## 2024-02-21 NOTE — Telephone Encounter (Signed)
 Pt called and B-12 results from 4/01 shared, pt would like to collect a Vit D level at his August 8 lab appt  Dr Avelina raker and secure chat sent

## 2024-02-21 NOTE — Telephone Encounter (Signed)
 Patient is going to have his labs on either August 7th or 8th. Patient was wanting to know if we could add to the lab orders to have the patient check his Vitamin D  levels and Vitamin B12 levels. Please advise.

## 2024-02-28 ENCOUNTER — Ambulatory Visit

## 2024-02-28 DIAGNOSIS — I4891 Unspecified atrial fibrillation: Secondary | ICD-10-CM | POA: Diagnosis not present

## 2024-02-28 LAB — CUP PACEART REMOTE DEVICE CHECK
Date Time Interrogation Session: 20250706233611
Implantable Pulse Generator Implant Date: 20240522

## 2024-02-29 ENCOUNTER — Encounter: Payer: Self-pay | Admitting: Family Medicine

## 2024-02-29 NOTE — Telephone Encounter (Signed)
 Mr. Heinkel is Medicare.  They do not cover the weight loss injections.

## 2024-03-02 ENCOUNTER — Other Ambulatory Visit (HOSPITAL_COMMUNITY): Payer: Self-pay

## 2024-03-02 ENCOUNTER — Ambulatory Visit (INDEPENDENT_AMBULATORY_CARE_PROVIDER_SITE_OTHER): Admitting: Family Medicine

## 2024-03-02 ENCOUNTER — Encounter: Payer: Self-pay | Admitting: Family Medicine

## 2024-03-02 ENCOUNTER — Telehealth: Payer: Self-pay

## 2024-03-02 VITALS — BP 102/60 | HR 104 | Temp 98.2°F | Ht 69.0 in | Wt 237.0 lb

## 2024-03-02 DIAGNOSIS — I2510 Atherosclerotic heart disease of native coronary artery without angina pectoris: Secondary | ICD-10-CM | POA: Diagnosis not present

## 2024-03-02 MED ORDER — SEMAGLUTIDE-WEIGHT MANAGEMENT 0.25 MG/0.5ML ~~LOC~~ SOAJ: 0.2500 mg | SUBCUTANEOUS | 1 refills | Status: DC

## 2024-03-02 NOTE — Assessment & Plan Note (Signed)
 Chronic Increased risk for future cardiovascular events. GLP-1 medication indicated.  Will start Wegovy  0.25 mg weekly with plan to increase after 2 to 4 weeks.   Reviewed medication mechanism of action, possible side effects, patient has no absolute contraindications (no history of thyroid  cancer or pancreatitis).  He does have some constipation mild that he controls with Metamucil.  He will consider using MiraLAX to prevent this worsening.   If he is able to obtain the medication he will make a follow-up appointment 2 to 4 weeks after starting.

## 2024-03-02 NOTE — Progress Notes (Signed)
 Patient ID: Jack Norris, male    DOB: [birth year], 83 y.o.   MRN: [id number]  This visit was conducted in person.  BP 102/60   Pulse (!) 104   Temp 98.2 F (36.8 C) (Temporal)   Ht 5' 9 (1.753 m)   Wt 237 lb (107.5 kg)   SpO2 95%   BMI 35.00 kg/m    CC:  Chief Complaint  Patient presents with   Weight Loss Medication    Subjective:   HPI: Jack VANWYHE is a 83 y.o. male presenting on 03/02/2024 for Weight Loss Medication  Pt with history of  moderate nonobstructive CAD   Reviewed cardiology  Dr. Darron note 610/2025  Cardiac CTA in December 2019 which showed a calcium  score of 976 with extensive LAD plaque causing moderate stenosis and significant stenosis in the right PDA.  CT FFR was negative.    Cardiology has encouraged him to lose weight.  He has had weight increase to BMI 35 in the last year Wt Readings from Last 3 Encounters:  03/02/24 237 lb (107.5 kg)  02/01/24 235 lb 6 oz (106.8 kg)  12/16/23 233 lb 6 oz (105.9 kg)   He has trouble with snacking but has tried low carb diet, regular exercise without benefit. HX of retinal artery occlusion  Hx of prediabetes and CKD stage 3  Lab Results  Component Value Date   HGBA1C 6.1 09/10/2023         Relevant past medical, surgical, family and social history reviewed and updated as indicated. Interim medical history since our last visit reviewed. Allergies and medications reviewed and updated. Outpatient Medications Prior to Visit  Medication Sig Dispense Refill   albuterol  (VENTOLIN  HFA) 108 (90 Base) MCG/ACT inhaler Inhale 2 puffs into the lungs every 6 (six) hours as needed for wheezing or shortness of breath. 8 g 2   amLODipine  (NORVASC ) 10 MG tablet TAKE 1 TABLET BY MOUTH EVERY DAY 90 tablet 3   aspirin EC 325 MG tablet Take 325 mg by mouth daily.     B-D 3CC LUER-LOK SYR 18GX1-1/2 18G X 1-1/2 3 ML MISC USE ONCE A WEEK AS DIRECTED  2   Cholecalciferol (VITAMIN D3) 1.25 MG (50000 UT) CAPS Take 1  capsule (1.25 mg total) by mouth once a week. 12 capsule 0   diazepam  (VALIUM ) 2 MG tablet Take 1 tablet (2 mg total) by mouth every 8 (eight) hours as needed for anxiety. 30 tablet 3   fenofibrate  micronized (LOFIBRA) 67 MG capsule TAKE 1 CAPSULE(67 MG) BY MOUTH DAILY BEFORE BREAKFAST 90 capsule 2   lisinopril  (ZESTRIL ) 40 MG tablet TAKE 1 TABLET(40 MG) BY MOUTH DAILY 90 tablet 3   meclizine  (ANTIVERT ) 25 MG tablet Take 1 tablet (25 mg total) by mouth 3 (three) times daily as needed for dizziness. 30 tablet 0   mometasone (ELOCON) 0.1 % cream Apply 1 Application topically daily.     omeprazole (PRILOSEC) 20 MG capsule Take 20 mg by mouth daily. Per patient OTC     psyllium (METAMUCIL) 58.6 % powder Take 1 packet by mouth daily.     rosuvastatin  (CRESTOR ) 40 MG tablet Take 1 tablet (40 mg total) by mouth daily. 90 tablet 3   testosterone  cypionate (DEPOTESTOSTERONE CYPIONATE) 200 MG/ML injection Inject 0.5 mLs (100 mg total) into the muscle every 7 (seven) days. 10 mL 0   triamcinolone  (KENALOG ) 0.025 % cream Apply 1 application topically as needed.     vitamin B-12 (CYANOCOBALAMIN )  100 MCG tablet Take 100 mcg by mouth daily.     ketorolac  (ACULAR ) 0.5 % ophthalmic solution Place 1 drop into the left eye every 4 (four) hours. (Patient not taking: Reported on 02/01/2024)     topiramate  (TOPAMAX ) 50 MG tablet Take 1 tablet (50 mg total) by mouth 2 (two) times daily. (Patient not taking: Reported on 02/01/2024) 60 tablet 11   No facility-administered medications prior to visit.     Per HPI unless specifically indicated in ROS section below Review of Systems  Constitutional:  Negative for fatigue and fever.  HENT:  Negative for ear pain.   Eyes:  Negative for pain.  Respiratory:  Negative for cough and shortness of breath.   Cardiovascular:  Negative for chest pain, palpitations and leg swelling.  Gastrointestinal:  Negative for abdominal pain.  Genitourinary:  Negative for dysuria.   Musculoskeletal:  Negative for arthralgias.  Neurological:  Negative for syncope, light-headedness and headaches.  Psychiatric/Behavioral:  Negative for dysphoric mood.    Objective:  BP 102/60   Pulse (!) 104   Temp 98.2 F (36.8 C) (Temporal)   Ht 5' 9 (1.753 m)   Wt 237 lb (107.5 kg)   SpO2 95%   BMI 35.00 kg/m   Wt Readings from Last 3 Encounters:  03/02/24 237 lb (107.5 kg)  02/01/24 235 lb 6 oz (106.8 kg)  12/16/23 233 lb 6 oz (105.9 kg)      Physical Exam Vitals reviewed.  Constitutional:      Appearance: He is well-developed.  HENT:     Head: Normocephalic.     Right Ear: Hearing normal.     Left Ear: Hearing normal.     Nose: Nose normal.  Neck:     Thyroid : No thyroid  mass or thyromegaly.     Vascular: No carotid bruit.     Trachea: Trachea normal.  Cardiovascular:     Rate and Rhythm: Normal rate and regular rhythm.     Pulses: Normal pulses.     Heart sounds: Heart sounds not distant. No murmur heard.    No friction rub. No gallop.  Pulmonary:     Effort: Pulmonary effort is normal. No respiratory distress.     Breath sounds: Normal breath sounds.  Musculoskeletal:     Right lower leg: 1+ Edema present.     Left lower leg: 1+ Edema present.  Skin:    General: Skin is warm and dry.     Findings: No rash.  Psychiatric:        Speech: Speech normal.        Behavior: Behavior normal.        Thought Content: Thought content normal.       Results for orders placed or performed in visit on 02/28/24  CUP PACEART REMOTE DEVICE CHECK   Collection Time: 02/27/24 11:36 PM  Result Value Ref Range   Date Time Interrogation Session [id number]    Pulse Generator Manufacturer MERM    Pulse Gen Model LNQ22 LINQ II    Pulse Gen Serial Number [id number]    [facility]    Implantable Pulse Generator Type ICM/ILR    Implantable Pulse Generator Implant Date [id number]     Assessment and Plan  Coronary artery disease involving native  coronary artery of native heart without angina pectoris Assessment & Plan:  Chronic Increased risk for future cardiovascular events. GLP-1 medication indicated.  Will start Wegovy  0.25 mg weekly with plan to increase after 2 to 4  weeks.   Reviewed medication mechanism of action, possible side effects, patient has no absolute contraindications (no history of thyroid  cancer or pancreatitis).  He does have some constipation mild that he controls with Metamucil.  He will consider using MiraLAX to prevent this worsening.   If he is able to obtain the medication he will make a follow-up appointment 2 to 4 weeks after starting.   Other orders -     Semaglutide -Weight Management; Inject 0.25 mg into the skin once a week. Indication coronary artery disease prevention  Dispense: 2 mL; Refill: 1    No follow-ups on file.   Greig Ring, MD

## 2024-03-02 NOTE — Telephone Encounter (Signed)
 Pharmacy Patient Advocate Encounter   Received notification from Onbase that prior authorization for Wegovy  0.25MG /0.5ML auto-injectors is required/requested.   Insurance verification completed.   The patient is insured through [employer] .   Per test claim: PA required; PA submitted to above mentioned insurance via CoverMyMeds Key/confirmation #/EOC [id number] Status is pending

## 2024-03-03 NOTE — Telephone Encounter (Signed)
 Pharmacy Patient Advocate Encounter  Received notification from [employer] that Prior Authorization for Wegovy  0.25MG /0.5ML auto-injectors has been APPROVED from 08/25/23 to 08/23/24   PA #/Case ID/Reference #: [id number]

## 2024-03-09 ENCOUNTER — Encounter: Payer: Self-pay | Admitting: Family Medicine

## 2024-03-10 ENCOUNTER — Encounter: Payer: Self-pay | Admitting: Neurology

## 2024-03-15 NOTE — Progress Notes (Signed)
 [facility] Summary Report / Loop Recorder

## 2024-03-16 ENCOUNTER — Other Ambulatory Visit

## 2024-03-20 ENCOUNTER — Encounter: Payer: Self-pay | Admitting: Cardiovascular Disease

## 2024-03-21 ENCOUNTER — Other Ambulatory Visit: Payer: Self-pay | Admitting: *Deleted

## 2024-03-21 MED ORDER — AMLODIPINE BESYLATE 2.5 MG PO TABS: 2.5000 mg | ORAL_TABLET | Freq: Every day | ORAL | 3 refills | Status: DC

## 2024-03-23 ENCOUNTER — Telehealth: Payer: Self-pay | Admitting: Cardiovascular Disease

## 2024-03-23 NOTE — Telephone Encounter (Signed)
 Follow Up:      Patient says he is out of town and his blood pressure is up. He says anybody can give him a call.     Pt c/o BP issue:  1. What are your last 5 BP readings? 170/90, this morning 160/80 something- never had a blood pressure  2. Are you having any other symptoms (ex. Dizziness, headache, blurred vision, passed out)? Pain left side of  in the area where his spleen is, and also Vertigo-   3. What is your medication issue? High blood pressure and this pain

## 2024-03-23 NOTE — Telephone Encounter (Signed)
 Spoke with patient and he reports his BP readings are normally 120/70's but they are now elevated.  Yesterday he started the lower dose of amlodipine  2.5 mg and reports running 160/80's. He is out of town (returning on Sunday) and he also has been experiencing some swelling. Discussed blood pressure monitoring along with options if it is persistently high. We also scheduled him to come in Monday to be seen here in the office.   Instructed him to monitor BP readings 2 hours after medications, keep a log of those readings, and continue monitoring.  Sent information on BP monitoring through his my chart.   For his swelling to lower legs advised to wear compression hose/socks and to keep feet elevated when sitting.   He verbalized understanding of our conversation, agreement with plan, and had no further questions at this time.   Appointment scheduled for 03/27/24 at 2:45 pm.

## 2024-03-23 NOTE — Telephone Encounter (Signed)
 New Message:     Patient called. He said he had been talking to [facility] via My Chart. He said he would like talk to her please.

## 2024-03-24 ENCOUNTER — Ambulatory Visit: Admitting: Medical

## 2024-03-25 ENCOUNTER — Ambulatory Visit: Payer: Self-pay | Admitting: Cardiology

## 2024-03-27 ENCOUNTER — Ambulatory Visit: Attending: Medical | Admitting: Medical

## 2024-03-27 ENCOUNTER — Encounter: Payer: Self-pay | Admitting: Medical

## 2024-03-27 VITALS — BP 140/74 | HR 86 | Ht 69.0 in | Wt 236.0 lb

## 2024-03-27 DIAGNOSIS — I10 Essential (primary) hypertension: Secondary | ICD-10-CM

## 2024-03-27 DIAGNOSIS — Z95818 Presence of other cardiac implants and grafts: Secondary | ICD-10-CM | POA: Diagnosis not present

## 2024-03-27 DIAGNOSIS — I2510 Atherosclerotic heart disease of native coronary artery without angina pectoris: Secondary | ICD-10-CM | POA: Diagnosis not present

## 2024-03-27 DIAGNOSIS — E7800 Pure hypercholesterolemia, unspecified: Secondary | ICD-10-CM

## 2024-03-27 DIAGNOSIS — I493 Ventricular premature depolarization: Secondary | ICD-10-CM | POA: Diagnosis not present

## 2024-03-27 DIAGNOSIS — R600 Localized edema: Secondary | ICD-10-CM

## 2024-03-27 MED ORDER — CARVEDILOL 3.125 MG PO TABS: 3.1250 mg | ORAL_TABLET | Freq: Two times a day (BID) | ORAL | 3 refills | Status: DC

## 2024-03-27 NOTE — Progress Notes (Unsigned)
 Cardiology Office Note   Date:  03/28/2024  ID:  Jack Norris, DOB 1940-12-29, MRN [id number] PCP: Avelina Greig BRAVO, MD  [facility] Providers Cardiologist:  Deatrice Cage, MD   History of Present Illness Jack Norris is a 83 y.o. male with a h/o HLD, nonobstructive CAD, HTN, HLD, retinal artery occlusion s/p ILR placement and PVCs who is for follow-up of lower leg edema.  Cardiac CTA in December 2019 showed a calcium  score of 976 with extensive LAD plaque causing moderate stenosis and significant stenosis in the right PDA. CT FFR was negative.   Carotid dopplers in 09/2022 showed no significant disease. Echo showed an EF of 50-55% with no significant valvular abnormalities.   The patient was last seen 01/2024 reporting vertigo. Crestor  was increased.   Today,  The patient reports he has been on amlodipine  for a couple months. He reports lower leg edema that has gotten worse. He had an episode of vertigo and lower leg edema. Amlodipine  was reduced from 10 to 2.5mg  daily. Swelling has improved, but not back to baseline. He denies chest pain, SOB, palpitations, heart racing.  Studies Reviewed EKG Interpretation Date/Time:  Monday March 27 2024 14:54:48 EDT Ventricular Rate:  86 PR Interval:  140 QRS Duration:  110 QT Interval:  362 QTC Calculation: 433 R Axis:   -65  Text Interpretation: Normal sinus rhythm Incomplete right bundle branch block Left anterior fascicular block When compared with ECG of 01-Feb-2024 16:15, No significant change was found Confirmed by Franchester, Latonja Bobeck ([id number]) on 03/27/2024 2:58:24 PM    Echo 09/2022  1. Left ventricular ejection fraction, by estimation, is 50 to 55%. The  left ventricle has low normal function. The left ventricle has no regional  wall motion abnormalities. Left ventricular diastolic parameters are  consistent with Grade I diastolic  dysfunction (impaired relaxation).   2. Right ventricular systolic function is normal. The  right ventricular  size is normal. Tricuspid regurgitation signal is inadequate for assessing  PA pressure.   3. The mitral valve is normal in structure. Trivial mitral valve  regurgitation. No evidence of mitral stenosis.   4. The aortic valve is tricuspid. Aortic valve regurgitation is trivial.  No aortic stenosis is present.   5. The inferior vena cava is normal in size with greater than 50%  respiratory variability, suggesting right atrial pressure of 3 mmHg.   Comparison(s): No prior Echocardiogram.   Cardiac CTA 07/2018 IMPRESSION: 1. Coronary artery calcium  score 976 Agatston units, placing the patient in the 75th percentile for age and gender, suggesting intermediate risk for future cardiac events.   2. Suspect severe (70-90%) stenosis in the mid PDA, this is a moderate-sized vessel.   3.  Extensive LAD plaque, suspect up to 50% mid LAD stenosis.   Will send for FFR to assess hemodynamic significance of disease.   IMPRESSION: 1. No evidence for hemodynamically significant stenosis in the RCA system.   2. FFR < 0.8 in the distal LAD but no evidence for a discrete stenosis and FFR 0.87 in the mid LAD after a known area of stenosis. Difficult to know what to make of distal LAD finding.      Physical Exam VS:  BP (!) 140/74 (BP Location: Left Arm, Patient Position: Sitting, Cuff Size: Normal)   Pulse 86   Ht 5' 9 (1.753 m)   Wt 236 lb (107 kg)   SpO2 97%   BMI 34.85 kg/m        Wt Readings  from Last 3 Encounters:  03/27/24 236 lb (107 kg)  03/02/24 237 lb (107.5 kg)  02/01/24 235 lb 6 oz (106.8 kg)    GEN: Well nourished, well developed in no acute distress NECK: No JVD; No carotid bruits CARDIAC: RRR, no murmurs, rubs, gallops RESPIRATORY:  Clear to auscultation without rales, wheezing or rhonchi  ABDOMEN: Soft, non-tender, non-distended EXTREMITIES:  mild lower leg edema; No deformity   ASSESSMENT AND PLAN  HTN Patient started on amlodipine  a couple  months ago and stated experiencing worsening lower leg edema. Amlodipine  was decreased down to 2.5mg  daily with improvement of swelling. He has mild lower leg edema today on exam. BP is Ok 140/74. He has been taking lisinopril  40mg  in the AM and low dose amlodipine  in the evening. I will stop amlodipine . I will start Coreg  3.125mg BID. if swelling is persistent at follow-up may need low dose diuretic. Also discussed low salt diet, leg elevation and compression socks. Monitor EKG given LAFB and irBBB.   Lower leg edema Suspected 2/2 amlodipine . But also has diastolic dysfunction. Will update an echocardiogram.   CAD He denies anginal sympotms. He has known moderate nonobstructive CAD.   HLD LDL 87. Continue Crestor  40mg  daily.   PVCs No significant symptoms.   ILR Followed by EP with no arrhythmia noted.        Dispo: Follow-up in 2 weeks  Signed, Deane Melick VEAR Fishman, PA-C

## 2024-03-27 NOTE — Patient Instructions (Signed)
 Medication Instructions:  Your physician recommends the following medication changes.  STOP TAKING: Amlodipine    START TAKING: Carvedilol  3.125 mg by mouth twice a day    *If you need a refill on your cardiac medications before your next appointment, please call your pharmacy*  Lab Work: No labs ordered today    Testing/Procedures: Your physician has requested that you have an echocardiogram. Echocardiography is a painless test that uses sound waves to create images of your heart. It provides your doctor with information about the size and shape of your heart and how well your heart's chambers and valves are working.   You may receive an ultrasound enhancing agent through an IV if needed to better visualize your heart during the echo. This procedure takes approximately one hour.  There are no restrictions for this procedure.  This will take place at [address] [facility] [address] [facility], [place] [address]  Please note: We ask at that you not bring children with you during ultrasound (echo/ vascular) testing. Due to room size and safety concerns, children are not allowed in the ultrasound rooms during exams. Our front office staff cannot provide observation of children in our lobby area while testing is being conducted. An adult accompanying a patient to their appointment will only be allowed in the ultrasound room at the discretion of the ultrasound technician under special circumstances. We apologize for any inconvenience.   Follow-Up: At [facility], you and your health needs are our priority.  As part of our continuing mission to provide you with exceptional heart care, our providers are all part of one team.  This team includes your primary Cardiologist (physician) and Advanced Practice Providers or APPs (Physician Assistants and Nurse Practitioners) who all work together to provide you with the care you need, when you need it.  Your next appointment:   April 13, 2024 @ 8 am  Provider:   Barnie Hila, NP

## 2024-03-30 ENCOUNTER — Ambulatory Visit: Payer: Self-pay | Admitting: Cardiology

## 2024-03-30 ENCOUNTER — Ambulatory Visit

## 2024-03-30 DIAGNOSIS — I4891 Unspecified atrial fibrillation: Secondary | ICD-10-CM | POA: Diagnosis not present

## 2024-03-30 DIAGNOSIS — G4733 Obstructive sleep apnea (adult) (pediatric): Secondary | ICD-10-CM | POA: Diagnosis not present

## 2024-03-30 DIAGNOSIS — H353122 Nonexudative age-related macular degeneration, left eye, intermediate dry stage: Secondary | ICD-10-CM | POA: Diagnosis not present

## 2024-03-30 DIAGNOSIS — H35352 Cystoid macular degeneration, left eye: Secondary | ICD-10-CM | POA: Diagnosis not present

## 2024-03-30 DIAGNOSIS — H35371 Puckering of macula, right eye: Secondary | ICD-10-CM | POA: Diagnosis not present

## 2024-03-30 DIAGNOSIS — H34232 Retinal artery branch occlusion, left eye: Secondary | ICD-10-CM | POA: Diagnosis not present

## 2024-03-30 DIAGNOSIS — H43811 Vitreous degeneration, right eye: Secondary | ICD-10-CM | POA: Diagnosis not present

## 2024-03-30 DIAGNOSIS — H34832 Tributary (branch) retinal vein occlusion, left eye, with macular edema: Secondary | ICD-10-CM | POA: Diagnosis not present

## 2024-03-30 LAB — CUP PACEART REMOTE DEVICE CHECK
Date Time Interrogation Session: 20250806232758
Implantable Pulse Generator Implant Date: 20240522

## 2024-03-31 ENCOUNTER — Other Ambulatory Visit: Payer: Self-pay | Admitting: *Deleted

## 2024-03-31 ENCOUNTER — Other Ambulatory Visit: Payer: Self-pay

## 2024-03-31 DIAGNOSIS — I10 Essential (primary) hypertension: Secondary | ICD-10-CM | POA: Diagnosis not present

## 2024-03-31 DIAGNOSIS — E7800 Pure hypercholesterolemia, unspecified: Secondary | ICD-10-CM

## 2024-03-31 DIAGNOSIS — I4891 Unspecified atrial fibrillation: Secondary | ICD-10-CM | POA: Diagnosis not present

## 2024-04-01 LAB — LIPID PANEL
Chol/HDL Ratio: 3.3 ratio (ref 0.0–5.0)
Cholesterol, Total: 108 mg/dL (ref 100–199)
HDL: 33 mg/dL — ABNORMAL LOW (ref >39)
LDL Chol Calc (NIH): 57 mg/dL (ref 0–99)
Triglycerides: 93 mg/dL (ref 0–149)
VLDL Cholesterol Cal: 18 mg/dL (ref 5–40)

## 2024-04-01 LAB — HEPATIC FUNCTION PANEL
ALT: 16 IU/L (ref 0–44)
AST: 17 IU/L (ref 0–40)
Albumin: 4.3 g/dL (ref 3.7–4.7)
Alkaline Phosphatase: 100 IU/L (ref 44–121)
Bilirubin Total: 0.6 mg/dL (ref 0.0–1.2)
Bilirubin, Direct: 0.23 mg/dL (ref 0.00–0.40)
Total Protein: 6.5 g/dL (ref 6.0–8.5)

## 2024-04-04 ENCOUNTER — Ambulatory Visit: Payer: Self-pay | Admitting: Cardiovascular Disease

## 2024-04-08 LAB — VITAMIN D 1,25 DIHYDROXY
Vitamin D 1, 25 (OH)2 Total: 70 pg/mL — ABNORMAL HIGH
Vitamin D2 1, 25 (OH)2: <10 pg/mL
Vitamin D3 1, 25 (OH)2: 66 pg/mL

## 2024-04-08 LAB — VITAMIN B12: Vitamin B-12: 1296 pg/mL — ABNORMAL HIGH (ref 232–1245)

## 2024-04-09 NOTE — Progress Notes (Deleted)
   Cardiology Clinic Note   Date: 04/09/2024 ID: SHALIN VONBARGEN, DOB 1941-07-07, MRN [id number]  Primary Cardiologist:  Deatrice Cage, MD  Chief Complaint   Jack Norris is a 83 y.o. male who presents to the clinic today for ***  Patient Profile   Jack Norris is followed by Dr. Cage for the history outlined below.      Past medical history significant for: CAD. Carotid artery stenosis. PAD. Hypertension. Hyperlipidemia. CAD. GERD. Retinal artery occlusion of the left eye. CKD stage III.  In summary, ***  Patient was last seen in the office by ***     History of Present Illness    Today, patient ***  ***  ROS: All other systems reviewed and are otherwise negative except as noted in History of Present Illness.  EKGs/Labs Reviewed        11/23/2023: BUN 16; Creatinine, Ser 1.28; Potassium 4.6; Sodium 140 03/31/2024: ALT 16; AST 17   09/17/2023: Hemoglobin 14.5; WBC 12.7   09/17/2023: TSH 0.83   No results found for requested labs within last 365 days.  ***  Risk Assessment/Calculations    {Does this patient have ATRIAL FIBRILLATION?:[id number]} No BP recorded.  {Refresh Note OR Click here to enter BP  :1}***        Physical Exam    VS:  There were no vitals taken for this visit. , BMI There is no height or weight on file to calculate BMI.  GEN: Well nourished, well developed, in no acute distress. Neck: No JVD or carotid bruits. Cardiac: *** RRR. *** No murmur. No rubs or gallops.   Respiratory:  Respirations regular and unlabored. Clear to auscultation without rales, wheezing or rhonchi. GI: Soft, nontender, nondistended. Extremities: Radials/DP/PT 2+ and equal bilaterally. No clubbing or cyanosis. No edema ***  Skin: Warm and dry, no rash. Neuro: Strength intact.  Assessment & Plan   ***  Disposition: ***     {Are you ordering a CV Procedure (e.g. stress test, cath, DCCV, TEE, etc)?   Press F2        :[id number]}    Signed, Barnie HERO. Drevon Plog, DNP, NP-C

## 2024-04-10 ENCOUNTER — Telehealth: Payer: Self-pay | Admitting: Emergency Medicine

## 2024-04-10 ENCOUNTER — Ambulatory Visit: Payer: Self-pay | Admitting: Cardiovascular Disease

## 2024-04-10 NOTE — Telephone Encounter (Signed)
-----   Message from Barnie Hila sent at 04/09/2024 10:44 AM EDT ----- Erskin Schuller,   This patient is scheduled for Thursday, 8/21. His echo is scheduled for 8/26. Will you please reschedule him for after his echo? It looks like I have openings on 8/28 and 8/29.    Thank you!  DW

## 2024-04-10 NOTE — Telephone Encounter (Signed)
 Patient is rescheduled for 04/21/24 after telephone conversation.

## 2024-04-10 NOTE — Telephone Encounter (Signed)
-----   Message from Barnie Hila sent at 04/09/2024 10:44 AM EDT ----- Jack Norris,   This patient is scheduled for Thursday, 8/21. His echo is scheduled for 8/26. Will you please reschedule him for after his echo? It looks like I have openings on 8/28 and 8/29.    Thank you!  DW

## 2024-04-10 NOTE — Telephone Encounter (Signed)
 Called and spoke with the patient after the patient returned an earlier call.  Patient's appointment has been rescheduled to 04/21/24 after the ECHO.

## 2024-04-10 NOTE — Telephone Encounter (Signed)
 Called the patient to reschedule an upcoming appointment. There was no answer, message left, with call back number provided.  Following MyChart message also sent.  Good morning Mr. Archey.  I tried to call you and speak to you in person, and ended up leaving a voicemail for you call us  back.  The reason for the call was because I wanted to reschedule your upcoming appointment with Barnie Hila, NP on 04/13/24, Thursday.  You have an ECHO scheduled for 04/18/24, and ideally it would be better to have an office visit with Barnie after the ECHO is done and Barnie has had a chance to review the results.  So, if you get a chance, please call me back at our office number [phone], and ask for [facility].  Barnie has a couple of opening on 8/28 and 8/29 and we can reschedule to one of those slots.  Thank you and look forward to hearing from you.   Abby, RN

## 2024-04-12 ENCOUNTER — Other Ambulatory Visit: Payer: Self-pay | Admitting: Physician Assistant

## 2024-04-13 ENCOUNTER — Ambulatory Visit: Admitting: Student

## 2024-04-14 DIAGNOSIS — E291 Testicular hypofunction: Secondary | ICD-10-CM | POA: Diagnosis not present

## 2024-04-14 DIAGNOSIS — R350 Frequency of micturition: Secondary | ICD-10-CM | POA: Diagnosis not present

## 2024-04-14 DIAGNOSIS — N401 Enlarged prostate with lower urinary tract symptoms: Secondary | ICD-10-CM | POA: Diagnosis not present

## 2024-04-18 ENCOUNTER — Ambulatory Visit: Attending: Medical

## 2024-04-18 DIAGNOSIS — R350 Frequency of micturition: Secondary | ICD-10-CM | POA: Diagnosis not present

## 2024-04-18 DIAGNOSIS — R600 Localized edema: Secondary | ICD-10-CM

## 2024-04-18 DIAGNOSIS — E291 Testicular hypofunction: Secondary | ICD-10-CM | POA: Diagnosis not present

## 2024-04-18 DIAGNOSIS — N401 Enlarged prostate with lower urinary tract symptoms: Secondary | ICD-10-CM | POA: Diagnosis not present

## 2024-04-18 LAB — ECHOCARDIOGRAM COMPLETE
AR max vel: 2.94 cm2
AV Area VTI: 2.83 cm2
AV Area mean vel: 2.85 cm2
AV Mean grad: 5 mmHg
AV Peak grad: 9.9 mmHg
Ao pk vel: 1.57 m/s
Area-P 1/2: 2.86 cm2
MV VTI: 3.58 cm2
S' Lateral: 3.8 cm
Single Plane A4C EF: 50.3 %

## 2024-04-18 NOTE — Progress Notes (Unsigned)
 Cardiology Clinic Note   Date: 04/21/2024 ID: Jack Norris, DOB 1941-07-19, MRN [id number]  Primary Cardiologist:  Deatrice Cage, MD  Chief Complaint   Jack Norris is a 83 y.o. male who presents to the clinic today for follow up after medications changes.   Patient Profile   Jack Norris is followed by Dr. Cage for the history outlined below.      Past medical history significant for: CAD. Coronary CTA with FFR 07/28/2018: Coronary calcium  score 976 (75th percentile).  Suspect severe stenosis mid PDA, extensive LAD plaque.  No evidence of hemodynamically significant stenosis in the RCA per FFR.  FFR of the distal LAD <0.8 but no evidence for discrete stenosis and FFR 0.87 in the mid LAD after known area stenosis. Lower extremity edema. Echo 04/18/2024: EF 55 to 60%.  No RWMA.  Grade 1 DD.  Normal RV size/function.  Mild MR/AI.  Mild dilatation of aortic root and ascending aorta 39 mm. PAD. Hypertension. Hyperlipidemia. Lipid panel 03/31/2024: LDL 57, HDL 33, TG 93, total 108. CAD. GERD. Retinal artery occlusion of the left eye. CKD stage III. Branch retinal artery occlusion. Loop recorder implantation 01/13/2023. Remote device check 03/29/2024: Battery status okay.  Normal device function.  No arrhythmias seen, normal histograms.  In summary, patient was previously followed by Dr. Dann who he establish care with in October 2018.  He reported a family history of CAD in father who underwent CABG x 4 (he had been a chronic smoker).  He underwent exercise stress test for risk stratification which demonstrated normal exercise tolerance, hypertensive response to exercise, frequent PVCs during stress and occasional PVCs in recovery, no ischemia.  He had a coronary CTA with FFR in December 2019 which showed a calcium  score of 976 and moderate CAD found to be insignificant by FFR.  In early 2024 patient was found to have branch retinal artery occlusion on routine eye exam  with no visual symptoms.  Dr. Dann recommended echo and carotid ultrasound.  Echo showed EF 50 to 55%, no RWMA, Grade I DD, normal RV size/function, trivial MR.  Carotid artery ultrasound was normal.  Outpatient cardiac monitoring was ordered to evaluate for possible A-fib.  30-day cardiac monitor demonstrated NSR with rare PACs/PVCs and no A-fib.  He was evaluated by Dr. Inocencio in May 2024 and loop recorder was implanted.  Patient establish care with Dr. Cloria on 02/01/2024.  He had no cardiac complaints at that time.  His biggest concern was vertigo for which he had extensive workup by neurology and ENT.  His LDL was noted to be above target and rosuvastatin  was increased.  Patient was last seen in the office by Cadence Furth, PA-C on 03/27/2024 for lower extremity edema.  He reported being on amlodipine  for a couple of months and noted to have worsening lower extremity edema as well as an episode of vertigo.  Amlodipine  was reduced from 10 mg to 2.5 mg and edema improved.  He denied chest pain, shortness of breath, palpitations.  BP was elevated at 140/74.  Amlodipine  was stopped and he was started on carvedilol  3.125 mg twice daily.  He was instructed to follow a low-sodium diet and manage extremity edema with elevation and compression socks.  Echo was updated and demonstrated normal LV/RV function as detailed above.     History of Present Illness    Today, patient is accompanied by his wife. He reports further improvement in lower extremity edema but still present. Patient denies shortness  of breath at rest,  orthopnea or PND. His wife states she notices he gets winded with walking. Patient states he does not notice. No chest pain, pressure, or tightness. No palpitations.  He is most concerned about his BP. He felt it was better controlled when he was taking amlodipine  with SBP averaging in the 120s. Since stopping amlodipine  and starting carvedilol  SBP averaging in the 140s. He does  not add salt to  his food. His wife shares that he eats a lot of potato chips. He is motivated to lose weight and has been a long time member of weight watchers though he admits he has not been following the plan for a while. Discussed results of echo in detail. All questions answered.     ROS: All other systems reviewed and are otherwise negative except as noted in History of Present Illness.  EKGs/Labs Reviewed       EKG is not ordered today.   11/23/2023: BUN 16; Creatinine, Ser 1.28; Potassium 4.6; Sodium 140 03/31/2024: ALT 16; AST 17   09/17/2023: Hemoglobin 14.5; WBC 12.7   09/17/2023: TSH 0.83    Risk Assessment/Calculations      HYPERTENSION CONTROL Vitals:   04/21/24 1020 04/21/24 1227  BP: (!) 152/88 (!) 150/86    The patient's blood pressure is elevated above target today.  In order to address the patient's elevated BP: A current anti-hypertensive medication was adjusted today.           Physical Exam    VS:  BP (!) 150/86 (BP Location: Left Arm, Patient Position: Sitting, Cuff Size: Large)   Resp 19   Ht 5' 9 (1.753 m)   Wt 235 lb (106.6 kg)   SpO2 95%   BMI 34.70 kg/m  , BMI Body mass index is 34.7 kg/m.  GEN: Well nourished, well developed, in no acute distress. Neck: No JVD or carotid bruits. Cardiac:  RRR.  No murmur. No rubs or gallops.   Respiratory:  Respirations regular and unlabored. Clear to auscultation without rales, wheezing or rhonchi. GI: Soft, nontender, nondistended. Extremities: Radials/DP/PT 2+ and equal bilaterally. No clubbing or cyanosis. Mild, nonpitting lower extremity edema.   Skin: Warm and dry, no rash. Neuro: Strength intact.  Assessment & Plan   CAD Coronary CTA with FFR December 2019 showed coronary calcium  score of 976 with moderate stenosis found to be insignificant by FFR.  Patient denies chest pain, pressure or tightness. He is not as active as he used to be. Discussed the nature of deconditioning with the patient.  - Increase  physical activity as tolerated.  - Continue aspirin, rosuvastatin , carvedilol .  Lower extremity edema Echo August 2025 demonstrated normal LV/RV function, Grade I DD, mild MR/AI, mild dilatation of aortic root and ascending aorta 39 mm.  Patient reports lower extremity edema is improved but still present. He does eat salty snacks daily. He does not add salt to his foods. He eats out on occasion. Mild nonpitting lower extremity edema today. He would like to continue more conservative management of edema for now. He will wear compression socks as much as possible and elevate his legs. If lower extremity edema persists low dose Lasix can be considered.  - Manage with compression as much as possible and elevation of legs.   Hypertension BP today 152/88 on intake and 150/86 on my recheck. During visit patient began having an episode of vertigo. Home SBP averaging in the 140s.  - Continue lisinopril . - Increase carvedilol  to 6.25 mg bid.  Branch retinal artery occlusion Loop recorder implantation May 2024.  Remote device check August 2025 showed normal device function with no arrhythmias.  Patient has dizziness from vertigo. No presyncope or syncope reported.  - Continue aspirin, rosuvastatin .  Hyperlipidemia LDL 57 August 2025, at goal. - Continue rosuvastatin .  Disposition: Increase carvedilol  6.25 mg bid. Return in 2 months or sooner as needed.          Signed, Barnie HERO. Lovis More, DNP, NP-C

## 2024-04-20 ENCOUNTER — Ambulatory Visit: Payer: Self-pay | Admitting: Medical

## 2024-04-20 ENCOUNTER — Ambulatory Visit: Admitting: Student

## 2024-04-21 ENCOUNTER — Ambulatory Visit: Attending: Student | Admitting: Student

## 2024-04-21 ENCOUNTER — Encounter: Payer: Self-pay | Admitting: Student

## 2024-04-21 VITALS — BP 150/86 | Resp 19 | Ht 69.0 in | Wt 235.0 lb

## 2024-04-21 DIAGNOSIS — R600 Localized edema: Secondary | ICD-10-CM | POA: Diagnosis not present

## 2024-04-21 DIAGNOSIS — I10 Essential (primary) hypertension: Secondary | ICD-10-CM

## 2024-04-21 DIAGNOSIS — I2510 Atherosclerotic heart disease of native coronary artery without angina pectoris: Secondary | ICD-10-CM | POA: Diagnosis not present

## 2024-04-21 DIAGNOSIS — H34232 Retinal artery branch occlusion, left eye: Secondary | ICD-10-CM | POA: Diagnosis not present

## 2024-04-21 DIAGNOSIS — E785 Hyperlipidemia, unspecified: Secondary | ICD-10-CM | POA: Diagnosis not present

## 2024-04-21 MED ORDER — CARVEDILOL 6.25 MG PO TABS
6.2500 mg | ORAL_TABLET | Freq: Two times a day (BID) | ORAL | 3 refills | Status: DC
Start: 2024-04-21 — End: 2024-06-21

## 2024-04-21 NOTE — Patient Instructions (Signed)
 Medication Instructions:  Your physician recommends the following medication changes.  INCREASE: Carvedilol  to 6.25 mg two times daily.   *If you need a refill on your cardiac medications before your next appointment, please call your pharmacy*  Lab Work: None ordered at this time  If you have labs (blood work) drawn today and your tests are completely normal, you will receive your results only by: MyChart Message (if you have MyChart) OR A paper copy in the mail If you have any lab test that is abnormal or we need to change your treatment, we will call you to review the results.  Testing/Procedures: None ordered at this time   Follow-Up: At [facility], you and your health needs are our priority.  As part of our continuing mission to provide you with exceptional heart care, our providers are all part of one team.  This team includes your primary Cardiologist (physician) and Advanced Practice Providers or APPs (Physician Assistants and Nurse Practitioners) who all work together to provide you with the care you need, when you need it.  Your next appointment:   2 month(s)  Provider:   Deatrice Cage, MD or Barnie Hila, NP    We recommend signing up for the patient portal called MyChart.  Sign up information is provided on this After Visit Summary.  MyChart is used to connect with patients for Virtual Visits (Telemedicine).  Patients are able to view lab/test results, encounter notes, upcoming appointments, etc.  Non-urgent messages can be sent to your provider as well.   To learn more about what you can do with MyChart, go to [email or web address].   Other Instructions  Avoid alcohol, caffeine, and exercise within 30 minutes before checking blood pressure. Sit still, with feet flat on the floor, in a straight backed, supportive chair.  Rest for 5 minutes.  Support arm on flat surface at heart level. Bottom of cuff should be placed directly above the bend of the  elbow. Take multiple readings.  Write down and bring to all follow up appointments.  Bring cuff to clinic periodically for accuracy comparison.

## 2024-05-01 ENCOUNTER — Ambulatory Visit

## 2024-05-01 ENCOUNTER — Ambulatory Visit: Payer: Self-pay | Admitting: Cardiology

## 2024-05-01 DIAGNOSIS — I4891 Unspecified atrial fibrillation: Secondary | ICD-10-CM

## 2024-05-01 LAB — CUP PACEART REMOTE DEVICE CHECK
Date Time Interrogation Session: 20250906232056
Implantable Pulse Generator Implant Date: 20240522

## 2024-05-02 DIAGNOSIS — Z872 Personal history of diseases of the skin and subcutaneous tissue: Secondary | ICD-10-CM | POA: Diagnosis not present

## 2024-05-02 DIAGNOSIS — L578 Other skin changes due to chronic exposure to nonionizing radiation: Secondary | ICD-10-CM | POA: Diagnosis not present

## 2024-05-02 DIAGNOSIS — Z86018 Personal history of other benign neoplasm: Secondary | ICD-10-CM | POA: Diagnosis not present

## 2024-05-02 DIAGNOSIS — B351 Tinea unguium: Secondary | ICD-10-CM | POA: Diagnosis not present

## 2024-05-02 DIAGNOSIS — Q801 X-linked ichthyosis: Secondary | ICD-10-CM | POA: Diagnosis not present

## 2024-05-02 DIAGNOSIS — B353 Tinea pedis: Secondary | ICD-10-CM | POA: Diagnosis not present

## 2024-05-02 DIAGNOSIS — Z859 Personal history of malignant neoplasm, unspecified: Secondary | ICD-10-CM | POA: Diagnosis not present

## 2024-05-02 DIAGNOSIS — Z85828 Personal history of other malignant neoplasm of skin: Secondary | ICD-10-CM | POA: Diagnosis not present

## 2024-05-02 DIAGNOSIS — L300 Nummular dermatitis: Secondary | ICD-10-CM | POA: Diagnosis not present

## 2024-05-11 NOTE — Progress Notes (Signed)
 Remote Loop Recorder Transmission

## 2024-05-12 ENCOUNTER — Encounter: Payer: Self-pay | Admitting: Cardiovascular Disease

## 2024-05-17 DIAGNOSIS — I10 Essential (primary) hypertension: Secondary | ICD-10-CM | POA: Diagnosis not present

## 2024-05-17 DIAGNOSIS — I452 Bifascicular block: Secondary | ICD-10-CM | POA: Diagnosis not present

## 2024-05-17 DIAGNOSIS — R079 Chest pain, unspecified: Secondary | ICD-10-CM | POA: Diagnosis not present

## 2024-05-18 DIAGNOSIS — I10 Essential (primary) hypertension: Secondary | ICD-10-CM | POA: Diagnosis not present

## 2024-05-20 NOTE — Progress Notes (Unsigned)
 Cardiology Clinic Note   Date: 05/22/2024 ID: Jack Norris, DOB [birth year], MRN [id number]  Primary Cardiologist:  Deatrice Cage, MD  Chief Complaint   Jack Norris is a 83 y.o. male who presents to the clinic today for hospital follow up.   Patient Profile   Jack Norris is followed by Dr. Cage for the history outlined below.       Past medical history significant for: CAD. Coronary CTA with FFR 07/28/2018: Coronary calcium  score 976 (75th percentile).  Suspect severe stenosis mid PDA, extensive LAD plaque.  No evidence of hemodynamically significant stenosis in the RCA per FFR.  FFR of the distal LAD <0.8 but no evidence for discrete stenosis and FFR 0.87 in the mid LAD after known area stenosis. Lower extremity edema. Echo 04/18/2024: EF 55 to 60%.  No RWMA.  Grade 1 DD.  Normal RV size/function.  Mild MR/AI.  Mild dilatation of aortic root and ascending aorta 39 mm. PAD. Hypertension. Hyperlipidemia. Lipid panel 03/31/2024: LDL 57, HDL 33, TG 93, total 108. CAD. GERD. Retinal artery occlusion of the left eye. CKD stage III. Branch retinal artery occlusion. Loop recorder implantation 01/13/2023. Remote device check 03/29/2024: Battery status okay.  Normal device function.  No arrhythmias seen, normal histograms.  In summary, patient was previously followed by Dr. Dann who he establish care with in October 2018.  He reported a family history of CAD in father who underwent CABG x 4 (he had been a chronic smoker).  He underwent exercise stress test for risk stratification which demonstrated normal exercise tolerance, hypertensive response to exercise, frequent PVCs during stress and occasional PVCs in recovery, no ischemia.  He had a coronary CTA with FFR in December 2019 which showed a calcium  score of 976 and moderate CAD found to be insignificant by FFR.  In early 2024 patient was found to have branch retinal artery occlusion on routine eye exam with no visual  symptoms.  Dr. Dann recommended echo and carotid ultrasound.  Echo showed EF 50 to 55%, no RWMA, Grade I DD, normal RV size/function, trivial MR.  Carotid artery ultrasound was normal.  Outpatient cardiac monitoring was ordered to evaluate for possible A-fib.  30-day cardiac monitor demonstrated NSR with rare PACs/PVCs and no A-fib.  He was evaluated by Dr. Inocencio in May 2024 and loop recorder was implanted.  Patient establish care with Dr. Cloria on 02/01/2024.  He had no cardiac complaints at that time.  His biggest concern was vertigo for which he had extensive workup by neurology and ENT.  His LDL was noted to be above target and rosuvastatin  was increased.  Patient was seen in the office on 03/27/2024 for lower extremity edema.  He reported being on amlodipine  for a couple of months and noted to have worsening lower extremity edema as well as an episode of vertigo.  Amlodipine  was reduced from 10 mg to 2.5 mg and edema improved.  He denied chest pain, shortness of breath, palpitations.  BP was elevated at 140/74.  Amlodipine  was stopped and he was started on carvedilol  3.125 mg twice daily.  He was instructed to follow a low-sodium diet and manage extremity edema with elevation and compression socks.  Echo was updated and demonstrated normal LV/RV function as detailed above.   Patient was last seen in the office by me on 04/21/2024 for follow-up after medication changes.  Patient reported improvement in lower extremity edema.  He denied shortness of breath however his wife reported she notes  he gets winded with walking.  Patient felt that his blood pressure was better controlled on amlodipine  with SBP averaging in the 120s.  Since stopping amlodipine  and starting carvedilol  SBP averaging in the 140s.  Carvedilol  was increased to 6.25 twice daily.  Patient presented to the ED on 05/17/2024 while traveling in [place] .  He reported elevated BP, headache x 3 weeks, and shortness of breath for several days.   Patient reported SBP 140-170s at home.  He reported progressive mild dyspnea with exertion over 6 weeks.  No lower extremity edema.  He endorsed increased sodium intake during trip.  BP 179/94.  He was euvolemic on exam.  Workup was unremarkable.  Patient was discharged.     History of Present Illness    Today, patient is accompanied by his wife. He reports BP has been in the 140-150s at home. When he was in [place]  BP was running 150-160s and he was experiencing lightheadedness. He does endorse increased sodium intake while in [place] . He decided to be evaluated in the ED secondary to a mild but persistent headache for several weeks. He still has the headache. He is willing to re-challenge low dose amlodipine  to see if he can get better BP control. His biggest concern today is DOE. He feels more dyspneic with less exertion.    ROS: All other systems reviewed and are otherwise negative except as noted in History of Present Illness.  EKGs/Labs Reviewed       EKG not performed today.   11/23/2023: BUN 16; Creatinine, Ser 1.28; Potassium 4.6; Sodium 140 03/31/2024: ALT 16; AST 17   09/17/2023: Hemoglobin 14.5; WBC 12.7   09/17/2023: TSH 0.83    Physical Exam    VS:  BP 138/78 (BP Location: Left Arm, Patient Position: Sitting, Cuff Size: Normal)   Pulse 72   Resp 20   Ht 5' 9 (1.753 m)   Wt 233 lb (105.7 kg)   SpO2 97%   BMI 34.41 kg/m  , BMI Body mass index is 34.41 kg/m.  GEN: Well nourished, well developed, in no acute distress. Neck: No JVD or carotid bruits. Cardiac:  RRR.  No murmur. No rubs or gallops.   Respiratory:  Respirations regular and unlabored. Clear to auscultation without rales, wheezing or rhonchi. GI: Soft, nontender, nondistended. Extremities: Radials/DP/PT 2+ and equal bilaterally. No clubbing or cyanosis. No edema  Skin: Warm and dry, no rash. Neuro: Strength intact.  Assessment & Plan   CAD/DOE/Angina Coronary CTA with FFR December 2019 showed coronary  calcium  score of 976 with moderate stenosis found to be insignificant by FFR.  Patient denies chest pain, pressure or tightness. He does report dyspnea with less exertion that is concerning to him. Given progression and persistence of dyspnea concern for anginal equivalent. Patient in agreement with coronary CTA.  - Continue aspirin, rosuvastatin , carvedilol . - Schedule coronary CTA.    Lower extremity edema Echo August 2025 demonstrated normal LV/RV function, Grade I DD, mild MR/AI, mild dilatation of aortic root and ascending aorta 39 mm.  Patient reports resolution of lower extremity edema.  - Manage with compression as much as possible and elevation of legs.    Hypertension BP today 138/78. Home BP typically in the 140-150s systolic. Patient felt BP was better controlled on amlodipine . His lower extremity resolved. He is willing to retry amlodipine  and monitor for lower extremity edema.  - Add amlodipine  2.5 mg daily.  - Continue lisinopril , carvedilol .   Branch retinal artery occlusion Loop recorder implantation May  2024.  Remote device check August 2025 showed normal device function with no arrhythmias.  Patient has dizziness from vertigo. No presyncope or syncope reported.  - Continue aspirin, rosuvastatin .   Hyperlipidemia LDL 57 August 2025, at goal. - Continue rosuvastatin .  Disposition: Schedule coronary CTA. Add amlodipine  2.5 mg daily. Return in 4-6 weeks or sooner as needed.          Signed, Barnie HERO. Elen Acero, DNP, NP-C

## 2024-05-20 NOTE — Progress Notes (Signed)
 Remote Loop Recorder Transmission

## 2024-05-22 ENCOUNTER — Encounter: Payer: Self-pay | Admitting: Student

## 2024-05-22 ENCOUNTER — Ambulatory Visit: Attending: Student | Admitting: Student

## 2024-05-22 VITALS — BP 138/78 | HR 72 | Resp 20 | Ht 69.0 in | Wt 233.0 lb

## 2024-05-22 DIAGNOSIS — R600 Localized edema: Secondary | ICD-10-CM

## 2024-05-22 DIAGNOSIS — I10 Essential (primary) hypertension: Secondary | ICD-10-CM | POA: Diagnosis not present

## 2024-05-22 DIAGNOSIS — H34232 Retinal artery branch occlusion, left eye: Secondary | ICD-10-CM | POA: Diagnosis not present

## 2024-05-22 DIAGNOSIS — E785 Hyperlipidemia, unspecified: Secondary | ICD-10-CM | POA: Diagnosis not present

## 2024-05-22 DIAGNOSIS — I25118 Atherosclerotic heart disease of native coronary artery with other forms of angina pectoris: Secondary | ICD-10-CM | POA: Diagnosis not present

## 2024-05-22 DIAGNOSIS — R0609 Other forms of dyspnea: Secondary | ICD-10-CM | POA: Diagnosis not present

## 2024-05-22 DIAGNOSIS — I209 Angina pectoris, unspecified: Secondary | ICD-10-CM

## 2024-05-22 MED ORDER — AMLODIPINE BESYLATE 2.5 MG PO TABS: 2.5000 mg | ORAL_TABLET | Freq: Every day | ORAL | Status: AC

## 2024-05-22 MED ORDER — METOPROLOL TARTRATE 100 MG PO TABS: ORAL_TABLET | ORAL | 0 refills | Status: DC

## 2024-05-22 NOTE — Patient Instructions (Signed)
 Medication Instructions:   Your physician recommends the following medication changes.  START TAKING: Amlodipine  2.5 mg once daily Metoprolol  Tartrate 100 mg 1 tablet once 2 hours prior to your CTA procedure  *If you need a refill on your cardiac medications before your next appointment, please call your pharmacy*  Lab Work:  None ordered at this time  CMet Resulted on 05/17/24  If you have labs (blood work) drawn today and your tests are completely normal, you will receive your results only by:  MyChart Message (if you have MyChart) OR  A paper copy in the mail If you have any lab test that is abnormal or we need to change your treatment, we will call you to review the results.  Testing/Procedures:  Your cardiac CT will be scheduled at :   [facility] [address] [place], [place] [address] [phone]  When scheduled at  [facility], please arrive 15 mins early for check-in and test prep.  Please follow these instructions carefully (unless otherwise directed):  An IV will be required for this test and Nitroglycerin  will be given.  Hold all erectile dysfunction medications at least 3 days (72 hrs) prior to test. (Ie viagra, cialis, sildenafil, tadalafil, etc)   On the Night Before the Test: Be sure to Drink plenty of water. Do not consume any caffeinated/decaffeinated beverages or chocolate 12 hours prior to your test. Do not take any antihistamines 12 hours prior to your test.   On the Day of the Test: Drink plenty of water until 1 hour prior to the test. Do not eat any food 1 hour prior to test. You may take your regular medications prior to the test.  Take metoprolol  (Lopressor ) two hours prior to test. If you take Furosemide/Hydrochlorothiazide /Spironolactone/Chlorthalidone, please HOLD on the morning of the test. Patients who wear a continuous glucose monitor MUST remove the device prior to scanning. FEMALES-  please wear underwire-free bra if available, avoid dresses & tight clothing       After the Test: Drink plenty of water. After receiving IV contrast, you may experience a mild flushed feeling. This is normal. On occasion, you may experience a mild rash up to 24 hours after the test. This is not dangerous. If this occurs, you can take Benadryl 25 mg, Zyrtec, Claritin, or Allegra and increase your fluid intake. (Patients taking Tikosyn should avoid Benadryl, and may take Zyrtec, Claritin, or Allegra) If you experience trouble breathing, this can be serious. If it is severe call 911 IMMEDIATELY. If it is mild, please call our office.  We will call to schedule your test 2-4 weeks out understanding that some insurance companies will need an authorization prior to the service being performed.   For more information and frequently asked questions, please visit our website : [email or web address]  For non-scheduling related questions, please contact the cardiac imaging nurse navigator should you have any questions/concerns: Cardiac Imaging Nurse Navigators Direct Office Dial: [phone]   For scheduling needs, including cancellations and rescheduling, please call [place], [phone].    Referrals:  None ordered at this time   Follow-Up:  At [facility], you and your health needs are our priority.  As part of our continuing mission to provide you with exceptional heart care, our providers are all part of one team.  This team includes your primary Cardiologist (physician) and Advanced Practice Providers or APPs (Physician Assistants and Nurse Practitioners) who all work together to provide you with the care you  need, when you need it.  Your next appointment:   4 - 6 week(s)  Provider:    You may see Deatrice Cage, MD or one of the following Advanced Practice Providers on your designated Care Team:   Lonni Meager, NP Lesley Maffucci, PA-C Bernardino Bring, PA-C [place]ence  [place], PA-C Tylene Lunch, NP Barnie Hila, NP    We recommend signing up for the patient portal called MyChart.  Sign up information is provided on this After Visit Summary.  MyChart is used to connect with patients for Virtual Visits (Telemedicine).  Patients are able to view lab/test results, encounter notes, upcoming appointments, etc.  Non-urgent messages can be sent to your provider as well.   To learn more about what you can do with MyChart, go to [email or web address].

## 2024-05-25 DIAGNOSIS — G4733 Obstructive sleep apnea (adult) (pediatric): Secondary | ICD-10-CM

## 2024-05-25 NOTE — Telephone Encounter (Signed)
 Yes please need download

## 2024-05-25 NOTE — Telephone Encounter (Signed)
**Note De-identified  [place] Obfuscation** Please advise 

## 2024-05-26 ENCOUNTER — Other Ambulatory Visit: Payer: Self-pay | Admitting: Interventional Cardiology

## 2024-05-26 DIAGNOSIS — Z79899 Other long term (current) drug therapy: Secondary | ICD-10-CM

## 2024-05-26 DIAGNOSIS — I10 Essential (primary) hypertension: Secondary | ICD-10-CM

## 2024-05-29 ENCOUNTER — Ambulatory Visit: Admitting: Neurology

## 2024-05-31 NOTE — Telephone Encounter (Signed)
 Download printed and placed on Tammy's desk.  Tammy, please advise.

## 2024-06-01 ENCOUNTER — Encounter

## 2024-06-02 ENCOUNTER — Ambulatory Visit (INDEPENDENT_AMBULATORY_CARE_PROVIDER_SITE_OTHER)

## 2024-06-02 DIAGNOSIS — I4891 Unspecified atrial fibrillation: Secondary | ICD-10-CM | POA: Diagnosis not present

## 2024-06-02 LAB — CUP PACEART REMOTE DEVICE CHECK
Date Time Interrogation Session: 20251009232729
Implantable Pulse Generator Implant Date: 20240522

## 2024-06-04 ENCOUNTER — Ambulatory Visit: Payer: Self-pay | Admitting: Cardiology

## 2024-06-05 NOTE — Progress Notes (Signed)
 Remote Loop Recorder Transmission

## 2024-06-08 DIAGNOSIS — G4733 Obstructive sleep apnea (adult) (pediatric): Secondary | ICD-10-CM

## 2024-06-09 ENCOUNTER — Telehealth: Payer: Self-pay

## 2024-06-09 NOTE — Telephone Encounter (Signed)
 Copied from CRM [id number]. Topic: Clinical - Order For Equipment >> Jun 07, 2024  3:14 PM Jack Norris wrote: Reason for CRM:   Pt is needing new order for CPAP supplies sent to [facility] due to [facility] being out of network with his insurance. He provided the number for local location that he would like the order sent to  CB# for [facility]  [phone]  Pt requested call back to notify order has been sent.  CB#  [phone], can also text once order is sent

## 2024-06-09 NOTE — Telephone Encounter (Signed)
 I have sent the previous order to [facility] and will advise if anything further is needed.

## 2024-06-13 ENCOUNTER — Encounter (HOSPITAL_COMMUNITY): Payer: Self-pay

## 2024-06-13 DIAGNOSIS — H16143 Punctate keratitis, bilateral: Secondary | ICD-10-CM | POA: Diagnosis not present

## 2024-06-13 DIAGNOSIS — H04123 Dry eye syndrome of bilateral lacrimal glands: Secondary | ICD-10-CM | POA: Diagnosis not present

## 2024-06-13 DIAGNOSIS — H5021 Vertical strabismus, right eye: Secondary | ICD-10-CM | POA: Diagnosis not present

## 2024-06-13 DIAGNOSIS — H40051 Ocular hypertension, right eye: Secondary | ICD-10-CM | POA: Diagnosis not present

## 2024-06-13 DIAGNOSIS — H35371 Puckering of macula, right eye: Secondary | ICD-10-CM | POA: Diagnosis not present

## 2024-06-13 DIAGNOSIS — H532 Diplopia: Secondary | ICD-10-CM | POA: Diagnosis not present

## 2024-06-15 ENCOUNTER — Ambulatory Visit
Admission: RE | Admit: 2024-06-15 | Discharge: 2024-06-15 | Disposition: A | Discharge status: Home / Self Care | Source: Ambulatory Visit | Attending: Cardiology | Admitting: Cardiology

## 2024-06-15 ENCOUNTER — Other Ambulatory Visit: Payer: Self-pay | Admitting: Cardiology

## 2024-06-15 ENCOUNTER — Ambulatory Visit
Admission: RE | Admit: 2024-06-15 | Discharge: 2024-06-15 | Disposition: A | Discharge status: Home / Self Care | Source: Ambulatory Visit | Attending: Student | Admitting: Student

## 2024-06-15 DIAGNOSIS — R931 Abnormal findings on diagnostic imaging of heart and coronary circulation: Secondary | ICD-10-CM

## 2024-06-15 DIAGNOSIS — I209 Angina pectoris, unspecified: Secondary | ICD-10-CM | POA: Insufficient documentation

## 2024-06-15 MED ORDER — METOPROLOL TARTRATE 5 MG/5ML IV SOLN: 10.0000 mg | Freq: Once | INTRAVENOUS | Status: DC | PRN

## 2024-06-15 MED ORDER — IOHEXOL 350 MG/ML SOLN
100.0000 mL | Freq: Once | INTRAVENOUS | Status: AC | PRN
  Administered 2024-06-15: 100 mL via INTRAVENOUS

## 2024-06-15 MED ORDER — DILTIAZEM HCL 25 MG/5ML IV SOLN: 10.0000 mg | INTRAVENOUS | Status: DC | PRN

## 2024-06-15 MED ORDER — NITROGLYCERIN 0.4 MG SL SUBL
0.8000 mg | SUBLINGUAL_TABLET | Freq: Once | SUBLINGUAL | Status: AC
Start: 2024-06-15 — End: 2024-06-15
  Administered 2024-06-15: 0.8 mg via SUBLINGUAL
  Filled 2024-06-15: qty 25

## 2024-06-17 NOTE — Progress Notes (Unsigned)
 Cardiology Clinic Note   Date: 06/21/2024 ID: DRAXTON LUU, DOB 11-27-1940, MRN [id number]  Primary Cardiologist:  Deatrice Cage, MD  Chief Complaint   Jack Norris is a 83 y.o. male who presents to the clinic today for follow up after testing.   Patient Profile   Jack Norris is followed by Dr. Cage for the history outlined below.      Past medical history significant for: CAD. Coronary CTA with FFR 06/15/2024: Coronary calcium  score of 1755 (77th percentile).  Moderate proximal LAD and LCx stenosis, mild proximal LCx.  FFR did not show significant stenosis. Lower extremity edema. Echo 04/18/2024: EF 55 to 60%.  No RWMA.  Grade 1 DD.  Normal RV size/function.  Mild MR/AI.  Mild dilatation of aortic root and ascending aorta 39 mm. PAD. Hypertension. Hyperlipidemia. Lipid panel 03/31/2024: LDL 57, HDL 33, TG 93, total 108. OSA. GERD. Retinal artery occlusion of the left eye. CKD stage III. Branch retinal artery occlusion. Loop recorder implantation 01/13/2023. Remote device check 06/01/2024: Battery status okay.  Normal device function.  No arrhythmias seen, normal histograms.  In summary, patient was previously followed by Dr. Dann who he establish care with in October 2018.  He reported a family history of CAD in father who underwent CABG x 4 (he had been a chronic smoker).  He underwent exercise stress test for risk stratification which demonstrated normal exercise tolerance, hypertensive response to exercise, frequent PVCs during stress and occasional PVCs in recovery, no ischemia.  He had a coronary CTA with FFR in December 2019 which showed a calcium  score of 976 and moderate CAD found to be insignificant by FFR.  In early 2024 patient was found to have branch retinal artery occlusion on routine eye exam with no visual symptoms.  Dr. Dann recommended echo and carotid ultrasound.  Echo showed EF 50 to 55%, no RWMA, Grade I DD, normal RV size/function,  trivial MR.  Carotid artery ultrasound was normal.  Outpatient cardiac monitoring was ordered to evaluate for possible A-fib.  30-day cardiac monitor demonstrated NSR with rare PACs/PVCs and no A-fib.  He was evaluated by Dr. Inocencio in May 2024 and loop recorder was implanted.  Patient establish care with Dr. Cloria on 02/01/2024.  He had no cardiac complaints at that time.  His biggest concern was vertigo for which he had extensive workup by neurology and ENT.  His LDL was noted to be above target and rosuvastatin  was increased.  Patient was seen in the office on 03/27/2024 for lower extremity edema.  He reported being on amlodipine  for a couple of months and noted to have worsening lower extremity edema as well as an episode of vertigo.  Amlodipine  was reduced from 10 mg to 2.5 mg and edema improved.  He denied chest pain, shortness of breath, palpitations.  BP was elevated at 140/74.  Amlodipine  was stopped and he was started on carvedilol  3.125 mg twice daily.  He was instructed to follow a low-sodium diet and manage extremity edema with elevation and compression socks.  Echo was updated and demonstrated normal LV/RV function as detailed above.   Patient was seen on 04/21/2024 for follow-up after medication changes.  Patient reported improvement in lower extremity edema.  He denied shortness of breath however his wife reported she notes he gets winded with walking.  Patient felt that his blood pressure was better controlled on amlodipine  with SBP averaging in the 120s.  Since stopping amlodipine  and starting carvedilol  SBP averaging in  the 140s.  Carvedilol  was increased to 6.25 twice daily.  Patient presented to the ED on 05/17/2024 while traveling in [place] .  He reported elevated BP, headache x 3 weeks, and shortness of breath for several days.  Patient reported SBP 140-170s at home.  He reported progressive mild dyspnea with exertion over 6 weeks.  No lower extremity edema.  He endorsed increased sodium  intake during trip.  BP 179/94.  He was euvolemic on exam.  Workup was unremarkable.  Patient was discharged.   Patient was last seen in the office by me on 05/22/2024 for hospital follow-up.  He reported home BP in the 140s to 150s systolic.  He reported becoming more dyspneic with less activity.  There was concern dyspnea being anginal equivalent.  Coronary CTA was ordered.  Patient was started on amlodipine  2.5 mg daily.  Coronary CTA showed moderate proximal LAD and LCx stenosis that was felt to be insignificant with FFR.     History of Present Illness    Today, patient is accompanied by his wife. He reports continued shortness of breath depending what you call shortness of breath. He states if he walks a short distance (like from the waiting room to the exam room) he will feel winded. His wife states even when he is at rest she will notice him breathing heavy. He feels like sometimes his lungs are not expanding all the way and he has to take a deep breath. He has a history of smoking very briefly as a young adult. He does admit to being mostly sedentary. He denies lower extremity edema. He is compliant with CPAP. He sleeps about 6 hours through the night getting up once to use the bathroom. However, he reports continued daytime somnolence and typically takes a 2+ hour nap in the afternoon. He states if he is sitting in his recliner at any time in the late morning or the evening when watching TV he will fall asleep. He does not use his CPAP for his naps. BP is well controlled at home and typically in the 130s. He reports a daily mild headache ever since starting carvedilol . It will resolve with tylenol  but he does not take the tylenol  everyday and just deals with the headache. He has been dealing with vertigo since the beginning of the year. He has been worked up by ENT and neurology. He was given a prescription for as needed ativan which he feels helps the dizziness.     ROS: All other systems  reviewed and are otherwise negative except as noted in History of Present Illness.  EKGs/Labs Reviewed       EKG is not performed today.   11/23/2023: BUN 16; Creatinine, Ser 1.28; Potassium 4.6; Sodium 140 03/31/2024: ALT 16; AST 17   09/17/2023: Hemoglobin 14.5; WBC 12.7   09/17/2023: TSH 0.83    Physical Exam    VS:  BP 138/82 (BP Location: Left Arm, Patient Position: Sitting, Cuff Size: Normal)   Pulse 68   Ht 5' 9 (1.753 m)   Wt 232 lb (105.2 kg)   SpO2 99%   BMI 34.26 kg/m  , BMI Body mass index is 34.26 kg/m.  GEN: Well nourished, well developed, in no acute distress. Neck: No JVD or carotid bruits. Cardiac:  RRR.  No murmur. No rubs or gallops.   Respiratory:  Respirations regular and unlabored. Clear to auscultation without rales, wheezing or rhonchi. GI: Soft, nontender, nondistended. Extremities: Radials/DP/PT 2+ and equal bilaterally. No clubbing or cyanosis.  No edema. Compression socks in place.   Skin: Warm and dry, no rash. Neuro: Strength intact.  Assessment & Plan   CAD/DOE/Angina Coronary CTA with FFR October 2025 showed coronary calcium  score of 1755 with moderate proximal LAD and LCx stenosis found to be insignificant by FFR.  Patient reports continued dyspnea unchanged from previous. He admits to being mostly sedentary.  - Encouraged starting 10 minute daily walks and increasing as tolerated.  - Continue aspirin, rosuvastatin . - Follow up with pulmonology.    Lower extremity edema Echo August 2025 demonstrated normal LV/RV function, Grade I DD, mild MR/AI, mild dilatation of aortic root and ascending aorta 39 mm.  Patient reports resolution of lower extremity edema. - Manage with compression as much as possible and elevation of legs.    Hypertension BP today 138/82. Home BP typically in the 130s systolic. Patient reports he has developed daily mild headaches since starting carvedilol . It will resolve with tylenol  but he does not want to take tylenol   daily.  - Stop carvedilol .  - Start hydralazine 25 mg bid.  - Send in 1 week BP log/  - Continue lisinopril , amlodipine .   Branch retinal artery occlusion Loop recorder implantation May 2024.  Remote device check October 2025 showed normal device function with no arrhythmias.  Patient has dizziness from vertigo. No presyncope or syncope reported. - Continue aspirin, rosuvastatin .   Hyperlipidemia LDL 57 August 2025, at goal. - Continue rosuvastatin .  OSA Patient reports compliance with CPAP. He sleeps 6 hours at night getting up once to  use the bathroom. He reports daytime somnolence. He reports if he sits in his recliner at any time of day he will fall asleep. He typically naps every afternoon for about 2 hours. His wife states he probably gets 4+ hours of a sleep between all of his naps. He does not use his CPAP when he naps.  - Encouraged napping one time a day for no more than 45 minutes.  - Encouraged beginning a walking program.  - Continue compliance with CPAP.   Disposition: Stop carvedilol . Start hydralazine 25 mg bid. Return in 3 months or sooner as needed.          Signed, Barnie HERO. Shabria Egley, DNP, NP-C

## 2024-06-19 ENCOUNTER — Other Ambulatory Visit: Payer: Self-pay | Admitting: Cardiovascular Disease

## 2024-06-21 ENCOUNTER — Ambulatory Visit: Attending: Student | Admitting: Student

## 2024-06-21 ENCOUNTER — Encounter: Payer: Self-pay | Admitting: Student

## 2024-06-21 VITALS — BP 138/82 | HR 68 | Ht 69.0 in | Wt 232.0 lb

## 2024-06-21 DIAGNOSIS — I10 Essential (primary) hypertension: Secondary | ICD-10-CM | POA: Diagnosis not present

## 2024-06-21 DIAGNOSIS — R0609 Other forms of dyspnea: Secondary | ICD-10-CM

## 2024-06-21 DIAGNOSIS — E785 Hyperlipidemia, unspecified: Secondary | ICD-10-CM

## 2024-06-21 DIAGNOSIS — G4733 Obstructive sleep apnea (adult) (pediatric): Secondary | ICD-10-CM | POA: Diagnosis not present

## 2024-06-21 DIAGNOSIS — I25118 Atherosclerotic heart disease of native coronary artery with other forms of angina pectoris: Secondary | ICD-10-CM | POA: Diagnosis not present

## 2024-06-21 DIAGNOSIS — R600 Localized edema: Secondary | ICD-10-CM

## 2024-06-21 DIAGNOSIS — H34232 Retinal artery branch occlusion, left eye: Secondary | ICD-10-CM

## 2024-06-21 MED ORDER — HYDRALAZINE HCL 25 MG PO TABS: 25.0000 mg | ORAL_TABLET | Freq: Two times a day (BID) | ORAL | 3 refills | Status: AC

## 2024-06-21 NOTE — Patient Instructions (Signed)
 Medication Instructions:   Your physician recommends the following medication changes.  STOP TAKING: Carvedilol   START TAKING: Hydralazine 25 mg 2 times daily  *If you need a refill on your cardiac medications before your next appointment, please call your pharmacy*  Lab Work:  None ordered at this time   If you have labs (blood work) drawn today and your tests are completely normal, you will receive your results only by:  MyChart Message (if you have MyChart) OR  A paper copy in the mail If you have any lab test that is abnormal or we need to change your treatment, we will call you to review the results.  Testing/Procedures:  None ordered at this time   Referrals:  None ordered at this time   Follow-Up:  At [facility], you and your health needs are our priority.  As part of our continuing mission to provide you with exceptional heart care, our providers are all part of one team.  This team includes your primary Cardiologist (physician) and Advanced Practice Providers or APPs (Physician Assistants and Nurse Practitioners) who all work together to provide you with the care you need, when you need it.  Your next appointment:   3 month(s)  Provider:    Deatrice Cage, MD or Barnie Hila, NP    We recommend signing up for the patient portal called MyChart.  Sign up information is provided on this After Visit Summary.  MyChart is used to connect with patients for Virtual Visits (Telemedicine).  Patients are able to view lab/test results, encounter notes, upcoming appointments, etc.  Non-urgent messages can be sent to your provider as well.   To learn more about what you can do with MyChart, go to [email or web address].   Other Instructions  Send in your Blood Pressure readings in about one week.  You can send them through MyChart, or you can make a copy and drop it off at our office.  Here are some instructions to go with your blood pressure log that is  being provided, and please make extra copies of the blood pressure log to make sure you have plenty on hand.  Avoid alcohol, caffeine, and exercise within 30 minutes before checking blood pressure. Sit still, with feet flat on the floor, in a straight backed, supportive chair.  Rest for 5 minutes.  Support arm on flat surface at heart level. Bottom of cuff should be placed directly above the bend of the elbow. Take multiple readings.  Write down and bring to all follow up appointments.  Bring cuff to clinic periodically for accuracy comparison.

## 2024-06-21 NOTE — Telephone Encounter (Signed)
 Saw cardiologist and reviewed CTA from last week and ECHO from late August.  Patient said PA at cardiology told him his heart was good and to f/u with pulmonologist.  Patient has an appointment on 07/04/2024 here with Dr. Adrien.  You are booked until December.  Tammy, patient just wanted to give you these updates.

## 2024-06-22 NOTE — Telephone Encounter (Signed)
 Yes please set up ov with Dr. Jude  or Im glad to see him

## 2024-06-27 DIAGNOSIS — I5032 Chronic diastolic (congestive) heart failure: Secondary | ICD-10-CM | POA: Diagnosis not present

## 2024-06-27 DIAGNOSIS — R600 Localized edema: Secondary | ICD-10-CM | POA: Diagnosis not present

## 2024-07-02 ENCOUNTER — Encounter

## 2024-07-03 ENCOUNTER — Encounter

## 2024-07-03 ENCOUNTER — Ambulatory Visit

## 2024-07-03 DIAGNOSIS — I493 Ventricular premature depolarization: Secondary | ICD-10-CM

## 2024-07-03 LAB — CUP PACEART REMOTE DEVICE CHECK
Date Time Interrogation Session: 20251109233154
Implantable Pulse Generator Implant Date: 20240522

## 2024-07-04 ENCOUNTER — Ambulatory Visit

## 2024-07-04 ENCOUNTER — Ambulatory Visit: Payer: Self-pay | Admitting: Cardiology

## 2024-07-04 VITALS — BP 124/77 | HR 79 | Temp 97.7°F | Ht 69.0 in | Wt 225.6 lb

## 2024-07-04 DIAGNOSIS — R0600 Dyspnea, unspecified: Secondary | ICD-10-CM

## 2024-07-04 DIAGNOSIS — G4733 Obstructive sleep apnea (adult) (pediatric): Secondary | ICD-10-CM | POA: Diagnosis not present

## 2024-07-04 NOTE — Progress Notes (Signed)
 @Patient  ID: Jack Norris, male    DOB: August 29, 1940, 83 y.o.   MRN: [id number]  Chief Complaint  Patient presents with   Sleep Apnea    Pt stated he may be having a breathing flare up CPAP used lastnight DME: resmed Pt sleeps during the day in evening in recliner    Referring provider: Avelina Greig BRAVO, MD  Discussed the use of AI scribe software for clinical note transcription with the patient, who gave verbal consent to proceed.  History of Present Illness Jack Norris is an 83 year old male with CPAP use and hypertension who presents with breathing difficulties and medication side effects. He is accompanied by his wife.   He experiences breathing difficulties at night, characterized by shallow breathing and effortful inhalation. These symptoms worsened after switching from amlodipine  to carvedilol , leading to increased shortness of breath and mild headaches. Improvement occurred after reverting to a lower dose of amlodipine  and adding hydralazine.  He uses CPAP set at 7 cm H2O and reports airflow issues with the nasal pillows, which his wife can hear. Despite this, he remains diligent about nightly use. He experiences increased daytime sleepiness, often dozing off after meals and in the evenings, which has progressively worsened. He attributes some of this to early rising habits and recent weight loss efforts. His CPAP machine was recently adjusted to 11.   He has a history of small pulmonary nodules noted on a CT scan in 2023, with some nodules reportedly decreasing in size. He quit smoking at age 51 and has a history of childhood asthma, described as exercise-induced and occasionally triggered by colds or allergens.  No current wheezing or significant chest congestion. Mild headaches and increased daytime sleepiness are present. No current shortness of breath at rest.  Overall he feels at baseline and he does not feel to have shortness of breath. However his wife  noticed faster breathing.  ROS as above.  Allergies  Allergen Reactions   Paroxetine     REACTION: Had mild personality change on this medication and doesn't want to take this again.   Morphine  Other (See Comments)    Causes bladder problems   Ciprofloxacin  Other (See Comments)    ABDOMINAL PAIN/ sob   Flagyl  [Metronidazole  Hcl] Other (See Comments)    ABDOMINAL PAIN/ sob    Immunization History  Administered Date(s) Administered   Fluad Quad(high Dose 65+) 06/16/2019, 08/02/2020, 08/08/2021, 05/05/2022   Fluad Trivalent(High Dose 65+) 06/17/2023   INFLUENZA, HIGH DOSE SEASONAL PF 06/14/2018   Influenza Split 06/26/2011, 07/11/2012   Influenza Whole 05/24/2008, 06/24/2008, 07/04/2009, 05/24/2010   Influenza,inj,Quad PF,6+ Mos 07/26/2013, 06/12/2014, 04/15/2015, 04/16/2016, 07/01/2017   Influenza-Unspecified 08/08/2021   Moderna Covid-19 Fall Seasonal Vaccine 56yrs & older 06/17/2023   PFIZER(Purple Top)SARS-COV-2 Vaccination 10/04/2019, 10/25/2019, 06/07/2020, 03/10/2021   Pneumococcal Conjugate-13 04/15/2015   Pneumococcal Polysaccharide-23 09/14/2007   Td 08/02/2007   Tdap 12/15/2021   Zoster Recombinant(Shingrix) 01/13/2022, 04/01/2022   Zoster, Live 04/08/2009    Past Medical History:  Diagnosis Date   Allergy [id number]   Arrhythmia    Asthma, mild    as child-only now if has URI   BPH (benign prostatic hypertrophy)    BPPV (benign paroxysmal positional vertigo)    Cataract [id number]   Coronary artery disease    Diverticulosis    Eczema    Fatty liver disease, nonalcoholic    Frequent nosebleeds    History of   GERD (gastroesophageal reflux disease)    occ uses  TUMS as needed   Glaucoma    History of bronchitis    History of colon polyps    History of diverticulitis    History of kidney stones    Hyperlipemia    Hypertension    Influenza A 11/2017   Memory changes    OA (osteoarthritis)    Obesity    Overactive bladder 2020   PVCs (premature  ventricular contractions)    Renal calculi right    AND LEFT X1 NON-OBSTRUCTIVE   Right sided sciatica    Bilateral buttock and thighs   Simple renal cyst right interpoler, simpler in nature , asymptomatic  per urologist note (dr dahlstedt)  12-26-2011   BENIGN, has been drained   Sleep apnea [id number]   Using CPAP   Testosterone  deficiency    Wears glasses    Wears hearing aid     Tobacco History: Social History   Tobacco Use  Smoking Status Former   Current packs/day: 0.00   Types: Cigarettes   Start date: 01/05/1959   Quit date: 01/05/1967   Years since quitting: 57.5  Smokeless Tobacco Never  Tobacco Comments   QUIT AGE 87   Counseling given: Not Answered Tobacco comments: QUIT AGE 87   Outpatient Medications Prior to Visit  Medication Sig Dispense Refill   albuterol  (VENTOLIN  HFA) 108 (90 Base) MCG/ACT inhaler Inhale 2 puffs into the lungs every 6 (six) hours as needed for wheezing or shortness of breath. 8 g 2   amLODipine  (NORVASC ) 2.5 MG tablet Take 1 tablet (2.5 mg total) by mouth daily.     aspirin EC 325 MG tablet Take 325 mg by mouth daily.     B-D 3CC LUER-LOK SYR 18GX1-1/2 18G X 1-1/2 3 ML MISC USE ONCE A WEEK AS DIRECTED (Patient not taking: Reported on 06/21/2024)  2   Cholecalciferol (VITAMIN D3) 1.25 MG (50000 UT) CAPS Take 1 capsule (1.25 mg total) by mouth once a week. (Patient not taking: Reported on 06/21/2024) 12 capsule 0   cholecalciferol (VITAMIN D3) 25 MCG (1000 UNIT) tablet Take 1,000 Units by mouth daily.     clobetasol cream (TEMOVATE) 0.05 % Apply 1 Application topically 2 (two) times daily.     diazepam  (VALIUM ) 2 MG tablet Take 1 tablet (2 mg total) by mouth every 8 (eight) hours as needed for anxiety. 30 tablet 3   fenofibrate  micronized (LOFIBRA) 67 MG capsule TAKE 1 CAPSULE(67 MG) BY MOUTH DAILY BEFORE BREAKFAST 90 capsule 2   hydrALAZINE (APRESOLINE) 25 MG tablet Take 1 tablet (25 mg total) by mouth in the morning and at bedtime. 180  tablet 3   lisinopril  (ZESTRIL ) 40 MG tablet TAKE 1 TABLET(40 MG) BY MOUTH DAILY 90 tablet 3   meclizine  (ANTIVERT ) 25 MG tablet Take 1 tablet (25 mg total) by mouth 3 (three) times daily as needed for dizziness. 30 tablet 0   mometasone (ELOCON) 0.1 % cream Apply 1 Application topically daily.     omeprazole (PRILOSEC) 20 MG capsule Take 20 mg by mouth daily. Per patient OTC     psyllium (METAMUCIL) 58.6 % powder Take 1 packet by mouth daily.     rosuvastatin  (CRESTOR ) 40 MG tablet Take 1 tablet (40 mg total) by mouth daily. 90 tablet 3   testosterone  cypionate (DEPOTESTOSTERONE CYPIONATE) 200 MG/ML injection Inject 0.5 mLs (100 mg total) into the muscle every 7 (seven) days. 10 mL 0   triamcinolone  (KENALOG ) 0.025 % cream Apply 1 application topically as needed.  vitamin B-12 (CYANOCOBALAMIN ) 100 MCG tablet Take 100 mcg by mouth daily.     No facility-administered medications prior to visit.     Physical Exam  BP 124/77   Pulse 79   Temp 97.7 F (36.5 C) (Oral)   Ht 5' 9 (1.753 m) Comment: per patient  Wt 225 lb 9.6 oz (102.3 kg)   SpO2 95% Comment: on RA  BMI 33.32 kg/m   GEN: A/Ox3; pleasant , NAD, well nourished   NECK:  Supple RESP  Clear lung sounds w/o, wheezes/ rales/ or rhonchi. no accessory muscle use, no dullness to percussion CARD:  RRR, no m/r/g, no peripheral edema, pulses intact, no cyanosis or clubbing. GI:   Soft & nt; nml bowel sounds; no organomegaly or masses detected.  MSK move 4 extrimities Neuro: alert, no focal deficits noted.   Skin: Warm, no lesions or rashes  Imaging: CUP PACEART REMOTE DEVICE CHECK Result Date: 07/03/2024 ILR summary report received. Battery status OK. Normal device function. No new symptom, brady, or pause episodes. No new AF episodes. 1 tachy lasting 20 seconds, EGM shows NCT. Monthly summary reports and ROV/PRN - CS, CVRS  CT CORONARY MORPH W/CTA COR W/SCORE W/CA W/CM &/OR WO/CM Addendum Date: 06/22/2024 ADDENDUM REPORT:  06/22/2024 20:44 EXAM: OVER-READ INTERPRETATION  CT CHEST The following report is an over-read performed by radiologist Dr. Andrea Gasman of [facility] on 06/22/2024. This over-read does not include interpretation of cardiac or coronary anatomy or pathology. The coronary CTA interpretation by the cardiologist is attached. COMPARISON:  Chest CT 01/03/2022 FINDINGS: Vascular: Aortic atherosclerosis. The included aorta is normal in caliber. Mediastinum/nodes: No adenopathy or mass. Unremarkable esophagus. Lungs: No focal airspace disease. Stable perifissural lymph nodes in both lungs. No suspicious pulmonary nodule. No pleural fluid. The included airways are patent. Upper abdomen: No acute findings.  Hepatic steatosis. Musculoskeletal: There are no acute or suspicious osseous abnormalities. IMPRESSION: 1.  Aortic Atherosclerosis (ICD10-I70.0). 2. Hepatic steatosis. 3. Benign perifissural lymph nodes in both lungs. Electronically Signed   By: Andrea Gasman M.D.   On: 06/22/2024 20:44   Result Date: 06/22/2024 CLINICAL DATA:  CAD, dyspnea on exertion EXAM: Cardiac/Coronary  CTA TECHNIQUE: The patient was scanned on a Siemens Somatom scanner. : A prospective scan was triggered in the ascending thoracic aorta. Axial non-contrast 3 mm slices were carried out through the heart. The data set was analyzed on a dedicated work station and scored using the Agatson method. Gantry rotation speed was 66 msecs and collimation was .6 mm. 100mg  of metoprolol  and 0.8 mg of sl NTG was given. The 3D data set was reconstructed in 5% intervals of the 60-95 % of the R-R cycle. Diastolic phases were analyzed on a dedicated work station using MPR, MIP and VRT modes. The patient received 100 cc of contrast. FINDINGS: Aorta:  Normal size.  No calcifications.  No dissection. Aortic Valve:  Trileaflet.  No calcifications. Coronary Arteries:  Normal coronary origin.  Right dominance. RCA is a dominant artery. There is  calcified plaque in the proximal RCA causing mild stenosis (25-49%). Left main gives rise to LAD and LCX arteries. LM has no disease. LAD has calcified plaque proximally causing moderate stenosis (50-69%). LCX is a non-dominant artery. There is calcified and non calcified plaque proximally causing moderate stenosis (50-69%). Other findings: Normal pulmonary vein drainage into the left atrium. Normal left atrial appendage without a thrombus. Normal size of the pulmonary artery. IMPRESSION: 1. Coronary calcium  score of 1755. This was 77th  percentile for age and sex matched control. 2. Normal coronary origin with right dominance. 3. Moderate stenosis in proximal LAD and LCx (50-69%). 4. Mild proximal LCx stenosis (25-49%). 5. Additional analysis with CT FFR will be submitted and reported separately. Electronically Signed: By: Redell Cave M.D. On: 06/15/2024 14:07   CT CORONARY FRACTIONAL FLOW RESERVE FLUID ANALYSIS Result Date: 06/15/2024 EXAM: CT FFR ANALYSIS CLINICAL DATA:  Abnormal CCTA FINDINGS: FFRct analysis was performed on the original cardiac CT angiogram dataset. Diagrammatic representation of the FFRct analysis is provided in a separate PDF document in PACS. This dictation was created using the PDF document and an interactive 3D model of the results. 3D model is not available in the EMR/PACS. Normal FFR range is >0.80. 1. Left Main:  No significant stenosis. 2. LAD: No significant stenosis.  FFRct 0.83 3. LCX: No significant stenosis.  FFRct 0.93 4. RCA: No significant stenosis.  FFRct 0.94 IMPRESSION: 1.  CT FFR analysis didn't show any significant stenosis. . Electronically Signed   By: Redell Cave M.D.   On: 06/15/2024 14:09     Assessment & Plan:   Assessment & Plan Dyspnea Chronic mild dyspnea improved after carvedilol  discontinuation. No significant cardiac or pulmonary abnormalities. Differential includes cardiac, pulmonary, and weight-related causes. CT scan in coronary  arteries is negative for nodules or masses. Patient does not seem bother by dyspnea. - Ordered pulmonary function test to rule out asthma or other pulmonary issues. - Advised continuation of weight loss and exercise regimen. He has lost 10 pounds already. - Instructed to monitor for new symptoms such as chest pain or increased shortness of breath, and seek immediate medical attention if these occur.  Obstructive sleep apnea Managed with CPAP therapy. CPAP supply issues resolved. Settings adjusted to 11 cm H2O. Symptom changes noted with recent medication adjustments. - Continue CPAP therapy with current settings. - Follow up with Tammy regarding CPAP supplies and settings.  Overweight Recent weight loss of 10 pounds in 3 weeks through [employer]. Current weight 220-225 pounds, down from 235 pounds. Continued weight loss and exercise recommended to improve health and reduce dyspnea. - Continue [employer] program for weight loss. - Encouraged regular exercise, starting with 10-15 minutes daily and gradually increasing duration.   Norris Marny LITTIE Jack Winfred, MD 07/04/2024

## 2024-07-04 NOTE — Patient Instructions (Addendum)
 Dear Jack Norris  I will recommend to perform a pulmonary function test for better evaluation of your lungs, given your history of asthma.   I recommend to continue to lose weight and exercise daily.  If you experience worsening of shortness of breath, chest pain, nausea and vomiting, I will recommend to go to the ER for evaluation.

## 2024-07-06 NOTE — Progress Notes (Signed)
 Remote Loop Recorder Transmission

## 2024-07-27 DIAGNOSIS — H35371 Puckering of macula, right eye: Secondary | ICD-10-CM | POA: Diagnosis not present

## 2024-07-27 DIAGNOSIS — H34232 Retinal artery branch occlusion, left eye: Secondary | ICD-10-CM | POA: Diagnosis not present

## 2024-07-27 DIAGNOSIS — H43811 Vitreous degeneration, right eye: Secondary | ICD-10-CM | POA: Diagnosis not present

## 2024-07-27 DIAGNOSIS — H35352 Cystoid macular degeneration, left eye: Secondary | ICD-10-CM | POA: Diagnosis not present

## 2024-07-27 DIAGNOSIS — H34832 Tributary (branch) retinal vein occlusion, left eye, with macular edema: Secondary | ICD-10-CM | POA: Diagnosis not present

## 2024-07-27 DIAGNOSIS — H35372 Puckering of macula, left eye: Secondary | ICD-10-CM | POA: Diagnosis not present

## 2024-07-27 DIAGNOSIS — H353122 Nonexudative age-related macular degeneration, left eye, intermediate dry stage: Secondary | ICD-10-CM | POA: Diagnosis not present

## 2024-08-02 ENCOUNTER — Encounter: Payer: Self-pay | Admitting: Family Medicine

## 2024-08-02 ENCOUNTER — Encounter

## 2024-08-02 NOTE — Telephone Encounter (Signed)
 Please call patient to set up lab only appointment.

## 2024-08-03 ENCOUNTER — Ambulatory Visit

## 2024-08-03 DIAGNOSIS — I4891 Unspecified atrial fibrillation: Secondary | ICD-10-CM | POA: Diagnosis not present

## 2024-08-03 LAB — CUP PACEART REMOTE DEVICE CHECK
Date Time Interrogation Session: 20251210233738
Implantable Pulse Generator Implant Date: 20240522

## 2024-08-03 NOTE — Telephone Encounter (Signed)
 Please advise

## 2024-08-03 NOTE — Telephone Encounter (Signed)
 Previously on CPAP 7 cmH2o. Order sent to DME for change

## 2024-08-04 ENCOUNTER — Encounter: Payer: Self-pay | Admitting: Family Medicine

## 2024-08-07 ENCOUNTER — Ambulatory Visit: Payer: Self-pay | Admitting: Cardiology

## 2024-08-07 NOTE — Telephone Encounter (Signed)
 Called and schedule pt for fasting labs

## 2024-08-08 ENCOUNTER — Ambulatory Visit: Admitting: *Deleted

## 2024-08-08 DIAGNOSIS — R0600 Dyspnea, unspecified: Secondary | ICD-10-CM

## 2024-08-08 LAB — PULMONARY FUNCTION TEST
DL/VA % pred: 127 %
DL/VA: 4.93 ml/min/mmHg/L
DLCO cor % pred: 120 %
DLCO cor: 27.6 ml/min/mmHg
DLCO unc % pred: 120 %
DLCO unc: 27.6 ml/min/mmHg
FEF 25-75 Post: 1.84 L/s
FEF 25-75 Pre: 1.69 L/s
FEF2575-%Change-Post: 9 %
FEF2575-%Pred-Post: 109 %
FEF2575-%Pred-Pre: 100 %
FEV1-%Change-Post: 2 %
FEV1-%Pred-Post: 94 %
FEV1-%Pred-Pre: 92 %
FEV1-Post: 2.41 L
FEV1-Pre: 2.35 L
FEV1FVC-%Change-Post: -2 %
FEV1FVC-%Pred-Pre: 103 %
FEV6-%Change-Post: 5 %
FEV6-%Pred-Post: 98 %
FEV6-%Pred-Pre: 93 %
FEV6-Post: 3.34 L
FEV6-Pre: 3.16 L
FEV6FVC-%Change-Post: 0 %
FEV6FVC-%Pred-Post: 107 %
FEV6FVC-%Pred-Pre: 106 %
FVC-%Change-Post: 4 %
FVC-%Pred-Post: 91 %
FVC-%Pred-Pre: 87 %
FVC-Post: 3.36 L
FVC-Pre: 3.2 L
Post FEV1/FVC ratio: 72 %
Post FEV6/FVC ratio: 99 %
Pre FEV1/FVC ratio: 73 %
Pre FEV6/FVC Ratio: 99 %
RV % pred: 74 %
RV: 1.97 L
TLC % pred: 90 %
TLC: 6.15 L

## 2024-08-08 NOTE — Patient Instructions (Signed)
 Full PFT performed today.

## 2024-08-08 NOTE — Progress Notes (Cosign Needed)
 Full PFT performed today.

## 2024-08-10 NOTE — Progress Notes (Signed)
 Remote Loop Recorder Transmission

## 2024-08-11 ENCOUNTER — Ambulatory Visit: Payer: Self-pay

## 2024-08-11 NOTE — Progress Notes (Signed)
 Called and spoke to pt - advised of PFT results per Dr. Adrien. Pt verbalized understanding, NFN.

## 2024-08-23 ENCOUNTER — Other Ambulatory Visit: Payer: Self-pay | Admitting: Interventional Cardiology

## 2024-08-31 ENCOUNTER — Telehealth: Payer: Self-pay | Admitting: *Deleted

## 2024-08-31 DIAGNOSIS — E7800 Pure hypercholesterolemia, unspecified: Secondary | ICD-10-CM

## 2024-08-31 DIAGNOSIS — R5383 Other fatigue: Secondary | ICD-10-CM

## 2024-08-31 NOTE — Telephone Encounter (Signed)
-----   Message from Veva JINNY Ferrari sent at 08/31/2024  3:18 PM EST ----- Regarding: Lab orders for Tue, 1.20.26 Patient is scheduled for CPX labs, please order future labs, Thanks , Veva

## 2024-09-02 ENCOUNTER — Encounter

## 2024-09-03 ENCOUNTER — Ambulatory Visit

## 2024-09-03 DIAGNOSIS — I4891 Unspecified atrial fibrillation: Secondary | ICD-10-CM

## 2024-09-04 LAB — CUP PACEART REMOTE DEVICE CHECK
Date Time Interrogation Session: 20260110233417
Implantable Pulse Generator Implant Date: 20240522

## 2024-09-05 ENCOUNTER — Ambulatory Visit: Payer: Self-pay | Admitting: Cardiology

## 2024-09-05 NOTE — Progress Notes (Signed)
 Remote Loop Recorder Transmission

## 2024-09-12 ENCOUNTER — Other Ambulatory Visit

## 2024-09-12 ENCOUNTER — Ambulatory Visit: Payer: Self-pay | Admitting: Family Medicine

## 2024-09-12 DIAGNOSIS — E7800 Pure hypercholesterolemia, unspecified: Secondary | ICD-10-CM

## 2024-09-12 DIAGNOSIS — N2889 Other specified disorders of kidney and ureter: Secondary | ICD-10-CM

## 2024-09-12 DIAGNOSIS — R5383 Other fatigue: Secondary | ICD-10-CM | POA: Diagnosis not present

## 2024-09-12 LAB — CBC WITH DIFFERENTIAL/PLATELET
Basophils Absolute: 0 K/uL (ref 0.0–0.1)
Basophils Relative: 0.3 % (ref 0.0–3.0)
Eosinophils Absolute: 0.1 K/uL (ref 0.0–0.7)
Eosinophils Relative: 1.9 % (ref 0.0–5.0)
HCT: 49.9 % (ref 39.0–52.0)
Hemoglobin: 16.5 g/dL (ref 13.0–17.0)
Lymphocytes Relative: 14 % (ref 12.0–46.0)
Lymphs Abs: 1 K/uL (ref 0.7–4.0)
MCHC: 33.1 g/dL (ref 30.0–36.0)
MCV: 81.9 fl (ref 78.0–100.0)
Monocytes Absolute: 0.5 K/uL (ref 0.1–1.0)
Monocytes Relative: 6.7 % (ref 3.0–12.0)
Neutro Abs: 5.4 K/uL (ref 1.4–7.7)
Neutrophils Relative %: 77.1 % — ABNORMAL HIGH (ref 43.0–77.0)
Platelets: 227 K/uL (ref 150.0–400.0)
RBC: 6.09 Mil/uL — ABNORMAL HIGH (ref 4.22–5.81)
RDW: 15.6 % — ABNORMAL HIGH (ref 11.5–15.5)
WBC: 7.1 K/uL (ref 4.0–10.5)

## 2024-09-12 LAB — COMPREHENSIVE METABOLIC PANEL WITH GFR
ALT: 11 U/L (ref 3–53)
AST: 13 U/L (ref 5–37)
Albumin: 4.4 g/dL (ref 3.5–5.2)
Alkaline Phosphatase: 90 U/L (ref 39–117)
BUN: 16 mg/dL (ref 6–23)
CO2: 30 meq/L (ref 19–32)
Calcium: 9.4 mg/dL (ref 8.4–10.5)
Chloride: 103 meq/L (ref 96–112)
Creatinine, Ser: 1.33 mg/dL (ref 0.40–1.50)
GFR: 49.44 mL/min — ABNORMAL LOW
Glucose, Bld: 88 mg/dL (ref 70–99)
Potassium: 4 meq/L (ref 3.5–5.1)
Sodium: 139 meq/L (ref 135–145)
Total Bilirubin: 0.8 mg/dL (ref 0.2–1.2)
Total Protein: 6.7 g/dL (ref 6.0–8.3)

## 2024-09-12 LAB — LIPID PANEL
Cholesterol: 106 mg/dL (ref 28–200)
HDL: 34 mg/dL — ABNORMAL LOW
LDL Cholesterol: 50 mg/dL (ref 10–99)
NonHDL: 71.87
Total CHOL/HDL Ratio: 3
Triglycerides: 109 mg/dL (ref 10.0–149.0)
VLDL: 21.8 mg/dL (ref 0.0–40.0)

## 2024-09-12 LAB — IBC + FERRITIN
Ferritin: 20.3 ng/mL — ABNORMAL LOW (ref 22.0–322.0)
Iron: 52 ug/dL (ref 42–165)
Saturation Ratios: 12.9 % — ABNORMAL LOW (ref 20.0–50.0)
TIBC: 404.6 ug/dL (ref 250.0–450.0)
Transferrin: 289 mg/dL (ref 212.0–360.0)

## 2024-09-12 LAB — RENAL FUNCTION PANEL
Albumin: 4.4 g/dL (ref 3.5–5.2)
BUN: 16 mg/dL (ref 6–23)
CO2: 30 meq/L (ref 19–32)
Calcium: 9.4 mg/dL (ref 8.4–10.5)
Chloride: 103 meq/L (ref 96–112)
Creatinine, Ser: 1.33 mg/dL (ref 0.40–1.50)
GFR: 49.44 mL/min — ABNORMAL LOW
Glucose, Bld: 88 mg/dL (ref 70–99)
Phosphorus: 3.3 mg/dL (ref 2.3–4.6)
Potassium: 4 meq/L (ref 3.5–5.1)
Sodium: 139 meq/L (ref 135–145)

## 2024-09-12 LAB — TSH: TSH: 0.62 u[IU]/mL (ref 0.35–5.50)

## 2024-09-12 LAB — HEMOGLOBIN A1C: Hgb A1c MFr Bld: 5.7 % (ref 4.6–6.5)

## 2024-09-12 NOTE — Progress Notes (Signed)
 No critical labs need to be addressed urgently. We will discuss labs in detail at upcoming office visit.

## 2024-09-18 ENCOUNTER — Ambulatory Visit: Admitting: Student

## 2024-09-19 ENCOUNTER — Encounter: Admitting: Family Medicine

## 2024-09-20 ENCOUNTER — Ambulatory Visit: Admitting: Cardiovascular Disease

## 2024-09-21 ENCOUNTER — Encounter: Payer: Self-pay | Admitting: Family Medicine

## 2024-09-21 ENCOUNTER — Ambulatory Visit (INDEPENDENT_AMBULATORY_CARE_PROVIDER_SITE_OTHER): Admitting: Family Medicine

## 2024-09-21 VITALS — BP 114/60 | HR 75 | Temp 98.0°F | Ht 68.75 in | Wt 211.4 lb

## 2024-09-21 DIAGNOSIS — K760 Fatty (change of) liver, not elsewhere classified: Secondary | ICD-10-CM

## 2024-09-21 DIAGNOSIS — E66811 Obesity, class 1: Secondary | ICD-10-CM | POA: Diagnosis not present

## 2024-09-21 DIAGNOSIS — E6609 Other obesity due to excess calories: Secondary | ICD-10-CM | POA: Diagnosis not present

## 2024-09-21 DIAGNOSIS — E7800 Pure hypercholesterolemia, unspecified: Secondary | ICD-10-CM | POA: Diagnosis not present

## 2024-09-21 DIAGNOSIS — I25118 Atherosclerotic heart disease of native coronary artery with other forms of angina pectoris: Secondary | ICD-10-CM | POA: Diagnosis not present

## 2024-09-21 DIAGNOSIS — R42 Dizziness and giddiness: Secondary | ICD-10-CM | POA: Diagnosis not present

## 2024-09-21 DIAGNOSIS — G3184 Mild cognitive impairment, so stated: Secondary | ICD-10-CM

## 2024-09-21 DIAGNOSIS — I10 Essential (primary) hypertension: Secondary | ICD-10-CM

## 2024-09-21 DIAGNOSIS — Z6831 Body mass index (BMI) 31.0-31.9, adult: Secondary | ICD-10-CM | POA: Diagnosis not present

## 2024-09-21 DIAGNOSIS — G4733 Obstructive sleep apnea (adult) (pediatric): Secondary | ICD-10-CM

## 2024-09-21 DIAGNOSIS — E349 Endocrine disorder, unspecified: Secondary | ICD-10-CM

## 2024-09-21 DIAGNOSIS — I6523 Occlusion and stenosis of bilateral carotid arteries: Secondary | ICD-10-CM

## 2024-09-21 DIAGNOSIS — Z23 Encounter for immunization: Secondary | ICD-10-CM

## 2024-09-21 DIAGNOSIS — Z0000 Encounter for general adult medical examination without abnormal findings: Secondary | ICD-10-CM | POA: Diagnosis not present

## 2024-09-21 NOTE — Assessment & Plan Note (Addendum)
 Stable, chronic.  Continue current medication.   lisinopril  40 mg daily , amlodipine  2.5 mg daily, hydralazine   25 mg  2 x daily

## 2024-09-21 NOTE — Assessment & Plan Note (Signed)
"   Improved, has upcoming follow up with Dr. Jesus in next few weeks. "

## 2024-09-21 NOTE — Assessment & Plan Note (Signed)
 Stable, chronic.  Continue current medication.    At goal LDL < 70 on rosuvastatin and fenofibrate

## 2024-09-21 NOTE — Assessment & Plan Note (Signed)
LFTs stable and fatty liver likely resolved with diet changes.

## 2024-09-21 NOTE — Progress Notes (Signed)
 "   Patient ID: Jack Norris, male    DOB: May 30, 1941, 84 y.o.   MRN: [id number]  This visit was conducted in person.  BP 114/60   Pulse 75   Temp 98 F (36.7 C) (Temporal)   Ht 5' 8.75 (1.746 m)   Wt 211 lb 6 oz (95.9 kg)   SpO2 98%   BMI 31.44 kg/m    CC:  Chief Complaint  Patient presents with   Medicare Wellness    Subjective:   HPI: Jack Norris is a 84 y.o. male presenting on 09/21/2024 for Medicare Wellness  The patient presents for annual medicare wellness, complete physical and review of chronic health problems. He/She also has the following acute concerns today:   Doing weight watchers Body mass index is 31.44 kg/m.  Wt Readings from Last 3 Encounters:  09/21/24 211 lb 6 oz (95.9 kg)  07/04/24 225 lb 9.6 oz (102.3 kg)  06/21/24 232 lb (105.2 kg)    Hypertension: Good control on lisinopril  40 mg daily, amlodipine  2.5 mg daily, hydralazine  25 mg daily BP Readings from Last 3 Encounters:  09/21/24 114/60  07/04/24 124/77  06/21/24 138/82  Using medication without problems or lightheadedness:  none Chest pain with exertion:none Edema: mild Short of breath:none Average home BPs: Other issues:  Elevated Cholesterol: LDL at goal less than 70 given coronary artery disease on rosuvastatin  5 mg p.o. daily and fenofibrate  67 mg daily. Lab Results  Component Value Date   CHOL 106 09/12/2024   HDL 34.00 (L) 09/12/2024   LDLCALC 50 09/12/2024   LDLDIRECT 107.6 12/14/2011   TRIG 109.0 09/12/2024   CHOLHDL 3 09/12/2024  Using medications without problems: Muscle aches:  Diet compliance: moderate Exercise:  none Other complaints: Hx of CAD followed by Dr. Viviana at [facility].SABRA started on jardianceor heart health     Mild cognitive impairment: followed by  Dr. Onita in past. MRI of the brain  05/2022: IMPRESSION: 1. No retrocochlear lesion identified. 2. Mild chronic microvascular ischemic changes of the white matter.     Laboratory showed normal TSH,  B12, no treatable etiology identified Recommended exercise no other treatment at this time.   He reports that it is stable in last year.  Relevant past medical, surgical, family and social history reviewed and updated as indicated. Interim medical history since our last visit reviewed. Allergies and medications reviewed and updated. Outpatient Medications Prior to Visit  Medication Sig Dispense Refill   albuterol  (VENTOLIN  HFA) 108 (90 Base) MCG/ACT inhaler Inhale 2 puffs into the lungs every 6 (six) hours as needed for wheezing or shortness of breath. 8 g 2   amLODipine  (NORVASC ) 2.5 MG tablet Take 1 tablet (2.5 mg total) by mouth daily.     aspirin EC 325 MG tablet Take 325 mg by mouth daily.     B-D 3CC LUER-LOK SYR 18GX1-1/2 18G X 1-1/2 3 ML MISC USE ONCE A WEEK AS DIRECTED  2   cholecalciferol (VITAMIN D3) 25 MCG (1000 UNIT) tablet Take 1,000 Units by mouth daily.     clobetasol cream (TEMOVATE) 0.05 % Apply 1 Application topically 2 (two) times daily.     cyanocobalamin  (VITAMIN B12) 1000 MCG tablet Take 1,000 mcg by mouth daily.     diazepam  (VALIUM ) 2 MG tablet Take 1 tablet (2 mg total) by mouth every 8 (eight) hours as needed for anxiety. 30 tablet 3   fenofibrate  micronized (LOFIBRA) 67 MG capsule TAKE 1 CAPSULE(67 MG) BY MOUTH DAILY BEFORE  BREAKFAST 90 capsule 2   hydrALAZINE  (APRESOLINE ) 25 MG tablet Take 1 tablet (25 mg total) by mouth in the morning and at bedtime. 180 tablet 3   JARDIANCE 10 MG TABS tablet Take 10 mg by mouth daily.     lisinopril  (ZESTRIL ) 40 MG tablet TAKE 1 TABLET(40 MG) BY MOUTH DAILY 90 tablet 3   meclizine  (ANTIVERT ) 25 MG tablet Take 1 tablet (25 mg total) by mouth 3 (three) times daily as needed for dizziness. 30 tablet 0   mometasone (ELOCON) 0.1 % cream Apply 1 Application topically daily.     omeprazole (PRILOSEC) 20 MG capsule Take 20 mg by mouth daily. Per patient OTC     psyllium (METAMUCIL) 58.6 % powder Take 1 packet by mouth daily.      rosuvastatin  (CRESTOR ) 40 MG tablet Take 1 tablet (40 mg total) by mouth daily. 90 tablet 3   testosterone  cypionate (DEPOTESTOTERONE CYPIONATE) 100 MG/ML injection Inject 100 mg into the muscle once a week.     triamcinolone  (KENALOG ) 0.025 % cream Apply 1 application topically as needed.     Cholecalciferol (VITAMIN D3) 1.25 MG (50000 UT) CAPS Take 1 capsule (1.25 mg total) by mouth once a week. (Patient not taking: Reported on 07/04/2024) 12 capsule 0   testosterone  cypionate (DEPOTESTOSTERONE CYPIONATE) 200 MG/ML injection Inject 0.5 mLs (100 mg total) into the muscle every 7 (seven) days. 10 mL 0   vitamin B-12 (CYANOCOBALAMIN ) 100 MCG tablet Take 100 mcg by mouth daily.     No facility-administered medications prior to visit.     Per HPI unless specifically indicated in ROS section below Review of Systems  Constitutional:  Negative for fatigue and fever.  HENT:  Negative for ear pain.   Eyes:  Negative for pain.  Respiratory:  Negative for cough and shortness of breath.   Cardiovascular:  Negative for chest pain, palpitations and leg swelling.  Gastrointestinal:  Negative for abdominal pain.  Genitourinary:  Negative for dysuria.  Musculoskeletal:  Negative for arthralgias.  Neurological:  Negative for syncope, light-headedness and headaches.  Psychiatric/Behavioral:  Negative for dysphoric mood.    Objective:  BP 114/60   Pulse 75   Temp 98 F (36.7 C) (Temporal)   Ht 5' 8.75 (1.746 m)   Wt 211 lb 6 oz (95.9 kg)   SpO2 98%   BMI 31.44 kg/m   Wt Readings from Last 3 Encounters:  09/21/24 211 lb 6 oz (95.9 kg)  07/04/24 225 lb 9.6 oz (102.3 kg)  06/21/24 232 lb (105.2 kg)      Physical Exam Vitals reviewed.  Constitutional:      General: He is not in acute distress.    Appearance: Normal appearance. He is well-developed. He is obese. He is not ill-appearing or toxic-appearing.  HENT:     Head: Normocephalic and atraumatic.     Right Ear: Hearing, tympanic membrane,  ear canal and external ear normal.     Left Ear: Hearing, tympanic membrane, ear canal and external ear normal.     Nose: Nose normal.     Mouth/Throat:     Pharynx: Uvula midline.  Eyes:     General: Lids are normal. Lids are everted, no foreign bodies appreciated.     Conjunctiva/sclera: Conjunctivae normal.     Pupils: Pupils are equal, round, and reactive to light.  Neck:     Thyroid : No thyroid  mass or thyromegaly.     Vascular: No carotid bruit.     Trachea: Trachea  and phonation normal.  Cardiovascular:     Rate and Rhythm: Normal rate and regular rhythm.     Pulses: Normal pulses.     Heart sounds: S1 normal and S2 normal. Heart sounds not distant. No murmur heard.    No friction rub. No gallop.     Comments: No peripheral edema Pulmonary:     Effort: Pulmonary effort is normal. No respiratory distress.     Breath sounds: Normal breath sounds. No wheezing, rhonchi or rales.  Abdominal:     General: Bowel sounds are normal.     Palpations: Abdomen is soft.     Tenderness: There is no abdominal tenderness. There is no guarding or rebound.     Hernia: No hernia is present.  Musculoskeletal:     Cervical back: Normal range of motion and neck supple.  Lymphadenopathy:     Cervical: No cervical adenopathy.  Skin:    General: Skin is warm and dry.     Findings: No rash.  Neurological:     Mental Status: He is alert.     Cranial Nerves: No cranial nerve deficit.     Sensory: No sensory deficit.     Gait: Gait normal.     Deep Tendon Reflexes: Reflexes are normal and symmetric.  Psychiatric:        Speech: Speech normal.        Behavior: Behavior normal.        Thought Content: Thought content normal.        Judgment: Judgment normal.       Results for orders placed or performed in visit on 09/12/24  Hemoglobin A1c   Collection Time: 09/12/24  8:14 AM  Result Value Ref Range   Hgb A1c MFr Bld 5.7 4.6 - 6.5 %  Comprehensive metabolic panel   Collection Time:  09/12/24  8:14 AM  Result Value Ref Range   Sodium 139 135 - 145 mEq/L   Potassium 4.0 3.5 - 5.1 mEq/L   Chloride 103 96 - 112 mEq/L   CO2 30 19 - 32 mEq/L   Glucose, Bld 88 70 - 99 mg/dL   BUN 16 6 - 23 mg/dL   Creatinine, Ser 8.66 0.40 - 1.50 mg/dL   Total Bilirubin 0.8 0.2 - 1.2 mg/dL   Alkaline Phosphatase 90 39 - 117 U/L   AST 13 5 - 37 U/L   ALT 11 3 - 53 U/L   Total Protein 6.7 6.0 - 8.3 g/dL   Albumin 4.4 3.5 - 5.2 g/dL   GFR 50.55 (L) >39.99 mL/min   Calcium  9.4 8.4 - 10.5 mg/dL  Lipid panel   Collection Time: 09/12/24  8:14 AM  Result Value Ref Range   Cholesterol 106 28 - 200 mg/dL   Triglycerides 890.9 89.9 - 149.0 mg/dL   HDL 65.99 (L) >60.99 mg/dL   VLDL 78.1 0.0 - 59.9 mg/dL   LDL Cholesterol 50 10 - 99 mg/dL   Total CHOL/HDL Ratio 3    NonHDL 71.87   CBC with Differential/Platelet   Collection Time: 09/12/24  8:14 AM  Result Value Ref Range   WBC 7.1 4.0 - 10.5 K/uL   RBC 6.09 (H) 4.22 - 5.81 Mil/uL   Hemoglobin 16.5 13.0 - 17.0 g/dL   HCT 50.0 60.9 - 47.9 %   MCV 81.9 78.0 - 100.0 fl   MCHC 33.1 30.0 - 36.0 g/dL   RDW 84.3 (H) 88.4 - 84.4 %   Platelets 227.0 150.0 - 400.0 K/uL  Neutrophils Relative % 77.1 (H) 43.0 - 77.0 %   Lymphocytes Relative 14.0 12.0 - 46.0 %   Monocytes Relative 6.7 3.0 - 12.0 %   Eosinophils Relative 1.9 0.0 - 5.0 %   Basophils Relative 0.3 0.0 - 3.0 %   Neutro Abs 5.4 1.4 - 7.7 K/uL   Lymphs Abs 1.0 0.7 - 4.0 K/uL   Monocytes Absolute 0.5 0.1 - 1.0 K/uL   Eosinophils Absolute 0.1 0.0 - 0.7 K/uL   Basophils Absolute 0.0 0.0 - 0.1 K/uL  IBC + Ferritin   Collection Time: 09/12/24  8:14 AM  Result Value Ref Range   Iron 52 42 - 165 ug/dL   Transferrin 710.9 787.9 - 360.0 mg/dL   Saturation Ratios 87.0 (L) 20.0 - 50.0 %   Ferritin 20.3 (L) 22.0 - 322.0 ng/mL   TIBC 404.6 250.0 - 450.0 mcg/dL  TSH   Collection Time: 09/12/24  8:14 AM  Result Value Ref Range   TSH 0.62 0.35 - 5.50 uIU/mL  Renal Function Panel    Collection Time: 09/12/24  8:14 AM  Result Value Ref Range   Sodium 139 135 - 145 mEq/L   Potassium 4.0 3.5 - 5.1 mEq/L   Chloride 103 96 - 112 mEq/L   CO2 30 19 - 32 mEq/L   Albumin 4.4 3.5 - 5.2 g/dL   BUN 16 6 - 23 mg/dL   Creatinine, Ser 8.66 0.40 - 1.50 mg/dL   Glucose, Bld 88 70 - 99 mg/dL   Phosphorus 3.3 2.3 - 4.6 mg/dL   GFR 50.55 (L) >39.99 mL/min   Calcium  9.4 8.4 - 10.5 mg/dL     COVID 19 screen:  No recent travel or known exposure to COVID19 The patient denies respiratory symptoms of COVID 19 at this time. The importance of social distancing was discussed today.   Assessment and Plan   The patient's preventative maintenance and recommended screening tests for an annual wellness exam were reviewed in full today. Brought up to date unless services declined.  Counselled on the importance of diet, exercise, and its role in overall health and mortality. The patient's FH and SH was reviewed, including their home life, tobacco status, and drug and alcohol status.   Vaccines: Uptodate with Tdap, given flu, COVID x 3 , PNA, shingrix Colon: 05/2013, [facility] nml, no further indicated.   Prostate: per uro. Stable PSA per  Dr.  Loleta. Former smoker, remotely.    Hep C  done.  Problem List Items Addressed This Visit     Benign essential hypertension (Chronic)   Stable, chronic.  Continue current medication.   lisinopril  40 mg daily , amlodipine  2.5 mg daily, hydralazine   25 mg  2 x daily      CAD (coronary artery disease)    On  jardiance      Carotid stenosis    NORMAL in 2024      Class 1 obesity due to excess calories with body mass index (BMI) of 31.0 to 31.9 in adult   Encouraged exercise, weight loss, healthy eating habits.       Relevant Medications   JARDIANCE 10 MG TABS tablet   Fatty liver disease, nonalcoholic   LFTs stable and fatty liver likely resolved with diet changes.      HYPERCHOLESTEROLEMIA (Chronic)   Stable, chronic.  Continue current  medication.      At goal LDL < 70 on rosuvastatin  and fenofibrate       Mild cognitive impairment with memory loss  Stable per patient and last year.  Has been seen by neurology in the past.      OSA (obstructive sleep apnea)    Followed by pulmonary  On CPAp      Testosterone  deficiency (Chronic)   Followed by [facility]      Vertigo    Improved, has upcoming follow up with Dr. Jesus in next few weeks.      Other Visit Diagnoses       Medicare annual wellness visit, subsequent    -  Primary     Need for influenza vaccination       Relevant Orders   Flu vaccine HIGH DOSE PF(Fluzone Trivalent) (Completed)          Greig Ring, MD   "

## 2024-09-21 NOTE — Assessment & Plan Note (Signed)
"   Followed by pulmonary  On CPAp "

## 2024-09-21 NOTE — Assessment & Plan Note (Signed)
 Encouraged exercise, weight loss, healthy eating habits. ? ?

## 2024-09-21 NOTE — Assessment & Plan Note (Signed)
 Followed by [facility].

## 2024-09-21 NOTE — Assessment & Plan Note (Addendum)
 On jardiance.

## 2024-09-21 NOTE — Assessment & Plan Note (Signed)
 Stable per patient and last year.  Has been seen by neurology in the past.

## 2024-09-21 NOTE — Assessment & Plan Note (Signed)
"   NORMAL in 2024 "

## 2024-10-03 ENCOUNTER — Encounter

## 2024-10-04 ENCOUNTER — Encounter

## 2024-11-04 ENCOUNTER — Ambulatory Visit

## 2024-12-05 ENCOUNTER — Ambulatory Visit

## 2025-01-05 ENCOUNTER — Ambulatory Visit

## 2025-02-05 ENCOUNTER — Ambulatory Visit

## 2025-02-08 ENCOUNTER — Ambulatory Visit

## 2025-03-08 ENCOUNTER — Ambulatory Visit

## 2025-04-08 ENCOUNTER — Ambulatory Visit

## 2025-05-09 ENCOUNTER — Ambulatory Visit

## 2025-06-09 ENCOUNTER — Ambulatory Visit

## 2025-07-10 ENCOUNTER — Ambulatory Visit

## 2025-08-10 ENCOUNTER — Ambulatory Visit

## 2025-09-10 ENCOUNTER — Ambulatory Visit

## 2025-09-18 ENCOUNTER — Other Ambulatory Visit

## 2025-09-25 ENCOUNTER — Encounter: Admitting: Family Medicine
